# Patient Record
Sex: Male | Born: 1935 | Race: White | Hispanic: No | Marital: Married
Health system: Southern US, Community
[De-identification: ages and names within clinical notes are randomized; demographics above are authoritative.]

## PROBLEM LIST (undated history)

## (undated) DIAGNOSIS — N400 Enlarged prostate without lower urinary tract symptoms: Secondary | ICD-10-CM

## (undated) DIAGNOSIS — I10 Essential (primary) hypertension: Secondary | ICD-10-CM

## (undated) DIAGNOSIS — F0390 Unspecified dementia without behavioral disturbance: Secondary | ICD-10-CM

## (undated) DIAGNOSIS — E785 Hyperlipidemia, unspecified: Secondary | ICD-10-CM

## (undated) DIAGNOSIS — M1990 Unspecified osteoarthritis, unspecified site: Secondary | ICD-10-CM

## (undated) DIAGNOSIS — I2510 Atherosclerotic heart disease of native coronary artery without angina pectoris: Secondary | ICD-10-CM

## (undated) DIAGNOSIS — K219 Gastro-esophageal reflux disease without esophagitis: Secondary | ICD-10-CM

## (undated) DIAGNOSIS — R296 Repeated falls: Secondary | ICD-10-CM

## (undated) DIAGNOSIS — F32A Depression, unspecified: Secondary | ICD-10-CM

## (undated) DIAGNOSIS — R9430 Abnormal result of cardiovascular function study, unspecified: Secondary | ICD-10-CM

## (undated) DIAGNOSIS — R202 Paresthesia of skin: Secondary | ICD-10-CM

## (undated) DIAGNOSIS — R001 Bradycardia, unspecified: Secondary | ICD-10-CM

## (undated) DIAGNOSIS — L989 Disorder of the skin and subcutaneous tissue, unspecified: Secondary | ICD-10-CM

## (undated) DIAGNOSIS — H9190 Unspecified hearing loss, unspecified ear: Secondary | ICD-10-CM

## (undated) DIAGNOSIS — M5090 Cervical disc disorder, unspecified, unspecified cervical region: Secondary | ICD-10-CM

## (undated) DIAGNOSIS — I779 Disorder of arteries and arterioles, unspecified: Secondary | ICD-10-CM

## (undated) DIAGNOSIS — K2270 Barrett's esophagus without dysplasia: Secondary | ICD-10-CM

## (undated) DIAGNOSIS — S065XAA Traumatic subdural hemorrhage with loss of consciousness status unknown, initial encounter: Secondary | ICD-10-CM

## (undated) DIAGNOSIS — S065X9A Traumatic subdural hemorrhage with loss of consciousness of unspecified duration, initial encounter: Secondary | ICD-10-CM

## (undated) DIAGNOSIS — Z951 Presence of aortocoronary bypass graft: Secondary | ICD-10-CM

## (undated) DIAGNOSIS — Z9289 Personal history of other medical treatment: Secondary | ICD-10-CM

## (undated) DIAGNOSIS — R55 Syncope and collapse: Secondary | ICD-10-CM

## (undated) DIAGNOSIS — N419 Inflammatory disease of prostate, unspecified: Secondary | ICD-10-CM

## (undated) DIAGNOSIS — I739 Peripheral vascular disease, unspecified: Secondary | ICD-10-CM

## (undated) DIAGNOSIS — K5790 Diverticulosis of intestine, part unspecified, without perforation or abscess without bleeding: Secondary | ICD-10-CM

## (undated) DIAGNOSIS — W19XXXA Unspecified fall, initial encounter: Secondary | ICD-10-CM

## (undated) DIAGNOSIS — I440 Atrioventricular block, first degree: Secondary | ICD-10-CM

## (undated) DIAGNOSIS — L719 Rosacea, unspecified: Secondary | ICD-10-CM

## (undated) DIAGNOSIS — N320 Bladder-neck obstruction: Secondary | ICD-10-CM

## (undated) DIAGNOSIS — R0989 Other specified symptoms and signs involving the circulatory and respiratory systems: Secondary | ICD-10-CM

## (undated) DIAGNOSIS — S060X9A Concussion with loss of consciousness of unspecified duration, initial encounter: Secondary | ICD-10-CM

## (undated) DIAGNOSIS — Y92009 Unspecified place in unspecified non-institutional (private) residence as the place of occurrence of the external cause: Secondary | ICD-10-CM

## (undated) DIAGNOSIS — F329 Major depressive disorder, single episode, unspecified: Secondary | ICD-10-CM

## (undated) HISTORY — PX: OTHER SURGICAL HISTORY: SHX169

## (undated) HISTORY — DX: Depression, unspecified: F32.A

## (undated) HISTORY — DX: Peripheral vascular disease, unspecified: I73.9

## (undated) HISTORY — DX: Other specified symptoms and signs involving the circulatory and respiratory systems: R09.89

## (undated) HISTORY — DX: Disorder of arteries and arterioles, unspecified: I77.9

## (undated) HISTORY — DX: Diverticulosis of intestine, part unspecified, without perforation or abscess without bleeding: K57.90

## (undated) HISTORY — DX: Disorder of the skin and subcutaneous tissue, unspecified: L98.9

## (undated) HISTORY — DX: Traumatic subdural hemorrhage with loss of consciousness of unspecified duration, initial encounter: S06.5X9A

## (undated) HISTORY — DX: Rosacea, unspecified: L71.9

## (undated) HISTORY — DX: Benign prostatic hyperplasia without lower urinary tract symptoms: N40.0

## (undated) HISTORY — DX: Barrett's esophagus without dysplasia: K22.70

## (undated) HISTORY — DX: Concussion with loss of consciousness of unspecified duration, initial encounter: S06.0X9A

## (undated) HISTORY — PX: LEG SURGERY: SHX1003

## (undated) HISTORY — DX: Unspecified fall, initial encounter: W19.XXXA

## (undated) HISTORY — PX: CORONARY ANGIOPLASTY WITH STENT PLACEMENT: SHX49

## (undated) HISTORY — DX: Hyperlipidemia, unspecified: E78.5

## (undated) HISTORY — PX: APPENDECTOMY: SHX54

## (undated) HISTORY — PX: TONSILLECTOMY: SUR1361

## (undated) HISTORY — DX: Unspecified osteoarthritis, unspecified site: M19.90

## (undated) HISTORY — DX: Unspecified place in unspecified non-institutional (private) residence as the place of occurrence of the external cause: Y92.009

## (undated) HISTORY — DX: Unspecified hearing loss, unspecified ear: H91.90

## (undated) HISTORY — DX: Personal history of other medical treatment: Z92.89

## (undated) HISTORY — DX: Traumatic subdural hemorrhage with loss of consciousness status unknown, initial encounter: S06.5XAA

## (undated) HISTORY — DX: Atherosclerotic heart disease of native coronary artery without angina pectoris: I25.10

## (undated) HISTORY — DX: Syncope and collapse: R55

## (undated) HISTORY — DX: Presence of aortocoronary bypass graft: Z95.1

## (undated) HISTORY — DX: Inflammatory disease of prostate, unspecified: N41.9

## (undated) HISTORY — DX: Gastro-esophageal reflux disease without esophagitis: K21.9

## (undated) HISTORY — DX: Paresthesia of skin: R20.2

## (undated) HISTORY — DX: Abnormal result of cardiovascular function study, unspecified: R94.30

## (undated) HISTORY — DX: Major depressive disorder, single episode, unspecified: F32.9

## (undated) HISTORY — DX: Bladder-neck obstruction: N32.0

---

## 1990-04-12 HISTORY — PX: CORONARY ARTERY BYPASS GRAFT: SHX141

## 1995-04-13 HISTORY — PX: COCCYX FRACTURE SURGERY: SHX599

## 1998-07-24 ENCOUNTER — Encounter: Payer: Self-pay | Admitting: Internal Medicine

## 1998-07-24 ENCOUNTER — Ambulatory Visit (HOSPITAL_COMMUNITY): Admission: RE | Admit: 1998-07-24 | Discharge: 1998-07-24 | Payer: Self-pay | Admitting: Internal Medicine

## 1998-10-22 ENCOUNTER — Encounter (INDEPENDENT_AMBULATORY_CARE_PROVIDER_SITE_OTHER): Payer: Self-pay | Admitting: Specialist

## 1998-10-22 ENCOUNTER — Other Ambulatory Visit: Admission: RE | Admit: 1998-10-22 | Discharge: 1998-10-22 | Payer: Self-pay | Admitting: Gastroenterology

## 1998-10-27 ENCOUNTER — Encounter: Payer: Self-pay | Admitting: Gastroenterology

## 1998-10-27 ENCOUNTER — Ambulatory Visit (HOSPITAL_COMMUNITY): Admission: RE | Admit: 1998-10-27 | Discharge: 1998-10-27 | Payer: Self-pay | Admitting: Gastroenterology

## 1999-06-15 ENCOUNTER — Encounter: Payer: Self-pay | Admitting: Cardiology

## 1999-07-20 ENCOUNTER — Ambulatory Visit: Admission: RE | Admit: 1999-07-20 | Discharge: 1999-07-20 | Payer: Self-pay | Admitting: Pulmonary Disease

## 2002-02-17 ENCOUNTER — Encounter: Payer: Self-pay | Admitting: Specialist

## 2002-02-17 ENCOUNTER — Encounter: Payer: Self-pay | Admitting: Emergency Medicine

## 2002-02-18 ENCOUNTER — Inpatient Hospital Stay (HOSPITAL_COMMUNITY): Admission: EM | Admit: 2002-02-18 | Discharge: 2002-02-20 | Payer: Self-pay | Admitting: Emergency Medicine

## 2002-11-19 ENCOUNTER — Ambulatory Visit (HOSPITAL_COMMUNITY): Admission: RE | Admit: 2002-11-19 | Discharge: 2002-11-19 | Payer: Self-pay | Admitting: Specialist

## 2002-11-26 LAB — HM COLONOSCOPY

## 2004-02-24 ENCOUNTER — Ambulatory Visit (HOSPITAL_COMMUNITY): Admission: RE | Admit: 2004-02-24 | Discharge: 2004-02-24 | Payer: Self-pay | Admitting: Chiropractic Medicine

## 2004-03-02 ENCOUNTER — Ambulatory Visit: Payer: Self-pay | Admitting: Pulmonary Disease

## 2004-03-11 ENCOUNTER — Ambulatory Visit: Payer: Self-pay | Admitting: Internal Medicine

## 2004-04-08 ENCOUNTER — Ambulatory Visit: Payer: Self-pay | Admitting: Internal Medicine

## 2004-10-05 ENCOUNTER — Ambulatory Visit: Payer: Self-pay | Admitting: Internal Medicine

## 2004-10-15 ENCOUNTER — Ambulatory Visit: Payer: Self-pay

## 2004-10-27 ENCOUNTER — Ambulatory Visit: Payer: Self-pay | Admitting: Cardiology

## 2004-11-03 ENCOUNTER — Ambulatory Visit: Payer: Self-pay

## 2004-11-19 ENCOUNTER — Ambulatory Visit: Payer: Self-pay | Admitting: Internal Medicine

## 2005-04-02 ENCOUNTER — Ambulatory Visit: Payer: Self-pay | Admitting: Pulmonary Disease

## 2005-07-12 ENCOUNTER — Inpatient Hospital Stay (HOSPITAL_COMMUNITY): Admission: EM | Admit: 2005-07-12 | Discharge: 2005-07-15 | Payer: Self-pay | Admitting: Emergency Medicine

## 2005-07-12 ENCOUNTER — Ambulatory Visit: Payer: Self-pay | Admitting: Cardiology

## 2005-07-13 ENCOUNTER — Encounter: Payer: Self-pay | Admitting: Cardiology

## 2005-08-09 ENCOUNTER — Ambulatory Visit: Payer: Self-pay | Admitting: Cardiology

## 2005-09-21 ENCOUNTER — Ambulatory Visit: Payer: Self-pay | Admitting: Cardiology

## 2005-11-10 ENCOUNTER — Ambulatory Visit: Payer: Self-pay | Admitting: Cardiology

## 2006-03-08 ENCOUNTER — Ambulatory Visit: Payer: Self-pay | Admitting: Cardiology

## 2006-03-12 HISTORY — PX: OTHER SURGICAL HISTORY: SHX169

## 2006-03-22 ENCOUNTER — Ambulatory Visit (HOSPITAL_COMMUNITY): Admission: RE | Admit: 2006-03-22 | Discharge: 2006-03-23 | Payer: Self-pay | Admitting: Neurosurgery

## 2006-04-15 ENCOUNTER — Ambulatory Visit: Payer: Self-pay | Admitting: Internal Medicine

## 2006-05-03 ENCOUNTER — Ambulatory Visit: Payer: Self-pay | Admitting: Cardiology

## 2006-05-03 ENCOUNTER — Ambulatory Visit: Payer: Self-pay | Admitting: Internal Medicine

## 2006-05-03 LAB — CONVERTED CEMR LAB
Cholesterol: 127 mg/dL (ref 0–200)
HDL: 32.2 mg/dL — ABNORMAL LOW (ref >39.0)
LDL Cholesterol: 72 mg/dL (ref 0–99)
PSA: 0.95 ng/mL (ref 0.10–4.00)
Total CHOL/HDL Ratio: 3.9
Triglycerides: 114 mg/dL (ref 0–149)
VLDL: 23 mg/dL (ref 0–40)

## 2006-05-12 ENCOUNTER — Encounter: Admission: RE | Admit: 2006-05-12 | Discharge: 2006-05-12 | Payer: Self-pay | Admitting: Neurosurgery

## 2006-09-02 ENCOUNTER — Ambulatory Visit: Payer: Self-pay | Admitting: Cardiology

## 2007-03-01 ENCOUNTER — Ambulatory Visit: Payer: Self-pay | Admitting: Cardiology

## 2007-03-01 LAB — CONVERTED CEMR LAB
Cholesterol: 147 mg/dL (ref 0–200)
HDL: 32.3 mg/dL — ABNORMAL LOW (ref >39.0)
LDL Cholesterol: 91 mg/dL (ref 0–99)
Total CHOL/HDL Ratio: 4.6
Triglycerides: 118 mg/dL (ref 0–149)
VLDL: 24 mg/dL (ref 0–40)

## 2007-05-23 ENCOUNTER — Emergency Department (HOSPITAL_COMMUNITY): Admission: EM | Admit: 2007-05-23 | Discharge: 2007-05-23 | Payer: Self-pay | Admitting: Emergency Medicine

## 2007-06-02 ENCOUNTER — Ambulatory Visit: Payer: Self-pay | Admitting: Internal Medicine

## 2007-06-02 DIAGNOSIS — E785 Hyperlipidemia, unspecified: Secondary | ICD-10-CM | POA: Insufficient documentation

## 2007-06-02 DIAGNOSIS — S0100XA Unspecified open wound of scalp, initial encounter: Secondary | ICD-10-CM | POA: Insufficient documentation

## 2007-06-02 DIAGNOSIS — K2270 Barrett's esophagus without dysplasia: Secondary | ICD-10-CM

## 2007-06-02 DIAGNOSIS — E739 Lactose intolerance, unspecified: Secondary | ICD-10-CM | POA: Insufficient documentation

## 2007-06-02 DIAGNOSIS — L989 Disorder of the skin and subcutaneous tissue, unspecified: Secondary | ICD-10-CM

## 2007-06-02 DIAGNOSIS — R5381 Other malaise: Secondary | ICD-10-CM | POA: Insufficient documentation

## 2007-06-02 DIAGNOSIS — R5383 Other fatigue: Secondary | ICD-10-CM

## 2007-06-02 DIAGNOSIS — L719 Rosacea, unspecified: Secondary | ICD-10-CM | POA: Insufficient documentation

## 2007-06-02 HISTORY — DX: Barrett's esophagus without dysplasia: K22.70

## 2007-06-02 HISTORY — DX: Disorder of the skin and subcutaneous tissue, unspecified: L98.9

## 2007-06-02 LAB — CONVERTED CEMR LAB
ALT: 23 units/L (ref 0–53)
AST: 20 units/L (ref 0–37)
Albumin: 4.1 g/dL (ref 3.5–5.2)
Alkaline Phosphatase: 45 units/L (ref 39–117)
BUN: 16 mg/dL (ref 6–23)
Basophils Absolute: 0.1 10*3/uL (ref 0.0–0.1)
Basophils Relative: 1 % (ref 0.0–1.0)
Bilirubin, Direct: 0.2 mg/dL (ref 0.0–0.3)
CO2: 31 meq/L (ref 19–32)
Calcium: 9.2 mg/dL (ref 8.4–10.5)
Chloride: 104 meq/L (ref 96–112)
Cholesterol: 139 mg/dL (ref 0–200)
Creatinine, Ser: 1.3 mg/dL (ref 0.4–1.5)
Eosinophils Absolute: 0.2 10*3/uL (ref 0.0–0.6)
Eosinophils Relative: 3 % (ref 0.0–5.0)
GFR calc Af Amer: 70 mL/min
GFR calc non Af Amer: 58 mL/min
Glucose, Bld: 146 mg/dL — ABNORMAL HIGH (ref 70–99)
HCT: 48 % (ref 39.0–52.0)
HDL: 25.4 mg/dL — ABNORMAL LOW (ref >39.0)
Hemoglobin: 16.1 g/dL (ref 13.0–17.0)
Hgb A1c MFr Bld: 6.7 % — ABNORMAL HIGH (ref 4.6–6.0)
LDL Cholesterol: 85 mg/dL (ref 0–99)
Lymphocytes Relative: 31.6 % (ref 12.0–46.0)
MCHC: 33.7 g/dL (ref 30.0–36.0)
MCV: 95.4 fL (ref 78.0–100.0)
Monocytes Absolute: 0.6 10*3/uL (ref 0.2–0.7)
Monocytes Relative: 7.1 % (ref 3.0–11.0)
Neutro Abs: 4.7 10*3/uL (ref 1.4–7.7)
Neutrophils Relative %: 57.3 % (ref 43.0–77.0)
PSA: 1.48 ng/mL (ref 0.10–4.00)
Platelets: 184 10*3/uL (ref 150–400)
Potassium: 4.2 meq/L (ref 3.5–5.1)
RBC: 5.03 M/uL (ref 4.22–5.81)
RDW: 12.5 % (ref 11.5–14.6)
Sodium: 140 meq/L (ref 135–145)
TSH: 1.55 microintl units/mL (ref 0.35–5.50)
Total Bilirubin: 1 mg/dL (ref 0.3–1.2)
Total CHOL/HDL Ratio: 5.5
Total Protein: 6.9 g/dL (ref 6.0–8.3)
Triglycerides: 144 mg/dL (ref 0–149)
VLDL: 29 mg/dL (ref 0–40)
WBC: 8.2 10*3/uL (ref 4.5–10.5)

## 2007-06-04 DIAGNOSIS — N400 Enlarged prostate without lower urinary tract symptoms: Secondary | ICD-10-CM | POA: Insufficient documentation

## 2007-06-04 DIAGNOSIS — K219 Gastro-esophageal reflux disease without esophagitis: Secondary | ICD-10-CM | POA: Insufficient documentation

## 2007-06-04 DIAGNOSIS — J45909 Unspecified asthma, uncomplicated: Secondary | ICD-10-CM | POA: Insufficient documentation

## 2007-06-04 DIAGNOSIS — F329 Major depressive disorder, single episode, unspecified: Secondary | ICD-10-CM | POA: Insufficient documentation

## 2007-06-04 DIAGNOSIS — F32A Depression, unspecified: Secondary | ICD-10-CM | POA: Insufficient documentation

## 2007-06-28 ENCOUNTER — Ambulatory Visit: Payer: Self-pay | Admitting: Internal Medicine

## 2007-07-03 ENCOUNTER — Telehealth (INDEPENDENT_AMBULATORY_CARE_PROVIDER_SITE_OTHER): Payer: Self-pay | Admitting: *Deleted

## 2007-07-04 ENCOUNTER — Ambulatory Visit: Payer: Self-pay | Admitting: Internal Medicine

## 2007-07-04 DIAGNOSIS — J209 Acute bronchitis, unspecified: Secondary | ICD-10-CM | POA: Insufficient documentation

## 2007-08-15 ENCOUNTER — Encounter: Payer: Self-pay | Admitting: Internal Medicine

## 2007-09-08 ENCOUNTER — Ambulatory Visit: Payer: Self-pay | Admitting: Cardiology

## 2008-03-28 ENCOUNTER — Encounter: Payer: Self-pay | Admitting: Cardiology

## 2008-03-29 ENCOUNTER — Ambulatory Visit: Payer: Self-pay | Admitting: Cardiology

## 2008-05-08 ENCOUNTER — Ambulatory Visit: Payer: Self-pay | Admitting: Cardiology

## 2008-05-08 LAB — CONVERTED CEMR LAB
ALT: 18 units/L (ref 0–53)
AST: 20 units/L (ref 0–37)
Albumin: 3.9 g/dL (ref 3.5–5.2)
Alkaline Phosphatase: 44 units/L (ref 39–117)
Bilirubin, Direct: 0.2 mg/dL (ref 0.0–0.3)
Cholesterol: 205 mg/dL (ref 0–200)
Direct LDL: 150.5 mg/dL
HDL: 28.6 mg/dL — ABNORMAL LOW (ref >39.0)
Total Bilirubin: 1.2 mg/dL (ref 0.3–1.2)
Total CHOL/HDL Ratio: 7.2
Total Protein: 7 g/dL (ref 6.0–8.3)
Triglycerides: 163 mg/dL — ABNORMAL HIGH (ref 0–149)
VLDL: 33 mg/dL (ref 0–40)

## 2008-07-15 ENCOUNTER — Ambulatory Visit: Payer: Self-pay | Admitting: Cardiology

## 2008-07-15 LAB — CONVERTED CEMR LAB
ALT: 22 units/L (ref 0–53)
AST: 24 units/L (ref 0–37)
Albumin: 4 g/dL (ref 3.5–5.2)
Alkaline Phosphatase: 39 units/L (ref 39–117)
Bilirubin, Direct: 0.2 mg/dL (ref 0.0–0.3)
Cholesterol: 139 mg/dL (ref 0–200)
HDL: 25.3 mg/dL — ABNORMAL LOW (ref >39.00)
LDL Cholesterol: 87 mg/dL (ref 0–99)
Total Bilirubin: 1.2 mg/dL (ref 0.3–1.2)
Total CHOL/HDL Ratio: 5
Total Protein: 6.9 g/dL (ref 6.0–8.3)
Triglycerides: 135 mg/dL (ref 0.0–149.0)
VLDL: 27 mg/dL (ref 0.0–40.0)

## 2008-08-07 ENCOUNTER — Telehealth: Payer: Self-pay | Admitting: Cardiology

## 2008-09-16 ENCOUNTER — Telehealth: Payer: Self-pay | Admitting: Internal Medicine

## 2008-09-18 ENCOUNTER — Encounter: Payer: Self-pay | Admitting: Internal Medicine

## 2008-09-19 ENCOUNTER — Ambulatory Visit: Payer: Self-pay | Admitting: Internal Medicine

## 2008-09-19 ENCOUNTER — Telehealth (INDEPENDENT_AMBULATORY_CARE_PROVIDER_SITE_OTHER): Payer: Self-pay | Admitting: *Deleted

## 2008-09-19 LAB — CONVERTED CEMR LAB
ALT: 28 units/L (ref 0–53)
AST: 31 units/L (ref 0–37)
Albumin: 4.2 g/dL (ref 3.5–5.2)
Alkaline Phosphatase: 59 units/L (ref 39–117)
BUN: 17 mg/dL (ref 6–23)
Basophils Absolute: 0.4 10*3/uL — ABNORMAL HIGH (ref 0.0–0.1)
Basophils Relative: 3.8 % — ABNORMAL HIGH (ref 0.0–3.0)
Bilirubin, Direct: 0.2 mg/dL (ref 0.0–0.3)
CO2: 28 meq/L (ref 19–32)
Calcium: 9.1 mg/dL (ref 8.4–10.5)
Chloride: 106 meq/L (ref 96–112)
Cholesterol: 134 mg/dL (ref 0–200)
Creatinine, Ser: 1.2 mg/dL (ref 0.4–1.5)
Direct LDL: 87.6 mg/dL
Eosinophils Absolute: 0.6 10*3/uL (ref 0.0–0.7)
Eosinophils Relative: 5.7 % — ABNORMAL HIGH (ref 0.0–5.0)
GFR calc non Af Amer: 63.01 mL/min (ref >60)
Glucose, Bld: 134 mg/dL — ABNORMAL HIGH (ref 70–99)
HCT: 45.7 % (ref 39.0–52.0)
HDL: 29.5 mg/dL — ABNORMAL LOW (ref >39.00)
Hemoglobin: 16.2 g/dL (ref 13.0–17.0)
Hgb A1c MFr Bld: 6.8 % — ABNORMAL HIGH (ref 4.6–6.5)
Lymphocytes Relative: 39.9 % (ref 12.0–46.0)
Lymphs Abs: 4.1 10*3/uL — ABNORMAL HIGH (ref 0.7–4.0)
MCHC: 35.4 g/dL (ref 30.0–36.0)
MCV: 97.6 fL (ref 78.0–100.0)
Monocytes Absolute: 0.8 10*3/uL (ref 0.1–1.0)
Monocytes Relative: 8.1 % (ref 3.0–12.0)
Neutro Abs: 4.5 10*3/uL (ref 1.4–7.7)
Neutrophils Relative %: 42.5 % — ABNORMAL LOW (ref 43.0–77.0)
PSA: 1.23 ng/mL (ref 0.10–4.00)
Platelets: 189 10*3/uL (ref 150.0–400.0)
Potassium: 3.8 meq/L (ref 3.5–5.1)
RBC: 4.68 M/uL (ref 4.22–5.81)
RDW: 12.3 % (ref 11.5–14.6)
Sodium: 143 meq/L (ref 135–145)
TSH: 1.74 microintl units/mL (ref 0.35–5.50)
Total Bilirubin: 0.9 mg/dL (ref 0.3–1.2)
Total CHOL/HDL Ratio: 5
Total Protein: 7.4 g/dL (ref 6.0–8.3)
Triglycerides: 290 mg/dL — ABNORMAL HIGH (ref 0.0–149.0)
VLDL: 58 mg/dL — ABNORMAL HIGH (ref 0.0–40.0)
WBC: 10.4 10*3/uL (ref 4.5–10.5)

## 2008-10-24 ENCOUNTER — Ambulatory Visit: Payer: Self-pay | Admitting: Cardiology

## 2008-12-20 ENCOUNTER — Encounter: Admission: RE | Admit: 2008-12-20 | Discharge: 2008-12-20 | Payer: Self-pay | Admitting: Neurosurgery

## 2008-12-20 ENCOUNTER — Encounter: Payer: Self-pay | Admitting: Internal Medicine

## 2009-01-01 ENCOUNTER — Encounter: Payer: Self-pay | Admitting: Cardiology

## 2009-01-10 HISTORY — PX: OTHER SURGICAL HISTORY: SHX169

## 2009-01-13 ENCOUNTER — Inpatient Hospital Stay (HOSPITAL_COMMUNITY): Admission: RE | Admit: 2009-01-13 | Discharge: 2009-01-14 | Payer: Self-pay | Admitting: Neurosurgery

## 2009-03-13 ENCOUNTER — Encounter: Admission: RE | Admit: 2009-03-13 | Discharge: 2009-03-13 | Payer: Self-pay | Admitting: Neurosurgery

## 2009-10-02 ENCOUNTER — Ambulatory Visit: Payer: Self-pay | Admitting: Internal Medicine

## 2009-10-02 DIAGNOSIS — E119 Type 2 diabetes mellitus without complications: Secondary | ICD-10-CM | POA: Insufficient documentation

## 2009-10-03 ENCOUNTER — Encounter (INDEPENDENT_AMBULATORY_CARE_PROVIDER_SITE_OTHER): Payer: Self-pay | Admitting: *Deleted

## 2009-10-03 LAB — CONVERTED CEMR LAB
ALT: 20 units/L (ref 0–53)
AST: 21 units/L (ref 0–37)
Albumin: 4.2 g/dL (ref 3.5–5.2)
Alkaline Phosphatase: 57 units/L (ref 39–117)
BUN: 16 mg/dL (ref 6–23)
Basophils Absolute: 0.1 10*3/uL (ref 0.0–0.1)
Basophils Relative: 0.6 % (ref 0.0–3.0)
Bilirubin Urine: NEGATIVE
Bilirubin, Direct: 0.1 mg/dL (ref 0.0–0.3)
CO2: 29 meq/L (ref 19–32)
Calcium: 9 mg/dL (ref 8.4–10.5)
Chloride: 107 meq/L (ref 96–112)
Cholesterol: 134 mg/dL (ref 0–200)
Creatinine, Ser: 1.1 mg/dL (ref 0.4–1.5)
Eosinophils Absolute: 0.4 10*3/uL (ref 0.0–0.7)
Eosinophils Relative: 4.1 % (ref 0.0–5.0)
Folate: 6.5 ng/mL
GFR calc non Af Amer: 68.04 mL/min (ref >60)
Glucose, Bld: 131 mg/dL — ABNORMAL HIGH (ref 70–99)
HCT: 46.1 % (ref 39.0–52.0)
HDL: 33.6 mg/dL — ABNORMAL LOW (ref >39.00)
Hemoglobin, Urine: NEGATIVE
Hemoglobin: 15.9 g/dL (ref 13.0–17.0)
Hgb A1c MFr Bld: 6.9 % — ABNORMAL HIGH (ref 4.6–6.5)
Iron: 95 ug/dL (ref 42–165)
Ketones, ur: NEGATIVE mg/dL
LDL Cholesterol: 74 mg/dL (ref 0–99)
Leukocytes, UA: NEGATIVE
Lymphocytes Relative: 39.1 % (ref 12.0–46.0)
Lymphs Abs: 3.8 10*3/uL (ref 0.7–4.0)
MCHC: 34.4 g/dL (ref 30.0–36.0)
MCV: 98.3 fL (ref 78.0–100.0)
Monocytes Absolute: 0.8 10*3/uL (ref 0.1–1.0)
Monocytes Relative: 8.3 % (ref 3.0–12.0)
Neutro Abs: 4.7 10*3/uL (ref 1.4–7.7)
Neutrophils Relative %: 47.9 % (ref 43.0–77.0)
Nitrite: NEGATIVE
PSA: 1.23 ng/mL (ref 0.10–4.00)
Platelets: 194 10*3/uL (ref 150.0–400.0)
Potassium: 4.2 meq/L (ref 3.5–5.1)
RBC: 4.69 M/uL (ref 4.22–5.81)
RDW: 13.2 % (ref 11.5–14.6)
Saturation Ratios: 29.8 % (ref 20.0–50.0)
Sed Rate: 7 mm/hr (ref 0–22)
Sodium: 141 meq/L (ref 135–145)
Specific Gravity, Urine: 1.02 (ref 1.000–1.030)
TSH: 2.84 microintl units/mL (ref 0.35–5.50)
Total Bilirubin: 0.9 mg/dL (ref 0.3–1.2)
Total CHOL/HDL Ratio: 4
Total Protein, Urine: NEGATIVE mg/dL
Total Protein: 7.2 g/dL (ref 6.0–8.3)
Transferrin: 227.5 mg/dL (ref 212.0–360.0)
Triglycerides: 131 mg/dL (ref 0.0–149.0)
Urine Glucose: NEGATIVE mg/dL
Urobilinogen, UA: 0.2 (ref 0.0–1.0)
VLDL: 26.2 mg/dL (ref 0.0–40.0)
Vitamin B-12: 396 pg/mL (ref 211–911)
WBC: 9.8 10*3/uL (ref 4.5–10.5)
pH: 7 (ref 5.0–8.0)

## 2009-10-04 ENCOUNTER — Encounter: Payer: Self-pay | Admitting: Internal Medicine

## 2009-10-05 LAB — CONVERTED CEMR LAB: Vit D, 25-Hydroxy: 21 ng/mL — ABNORMAL LOW (ref 30–89)

## 2009-10-13 ENCOUNTER — Encounter: Payer: Self-pay | Admitting: Cardiology

## 2009-10-14 ENCOUNTER — Ambulatory Visit: Payer: Self-pay | Admitting: Cardiology

## 2009-10-20 ENCOUNTER — Telehealth: Payer: Self-pay | Admitting: Internal Medicine

## 2009-10-20 ENCOUNTER — Encounter: Payer: Self-pay | Admitting: Internal Medicine

## 2010-04-16 ENCOUNTER — Telehealth: Payer: Self-pay | Admitting: Cardiology

## 2010-05-12 NOTE — Progress Notes (Signed)
Summary: nexium prior authorization -  Phone Note Call from Patient Call back at Home Phone [phone]   Caller: Patient Call For: Corwin Levins MD Summary of Call: [facility] faxed over prior authorization forms for pt  nexium faed forms back to  [phone] September 19, 2008 3:44 PM .....wwaiting on approval...letter  Initial call taken by: Shelbie Proctor,  September 19, 2008 3:44 PM

## 2010-05-12 NOTE — Assessment & Plan Note (Signed)
Summary: f1y   Visit Type:  Follow-up Primary Provider:  Oliver Barre, MD  CC:  CAD.  History of Present Illness: The patient is seen for followup of coronary disease.  He has not had any recurrent chest pain.  He is doing well.  He is post CABG.  He then had a Taxus stent to the native LAD after the LIMA insertion in April, 2007.  He's not had any significant symptoms since then.  There is been no exercise testing since then.  He does have normal left ventricular function.  His lipids are being treated.  Current Medications (verified): 1)  Flomax 0.4 Mg  Cp24 (Tamsulosin Hcl) .... Take 1 By Mouth Once Daily 2)  Nexium 40 Mg  Cpdr (Esomeprazole Magnesium) .... Take 1 By Mouth Once Daily 3)  Pravachol 40 Mg  Tabs (Pravastatin Sodium) .Marland Kitchen.. 1 By Mouth Qd 4)  Metoprolol Tartrate 25 Mg Tabs (Metoprolol Tartrate) .... Take 1/2 Tablet By Mouth Twice A Day 5)  Ecotrin Low Strength 81 Mg  Tbec (Aspirin) .Marland Kitchen.. 1 By Mouth Qd 6)  Niaspan 1000 Mg Cr-Tabs (Niacin (Antihyperlipidemic)) .Marland Kitchen.. 1 By Mouth Once Daily 7)  Claritin-D 24 Hour 10-240 Mg Xr24h-Tab (Loratadine-Pseudoephedrine) .Marland Kitchen.. 1 By Mouth Once Daily As Needed For Allergies 8)  Vitamin D3 2000 Unit Caps (Cholecalciferol) .... Once Daily  Allergies (verified): 1)  ! * Crestor 2)  ! Zocor 3)  ! * Vytorin 4)  ! Codeine  Past History:  Past Medical History: CABG in 1992... CAD.....nuclear 2006. /   .Taxus stent native LAD after the LIMA insertion .Marland KitchenMarland KitchenApril 2007.... through the internal mammary artery....  Dr Katz/cardiology EF  echo..2001... /  cath  2007..60% Myocardial infarction, hx of Hyperlipidemia.Marland Kitchenintolerance to Crestor Zocor Vytorin. He tolerates Pravachol..low HDL GERD Barrett's changes in the esophagus/erosive esophagitis rosacea hx of prostatitis Depression Benign prostatic hypertrophy Asthma  /asthmatic bronchitis coccyx bone removal surgery Paresthesias... successful surgery by Dr. Julio Sicks Arthritis Diabetes mellitus,  type II - diet  Review of Systems       Patient denies fever, chills, headache, sweats, rash change in vision, change in hearing, chest pain, cough, nausea vomiting, urinary symptoms.  All of the systems are reviewed and are negative.  Vital Signs:  Patient profile:   77 year old male Height:      72 inches Weight:      228 pounds BMI:     31.03 Pulse rate:   69 / minute BP sitting:   132 / 76  (left arm) Cuff size:   regular  Vitals Entered By: Hardin Negus, RMA (October 14, 2009 11:01 AM)  Physical Exam  General:  patient is stable in general. Head:  head is atraumatic. Eyes:  no xanthelasma. Neck:  no jugular venous distention. Chest Wall:  no chest wall tenderness. Lungs:  lungs are clear cardiorespiratory effort is nonlabored. Heart:  cardiac exam reveals S1-S2.  No clicks or significant murmurs or Abdomen:  abdomen is soft. Msk:  no musculoskeletal deformities. Extremities:  no peripheral edema. Skin:  no skin rashes. Psych:  patient is oriented to person time and place.  Affect is normal.   Impression & Recommendations:  Problem # 1:  HYPERLIPIDEMIA (ICD-272.4)  His updated medication list for this problem includes:    Pravachol 40 Mg Tabs (Pravastatin sodium) .Marland Kitchen... 1 by mouth qd    Niaspan 1000 Mg Cr-tabs (Niacin (antihyperlipidemic)) .Marland Kitchen... 1 by mouth once daily The patient's recent labs are reviewed.  His LDL is 74  and HDL 33.  Triglycerides are 130.  The patient is on a statin and Niaspan.  Because he has tolerated Niaspan at a dose chosen to keep him on this.  It does help with his triglycerides.  Problem # 2:  CORONARY ARTERY BYPASS GRAFT, HX OF (ICD-V45.81)  Orders: EKG w/ Interpretation (93000) ICoronary disease is stable.  EKG is done today and reviewed by me.  There is first-degree AV block.  EKG otherwise normal.  Problem # 3:  GERD (ICD-530.81)  His updated medication list for this problem includes:    Nexium 40 Mg Cpdr (Esomeprazole magnesium)  .Marland Kitchen... Take 1 by mouth once daily The patient's symptoms are under good control.  No change in therapy.  Followup in one year.  Patient Instructions: 1)  Your physician wants you to follow-up in:  1 year.  You will receive a reminder letter in the mail two months in advance. If you don't receive a letter, please call our office to schedule the follow-up appointment.

## 2010-05-12 NOTE — Medication Information (Signed)
Summary: Approval for Nexium/[facility]  Approval for Nexium/[employer]   Imported By: Esmeralda Links D'jimraou 08/17/2007 14:31:27  _____________________________________________________________________  External Attachment:    Type:   Image     Comment:   External Document

## 2010-05-12 NOTE — Letter (Signed)
Summary: Approved Nexium / [facility]  Approved Nexium / [facility]   Imported By: Lennie Odor 11/13/2009 10:53:10  _____________________________________________________________________  External Attachment:    Type:   Image     Comment:   External Document

## 2010-05-12 NOTE — Progress Notes (Signed)
Summary: Nexium PA  Phone Note From Pharmacy   Summary of Call: PA request--Nexium. Completed via [email or web address] and approved until 2012. Pharmacy notified. Initial call taken by: Lucious Groves,  October 20, 2009 10:28 AM  Follow-up for Phone Call        noted Follow-up by: Corwin Levins MD,  October 20, 2009 1:12 PM

## 2010-05-12 NOTE — Assessment & Plan Note (Signed)
Summary: FU--PRESCRIP REVIEW PER PT--STC   Vital Signs:  Patient profile:   77 year old male Height:      72 inches Weight:      228.38 pounds BMI:     31.09 O2 Sat:      94 % on Room air Temp:     98.2 degrees F oral Pulse rate:   63 / minute BP sitting:   128 / 62  (left arm) Cuff size:   large  Vitals Entered By: Zella Ball Ewing CMA Duncan Dull) (October 02, 2009 1:28 PM)  O2 Flow:  Room air  CC: Medication Refills/RE   Primary Care Provider:  Oliver Barre, MD  CC:  Medication Refills/RE.  History of Present Illness: here to f/u - overall doing well, no specific complaints;  Pt denies CP, sob, doe, wheezing, orthopnea, pnd, worsening LE edema, palps, dizziness or syncope  Pt denies new neuro symptoms such as headache, facial or extremity weakness   Pt denies polydipsia, polyuria, or low sugar symptoms such as shakiness improved with eating.  Overall good compliance with meds, trying to follow low chol, DM diet, wt stable, little excercise however  Denies worsening depressive symptoms or suicidal ideation  Here for wellness Diet: Heart Healthy or DM if diabetic Physical Activities: Sedentary Depression/mood screen: minor per pt - declines tx Hearing: mild decreased bilateral Visual Acuity: Grossly normal, gets exam every 1-2 yrs, wears one contact ADL's: Capable  Fall Risk: None Home Safety: Good Cognitive Impairment:  Gen appearance, affect, speech, memory, attention & motor skills grossly intact End-of-Life Planning: Advance directive - Full code/I agree   Preventive Screening-Counseling & Management      Drug Use:  no.    Problems Prior to Update: 1)  Diabetes Mellitus, Type II  (ICD-250.00) 2)  Coronary Artery Bypass Graft, Hx of  (ICD-V45.81) 3)  Asthmatic Bronchitis, Acute  (ICD-466.0) 4)  Laceration, Scalp  (ICD-873.0) 5)  Glucose Intolerance  (ICD-271.3) 6)  Asthma  (ICD-493.90) 7)  Benign Prostatic Hypertrophy  (ICD-600.00) 8)  Depression  (ICD-311) 9)  Rosacea   (ICD-695.3) 10)  Barretts Esophagus  (ICD-530.85) 11)  Gerd  (ICD-530.81) 12)  Glucose Intolerance  (ICD-271.3) 13)  Special Screening Malignant Neoplasm of Prostate  (ICD-V76.44) 14)  Skin Lesion  (ICD-709.9) 15)  Hyperlipidemia  (ICD-272.4) 16)  Myocardial Infarction, Hx of  (ICD-412) 17)  Coronary Artery Disease  (ICD-414.00) 18)  Fatigue  (ICD-780.79)  Medications Prior to Update: 1)  Flomax 0.4 Mg  Cp24 (Tamsulosin Hcl) .... Take 1 By Mouth Once Daily 2)  Nexium 40 Mg  Cpdr (Esomeprazole Magnesium) .... Take 1 By Mouth Once Daily 3)  Pravachol 40 Mg  Tabs (Pravastatin Sodium) .Marland Kitchen.. 1 By Mouth Qd 4)  Metoprolol Succinate 25 Mg Xr24h-Tab (Metoprolol Succinate) .... Take A Half  Tablet By Mouth Two Times A Day 5)  Ecotrin Low Strength 81 Mg  Tbec (Aspirin) .Marland Kitchen.. 1 By Mouth Qd 6)  Niaspan 1000 Mg Cr-Tabs (Niacin (Antihyperlipidemic)) .Marland Kitchen.. 1 By Mouth Once Daily 7)  Claritin-D 24 Hour 10-240 Mg Xr24h-Tab (Loratadine-Pseudoephedrine) .Marland Kitchen.. 1 By Mouth Once Daily As Needed For Allergies  Current Medications (verified): 1)  Flomax 0.4 Mg  Cp24 (Tamsulosin Hcl) .... Take 1 By Mouth Once Daily 2)  Nexium 40 Mg  Cpdr (Esomeprazole Magnesium) .... Take 1 By Mouth Once Daily 3)  Pravachol 40 Mg  Tabs (Pravastatin Sodium) .Marland Kitchen.. 1 By Mouth Qd 4)  Metoprolol Succinate 25 Mg Xr24h-Tab (Metoprolol Succinate) .... Take A Half  Tablet  By Mouth Two Times A Day 5)  Ecotrin Low Strength 81 Mg  Tbec (Aspirin) .Marland Kitchen.. 1 By Mouth Qd 6)  Niaspan 1000 Mg Cr-Tabs (Niacin (Antihyperlipidemic)) .Marland Kitchen.. 1 By Mouth Once Daily 7)  Claritin-D 24 Hour 10-240 Mg Xr24h-Tab (Loratadine-Pseudoephedrine) .Marland Kitchen.. 1 By Mouth Once Daily As Needed For Allergies  Allergies (verified): 1)  ! * Crestor 2)  ! Zocor 3)  ! * Vytorin 4)  ! 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 done in April 2007 through the internal mammary artery..... last Myoview 2006   Dr Katz/cardiology Myocardial infarction, hx of Hyperlipidemia.Marland Kitchenintolerance to Crestor Zocor Vytorin. He tolerates Pravachol..low HDL GERD Barrett's changes in the esophagus/erosive esophagitis rosacea hx of prostatitis Depression Benign prostatic hypertrophy Asthma  /asthmatic bronchitis coccyx bone removal surgery Paresthesias... successful surgery by Dr. Julio Sicks Arthritis Diabetes mellitus, type II - diet  Past Surgical History: C-spine disc surgury - dr Dutch Quint - 12/07. also c 4-5 sugury 01/2009 s/p coronary stent Coronary artery bypass graft Appendectomy Tonsillectomy s/p coccyx surgury 1997  Social History: Reviewed history from 06/02/2007 and no changes required. 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         all otherwise negative per pt -  - excepg for mild fatigue, without OSA symtpoms, has had increased stress with  son's mental illness  Physical Exam  General:  alert and overweight-appearing.   Head:  normocephalic and atraumatic.   Eyes:  vision grossly intact, pupils equal, and pupils round.   Ears:  R ear normal and L ear normal.   Nose:  no external deformity and no nasal discharge.   Mouth:  no gingival abnormalities and pharynx pink and moist.   Neck:  supple and no masses.   Lungs:  normal respiratory effort and normal breath sounds.   Heart:  normal rate and regular rhythm.   Abdomen:  soft, non-tender, and normal bowel sounds.   Msk:  no joint tenderness and no joint swelling.   Extremities:  no edema, no erythema  Neurologic:  cranial nerves II-XII intact and strength normal in all extremities.   Skin:  color normal and no rashes.   Psych:  depressed affect and moderately anxious.     Impression & Recommendations:  Problem # 1:  Preventive Health Care (ICD-V70.0)  Overall doing well, age appropriate education and counseling updated and referral for appropriate preventive services done unless declined, immunizations up to date or declined, diet counseling done if overweight, urged to quit smoking if smokes , most recent labs reviewed and current ordered if appropriate, ecg  reviewed or declined (interpretation per ECG scanned in the EMR if done); information regarding Medicare Prevention requirements given if appropriate; speciality referrals updated as appropriate   Orders: First annual wellness visit with prevention plan  (Z6109)  Problem # 2:  DIABETES MELLITUS, TYPE II (ICD-250.00)  His updated medication list for this problem includes:    Ecotrin Low Strength 81 Mg Tbec (Aspirin) .Marland Kitchen... 1 by mouth qd Pt to cont DM diet, excercise, wt loss efforts; to check labs today   Labs Reviewed: Creat: 1.2 (09/19/2008)    Reviewed HgBA1c results: 6.8 (09/19/2008)  6.7 (06/02/2007)  Orders: TLB-A1C / Hgb A1C (Glycohemoglobin) (83036-A1C) TLB-Lipid Panel (80061-LIPID) TLB-BMP (Basic  Metabolic Panel-BMET) (80048-METABOL) stable overall by hx and exam, ok to continue meds/tx as is , Pt to cont DM diet, excercise, wt loss efforts; to check labs today   Problem # 3:  FATIGUE (ICD-780.79) exam benign, to check labs below; follow with expectant management  Orders: TLB-CBC Platelet - w/Differential (85025-CBCD) TLB-Hepatic/Liver Function Pnl (80076-HEPATIC) TLB-TSH (Thyroid Stimulating Hormone) (84443-TSH) TLB-Udip ONLY (81003-UDIP) T-Vitamin D (25-Hydroxy) (60454-09811) TLB-IBC Pnl (Iron/FE;Transferrin) (83550-IBC) TLB-B12 + Folate Pnl (91478_29562-Z30/QMV) TLB-Sedimentation Rate (ESR) (85652-ESR)  Problem # 4:  ASTHMA (ICD-493.90) stable overall by hx and exam, ok to continue meds/tx as is   Problem # 5:  DEPRESSION (ICD-311) mild increased but declines SSRI trial  Complete Medication List: 1)  Flomax 0.4 Mg Cp24 (Tamsulosin hcl) .... Take 1 by mouth once daily 2)  Nexium 40 Mg Cpdr (Esomeprazole magnesium) .... Take 1 by mouth once daily 3)  Pravachol 40 Mg Tabs (Pravastatin sodium) .Marland Kitchen.. 1 by mouth qd 4)  Metoprolol Succinate 25 Mg Xr24h-tab (Metoprolol succinate) .... Take a half  tablet by mouth two times a day 5)  Ecotrin Low Strength 81 Mg Tbec (Aspirin) .Marland Kitchen.. 1 by mouth qd 6)  Niaspan 1000 Mg Cr-tabs (Niacin (antihyperlipidemic)) .Marland Kitchen.. 1 by mouth once daily 7)  Claritin-d 24 Hour 10-240 Mg Xr24h-tab (Loratadine-pseudoephedrine) .Marland Kitchen.. 1 by mouth once daily as needed for allergies  Other Orders: TLB-PSA (Prostate Specific Antigen) (84153-PSA)  Patient Instructions: 1)  Continue all previous medications as before this visit  2)  Please go to the Lab in the basement for your blood and/or urine tests today 3)  Please schedule a follow-up appointment in 6 months to re-check the sugar and labs

## 2010-05-12 NOTE — Letter (Signed)
Summary: [facility] Surgical Clearance  [facility] Surgical Clearance   Imported By: Roderic Ovens 02/19/2009 13:22:14  _____________________________________________________________________  External Attachment:    Type:   Image     Comment:   External Document

## 2010-05-12 NOTE — Assessment & Plan Note (Signed)
Summary: f/u appt per pt/$50/cd   Vital Signs:  Patient profile:   77 year old male Height:      71.5 inches Weight:      232.13 pounds BMI:     32.04 O2 Sat:      93 % Temp:     97.5 degrees F oral Pulse rate:   72 / minute BP sitting:   128 / 82  (left arm) Cuff size:   large  Vitals Entered By: Windell Norfolk (September 19, 2008 2:59 PM)  CC: med refills   CC:  med refills.  History of Present Illness: overall very active  - walk 1 mile 3 x per wkl goes to gym once weekly for 90 min with the trainer; due for f/u lipids; no CP, sob, doe, wheezing, orthopnea, pnd, or LE edema   Problems Prior to Update: 1)  Asthmatic Bronchitis, Acute  (ICD-466.0) 2)  Laceration, Scalp  (ICD-873.0) 3)  Glucose Intolerance  (ICD-271.3) 4)  Asthma  (ICD-493.90) 5)  Benign Prostatic Hypertrophy  (ICD-600.00) 6)  Depression  (ICD-311) 7)  Rosacea  (ICD-695.3) 8)  Barretts Esophagus  (ICD-530.85) 9)  Gerd  (ICD-530.81) 10)  Glucose Intolerance  (ICD-271.3) 11)  Special Screening Malignant Neoplasm of Prostate  (ICD-V76.44) 12)  Skin Lesion  (ICD-709.9) 13)  Hyperlipidemia  (ICD-272.4) 14)  Myocardial Infarction, Hx of  (ICD-412) 15)  Coronary Artery Disease  (ICD-414.00) 16)  Fatigue  (ICD-780.79)  Medications Prior to Update: 1)  Flomax 0.4 Mg  Cp24 (Tamsulosin Hcl) .... Take 1 By Mouth Once Daily Need Appt For Addtional Refills 2)  Nexium 40 Mg  Cpdr (Esomeprazole Magnesium) .... Take 1 By Mouth Qd 3)  Advair Diskus 250-50 Mcg/dose Misc (Fluticasone-Salmeterol) .... Inhale 1 Puff Twice A Day 4)  Pravachol 40 Mg  Tabs (Pravastatin Sodium) .Marland Kitchen.. 1 By Mouth Qd 5)  Plavix 75 Mg Tabs (Clopidogrel Bisulfate) 6)  Metoprolol Tartrate   Powd (Metoprolol Tartrate) .Marland Kitchen.. 12.5 By Mouth Bid 7)  Ecotrin Low Strength 81 Mg  Tbec (Aspirin) .Marland Kitchen.. 1 By Mouth Qd 8)  Tylenol Extra Strength 500 Mg  Tabs (Acetaminophen) .... 2 Tabs Bid 9)  Mucinex 600 Mg  Tb12 (Guaifenesin) .Marland Kitchen.. 1 Q 4hrs Prn 10)  Tussionex  Pennkinetic Er 8-10 Mg/25ml  Lqcr (Chlorpheniramine-Hydrocodone) .Marland Kitchen.. 1 Tsp By Mouth Two Times A Day Prn 11)  Levaquin 500 Mg  Tabs (Levofloxacin) .Marland Kitchen.. 1 By Mouth Qd 12)  Prednisone 10 Mg  Tabs (Prednisone) .... 3po Qd For 3days, Then 2po Qd For 3days, Then 1po Qd For 3days, Then Stop 13)  Proair Hfa 108 (90 Base) Mcg/act  Aers (Albuterol Sulfate) .... 2 Puffs Qid Prn  Current Medications (verified): 1)  Flomax 0.4 Mg  Cp24 (Tamsulosin Hcl) .... Take 1 By Mouth Once Daily 2)  Nexium 40 Mg  Cpdr (Esomeprazole Magnesium) .... Take 1 By Mouth Once Daily 3)  Pravachol 40 Mg  Tabs (Pravastatin Sodium) .Marland Kitchen.. 1 By Mouth Qd 4)  Metoprolol Tartrate   Powd (Metoprolol Tartrate) .Marland Kitchen.. 12.5 By Mouth Bid 5)  Ecotrin Low Strength 81 Mg  Tbec (Aspirin) .Marland Kitchen.. 1 By Mouth Qd 6)  Niaspan 1000 Mg Cr-Tabs (Niacin (Antihyperlipidemic)) .Marland Kitchen.. 1 By Mouth Once Daily 7)  Claritin-D 24 Hour 10-240 Mg Xr24h-Tab (Loratadine-Pseudoephedrine) .Marland Kitchen.. 1 By Mouth Once Daily As Needed For Allergies 8)  Viagra 100 Mg Tabs (Sildenafil Citrate) .Marland Kitchen.. 1po Once Daily As Needed  Allergies (verified): 1)  ! * Crestor 2)  ! Zocor 3)  ! *  Vytorin 4)  ! 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 Myocardial  infarction, hx of Hyperlipidemia.Marland Kitchenintolerance to Crestor Zocor Vytorin. He tolerates Pravachol GERD Barrett's changes in the esophagus/erosive esophagitis rosacea hx of prostatitis Depression Benign prostatic hypertrophy Asthma  /asthmatic bronchitis right CTS glucose intolerance Arthritis  Family History: Reviewed history from 06/02/2007 and no changes required. father with heart disease/aneurysm per pt mother with heart disease, DM Family History High cholesterol grandfather with cancer to liver sister with DM  Social History: Reviewed history from 06/02/2007 and no changes required. 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   Head:  normocephalic and atraumatic.   Eyes:  vision grossly intact, pupils equal, and pupils round.   Ears:  R ear normal and L ear normal.   Nose:  no external deformity and no nasal discharge.   Mouth:  no gingival abnormalities and pharynx pink and moist.   Neck:  supple and no masses.   Lungs:  normal respiratory effort and normal breath sounds.   Heart:  normal rate, regular rhythm, and no gallop.   Abdomen:  soft, non-tender, and normal bowel sounds.   Msk:  no joint tenderness and no joint swelling.   Extremities:  no edema, no ulcers  Neurologic:  cranial nerves II-XII intact and strength normal in all extremities.     Impression & Recommendations:  Problem # 1:  GLUCOSE INTOLERANCE (ICD-271.3) asympt - urged to lose wt with reducing calories Orders: TLB-A1C / Hgb A1C (Glycohemoglobin) (83036-A1C)  Problem # 2:  FATIGUE (ICD-780.79)  very mild only ? clinical significance - exam benign - to check routine labs  Orders: TLB-BMP (Basic Metabolic Panel-BMET) (80048-METABOL) TLB-CBC  Platelet - w/Differential (85025-CBCD) TLB-Hepatic/Liver Function Pnl (80076-HEPATIC) TLB-TSH (Thyroid Stimulating Hormone) (84443-TSH)  Problem # 3:  HYPERLIPIDEMIA (ICD-272.4)  His updated medication list for this problem includes:    Pravachol 40 Mg Tabs (Pravastatin sodium) .Marland Kitchen... 1 by mouth qd    Niaspan 1000 Mg Cr-tabs (Niacin (antihyperlipidemic)) .Marland Kitchen... 1 by mouth once daily  Orders: TLB-Lipid Panel (80061-LIPID) good compliance and tolerating meds - to check lipids  Problem # 4:  BARRETTS ESOPHAGUS (ICD-530.85)  stable overall by hx and exam, ok to continue meds/tx as is , long term planned, no significant reflux syjmptoms, dysphagiam, n/v or wt loss,d/w pt  Complete Medication List: 1)  Flomax 0.4 Mg Cp24 (Tamsulosin hcl) .... Take 1 by mouth once daily 2)  Nexium 40 Mg Cpdr (Esomeprazole magnesium) .... Take 1 by mouth once daily 3)  Pravachol 40 Mg Tabs (Pravastatin sodium) .Marland Kitchen.. 1 by mouth qd 4)  Metoprolol Tartrate  Powd (Metoprolol tartrate) .Marland Kitchen.. 12.5 by mouth bid 5)  Ecotrin Low Strength 81 Mg Tbec (Aspirin) .Marland Kitchen.. 1 by mouth qd 6)  Niaspan 1000 Mg Cr-tabs (Niacin (antihyperlipidemic)) .Marland Kitchen.. 1 by mouth once daily 7)  Claritin-d 24 Hour 10-240 Mg Xr24h-tab (Loratadine-pseudoephedrine) .Marland Kitchen.. 1 by mouth once daily as needed for allergies 8)  Viagra 100 Mg Tabs (Sildenafil citrate) .Marland Kitchen.. 1po once daily as needed  Other Orders: Tetanus Toxoid w/Dx (16109) Admin 1st Vaccine (60454) TLB-PSA (Prostate Specific Antigen) (84153-PSA)  Patient Instructions: 1)  you had the tetanus shot today 2)  Please go to the Lab in the basement for your blood tests today 3)  Please take all new medications as prescribed 4)  Continue all medications that you may have been taking previously  5)  Please schedule a follow-up appointment in 1 year or sooner if needed Prescriptions: VIAGRA 100 MG TABS (SILDENAFIL CITRATE) 1po once daily as needed  #5 x 11   Entered and Authorized by:   Corwin Levins  MD   Signed by:   Corwin Levins MD on 09/19/2008   Method used:   Print then Give to Patient   RxID:   [id number] NEXIUM 40 MG  CPDR (ESOMEPRAZOLE MAGNESIUM) TAKE 1 by mouth once daily  #90 x 3   Entered and Authorized by:   Corwin Levins MD   Signed by:   Corwin Levins MD on 09/19/2008   Method used:   Print then Give to Patient   RxID:   [id number] FLOMAX 0.4 MG  CP24 (TAMSULOSIN HCL) TAKE 1 by mouth once daily  #30 x 11   Entered and Authorized by:   Corwin Levins MD   Signed by:   Corwin Levins MD on 09/19/2008   Method used:   Electronically to        [facility] (retail)       [address]       [place], [place]  [address]       Ph: [phone] or [phone]       Fax: [phone]   RxID:   [id number]    Immunizations Administered:  Tetanus Vaccine:    Vaccine Type: Td    Site: right deltoid    Mfr: San[employer]i Pasteur    Dose: 0.5 ml    Route: IM    Given by: Windell Norfolk    Exp. Date: 05/21/2010    Lot #: [id number]

## 2010-05-12 NOTE — Medication Information (Signed)
Summary: P Auth NEXIUM/[facility]  P Auth NEXIUM/[facility]   Imported By: Lester Woodward 09/23/2008 08:47:05  _____________________________________________________________________  External Attachment:    Type:   Image     Comment:   External Document

## 2010-05-12 NOTE — Assessment & Plan Note (Signed)
Summary: cough congestion not better.emr note /dd   Vital Signs:  Patient Profile:   77 Years Old Male Weight:      224 pounds Temp:     97.8 degrees F Pulse rate:   79 / minute BP sitting:   115 / 74  (right arm) Cuff size:   large  Pt. in pain?   no  Vitals Entered By: Maris Berger (July 04, 2007 10:33 AM)                  Chief Complaint:  congestion not better.  History of Present Illness: unfortunately, now with increased wheezing and persistent cough, mild increased sob/doe with tx as per last ov, now worse it seems to take deep breath; no CP or leg swelling, high fevers    Updated Prior Medication List: FLOMAX 0.4 MG  CP24 (TAMSULOSIN HCL) TAKE 1 by mouth once daily NEED APPT FOR ADDTIONAL REFILLS NEXIUM 40 MG  CPDR (ESOMEPRAZOLE MAGNESIUM) TAKE 1 by mouth QD ADVAIR DISKUS 250-50 MCG/DOSE MISC (FLUTICASONE-SALMETEROL) Inhale 1 puff twice a day PRAVACHOL 40 MG  TABS (PRAVASTATIN SODIUM) 1 by mouth qd PLAVIX 75 MG TABS (CLOPIDOGREL BISULFATE)  METOPROLOL TARTRATE   POWD (METOPROLOL TARTRATE) 12.5 by mouth bid ECOTRIN LOW STRENGTH 81 MG  TBEC (ASPIRIN) 1 by mouth qd TYLENOL EXTRA STRENGTH 500 MG  TABS (ACETAMINOPHEN) 2 tabs bid MUCINEX 600 MG  TB12 (GUAIFENESIN) 1 q 4hrs prn TUSSIONEX PENNKINETIC ER 8-10 MG/5ML  LQCR (CHLORPHENIRAMINE-HYDROCODONE) 1 tsp by mouth two times a day prn LEVAQUIN 500 MG  TABS (LEVOFLOXACIN) 1 by mouth qd PREDNISONE 10 MG  TABS (PREDNISONE) 3po qd for 3days, then 2po qd for 3days, then 1po qd for 3days, then stop PROAIR HFA 108 (90 BASE) MCG/ACT  AERS (ALBUTEROL SULFATE) 2 puffs qid prn  Current Allergies (reviewed today): ! * CRESTOR ! ZOCOR ! * VYTORIN ! CODEINE  Past Medical History:    Reviewed history from 06/02/2007 and no changes required:       Coronary artery disease       Myocardial infarction, hx of       Hyperlipidemia       GERD       Barrett's       rosacea       hx of prostatitis       Depression  Benign prostatic hypertrophy       Asthma       right CTS       glucose intolerance  Past Surgical History:    Reviewed history from 06/02/2007 and no changes required:       C-spine disc surgury - dr Dutch Quint - 12/07       s/p coronary stent       Coronary artery bypass graft       Appendectomy       Tonsillectomy       s/p coccyx surgury 1997   Social History:    Reviewed history from 06/02/2007 and no changes required:       Never Smoked       Alcohol use-yes       Married       3 children       retired - Research officer, political party     Physical Exam  General:     mild ill Head:     Normocephalic and atraumatic without obvious abnormalities. No apparent alopecia or balding. Eyes:     No corneal or conjunctival inflammation noted.  EOMI. Perrla.  Ears:     bilat tm's red Nose:     nasal dischargemucosal pallor.   Mouth:     pharyngeal erythema.   Neck:     cervical lymphadenopathy.   Lungs:     R decreased breath sounds, R wheezes, L decreased breath sounds, and L wheezes.   Heart:     Normal rate and regular rhythm. S1 and S2 normal without gallop, murmur, click, rub or other extra sounds. Extremities:     no edema, no ulcers     Impression & Recommendations:  Problem # 1:  ASTHMATIC BRONCHITIS, ACUTE (ICD-466.0)  The following medications were removed from the medication list:    Robitussin Cough Long-acting 15 Mg/15ml Syrp (Dextromethorphan hbr) .Marland Kitchen... 2 tsp tid    Ceftin 250 Mg Tabs (Cefuroxime axetil) .Marland Kitchen... 1 by mouth bid  His updated medication list for this problem includes:    Advair Diskus 250-50 Mcg/dose Misc (Fluticasone-salmeterol) ..... Inhale 1 puff twice a day    Mucinex 600 Mg Tb12 (Guaifenesin) .Marland Kitchen... 1 q 4hrs prn    Tussionex Pennkinetic Er 8-10 Mg/39ml Lqcr (Chlorpheniramine-hydrocodone) .Marland Kitchen... 1 tsp by mouth two times a day prn    Levaquin 500 Mg Tabs (Levofloxacin) .Marland Kitchen... 1 by mouth qd    Proair Hfa 108 (90 Base) Mcg/act Aers (Albuterol sulfate) .Marland Kitchen... 2  puffs qid prn  tx as above, as well as depo 120 mg Im in the office; cannot r/o worsening pna -re-check CXR Orders: T-2 View CXR, Same Day (71020.5TC) Depo- Medrol 40mg  (J1030) Admin of Therapeutic Inj  intramuscular or subcutaneous (16109) Depo- Medrol 80mg  (J1040)   Complete Medication List: 1)  Flomax 0.4 Mg Cp24 (Tamsulosin hcl) .... Take 1 by mouth once daily need appt for addtional refills 2)  Nexium 40 Mg Cpdr (Esomeprazole magnesium) .... Take 1 by mouth qd 3)  Advair Diskus 250-50 Mcg/dose Misc (Fluticasone-salmeterol) .... Inhale 1 puff twice a day 4)  Pravachol 40 Mg Tabs (Pravastatin sodium) .Marland Kitchen.. 1 by mouth qd 5)  Plavix 75 Mg Tabs (Clopidogrel bisulfate) 6)  Metoprolol Tartrate Powd (Metoprolol tartrate) .Marland Kitchen.. 12.5 by mouth bid 7)  Ecotrin Low Strength 81 Mg Tbec (Aspirin) .Marland Kitchen.. 1 by mouth qd 8)  Tylenol Extra Strength 500 Mg Tabs (Acetaminophen) .... 2 tabs bid 9)  Mucinex 600 Mg Tb12 (Guaifenesin) .Marland Kitchen.. 1 q 4hrs prn 10)  Tussionex Pennkinetic Er 8-10 Mg/63ml Lqcr (Chlorpheniramine-hydrocodone) .Marland Kitchen.. 1 tsp by mouth two times a day prn 11)  Levaquin 500 Mg Tabs (Levofloxacin) .Marland Kitchen.. 1 by mouth qd 12)  Prednisone 10 Mg Tabs (Prednisone) .... 3po qd for 3days, then 2po qd for 3days, then 1po qd for 3days, then stop 13)  Proair Hfa 108 (90 Base) Mcg/act Aers (Albuterol sulfate) .... 2 puffs qid prn   Patient Instructions: 1)  take all medications as prescribed 2)  continue all medications that you may have been taking previously 3)  you will have chest xray today 4)  Please schedule a follow-up appointment as needed.    Prescriptions: PROAIR HFA 108 (90 BASE) MCG/ACT  AERS (ALBUTEROL SULFATE) 2 puffs qid prn  #1 x 11   Entered and Authorized by:   Corwin Levins MD   Signed by:   Corwin Levins MD on 07/04/2007   Method used:   Print then Give to Patient   RxID:   [id number] PREDNISONE 10 MG  TABS (PREDNISONE) 3po qd for 3days, then 2po qd for 3days, then 1po qd for  3days,  then stop  #18 x 0   Entered and Authorized by:   Corwin Levins MD   Signed by:   Corwin Levins MD on 07/04/2007   Method used:   Print then Give to Patient   RxID:   [id number] LEVAQUIN 500 MG  TABS (LEVOFLOXACIN) 1 by mouth qd  #10 x 0   Entered and Authorized by:   Corwin Levins MD   Signed by:   Corwin Levins MD on 07/04/2007   Method used:   Print then Give to Patient   RxID:   [id number]  ]  Medication Administration  Injection # 1:    Medication: Depo- Medrol 40mg     Diagnosis: ASTHMATIC BRONCHITIS, ACUTE (ICD-466.0)    Route: IM    Site: RUOQ gluteus    Exp Date: 03/12/2008    Lot #: [id number]    Mfr: sicor    Patient tolerated injection without complications    Given by: Maris Berger (July 04, 2007 11:08 AM)  Injection # 2:    Medication: Depo- Medrol 80mg     Diagnosis: ASTHMATIC BRONCHITIS, ACUTE (ICD-466.0)    Route: IM    Site: RUOQ gluteus    Exp Date: 03/12/2009    Lot #: [id number]    Mfr: sicor    Comments: pt received 120 mg    Patient tolerated injection without complications    Given by: Maris Berger (July 04, 2007 11:09 AM)  Orders Added: 1)  T-2 View CXR, Same Day [71020.5TC] 2)  Depo- Medrol 40mg  [J1030] 3)  Admin of Therapeutic Inj  intramuscular or subcutaneous [96372] 4)  Depo- Medrol 80mg  [J1040] 5)  Est. Patient Level III [66440]

## 2010-05-12 NOTE — Assessment & Plan Note (Signed)
     Past History:  Past Medical History: CAD....Marland KitchenCABG in 1992........in [place] stent to the native LAD after the insertion LIMA done in April 2007 through the internal mammary artery.  last Myoview 2006 Myocardial infarction, hx of Hyperlipidemia.Marland Kitchenintolerance to Crestor Zocor Vytorin. He tolerates Pravachol..low HDL GERD Barrett's changes in the esophagus/erosive esophagitis rosacea hx of prostatitis Depression Benign prostatic hypertrophy Asthma  /asthmatic bronchitis coccyx bone removal surgery Paresthesias... successful surgery by Dr. Julio Sicks glucose intolerance Arthritis

## 2010-05-12 NOTE — Assessment & Plan Note (Signed)
Summary: CONGESTION/NWS   Vital Signs:  Patient Profile:   77 Years Old Male Weight:      227 pounds Temp:     98.1 degrees F Pulse rate:   72 / minute BP sitting:   110 / 69  (right arm) Cuff size:   regular  Pt. in pain?   no  Vitals Entered By: Maris Berger (June 28, 2007 1:59 PM)                  Chief Complaint:  congestion.  History of Present Illness: here with acute onset 3 to 4 days symptoms head and chest congestion with fever, prod cough, no better with OTC meds; some increasing wheezing and sob, worse in the AM    Updated Prior Medication List: FLOMAX 0.4 MG  CP24 (TAMSULOSIN HCL) TAKE 1 by mouth once daily NEED APPT FOR ADDTIONAL REFILLS NEXIUM 40 MG  CPDR (ESOMEPRAZOLE MAGNESIUM) TAKE 1 by mouth QD ADVAIR DISKUS 250-50 MCG/DOSE MISC (FLUTICASONE-SALMETEROL) Inhale 1 puff twice a day PRAVACHOL 40 MG  TABS (PRAVASTATIN SODIUM) 1 by mouth qd PLAVIX 75 MG TABS (CLOPIDOGREL BISULFATE)  METOPROLOL TARTRATE   POWD (METOPROLOL TARTRATE) 12.5 by mouth bid ECOTRIN LOW STRENGTH 81 MG  TBEC (ASPIRIN) 1 by mouth qd TYLENOL EXTRA STRENGTH 500 MG  TABS (ACETAMINOPHEN) 2 tabs bid ROBITUSSIN COUGH LONG-ACTING 15 MG/5ML  SYRP (DEXTROMETHORPHAN HBR) 2 tsp tid MUCINEX 600 MG  TB12 (GUAIFENESIN) 1 q 4hrs prn CEFTIN 250 MG  TABS (CEFUROXIME AXETIL) 1 by mouth bid TUSSIONEX PENNKINETIC ER 8-10 MG/5ML  LQCR (CHLORPHENIRAMINE-HYDROCODONE) 1 tsp by mouth two times a day prn  Current Allergies (reviewed today): ! * CRESTOR ! ZOCOR ! * VYTORIN ! CODEINE  Past Medical History:    Reviewed history from 06/02/2007 and no changes required:       Coronary artery disease       Myocardial infarction, hx of       Hyperlipidemia       GERD       Barrett's       rosacea       hx of prostatitis       Depression       Benign prostatic hypertrophy       Asthma       right CTS       glucose intolerance  Past Surgical History:    Reviewed history from 06/02/2007 and no  changes required:       C-spine disc surgury - dr Dutch Quint - 12/07       s/p coronary stent       Coronary artery bypass graft       Appendectomy       Tonsillectomy       s/p coccyx surgury 1997   Social History:    Reviewed history from 06/02/2007 and no changes required:       Never Smoked       Alcohol use-yes       Married       3 children       retired - Research officer, political party     Physical Exam  General:     mild ill Head:     Normocephalic and atraumatic without obvious abnormalities. No apparent alopecia or balding. Eyes:     No corneal or conjunctival inflammation noted. EOMI. Perrla.  Ears:     left TM mod red, bulging Nose:     nasal dischargemucosal pallor.   Mouth:  pharyngeal erythema.   Neck:     No deformities, masses, or tenderness noted. Lungs:     R decreased breath sounds, R base crackles, and L decreased breath sounds.   - no wheezing Heart:     Normal rate and regular rhythm. S1 and S2 normal without gallop, murmur, click, rub or other extra sounds. Extremities:     no edema, no ulcers     Impression & Recommendations:  Problem # 1:  BRONCHITIS-ACUTE (ICD-466.0)  His updated medication list for this problem includes:    Advair Diskus 250-50 Mcg/dose Misc (Fluticasone-salmeterol) ..... Inhale 1 puff twice a day    Robitussin Cough Long-acting 15 Mg/77ml Syrp (Dextromethorphan hbr) .Marland Kitchen... 2 tsp tid    Mucinex 600 Mg Tb12 (Guaifenesin) .Marland Kitchen... 1 q 4hrs prn    Ceftin 250 Mg Tabs (Cefuroxime axetil) .Marland Kitchen... 1 by mouth bid    Tussionex Pennkinetic Er 8-10 Mg/64ml Lqcr (Chlorpheniramine-hydrocodone) .Marland Kitchen... 1 tsp by mouth two times a day prn  tx as above, cannot r/o PNA with LLL rales - check cxr Orders: T-2 View CXR, Same Day (71020.5TC)   Complete Medication List: 1)  Flomax 0.4 Mg Cp24 (Tamsulosin hcl) .... Take 1 by mouth once daily need appt for addtional refills 2)  Nexium 40 Mg Cpdr (Esomeprazole magnesium) .... Take 1 by mouth qd 3)  Advair Diskus  250-50 Mcg/dose Misc (Fluticasone-salmeterol) .... Inhale 1 puff twice a day 4)  Pravachol 40 Mg Tabs (Pravastatin sodium) .Marland Kitchen.. 1 by mouth qd 5)  Plavix 75 Mg Tabs (Clopidogrel bisulfate) 6)  Metoprolol Tartrate Powd (Metoprolol tartrate) .Marland Kitchen.. 12.5 by mouth bid 7)  Ecotrin Low Strength 81 Mg Tbec (Aspirin) .Marland Kitchen.. 1 by mouth qd 8)  Tylenol Extra Strength 500 Mg Tabs (Acetaminophen) .... 2 tabs bid 9)  Robitussin Cough Long-acting 15 Mg/75ml Syrp (Dextromethorphan hbr) .... 2 tsp tid 10)  Mucinex 600 Mg Tb12 (Guaifenesin) .Marland Kitchen.. 1 q 4hrs prn 11)  Ceftin 250 Mg Tabs (Cefuroxime axetil) .Marland Kitchen.. 1 by mouth bid 12)  Tussionex Pennkinetic Er 8-10 Mg/77ml Lqcr (Chlorpheniramine-hydrocodone) .Marland Kitchen.. 1 tsp by mouth two times a day prn   Patient Instructions: 1)  take all medications as prescribed 2)  continue all other medications that you may have been taking previously 3)  you will have chest xray today 4)  follow as indicated at last visit    Prescriptions: TUSSIONEX PENNKINETIC ER 8-10 MG/5ML  LQCR (CHLORPHENIRAMINE-HYDROCODONE) 1 tsp by mouth two times a day prn  #6 oz x 1   Entered and Authorized by:   Corwin Levins MD   Signed by:   Corwin Levins MD on 06/28/2007   Method used:   Print then Give to Patient   RxID:   [id number] CEFTIN 250 MG  TABS (CEFUROXIME AXETIL) 1 by mouth bid  #20 x 0   Entered and Authorized by:   Corwin Levins MD   Signed by:   Corwin Levins MD on 06/28/2007   Method used:   Print then Give to Patient   RxID:   [id number]  ]

## 2010-05-12 NOTE — Progress Notes (Signed)
  Phone Note Call from Patient Call back at Home Phone [phone]   Caller: Patient Call For: Dr Jonny Ruiz Summary of Call: Pt is requesting labs for 6/10 appt. He sched a f/u appt does not want a cpx. Please advise as to what labs he may be do for? Initial call taken by: Verdell Face,  September 16, 2008 10:50 AM  Follow-up for Phone Call        good b/c medicare does not pay for CPX; also since medicare is his primary, I would not order tests as they are not paid for when done prior to a visit Follow-up by: Corwin Levins MD,  September 16, 2008 1:30 PM  Additional Follow-up for Phone Call Additional follow up Details #1::        called pt to inform pt informed made aware Additional Follow-up by: Shelbie Proctor,  September 16, 2008 1:43 PM

## 2010-05-12 NOTE — Miscellaneous (Signed)
  Clinical Lists Changes  Observations: Added new observation of PAST MED HX: CABG in 1992... CAD.....nuclear 2006. /   .Taxus stent native LAD after the LIMA insertion .Marland KitchenMarland KitchenApril 2007.... through the internal mammary artery....  Dr Katz/cardiology EF  echo..2001... /  cath  2007..60% Myocardial infarction, hx of Hyperlipidemia.Marland Kitchenintolerance to Crestor Zocor Vytorin. He tolerates Pravachol..low HDL GERD Barrett's changes in the esophagus/erosive esophagitis rosacea hx of prostatitis Depression Benign prostatic hypertrophy Asthma  /asthmatic bronchitis coccyx bone removal surgery Paresthesias... successful surgery by Dr. Julio Sicks Arthritis Diabetes mellitus, type II - diet  (10/13/2009 14:21) Added new observation of PRIMARY MD: Oliver Barre, MD (10/13/2009 14:21)       Past History:  Past Medical History: CABG in 1992... CAD.....nuclear 2006. /   .Taxus stent native LAD after the LIMA insertion .Marland KitchenMarland KitchenApril 2007.... through the internal mammary artery....  Dr Katz/cardiology EF  echo..2001... /  cath  2007..60% Myocardial infarction, hx of Hyperlipidemia.Marland Kitchenintolerance to Crestor Zocor Vytorin. He tolerates Pravachol..low HDL GERD Barrett's changes in the esophagus/erosive esophagitis rosacea hx of prostatitis Depression Benign prostatic hypertrophy Asthma  /asthmatic bronchitis coccyx bone removal surgery Paresthesias... successful surgery by Dr. Julio Sicks Arthritis Diabetes mellitus, type II - diet

## 2010-05-12 NOTE — Assessment & Plan Note (Signed)
Summary: NEEDS STAPLES REMOVED FROM FALL ON 2-10/NML  Medications Added ADVAIR DISKUS 250-50 MCG/DOSE MISC (FLUTICASONE-SALMETEROL) Inhale 1 puff twice a day LOVASTATIN 40 MG TABS (LOVASTATIN) 1 tablet by mouth once a day PRAVACHOL 40 MG  TABS (PRAVASTATIN SODIUM) 1 by mouth qd PLAVIX 75 MG TABS (CLOPIDOGREL BISULFATE)  ECOTRIN LOW STRENGTH 81 MG  TBEC (ASPIRIN) 1 by mouth qd      Allergies Added: ! * CRESTOR ! ZOCOR ! * VYTORIN ! CODEINE  Preventive Care Screening  Colonoscopy:    Date:  11/26/2002    Next Due:  12/2009    Results:  Diverticulosis   Last Pneumovax:    Date:  04/15/2006    Next Due:  04/2011    Results:  given    Vital Signs:  Patient Profile:   77 Years Old Male Weight:      232 pounds Temp:     97.7 degrees F oral Pulse rate:   64 / minute BP sitting:   128 / 78  (right arm) Cuff size:   large  Pt. in pain?   no  Vitals Entered By: Maris Berger (June 02, 2007 8:18 AM)                  Chief Complaint:  staples removed.  History of Present Illness: fell feb 10 and hit head ion the armoir and now with 3 staples to the scalp laceration - here for removal; no LOC per pt    Updated Prior Medication List: FLOMAX 0.4 MG  CP24 (TAMSULOSIN HCL) TAKE 1 by mouth once daily NEED APPT FOR ADDTIONAL REFILLS NEXIUM 40 MG  CPDR (ESOMEPRAZOLE MAGNESIUM) TAKE 1 by mouth QD ADVAIR DISKUS 250-50 MCG/DOSE MISC (FLUTICASONE-SALMETEROL) Inhale 1 puff twice a day PRAVACHOL 40 MG  TABS (PRAVASTATIN SODIUM) 1 by mouth qd PLAVIX 75 MG TABS (CLOPIDOGREL BISULFATE)  METOPROLOL TARTRATE   POWD (METOPROLOL TARTRATE) 12.5 by mouth bid ECOTRIN LOW STRENGTH 81 MG  TBEC (ASPIRIN) 1 by mouth qd  Current Allergies (reviewed today): ! * CRESTOR ! ZOCOR ! * VYTORIN ! CODEINE  Past Medical History:    Reviewed history and no changes required:       Coronary artery disease       Myocardial infarction, hx of       Hyperlipidemia       GERD  Barrett's       rosacea       hx of prostatitis       Depression       Benign prostatic hypertrophy       Asthma       right CTS       glucose intolerance  Past Surgical History:    Reviewed history and no changes required:       C-spine disc surgury - dr Dutch Quint - 12/07       s/p coronary stent       Coronary artery bypass graft       Appendectomy       Tonsillectomy       s/p coccyx surgury 1997   Family History:    Reviewed history and no changes required:       father with heart disease/aneurysm per pt       mother with heart disease, DM       Family History High cholesterol       grandfather with cancer to liver       sister with DM  Social History:    Reviewed history and no changes required:       Never Smoked       Alcohol use-yes       Married       3 children       retired - real estate   Risk Factors:  Tobacco use:  never Alcohol use:  yes  Colonoscopy History:     Date of Last Colonoscopy:  11/26/2002    Results:  Diverticulosis    Review of Systems  The patient denies anorexia, fever, weight loss, weight gain, vision loss, decreased hearing, hoarseness, chest pain, syncope, dyspnea on exhertion, peripheral edema, prolonged cough, hemoptysis, abdominal pain, melena, hematochezia, severe indigestion/heartburn, hematuria, incontinence, genital sores, muscle weakness, suspicious skin lesions, transient blindness, difficulty walking, depression, unusual weight change, abnormal bleeding, enlarged lymph nodes, angioedema, breast masses, and testicular masses.         some ongoin fatigue but no OSA symptoms; also skin lesion to the right arm - irritated looking for over a year   Physical Exam  General:     Well-developed,well-nourished,in no acute distress; alert,appropriate and cooperative throughout examination Head:     Normocephalic and atraumatic without obvious abnormalities. No apparent alopecia or balding. Eyes:     No corneal or conjunctival  inflammation noted. EOMI. Perrla. Ears:     External ear exam shows no significant lesions or deformities.  Otoscopic examination reveals clear canals, tympanic membranes are intact bilaterally without bulging, retraction, inflammation or discharge. Hearing is grossly normal bilaterally. Nose:     External nasal examination shows no deformity or inflammation. Nasal mucosa are pink and moist without lesions or exudates. Mouth:     Oral mucosa and oropharynx without lesions or exudates.  Teeth in good repair. Neck:     No deformities, masses, or tenderness noted. Lungs:     Normal respiratory effort, chest expands symmetrically. Lungs are clear to auscultation, no crackles or wheezes. Heart:     Normal rate and regular rhythm. S1 and S2 normal without gallop, murmur, click, rub or other extra sounds. Abdomen:     Bowel sounds positive,abdomen soft and non-tender without masses, organomegaly or hernias noted. Msk:     no synovitis Extremities:     no edema, no ulcers Neurologic:     No cranial nerve deficits noted. Station and gait are normal. Plantar reflexes are down-going bilaterally. DTRs are symmetrical throughout. Sensory, motor and coordinative functions appear intact. Skin:     right mid forearm with 1 cm slightly scaly erthem lesion    Impression & Recommendations:  Problem # 1:  FATIGUE (ICD-780.79) exam benign for acute problems, will check routine labs  Orders: TLB-BMP (Basic Metabolic Panel-BMET) (80048-METABOL) TLB-CBC Platelet - w/Differential (85025-CBCD) TLB-Hepatic/Liver Function Pnl (80076-HEPATIC) TLB-TSH (Thyroid Stimulating Hormone) (84443-TSH)   Problem # 2:  HYPERLIPIDEMIA (ICD-272.4) check lipids - goal ldl less than 70  His updated medication list for this problem includes:    Pravachol 40 Mg Tabs (Pravastatin sodium) .Marland Kitchen... 1 by mouth qd  Orders: TLB-Lipid Panel (80061-LIPID)   Problem # 3:  SKIN LESION (ICD-709.9) suspicious for malignancy - pt  has dermatologist and plans to call for appt  Problem # 4:  SPECIAL SCREENING MALIGNANT NEOPLASM OF PROSTATE (ICD-V76.44) check psa as he is due Orders: TLB-PSA (Prostate Specific Antigen) (84153-PSA)   Problem # 5:  GERD (ICD-530.81)  His updated medication list for this problem includes:    Nexium 40 Mg Cpdr (Esomeprazole magnesium) .Marland Kitchen... Take  1 by mouth qd  o/w stable, cont meds as is  Problem # 6:  LACERATION, SCALP (ICD-873.0) staples removed  Complete Medication List: 1)  Flomax 0.4 Mg Cp24 (Tamsulosin hcl) .... Take 1 by mouth once daily need appt for addtional refills 2)  Nexium 40 Mg Cpdr (Esomeprazole magnesium) .... Take 1 by mouth qd 3)  Advair Diskus 250-50 Mcg/dose Misc (Fluticasone-salmeterol) .... Inhale 1 puff twice a day 4)  Pravachol 40 Mg Tabs (Pravastatin sodium) .Marland Kitchen.. 1 by mouth qd 5)  Plavix 75 Mg Tabs (Clopidogrel bisulfate) 6)  Metoprolol Tartrate Powd (Metoprolol tartrate) .Marland Kitchen.. 12.5 by mouth bid 7)  Ecotrin Low Strength 81 Mg Tbec (Aspirin) .Marland Kitchen.. 1 by mouth qd  Other Orders: TLB-A1C / Hgb A1C (Glycohemoglobin) (83036-A1C)   Patient Instructions: 1)  take all medications as you do 2)  you will have blood work today 3)  Please schedule a follow-up appointment in 6 months.    Prescriptions: ADVAIR DISKUS 250-50 MCG/DOSE MISC (FLUTICASONE-SALMETEROL) Inhale 1 puff twice a day  #1 x 11   Entered and Authorized by:   Corwin Levins MD   Signed by:   Corwin Levins MD on 06/02/2007   Method used:   Electronically sent to ...       [id number]       [address]       [place], [place]  [address]       Ph: [phone] or [phone]       Fax: [phone]   RxID:   [id number] FLOMAX 0.4 MG  CP24 (TAMSULOSIN HCL) TAKE 1 by mouth once daily NEED APPT FOR ADDTIONAL REFILLS  #34 x 11   Entered and Authorized by:   Corwin Levins MD   Signed by:   Corwin Levins MD on 06/02/2007   Method used:   Electronically  sent to ...       [facility] [address]  [id number]       [address]       [place], [place]  [address]       Ph: [phone] or [phone]       Fax: [phone]   RxID:   [id number] NEXIUM 40 MG  CPDR (ESOMEPRAZOLE MAGNESIUM) TAKE 1 by mouth QD  #34 x 11   Entered and Authorized by:   Corwin Levins MD   Signed by:   Corwin Levins MD on 06/02/2007   Method used:   Electronically sent to ...       [facility] [address]  [id number]       [address]       [place], [place]  [address]       Ph: [phone] or [phone]       Fax: [phone]   RxID:   [id number] FLOMAX 0.4 MG  CP24 (TAMSULOSIN HCL) TAKE 1 by mouth once daily NEED APPT FOR ADDTIONAL REFILLS  #30 x 11   Entered and Authorized by:   Corwin Levins MD   Signed by:   Corwin Levins MD on 06/02/2007   Method used:   Electronically sent to ...       [facility] [address]  [id number]       [address]       [place], [place]  [address]       Ph: [phone] or [phone]       Fax: [phone]   RxID:   [id number]  ]

## 2010-05-12 NOTE — Letter (Signed)
Summary: Colonoscopy Date Change Letter  [facility]  [address] [place], [place] [address]   Phone: [phone]  Fax: [phone]      October 03, 2009 MRN: [id number]   Sean Lozano [address] [place], [place]  [address]   Dear Sean Lozano,   Previously you were recommended to have a repeat colonoscopy around this time. Your chart was recently reviewed by Dr. Judie Petit T. Russella Dar of  Gastroenterology. Follow up colonoscopy is now recommended in August 2014. This revised recommendation is based on current, nationally recognized guidelines for colorectal cancer screening and polyp surveillance. These guidelines are endorsed by the American Cancer Society, [employer] on Colorectal Cancer as well as numerous other major medical organizations.  Please understand that our recommendation assumes that you do not have any new symptoms such as bleeding, a change in bowel habits, anemia, or significant abdominal discomfort. If you do have any concerning GI symptoms or want to discuss the guideline recommendations, please call to arrange an office visit at your earliest convenience. Otherwise we will keep you in our reminder system and contact you 1-2 months prior to the date listed above to schedule your next colonoscopy.  Thank you,  Judie Petit T. Russella Dar, M.D.  [facility] [phone]

## 2010-05-12 NOTE — Progress Notes (Signed)
Summary: pt wants lab results   Phone Note Call from Patient Call back at Home Phone [phone]   Reason for Call: Talk to Nurse Summary of Call: pt had a medication change and had lab work done twice but haven't heard any results from it so he want his lab results/lg Initial call taken by: Omer Jack,  August 07, 2008 10:35 AM  Follow-up for Phone Call        Phone Call Completed Follow-up by: Meredith Staggers, RN,  August 07, 2008 12:08 PM

## 2010-05-12 NOTE — Progress Notes (Signed)
  Phone Note Call from Patient Call back at Home Phone [phone]   Caller: [phone] Reason for Call: Lab or Test Results Summary of Call: per pt was seen on 06-28-07 need the results from x ray report still not feeling better after 7 days of medication Initial call taken by: Shelbie Proctor,  July 03, 2007 1:16 PM  Follow-up for Phone Call        bronchitis only on the cxr - consider ROV if now better Follow-up by: Corwin Levins MD,  July 03, 2007 1:23 PM  Additional Follow-up for Phone Call Additional follow up Details #1::        July 03, 2007 1:39 PM appt 07-04-07 spoke with pt made aware  Additional Follow-up by: Shelbie Proctor,  July 03, 2007 1:39 PM

## 2010-05-12 NOTE — Assessment & Plan Note (Signed)
Summary: f18m   Visit Type:  Follow-up Primary Provider:  Oliver Barre, MD  CC:  CAD.  History of Present Illness: The patient is seen for followup of his coronary artery disease.  He is doing well.  He has no chest pain.  He is going off all activities including his time at the gym today.  He's had no syncope or presyncope.  I saw him last March 29, 2008.  Patient underwent CABG in 1992.  He received a Taxus stent to the native LAD after the insertion site in 2007.  This procedure was done through the internal mammary artery.  He has done well since then.  He has had no symptoms and has not had an exercise test since then.  Patient also has hyperlipidemia.  He is intolerant of Crestor Zocor Vytorin.  He tolerates Pravachol.  With low HDL Niaspan has been added and he has had some improvement.  I am pleased with his overall progress.  Current Medications (verified): 1)  Flomax 0.4 Mg  Cp24 (Tamsulosin Hcl) .... Take 1 By Mouth Once Daily 2)  Nexium 40 Mg  Cpdr (Esomeprazole Magnesium) .... Take 1 By Mouth Once Daily 3)  Pravachol 40 Mg  Tabs (Pravastatin Sodium) .Marland Kitchen.. 1 By Mouth Qd 4)  Metoprolol Tartrate   Powd (Metoprolol Tartrate) .Marland Kitchen.. 12.5 By Mouth Bid 5)  Ecotrin Low Strength 81 Mg  Tbec (Aspirin) .Marland Kitchen.. 1 By Mouth Qd 6)  Niaspan 1000 Mg Cr-Tabs (Niacin (Antihyperlipidemic)) .Marland Kitchen.. 1 By Mouth Once Daily 7)  Claritin-D 24 Hour 10-240 Mg Xr24h-Tab (Loratadine-Pseudoephedrine) .Marland Kitchen.. 1 By Mouth Once Daily As Needed For Allergies  Allergies (verified): 1)  ! * Crestor 2)  ! Zocor 3)  ! * Vytorin 4)  ! Codeine  Past History:  Past Medical History: CAD....Marland KitchenCABG in 1992....Marland KitchenMarland KitchenTaxus stent to the native LAD after the LIMA insertion ... done in April 2007 through the internal mammary artery..... last Myoview 2006 Myocardial infarction, hx of Hyperlipidemia.Marland Kitchenintolerance to Crestor Zocor Vytorin. He tolerates Pravachol..low HDL GERD Barrett's changes in the esophagus/erosive  esophagitis rosacea hx of prostatitis Depression Benign prostatic hypertrophy Asthma  /asthmatic bronchitis coccyx bone removal surgery Paresthesias... successful surgery by Dr. Julio Sicks glucose intolerance Arthritis  Review of Systems       The patient denies fevers chills or skin rash.  He has no headache.  There is no chest pain or shortness of breath.  He has no change in vision or hearing.  There is no GI or GU symptoms.  All other systems are reviewed and are negative.  Vital Signs:  Patient profile:   77 year old male Height:      71.5 inches Weight:      233 pounds BMI:     32.16 Pulse rate:   76 / minute BP sitting:   110 / 80  (left arm)  Vitals Entered By: Laurance Flatten CMA (October 24, 2008 3:32 PM)  Physical Exam  General:  The patient is stable in general. Eyes:  There is no xanthelasma. Neck:  There is no jugular venous distention. Lungs:  Lungs are clear.  Respiratory effort is nonlabored. Heart:  Cardiac exam reveals S1 and S2.  There no clicks or significant murmurs. Abdomen:  The abdomen is protuberant but soft. Msk:  There no musculoskeletal deformities. Extremities:  There is no peripheral edema. Psych:  The patient is oriented to person time and place.  Affect is normal.   Impression & Recommendations:  Problem # 1:  CORONARY ARTERY  DISEASE (ICD-414.00)  His updated medication list for this problem includes:    Metoprolol Tartrate Powd (Metoprolol tartrate) .Marland Kitchen... 12.5 by mouth bid    Ecotrin Low Strength 81 Mg Tbec (Aspirin) .Marland Kitchen... 1 by mouth qd Coronary artery disease is stable.  Patient has not had an exercise test since his intervention.  He has no symptoms.  No testing is planned at this time.  Problem # 2:  HYPERLIPIDEMIA (ICD-272.4)  His updated medication list for this problem includes:    Pravachol 40 Mg Tabs (Pravastatin sodium) .Marland Kitchen... 1 by mouth qd    Niaspan 1000 Mg Cr-tabs (Niacin (antihyperlipidemic)) .Marland Kitchen... 1 by mouth once daily I'm  pleased with the overall response to his lipid medications.  I am not inclined to push his meds any further.  Other Orders: EKG w/ Interpretation (93000)  Patient Instructions: 1)  Follow up in 1 year

## 2010-05-14 NOTE — Progress Notes (Signed)
Summary: problem with med  Phone Note Call from Patient   Caller: Patient [phone] Reason for Call: Talk to Nurse Summary of Call: pt calling to say he is having leg pain and thinks from his niaspan, due to this happening before-pls advise Initial call taken by: Glynda Jaeger,  April 16, 2010 8:33 AM  Follow-up for Phone Call        Left message to call back Meredith Staggers, RN  April 16, 2010 2:29 PM   pt returning call Glynda Jaeger  April 16, 2010 3:03 PM  spoke w/pt he states that over past couple of months he has had weakness and pain in knees and hips this AM he could hardley get out of bed, he wanted to know if it could be from Niaspan advised not a common SE, he is going to f/u w/Dr Jonny Ruiz   Follow-up by: Meredith Staggers, RN,  April 16, 2010 5:20 PM

## 2010-07-16 LAB — GLUCOSE, CAPILLARY: Glucose-Capillary: 167 mg/dL — ABNORMAL HIGH (ref 70–99)

## 2010-07-17 LAB — BASIC METABOLIC PANEL
BUN: 12 mg/dL (ref 6–23)
CO2: 26 mEq/L (ref 19–32)
Calcium: 9.1 mg/dL (ref 8.4–10.5)
Chloride: 103 mEq/L (ref 96–112)
Creatinine, Ser: 1.28 mg/dL (ref 0.4–1.5)
GFR calc Af Amer: >60 mL/min (ref >60)
GFR calc non Af Amer: 55 mL/min — ABNORMAL LOW (ref >60)
Glucose, Bld: 167 mg/dL — ABNORMAL HIGH (ref 70–99)
Potassium: 3.9 mEq/L (ref 3.5–5.1)
Sodium: 137 mEq/L (ref 135–145)

## 2010-07-17 LAB — DIFFERENTIAL
Basophils Absolute: 0 10*3/uL (ref 0.0–0.1)
Basophils Relative: 0 % (ref 0–1)
Eosinophils Absolute: 0.4 10*3/uL (ref 0.0–0.7)
Eosinophils Relative: 4 % (ref 0–5)
Lymphocytes Relative: 31 % (ref 12–46)
Lymphs Abs: 3.3 10*3/uL (ref 0.7–4.0)
Monocytes Absolute: 0.7 10*3/uL (ref 0.1–1.0)
Monocytes Relative: 7 % (ref 3–12)
Neutro Abs: 6.2 10*3/uL (ref 1.7–7.7)
Neutrophils Relative %: 59 % (ref 43–77)

## 2010-07-17 LAB — CBC
HCT: 47.2 % (ref 39.0–52.0)
Hemoglobin: 16.4 g/dL (ref 13.0–17.0)
MCHC: 34.7 g/dL (ref 30.0–36.0)
MCV: 98.8 fL (ref 78.0–100.0)
Platelets: 176 10*3/uL (ref 150–400)
RBC: 4.78 MIL/uL (ref 4.22–5.81)
RDW: 13.1 % (ref 11.5–15.5)
WBC: 10.6 10*3/uL — ABNORMAL HIGH (ref 4.0–10.5)

## 2010-07-17 LAB — TYPE AND SCREEN
ABO/RH(D): B POS
Antibody Screen: NEGATIVE

## 2010-08-25 NOTE — Assessment & Plan Note (Signed)
[facility]                            CARDIOLOGY OFFICE NOTE   NAME:[name], [name]                      MRN:          [id number]  DATE:09/02/2006                            DOB:          July 07, 1933    Sean Lozano is doing very well.  When I saw him in November 2007, he was  cleared to have surgery by Dr. Jordan Likes.  This went very well.  We held his  Plavix and aspirin ahead of time and he was stable.  He had his surgery  and his meds were restarted.  He has had no difficulties.  He has had a  very good result from his surgery.  His Taxus stent was placed in April  of 2007.   PAST MEDICAL HISTORY:   ALLERGIES:  CODEINE.  He feels poorly with ZOCOR and VYTORIN.  He  tolerates Pravachol.   MEDICATIONS:  Plavix, aspirin, Lopressor, Flomax, Advair, Pravachol,  Naproxen, Tylenol and Nexium.   OTHER MEDICAL PROBLEMS:  See the list on my note of March 08, 2006.   REVIEW OF SYSTEMS:  He is feeling fine and has no significant  complaints.   PHYSICAL EXAM:  Weight is 226, down 6 pounds from his last visit.  Blood  pressure 126/78 with a pulse of 80.  The patient is oriented to person,  time and place.  Affect is normal.  HEENT:  Reveals no xanthelasma.  He has normal extraocular motion.  His  conjunctivae are normal.  There are no carotid bruits.  There is no  jugular venous distention.  LUNGS:  Clear.  Respiratory effort is not labored.  CARDIAC EXAM:  Reveals an S1 with an S2.  There are no clicks or  significant murmurs.  ABDOMEN:  Soft.  There are normal bowel sounds.  He has no peripheral edema.   EKG shows normal sinus rhythm with first degree AV block.   PROBLEMS:  Are listed and reviewed on the note of March 08, 2006.   #8.  Paresthesias and difficulty from cervical spine disease, needing  surgery that was done by Dr. Jordan Likes, successfully, and he is doing very  well.   #11.  Coronary disease:  His status is stable.  He will remain on  Plavix  and aspirin.  I will stop his Plavix at 18 months.   He is doing very well.     Sean Abed, MD, Sean Lozano  Electronically Signed    Sean Lozano  DD: 09/02/2006  DT: 09/02/2006  Job #: [id number]   cc:   Sean Levins, MD

## 2010-08-25 NOTE — Assessment & Plan Note (Signed)
[facility]                            CARDIOLOGY OFFICE NOTE   NAME:SIMONSSevag, Lozano                      MRN:          [id number]  DATE:09/08/2007                            DOB:          07/02/1933    Sean Lozano is doing well.  He continues to do well after his  neurosurgery by Dr. Renae Fickle.  He has an elevated glucose, followed by Dr.  Jonny Ruiz, and he is beginning to lose weight.  He seems motivated at this  time to do this.  This will help with all of his other parameters.  He  has not had any chest pain or shortness of breath.   PAST MEDICAL HISTORY:   ALLERGIES:  CODEINE , VYTORIN, ZOCOR, but he does tolerate Pravachol.   MEDICATIONS:  Plavix 75, Lopressor, Flomax, Advair, Pravachol, Nexium,  and aspirin.   OTHER MEDICAL PROBLEMS:  See the list on my note of March 01, 2007.   REVIEW OF SYSTEMS:  He is feeling well and his review of systems is  negative.   PHYSICAL EXAMINATION:  VITAL SIGNS:  Weight is down from 226 to 221.  Blood pressure is 122/80 with a pulse of 68.  GENERAL:  The patient is oriented to person, time, and place.  Affect is  normal.  HEENT:  Reveals no xanthelasma.  He has normal extraocular motion.  There are no carotid bruits.  There is no jugular venous distention.  LUNGS:  Clear.  Respiratory effort is nonlabored.  CARDIAC:  Reveals an S1 with an S2.  There are no clicks or significant  murmurs.  ABDOMEN:  Soft.  There is no peripheral edema.   PROBLEMS:  Listed on my note of March 01, 2007.  He is doing well.  #9.  Lipid abnormalities.  His HDL is low.  I have considered Niaspan.  However, he had difficulties with all statins other than Pravachol.  I  am hesitant to add an additional medicine.  I discussed this with him,  and we will not start Niaspan.  #11.  Coronary disease.  He is stable.  I have decided, we will continue  his Plavix 1 more year and then plan to stop it.   Followup in 6 months.     Luis Abed, MD, [facility]  Electronically Signed    JDK/MedQ  DD: 09/08/2007  DT: 09/08/2007  Job #: [id number]   cc:   Corwin Levins, MD

## 2010-08-25 NOTE — Assessment & Plan Note (Signed)
[facility]                            CARDIOLOGY OFFICE NOTE   NAME:SIMONSJacai, Kipp                      MRN:          [id number]  DATE:03/01/2007                            DOB:          11-07-1933    Mr. Hinojosa is doing very well. He had neurosurgery by Dr. Jordan Likes and he is  doing well. This took care of the paresthesias related to his cervical  spine. He is not having any chest pain. He has mild exertional shortness  of breath that is chronic, but he has had no syncope or pre-syncope.   PAST MEDICAL HISTORY:   ALLERGIES:  CODEINE, VYTORIN AND ZOCOR.   MEDICATIONS:  1. Plavix.  2. Lopressor.  3. Flomax.  4. Advair.  5. Pravachol.  6. Nexium.  7. Aspirin.   OTHER MEDICAL PROBLEMS:  See the list below.   REVIEW OF SYSTEMS:  He is doing well. He is having no significant  problems. He is going about full activities.   PHYSICAL EXAMINATION:  Weight is 226 pounds, which is stable for him.  Blood pressure 133/80, pulse 67. The patient is oriented to person, time  and place. Affect is normal.  HEENT: Reveals no xanthelasma. He has normal extraocular motion. There  are no carotid bruits. There is no jugular venous distention.  LUNGS:  Are clear. Respiratory effort is not labored.  CARDIAC: Reveals an S1, with an S2. There are no clicks or significant  murmurs.  ABDOMEN: Soft. It is protuberant. There are no masses or bruits.  He has no peripheral edema.   Mr. Schoenberg is stable.   PROBLEM LIST:  1. Difficulty with CRESTOR, ZOCOR AND VYTORIN IN THE PAST, but he      tolerates Pravachol. He will have a fasting lipid today.  2. History of some Barrett's changes in his esophagus.  3. Arthritis.  4. History of surgery for removal of the coccyx bone.  5. Asthmatic bronchitis.  6. Gastroesophageal reflux disease.  7. History of prostatitis.  8. Major paresthesias and difficulty with his cervical spine for which      he had surgery by Dr. Julio Sicks and he is doing very well.  9. Lipid abnormalities, treated.  10.History of some depression over time, stable.  11.Coronary disease. He is post coronary artery bypass graft in 1992.      He is post TAXUS stent to his native left anterior descending      artery after the left internal mammary artery insertion in April of      2007 and this intervention was done through the left internal      mammary artery. He is doing very well. We once again talked about      the pros and cons of ongoing Plavix. He is greater than one year,      but I would like to treat him for in the 2-3 year range. He will      remain on his Plavix.   I will see him back in six months.     Luis Abed, MD, [facility]  Electronically  Signed    JDK/MedQ  DD: 03/01/2007  DT: 03/01/2007  Job #: [id number]   cc:   Corwin Levins, MD

## 2010-08-25 NOTE — Assessment & Plan Note (Signed)
[facility]                            CARDIOLOGY OFFICE NOTE   NAME:Sean Lozano, Sean Lozano                      MRN:          [id number]  DATE:03/29/2008                            DOB:          24-Nov-1933    [name] is here for Cardiology followup.  He is doing well.  He is  not having any chest pain.  He is not having any shortness of breath.  The patient does have known coronary artery disease.  Historically, he  has had difficulty with statin drugs, but he has been tolerating  Pravachol.  It is now time to address his HDL.  See the discussion  below.   He is going about full activities.  He does not have chest pain or  shortness of breath.  There has been no syncope or presyncope.   PAST MEDICAL HISTORY:   ALLERGIES:  Codeine and he has not tolerated SIMVASTATIN and VYTORIN  well.  He does tolerate Pravachol.   MEDICATIONS:  1. Plavix 75 (to be stopped after his current prescription runs out)  2. Lopressor 12.5 b.i.d.  3. Flomax.  4. Pravachol 40.  5. Nexium.  6. Aspirin 81.   OTHER MEDICAL PROBLEMS:  See the list below.   REVIEW OF SYSTEMS:  He is not having any GI or GU symptoms.  He is not  having any headaches.  There are no skin rashes.  He has some mild  fatigue, which is not out of the ordinary for him.  Otherwise, his  review of systems is negative.   PHYSICAL EXAMINATION:  VITAL SIGNS:  Blood pressure 116/80 with a pulse  of 60.  GENERAL:  The patient is oriented to person, time, and place.  Affect is  normal.  HEENT:  No xanthelasma.  He has normal extraocular motion.  NECK:  There are no carotid bruits.  There is no jugular venous  distention.  LUNGS:  Clear.  Respiratory effort is not labored.  CARDIAC:  S1 with an S2.  There are no clicks or significant murmurs.  ABDOMEN:  Soft.  EXTREMITIES:  He has no peripheral edema.   EKG is done today and reviewed by me.  He has sinus rhythm.  There is  first-degree AV block.   There are no significant changes.  I have re-  reviewed his labs done last in February 2009.  His LDL was under  reasonable control.  His HDL, however, was 25.   PROBLEMS:  #1.  Difficulty with Crestor, Zocor, and Vytorin in the past.  He tolerates Pravachol.  #2.  History of Barrett's changes in his esophagus historically.  #3.  Arthritis.  #4.  History of surgery for removal of his coccyx bone.  #5.  Asthmatic bronchitis, stable.  #6.  Gastroesophageal reflux disease, stable.  #7.  History of prostatitis, stable.  #8.  Major paresthesias for which he had surgery by Dr. Julio Sicks full  and he has done very well.  #9.  History of depression that is stable.  #11.  Coronary artery disease with coronary artery bypass graft in 1992.  He received a Taxus stent to his native left anterior descending after  the left internal mammary artery insertion site in April 2007.  It is  now greater than 2 years and it is okay to stop his Plavix and his  Plavix will be stopped.  #12.  Addressing his low HDL.  Now that we know that he is tolerating  Pravachol on an ongoing basis, I explained to him the importance of  trying to bring his HDL up.  We will start low-dose Niaspan.  We will  try to titrate it upwards if possible.   I will see him back for Cardiology followup in 6 months.     Sean Abed, MD, [facility]  Electronically Signed    JDK/MedQ  DD: 03/29/2008  DT: 03/30/2008  Job #: [id number]   cc:   Sean Levins, MD

## 2010-08-28 NOTE — Op Note (Signed)
Sean Lozano, GERVASI               ACCOUNT NO.:  [id number]   MEDICAL RECORD NO.:  [id number]          PATIENT TYPE:  INP   LOCATION:  2899                         FACILITY:  [facility]   PHYSICIAN:  Kathaleen Maser. Pool, M.D.    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   PROCEDURE NAME:  C3-4 anterior cervical diskectomy and fusion with  allograft and anterior plating.   SURGEON:  Kathaleen Maser. Pool, M.D.   ASSISTANT:  Reinaldo Meeker, M.D.   ANESTHESIA:  General endotracheal.   PREMEDICATION:  Mr.  Gries is a 77 year old male with history of  progressive cervical myelopathy.  Workup demonstrates evidence of  critical stenosis at C3-4 secondary to broad-based disk herniation and  associated spondylosis.  The patient presents now for anterior cervical  decompression fusion in hopes of improving his symptoms.   OPERATIVE NOTE:  Patient taken operating room, placed on table in supine  position.  After adequate level anesthesia was achieved the patient  positioned supine with neck slightly extended.  The patient's anterior  cervical regions prepped and sterilely.  10 blade used to make a linear  incision overlying the C3-4 interspace.  This was carried down sharply  to the platysma.  Platysma was entered, divided vertically and  dissection proceeded along the medial border of the sternocleidomastoid  muscle and carotid sheath.  Trachea and esophagus were mobilized and  retracted towards the left.  Prevertebral fascia stripped off the  anterior spinal column.  Longus coli muscle then elevated bilaterally  using electrocautery.  Deep self-retaining retractor was placed.  Intraoperative fluoroscopy was used and the C3-4 level was confirmed.  Anterior osteophytes and then removed using Leksell rongeur.  Disk  space  incised 15 blade in rectangular fashion.  Wide disk space clean-out was  achieved using pituitary rongeurs, forward and backward angled Carlens  curettes, Kerrison rongeurs, and high-speed drill.  All elements of the  disk were removed down to the posterior annulus.  Microscope was brought  to the field and used throughout the remainder of the diskectomy.  Remaining aspects of annulus and osteophytes were removed using the high-  speed drill down to the level of the posterior longitudinal ligament.  Posterior longitudinal ligament was elevated and resected piecemeal  fashion using Kerrison rongeurs.  Underlying thecal sac was identified.  Wide central decompression then performed by carefully the undercutting  the bodies of C3 and C4.  Decompression proceeded to each neural  foramen.  Wide anterior foraminotomies were then performed along the  course of exiting C4 nerve roots bilaterally.  At this point a very  thorough decompression had been achieved.  There is no injury to thecal  sac or nerve roots.  Wound was then irrigated antibiotic solution.  Gelfoam was placed topically for hemostasis which was found to be good.  A 6 mm Lifenet allograft wedge was then impacted into place and recessed  approximately 1  mL from the anterior cortical margin.  A 25 mm Atlantis  anterior cervical plate was then placed over the C3-C4 levels.  This  then attached under fluoroscopic guidance using 14 mL variable angle  screws, two each at both levels.  All four screws given final tightening  and found to be solid in bone.  Locking screws engaged both levels.  Final images revealed good position bone graft, hardware, proper  operative level with normal alignment of spine.  Wound was then  irrigated with antibiotic solution.  Gelfoam was placed topically for  hemostasis which was found to be good.  Microscope and retractors were  removed.  Hemostasis muscle achieved with electrocautery. Wound was   closed in typical fashion.  Steri-Strips sterile dressing were applied.  There were no apparent complications.  The patient tolerated the  procedure well and he returned to recovery room postoperative.           ______________________________  Kathaleen Maser. Pool, M.D.     HAP/MEDQ  D:  03/22/2006  T:  03/22/2006  Job:  [id number]

## 2010-08-28 NOTE — Op Note (Signed)
NAME:  Sean Lozano, MALEK                         ACCOUNT NO.:  [id number]   MEDICAL RECORD NO.:  [id number]                   PATIENT TYPE:  OBV   LOCATION:  0466                                 FACILITY:  [facility]   PHYSICIAN:  Jene Every, M.D.                 DATE OF BIRTH:  [birth year]   DATE OF PROCEDURE:  02/17/2002  DATE OF DISCHARGE:                                 OPERATIVE REPORT   PREOPERATIVE DIAGNOSES:  Left tib fib fracture.   POSTOPERATIVE DIAGNOSES:  Left tib fib fracture.   PROCEDURE:  Left closed reduction tib fib left tibia intermedullary rodding  utilizing DePuy intermedullary rod.   ANESTHESIA:  General.   ASSISTANT:  Currie Paris, M.D.   BRIEF HISTORY:  A 77 year old with a tib-fib fracture displaced. Operative  intervention was indicated for closed reduction. Intermedullary rod and  possible ORIF of the fibula. The risks and benefits were discussed including  bleeding, infection, injury to neurovascular structures, need for hardware  removed, infection, malunion, nonunion, etc. We discussed casting versus  rodding. The patient chose the latter fully discussing the risks and  benefits.   TECHNIQUE:  The patient in supine position after an adequate level of  general anesthesia and 1 gm of Kefzol. The left lower extremity was prepped  and draped in the usual sterile fashion. Radiolucent triangles were  utilized, the knee was gently flexed, posterior aspect of the knee well  padded at all times. An incision was made from the inferior pole of the  patella to the tibial tubercle. The subcutaneous tissue was dissected,  electrocautery was utilized to achieve hemostasis. The retinaculum on the  medial aspect of the patella tendon was divided. At a point just proximal to  the tibial tubercle in line with the tibial shaft, an awl was utilized to  penetrate the tibia. This was noted to have widened. The fracture then  reduced, reduction maneuvers were  performed that actually anatomically  reduced the fibula. We placed a ball tip guide across the fracture site to  the distal physeal line and interchanged that with a straight tip probe,  measured this 36 cm length. Appropriate rotation was adjusted as well. The  rod was then inserted after sequential reaming to 11 mm with good cortical  biting, 10 mm diameter rod was selected, 36 cm in length. It was impacted  without difficulty protecting the skin proximally at all times. Proximal  locking screws were placed after the appropriate proximal incisions,  drilling under x-ray, depth gauge measure, insertion of the appropriate  length screws proximally, excellent purchase distally. Under C-arm, a  radiolucent driver  from a medial to lateral. I made an incision using a  radiolucent marker, excellent purchase was obtained. After a distal screw  was applied, the screw just proximal to that we were unable to place the  screw and therefore this was not placed and excellent  purchase was obtained  with the distal screw and it was felt that this was satisfactory. The AP and  lateral plane and the fracture site was anatomic reduction as well as the  fibula. We examined the mortis of the ankle, it was not unstable. The knee  was unremarkable as well. We copiously irrigated proximally, placing bone  wax over some cancellous bone, placed a cap screw on top of that. Copiously  irrigated the wound, closed it with #1 Vicryl interrupted figure-of-eight  sutures. The retinaculum and subcutaneous tissues were reapproximated with 2-  0 Vicryl simple sutures, skin was reapproximated with staples. Screw holes  were approximated with staples as well. I placed  the leg in a stirrup splint after well padding. The patient was then  awakened without difficulty and transported to the recovery room in  satisfactory condition.   The patient tolerated the procedure well with no complications. No  tourniquet was  utilized.                                               Jene Every, M.D.    Cordelia Pen  D:  02/17/2002  T:  02/17/2002  Job:  [id number]

## 2010-08-28 NOTE — Assessment & Plan Note (Signed)
[facility]                              CARDIOLOGY OFFICE NOTE   NAME:SIMONSJanmichael, Giraud                      MRN:          [id number]  DATE:11/10/2005                            DOB:          1933-11-16    Mr. Lenhoff is doing well.  He is not having chest pain or significant  shortness of breath.  See my complete note of August 09, 2005.  He is going  about full activities.  He does have a drug-eluting stent in place and we  talked carefully about the need for Plavix and communication with me if  there is any question of stopping it at any point.   PAST MEDICAL HISTORY:   ALLERGIES:  CODEINE and he feels poorly with ZOCOR and VYTORIN.  He is now  back on Pravachol and he, in fact, is tolerating and doing well.   MEDICATIONS:  1.  Plavix.  2.  Aspirin 325.  3.  Lopressor.  4.  Flomax.  5.  Nexium.  6.  Advair.  7.  Pravachol 40.   OTHER MEDICAL PROBLEMS:  See the complete list on my note of August 09, 2005.   REVIEW OF SYSTEMS:  He gets some numbness in his legs when sitting still.  He does not have pain when he is up walking.  He is doing well otherwise and  his review of systems is negative.   PHYSICAL EXAMINATION:  VITAL SIGNS:  Blood pressure is 130/76 with a pulse  of 64.  GENERAL:  The patient is oriented to person, time, and place.  Affect is  normal.  LUNGS:  Clear.  Respiratory effort is not labored.  HEENT:  No xanthelasma.  He has normal extraocular motion.  There are no  carotid bruits.  There is no jugular venous distention.  CARDIAC:  S1 with an S2.  There are no clicks or significant murmurs.  ABDOMEN:  Protuberant, but soft.  There are no masses or bruits.  He has no  significant peripheral edema.   No laboratories are done.   Problems are listed #1-12 on my note of August 09, 2005.  1.  Difficulties with Crestor, Zocor, and Vytorin.  He is now tolerating      Pravachol well.  2.  Paresthesias C5 and C6, stable.  3.   PCI with a Taxus stent in April of 2007.  I talked with him about the      need for long-term Plavix.  I talked with him about considering full-      dose aspirin for at least six months.  He will      be in touch if there is any question of his Plavix dosing.  I will see      him back in six months for cardiology follow-up.                               Luis Abed, MD, [facility]    JDK/MedQ  DD:  11/10/2005  DT:  11/10/2005  Job #:  [id number]   cc:   Corwin Levins, MD

## 2010-08-28 NOTE — H&P (Signed)
NAME:  Sean Lozano, Sean Lozano NO.:  [id number]   MEDICAL RECORD NO.:  [id number]          PATIENT TYPE:  EMS   LOCATION:  MAJO                         FACILITY:  [facility]   PHYSICIAN:  Lorain Childes, M.D. LHCDATE OF BIRTH:  05/03/1933   DATE OF ADMISSION:  07/12/2005  DATE OF DISCHARGE:                                HISTORY & PHYSICAL   PRIMARY CARDIOLOGIST:  Dr. Willa Rough   PRIMARY CARE PHYSICIAN:  Dr. Oliver Barre   CHIEF COMPLAINT:  Chest pain.   HISTORY OF PRESENT ILLNESS:  [name] is a 77 year old male with a history  of coronary artery disease.  During an episode of exertion he had sudden  onset of substernal chest pain that radiated all the way across his chest.  He did not have any nitroglycerin at home, but went to his primary care  physician's office and received oxygen which helped.  His pain lasted about  35 or 40 minutes.  It was a 5/10 and completely resolved with O2.  It  started about 6 p.m.  He says that this pain is the same as his pre bypass  chest pain.  He has not had these symptoms in greater than 15 years.  He has  had no catheterization since his bypass surgery.   PAST MEDICAL HISTORY:  1.  Status post aorto coronary bypass surgery x6 in 1992 with LIMA to LAD,      sequential SVG to OM1, OM2, and OM3, SVG to RCA, and SVG to first      diagonal.  2.  Hyperlipidemia.  3.  Gastroesophageal reflux disease.  4.  BPH.  5.  History of asthma.  6.  Osteoarthritis.  7.  Barrett's esophagus.  8.  History of prostatitis.  9.  Status post exercise Myoview in July of 2006 with an EF of 71% and      possible mild inferior ischemia, low risk study, medical therapy      recommended.   SURGICAL HISTORY:  1.  He has had left lower extremity surgery x2.  2.  Bypass surgery.  3.  Catheterization.  4.  Appendectomy.  5.  Back surgery.  6.  Tonsillectomy and adenoidectomy.  7.  EGD.   ALLERGIES:  He is allergic or intolerant to CRESTOR,  VYTORIN, CODEINE, and  ATENOLOL.   MEDICATIONS:  1.  Flomax 0.4 mg a day.  2.  Advair 50/250 mg b.i.d.  3.  Mevacor 20 mg a day.  4.  Aspirin 81 mg a day.  5.  Vitamin C daily.  6.  Nexium 40 mg daily.  7.  Multivitamin daily.  8.  OTC potassium daily.   SOCIAL HISTORY:  He lives in [place] with his wife and is retired from  [employer] estate.  He has no history of alcohol, tobacco, or drug abuse.  He  exercises three times a week.  He has been on the Atkins diet for three or  four weeks and has lost about 13 pounds.   FAMILY HISTORY:  His mother died at age 49 with a history of coronary artery  disease and diabetes.  His father died at age 75 of an aneurysm.  He has one  sister without heart disease.   REVIEW OF SYSTEMS:  Significant for chest pain described above that was  associated with some shortness of breath.  He has occasional arthralgias.  He denies hematemesis, hemoptysis, or melena.  His reflux symptoms are  pretty well controlled on the Nexium.  Review of systems is otherwise  negative.   PHYSICAL EXAMINATION:  VITAL SIGNS:  He is afebrile.  His blood pressure is  151/91 with a pulse rate of 62, respiratory rate 16, O2 saturation 96% on  room air.  GENERAL:  He is a well-developed, well-nourished white male in no acute  distress.  HEENT:  His head is normocephalic and atraumatic.  Pupils are equal, round,  and reactive to light and accommodation.  Extraocular movements intact.  Sclerae clear.  Nares without discharge.  NECK:  There is no lymphadenopathy, thyromegaly, bruit, or JVD noted.  CARDIOVASCULAR:  His heart is regular in rate and rhythm with an S1, S2.  No  significant murmur, rub, or gallop is noted.  His distal pulses are 2+ and  no femoral bruits are appreciated.  LUNGS:  Clear to auscultation bilaterally.  SKIN:  No rashes or lesions are noted.  ABDOMEN:  Soft and nontender with active bowel sounds.  He has an umbilical  hernia that is soft and reduces  easily.  EXTREMITIES:  There is no clubbing, cyanosis, or edema noted.  MUSCULOSKELETAL:  He has no joint deformity or effusions and no spine or CVA  tenderness.  NEUROLOGIC:  He is alert and oriented.  Cranial nerves II-XII grossly  intact.   Chest x-ray is pending.   EKG is sinus rhythm with a first degree AV block and a PR of 0.217.  This is  minimally different from an EKG dated November 2003.   LABORATORIES:  Pending at the time of dictation but a point of care marker  shows a myoglobin of 231, MB 2.4, and troponin I less than 0.05.   IMPRESSION:  1.  Chest pain:  The symptoms are concerning for unstable anginal pain.      Will admit to the hospital and rule out myocardial infarction.  Add      Lovenox and intravenous nitroglycerin.  Increase aspirin to 325 mg a      day.  Consider cardiac catheterization to further define his anatomy.  2.  Hyperlipidemia.  Check a lipid profile in a.m. and check LFTs.  3.  Hypertension.  Will add a low-dose beta blocker and see how he tolerates      it.  He was intolerant of atenolol in the past but I do not have the      dose.   [name] is otherwise stable and will be continued on his home  medications, watch for wheezing on the beta blocker.      Theodore Demark, P.A. [facility]    ______________________________  Lorain Childes, M.D. [facility]    RB/MEDQ  D:  07/12/2005  T:  07/13/2005  Job:  [id number]

## 2010-08-28 NOTE — Discharge Summary (Signed)
NAME:  Sean Lozano, Sean Lozano NO.:  [id number]   MEDICAL RECORD NO.:  [id number]          PATIENT TYPE:  INP   LOCATION:  6524                         FACILITY:  [facility]   PHYSICIAN:  Willa Rough, M.D.     DATE OF BIRTH:  Mar 07, 1934   DATE OF ADMISSION:  07/12/2005  DATE OF DISCHARGE:  07/15/2005                                 DISCHARGE SUMMARY   PRIMARY CARE PHYSICIAN:  Willa Rough, M.D.   PRIMARY CARE PHYSICIAN:  Penni Bombard, M.D.   DISCHARGE DIAGNOSES:  1.  Coronary artery disease, status post non-ST elevated myocardial      infarction with a troponin peak of 0.44, status post cardiac      catheterization/percutaneous coronary intervention by Charlies Constable, M.D.      [facility] on July 13, 2005.  The patient with successful percutaneous coronary      intervention of the lesion in the mid left anterior descending artery to      the left internal mammary artery graft using a Taxus drug-eluting stent      with improvement in narrowing from 90 to 0%.  The patient with an      ejection fraction of 60% at time of cardiac catheterization.  2.  Participation in the [place] drug research study.   PAST MEDICAL HISTORY:  1.  Coronary artery disease, status post coronary artery bypass graft      surgery in 1992.  Coronary artery bypass graft x6. The patient LIMA to      the LAD, sequential SVG to the OM1, OM2, and OM3, SVG to the RCA, and      SVG to the first diagonal.  2.  Hyperlipidemia.  3.  GERD.  4.  BPH.  5.  Asthma.  6.  Osteoarthritis.  7.  Barrett's esophagus.  8.  History of prostatitis.  9.  Status post exercise Myoview in July of 2006 with an EF of 71% showing      possible mild inferior ischemia, however, overall low risk study,      medical therapy recommended at that time.   PAST SURGICAL HISTORY:  Left lower extremity surgery x2.  Appendectomy, back  surgery, tonsillectomy, adenoidectomy, EGD.  Intolerance to Crestor,  Vytorin, and Atenolol.   PROCEDURE:  Cardiac catheterization/PCI on July 13, 2005, by Dr. Juanda Chance.   HOSPITAL COURSE:  Mr. Cornia is a 77 year old gentleman with known history  of coronary artery disease with medical history as stated above who  presented to his primary care physician's office on day of admission  complaining of chest pain.  EMS was notified and the patient was transported  to [facility] for further evaluation.   EKG showing sinus rhythm with a first degree AV block without specific  changes from EKG dated November of 2003.  Initial point of care marker with  a troponin of less than 0.05.  The patient initially hypertensive with a  blood pressure of 151/91.  He was placed on a low dose beta blocker as  tolerated and admitted for cardiac monitoring.  Further evaluation per  cardiac catheterization.  The patient was taken to the catheterization lab  on July 13, 2005.  The patient with severe native vessel disease with 90%  ostial stenosis of the LAD, total occlusion of the circumflex artery, and  total occlusion of the right coronary artery.  The patient had patent vein  graft to the first marginal, second marginal, and posterolateral branch of  the circumflex.  He also had a patent vein graft to the diagonal branch of  the LAD.  The vein graft to the distal right coronary artery was occluded.  The LIMA graft to the LAD was patent with 90% stenosis in the mid LAD artery  after the insertion site.  The patient tolerate PCI of the lesion in the mid  LAD as stated above using a drug eluting stent.  The patient tolerated the  procedure without complications.  He will need to remain on Plavix longterm.  Post procedure day, the patient was seen by Dr. Myrtis Ser, catheterization site  stable, vital signs stable.  The patient is being discharged home after seen  by cardiac rehabilitation team.   DISCHARGE LABORATORY DATA:  White blood cell count 10.8, hemoglobin 16.1,  hematocrit 48.3, with platelet  count of 185,000.  TSH this admission 2.842.  Lipid panel; total cholesterol 151, triglycerides 90, HDL 34, LDL 99.  Baseline chemistry; sodium 136, potassium 3.7, glucose 113, BUN 12,  creatinine 1.3, AST/ALT 29/26.   DISPOSITION:  The patient is discharged home with post cardiac  catheterization discharge instructions.  Increase activity slowly.  The  patient may shower.  No tub bathing x2 days, no driving for one week.  No  lifting over 10 pounds x1 week.   Pain management; nitroglycerin as needed for chest discomfort.   DISCHARGE MEDICATIONS:  1.  Plavix 75 mg daily.  2.  Flomax 0.4 mg daily or as instructed previously.  3.  Mevacor 20 mg daily.  4.  Aspirin 325 mg daily.  5.  Lopressor 1/2 of a 25 mg tablet which is equivalent to 12.5 mg in the      a.m. and 12.5 mg p.m.  6.  Humulin as previously taken.  7.  Advair as previously taken.   FOLLOW UP:  The patient has a follow-up appointment with Dr. Myrtis Ser on April  25, at 2:45.  He is to call our office for any problems from the  catheterization site prior to this appointment.   Duration of discharge encounter; 30 minutes.      Dorian Pod, NP    ______________________________  Willa Rough, M.D.    MB/MEDQ  D:  08/17/2005  T:  08/18/2005  Job:  [id number]   cc:   Penni Bombard, MD  Fax: [phone]

## 2010-08-28 NOTE — Assessment & Plan Note (Signed)
[facility]                              CARDIOLOGY OFFICE NOTE   NAME:SIMONSTanav, Orsak                      MRN:          [id number]  DATE:03/08/2006                            DOB:          [birth year]    HISTORY OF PRESENT ILLNESS:  Mr.  Okerlund is seen for Cardiology follow up.  I know him well.  He is doing well from the cardiac viewpoint.  He is not  having any of the upper chest pain that he had that brought him into the  hospital for his stent in April 2007.  He does have significant problems  with his spinal disease affecting his cervical spine.  He has pain and in  addition is having muscle weakness in his right arm.  It is felt that it is  very important to proceed with surgery for this.  He is in today to discuss  this completely with me.   The patient had his intervention with a drug eluting stent in April 2007.   ALLERGIES:  CODEINE.  HE FEELS POORLY WITH ZOCOR AND VYTORIN.  HE DOES  TOLERATE PRAVACHOL.   MEDICATIONS:  1. Plavix 75 mg (to be stopped today).  2. Aspirin 325 mg.  3. Lopressor 12.5 mg b.i.d.  4. Flomax.  5. Advair.  6. Pravachol.  7. Naproxen.  8. Nexium.   OTHER MEDICAL PROBLEMS:  See the complete list below.   REVIEW OF SYSTEMS:  The main issue is the discomfort that he has from his  cervical spine disease.  Otherwise, his review of systems is negative.   PHYSICAL EXAMINATION:  VITAL SIGNS:  Weight today 232 pounds, blood pressure  140/87, pulse 62.  GENERAL:  The patient is oriented to person, place and time.  Affect is  normal.  HEENT:  He has normal extraocular motion.  There are no carotid bruits.  No  jugular venous distention.  LUNGS:  Clear.  Respiratory effort is not labored.  The patient has some  kyphosis.  CARDIAC:  S1, S2.  There are no clicks or significant murmurs.  ABDOMEN:  Obese, but soft.  There are no masses or bruits.  EXTREMITIES:  He has no peripheral edema.  SKIN:  No major skin  lesions.   STUDIES:  EKG reveals normal sinus rhythm with no acute changes.   PROBLEMS:  1. Difficulties with Crestor, Zocor and Vytorin, but he tolerates      Pravachol.  2. History of some Barrett changes in his esophagus.  3. Arthritis.  4. History of surgery for removal of his coccyx bone.  5. Asthmatic bronchitis.  6. GERD.  7. History of prostatitis.  8. Major paresthesias and difficulties from his cervical spine and now      need for surgery.  9. Lipid abnormalities, treated.  10.History of depression over time.  11.Coronary disease.  He underwent CABG in 1992.  He received a TAXUS      stent to his native LAD after the LIMA insertion site in April 2007.      This intervention was done through the LIMA.  ASSESSMENT:  Mr Amison is stable.  A key issue at this point is whether or  not he can undergo surgery.  I believe that hemodynamically he is stable for  this.  The major issue is Plavix.  He has been on Plavix for nine months.  He and I had a very long and careful discussion about the pros and cons of  whether his Plavix can be stopped at this time.  Understanding that he is  developing muscle weakness and that there is a definite need to proceed with  the neurosurgery, I am in agreement that we should stop his Plavix at this  point.  I have decided that rather than stopping at five days before  surgery, we will stop it at this time to be sure that he remains stable.  If  he remains stable over the next two weeks, his surgery can be done.   Therefore, he is cleared for surgery at date at least two weeks from now.  The patient is aware.  Will fax a copy of this note to Dr. Jordan Likes, and the  patient will also be in touch with Dr. Jordan Likes.  I would hope, strongly, that  the patient can be kept on aspirin throughout his surgery.  If reducing it  from 325 mg to 81 mg a week before the surgery would be helpful, I am in  agreement with this.  If Dr. Jordan Likes feels that absolutely aspirin  cannot be  continued, the aspirin can be stopped, but I would strongly urge that we do  everything possible to try to have him have his surgery on aspirin.  Also,  his aspirin and Plavix are to be restarted the day after his surgery.     Luis Abed, MD, [facility]  Electronically Signed    JDK/MedQ  DD: 03/08/2006  DT: 03/08/2006  Job #: [id number]   cc:   Henry A. Pool, M.D.  Corwin Levins, MD

## 2010-08-28 NOTE — Op Note (Signed)
NAME:  Sean Lozano, Sean Lozano                         ACCOUNT NO.:  [id number]   MEDICAL RECORD NO.:  [id number]                   PATIENT TYPE:  AMB   LOCATION:  DAY                                  FACILITY:  [facility]   PHYSICIAN:  Jene Every, M.D.                 DATE OF BIRTH:  Mar 29, 1934   DATE OF PROCEDURE:  11/19/2002  DATE OF DISCHARGE:                                 OPERATIVE REPORT   PREOPERATIVE DIAGNOSIS:  Painful retained hardware, status post  intermedullary rodding, left tibia.   POSTOPERATIVE DIAGNOSIS:  Painful retained hardware, status post  intermedullary rodding, left tibia.   OPERATION/PROCEDURE:  Remove proximal locking screw, proximal tibia.   ANESTHESIA:  Local and MAC.   SURGEON:  Jene Every, M.D.   ASSISTANT:  None.   BRIEF HISTORY:  A 77 year old status post intramedullary rodding in the  tibia has persistent pain underneath the proximal locking screw which is in  the subcutaneous area.  When he knelt upon the knee, it created significant  subcutaneous pain. Operative intervention is indicated for removal of the  proximal locking screw.  Risks and benefits have been discussed including  bleeding, infection, persistent pain.  He has actually no pain from the  remaining hardware and therefore, it is indicated not to remove that.   DESCRIPTION OF PROCEDURE:  With the patient in the supine position and after  induction of adequate MAC, left lower extremity prepped and draped in the  usual sterile fashion.  He was given 1 g of IV Kefzol.   A small 1 cm incision was made over the proximal locking screw near the  region of the tibial tubercle dissected down to the head of the screw.  There was hypertrophic bursa and tissue covering that.  We infiltrated the  area with 0.25% Marcaine with epinephrine and divided the subcutaneous  tissue deep with a knife and a cautery, removed soft tissue from the head of  the screw and then we engaged it with a large  screwdriver, removing  it  without difficulty.  The screw was inspected and found to be intact in its  entirety.  Therefore, I then curetted the screw halfway with a curet,  irrigated it copiously with antibiotic irrigation.  Following this, I closed  the incision with 4-0 nylon simple sutures, placed a compression dressing  upon the surgical site.  The patient was then transported to the recovery  room in satisfactory condition.   The patient tolerated the procedure well.  There were no complications.                                                Jene Every, M.D.    Cordelia Pen  D:  11/19/2002  T:  11/19/2002  Job:  [id number]  

## 2010-08-28 NOTE — Discharge Summary (Signed)
NAME:  Sean Lozano, Sean Lozano                         ACCOUNT NO.:  [id number]   MEDICAL RECORD NO.:  [id number]                   PATIENT TYPE:  INP   LOCATION:  0466                                 FACILITY:  [facility]   PHYSICIAN:  Jene Every, M.D.                 DATE OF BIRTH:  1933/06/18   DATE OF ADMISSION:  02/17/2002  DATE OF DISCHARGE:  02/20/2002                                 DISCHARGE SUMMARY   ADMISSION DIAGNOSES:  1. Left tibial fibular fracture.  2. Coronary artery disease.  3. Gastroesophageal reflux disease.  4. Benign prostatic hypertrophy.   DISCHARGE DIAGNOSES:  1. Status post rodding of the left tibia fracture with closed reduction of     the left fibula fracture.  2. Coronary artery disease.  3. Gastroesophageal reflux disease.  4. Benign prostatic hypertrophy.  5. Mild hypokalemia.   PROCEDURE:  The patient was taken to the operating room on February 17, 2002  and underwent a rodding of the left tibia fracture.  Surgeon was Jene Every, M.D.  Assistant Roma Schanz, P.A.-C.  Surgery was done under  general anesthesia without any complications.   CONSULTS:  PT/OT.   BRIEF HISTORY:  The patient is an 77 year old male who on November 8 fell  from a 2 foot ladder.  When this happened his left leg went through an egg  crate.  He twisted that leg and fell.  At that time he noticed pain and  deformity in the left lower extremity.  He was brought to [facility]  Emergency Room where x-rays revealed a left tibial fibular fracture.  Treatment options were discussed with the patient, casting versus surgical  repair.  The patient opted for rodding of the femur fracture.  The patient  felt surgical intervention was best for him.  The patient was subsequently  admitted to the hospital where he underwent the above stated procedure.   LABORATORY DATA:  CBC on admission showed hemoglobin 16.1, hematocrit 47.5,  white blood cell count 13.0.  Serial H&Hs were  followed throughout the  hospital course.  The hemoglobin did decline to 13.7, hematocrit 40.4.  The  differential and CBC was all within normal limits.  PT/INR at time of  admission was 13.5/1.0.  Serial PT/INR were followed.  At time of discharge  the patient was therapeutic with a PT of 21.1, INR of 2.1.  Chemistry panel  on admission showed elevated glucose of 113, low BUN of 26.  At time of  discharge patient did have some mild hyponatremia at 132, mild hypokalemia  at 3.2.  BUN had stabilized at 18, creatinine of 1.1.  Urinalysis at the  time of admission was negative.  The patient's blood type is B+.  A two view  chest x-ray done at time of admission showed no active disease.  EKG done at  time of admission showed normal sinus rhythm.  Initial  x-rays done of the  left tibia fibula show a spiral fracture of the distal tibia diathesis and  there is a highly comminuted distal fibula fracture involving the distal  metadiaphysis just above the tibial plafond.  Fracture fragments remained in  their anatomic alignment at that point.   HOSPITAL COURSE:  The patient was admitted to [facility] and taken  to the OR.  He underwent the above stated procedure without complications.  The patient tolerated the procedure well and was allowed to return to the  recovery room and then to the orthopedic floor to continue postoperative  care.  The patient was placed on morphine PCA for analgesic pain control  following surgery.  The PCA was kept until time of discharge when the  patient eventually was weaned to p.o. analgesics.  This seemed to control  his pain well.  The hemoglobin and hematocrit were followed throughout his  hospital course.  Chemistry levels were also followed which indicated some  mild hyponatremia and hypokalemia.  The patient was subsequently treated  with fluid restrictions and potassium supplements.  Throughout hospital  course patient's motor and neurovascular function  remained intact to the  left lower extremity.  The patient at time of surgery was placed in a  plaster splint.  Throughout the hospital course this remained clean, dry,  and intact.  PT/OT was consulted for patient ambulating with  nonweightbearing to the left lower extremity.  The patient seemed to advance  slowly with this due to fatigue, but otherwise did well.  DVT prophylaxis  was initiated with Coumadin dose per pharmacy with serial PT/INRs followed.  At time of discharge patient's pain was well controlled on p.o. medications.  Coumadin level was therapeutic.  The patient felt fairly comfortable with  ambulation with nonweightbearing to the left lower extremity.  The patient  was discharged home.   DISCHARGE MEDICATIONS:  1. Mepergan Fortis one to two p.o. q.4-6h. p.r.n. pain.  2. Robaxin 500 mg one p.o. q.6h. p.r.n. spasm.  3. Coumadin dosed per pharmacy.  4. Resume all home medications.   DIET:  As tolerated.   ACTIVITY:  The patient will be nonweightbearing to the left lower extremity.   FOLLOW UP:  The patient will call our office for an appointment with Dr.  Shelle Iron in 10-14 days.   CONDITION ON DISCHARGE:  Improved.     Roma Schanz, P.A.                   Jene Every, M.D.    CS/MEDQ  D:  02/20/2002  T:  02/20/2002  Job:  [id number]

## 2010-08-28 NOTE — H&P (Signed)
NAME:  Sean Lozano, Sean Lozano                         ACCOUNT NO.:  [id number]   MEDICAL RECORD NO.:  [id number]                   PATIENT TYPE:  OBV   LOCATION:  0466                                 [facility]ACILITY[facility]   PHYSICIAN:  Jene Every, M.D.                 DATE OF BIRTH:  April 03, 1934   DATE OF ADMISSION:  02/17/2002  DATE OF DISCHARGE:                                HISTORY & PHYSICAL   HISTORY OF PRESENT ILLNESS:  The patient is an 77 year old male who earlier  in the day fell from a two foot ladder.  When he did this, he stepped in an  egg crate on the left lower extremity.  He twisted and fell.  At that time  he noticed pain in his left lower extremity and noticed the deformity.  He  was brought to the [facility] Emergency Room, where x-rays revealed a left  tib-fib fracture.  Treatment options were discussed with the patient at that  time, casting versus surgical repair.  The patient felt he would benefit  from more from a rodding of the fracture.  Risks and benefits were discussed  with the patient.  He agreed to proceed with the procedure.   PAST MEDICAL HISTORY:  1. Significant for coronary artery disease.  2. Gastroesophageal reflux disease.  3. Benign prostatic hypertrophy.   PAST SURGICAL HISTORY:  1. Significant for cardiac bypass x6 in 1997.  2. Appendectomy.  3. Coccyx removal.  4. Tonsillectomy.   ALLERGIES:  CODEINE.   CURRENT MEDICATIONS:  1. Nexium.  2. Singulair.  3. Pravachol.  4. Flomax.  5. Viagra p.r.n.  6. Aspirin two p.o. q.d.   REVIEW OF SYSTEMS:  GENERAL:  The patient denies any fever, chills, night  sweats, or bleeding tendencies.  HEENT:  The patient denies any blurred or  double vision, seizures, headache, or paralysis.  CARDIOVASCULAR:  The  patient denies chest pain, shortness of breath, orthopnea.  GENITOURINARY:  The patient denies dysuria, hematuria, or discharge.  GASTROINTESTINAL:  The  patient denies nausea, vomiting, diarrhea,  constipation, or melena.  MUSCULOSKELETAL:  As pertinent to HPI.   PHYSICAL EXAMINATION:  VITAL SIGNS:  Pulse is 70, respiratory rate 20, blood  pressure 136/71.  GENERAL:  This is an alert, well-nourished, well-developed, 77 year old male  in no acute distress.  NECK:  Supple.  No lymphadenopathy is noted.  CHEST:  Clear to auscultation bilaterally.  No rhonchi, wheezes, or rales.  There is a well-healed midsternal incision.  HEART:  Regular rate and rhythm without murmurs, gallops, or rubs.  ABDOMEN:  Soft, nontender, nondistended, bowel sounds x4.  GENITOURINARY:  Not given, not pertinent to HPI.  BREASTS:  Not given, not pertinent to HPI.  EXTREMITIES:  There is obvious deformity of the left lower extremity.  The  pedal pulses are equal bilaterally.  NEUROLOGIC:  Neurovascular exam is intact.  SKIN:  No rashes  or lesions are noted.   LABORATORY DATA:  X-ray of the left lower extremity reveals the distal one  third lateral angulation oblique small fracture of the OV left tibia  comminuted  left fibular fracture with anterior angulation.   IMPRESSION:  Left tibia-fibula fracture.   PLAN OF ACTION:  Rodding of the left tibia with closed reduction of the  fibular fracture.  The patient was admitted to [facility] and  underwent the above stated procedure.  The patient's primary care physician  is Dr. Ranell Patrick.  Cardiologist is Dr. Myrtis Ser with [facility].        Roma Schanz, P.A.                   Jene Every, M.D.    CS/MEDQ  D:  02/17/2002  T:  02/17/2002  Job:  [id number]

## 2010-08-28 NOTE — Cardiovascular Report (Signed)
NAME:  GRAESON, NOURI NO.:  [id number]   MEDICAL RECORD NO.:  [id number]          PATIENT TYPE:  INP   LOCATION:  6524                         FACILITY:  [facility]   PHYSICIAN:  Charlies Constable, M.D. [facility] DATE OF BIRTH:  April 22, 1933   DATE OF PROCEDURE:  07/13/2005  DATE OF DISCHARGE:                              CARDIAC CATHETERIZATION   CLINICAL HISTORY:  Mr. Hocker is 77 years old and had bypass surgery  performed in 1992.  He has not been studied since that time.  He was  admitted with a non-ST elevation infarction and he was enrolled in the [facility]-  ACS trial.   PROCEDURE:  The procedure was performed by the right femoral artery using  arterial sheath and 6-French preformed coronary catheters.  A right coronary  JR-4 was used for injection of a LIMA catheter.  A left bypass graft  catheter was used for injection of the graft to the diagonal branch of the  LAD.  A right bypass graft catheter was used for injection of the graft to  the right coronary artery.   After completion of the diagnostic study, we made a decision to proceed with  intervention on the lesion in the mid-LAD after the insertion of the LIMA  graft.  The patient was on SEPIA trial and had been randomized to either  unfractionated heparin and Integrilin versus SEPIA study drug.  Additional  anticoagulant was given to increase his ACT to target range.  We used a 6-  [place] internal mammary guiding catheter with side holes.  We used to a PT2  light support wire.  We crossed the lesion in the mid-LAD through the LIMA  graft without too much difficulty.  We predilated with a 2.5 x 12 mm  Maverick performing two inflations up to 8 atmospheres for 30 seconds.  We  then deployed a 2.5 x 60 mm Taxus stent, deploying this with one inflation  of 8 atmospheres for 30 seconds.  We felt the stent was somewhat oversized  for the vessel and only went to 8 atmospheres.  We then postdilated with a  2.5 x 12 mm Quantum  Maverick performing one inflation up to 15 atmospheres  for 30 seconds.  Final diagnostic study was then performed with a guiding  catheter.   The patient developed hypotension in the very last portion of the procedure  requiring dopamine temporarily but this was able to be tapered off before  the patient left the catheter lab.  Overall, he tolerated the procedure well  and left the laboratory in satisfactory condition.   RESULTS:  The left main coronary artery:  The left main coronary artery was  free of significant disease.   The left anterior descending artery:  The left anterior descending artery  had a 90% ostial stenosis and then gave rise to a septal perforator and  there was competing flow distally.   The circumflex artery:  The circumflex artery was completely occluded at its  midportion.   The right coronary artery:  The right coronary artery was completely  occluded at its proximal portion.  The distal right coronary artery filled  via collaterals from the circumflex artery.   The saphenous vein graft to the first marginal, second marginal, and  posterolateral branches of the circumflex artery was patent and functioned  normally.   The saphenous vein graft to the diagonal branch to the LAD was patent and  functioned normally.  This was a small vessel.  The saphenous vein graft to  the distal right coronary artery was completely occluded proximally.  There  was some hang up of dye suggesting a recent occlusion.   The LIMA graft to the LAD was patent and functioned normally.  There was a  90% stenosis in the mid-LAD not too far after the insertion site.   The left ventriculogram performed in the RAO projection showed good wall  motion with no areas of hypokinesis.  The ejection fraction was 60%.   Following stent of the lesion in the mid-left anterior descending artery  stenosis improved from 90% to 0%.   The aortic pressure was 101/62 with a mean of 79 and a left  ventricular  pressure was 101/14.   CONCLUSION:  1.  Coronary artery disease status post coronary artery bypass graft surgery      in 1992.  2.  Severe native vessel disease with 90% ostial stenosis of the left      anterior descending artery, total occlusion of the circumflex artery and      total occlusion of the right coronary artery.  3.  Patent vein graft to the first marginal, second marginal and      posterolateral branch of the circumflex artery, patent vein graft to the      diagonal branch of the left anterior descending artery, occluded vein      graft to the distal right coronary artery, and patent left internal      mammary artery graft to the left anterior descending artery with 90%      stenosis in the mid-left anterior descending artery after the insertion      site.  4.  Normal left ventricular function with an estimated ejection fraction of      60%.  5.  Successful percutaneous coronary intervention of the lesion in the mid-      left anterior descending artery through the left internal mammary artery      graft using a Taxus drug-eluting stent with improvement in narrowing      from 90% to 0%.   DISPOSITION:  The patient returned to postoperative unit for further  observation.  He should remain on Plavix long-term.           ______________________________  Charlies Constable, M.D. [facility]     BB/MEDQ  D:  07/13/2005  T:  07/14/2005  Job:  [id number]   cc:   Willa Rough, M.D.  [address]  [address]  [place]  [place] [address]   Corwin Levins, M.D. [facility]  [address]  [place]  [place] [address]   Cardiopulmonary Lab

## 2010-09-09 ENCOUNTER — Other Ambulatory Visit: Payer: Self-pay | Admitting: Internal Medicine

## 2010-09-21 ENCOUNTER — Other Ambulatory Visit: Payer: Self-pay | Admitting: Internal Medicine

## 2010-09-24 ENCOUNTER — Encounter: Payer: Self-pay | Admitting: Cardiology

## 2010-09-28 ENCOUNTER — Encounter: Payer: Self-pay | Admitting: Internal Medicine

## 2010-09-29 ENCOUNTER — Ambulatory Visit (INDEPENDENT_AMBULATORY_CARE_PROVIDER_SITE_OTHER): Payer: Medicare Other | Admitting: Internal Medicine

## 2010-09-29 ENCOUNTER — Telehealth: Payer: Self-pay

## 2010-09-29 ENCOUNTER — Encounter: Payer: Self-pay | Admitting: Internal Medicine

## 2010-09-29 ENCOUNTER — Ambulatory Visit (INDEPENDENT_AMBULATORY_CARE_PROVIDER_SITE_OTHER)
Admission: RE | Admit: 2010-09-29 | Discharge: 2010-09-29 | Disposition: A | Discharge status: Home / Self Care | Payer: Medicare Other | Source: Ambulatory Visit | Attending: Internal Medicine | Admitting: Internal Medicine

## 2010-09-29 ENCOUNTER — Other Ambulatory Visit (INDEPENDENT_AMBULATORY_CARE_PROVIDER_SITE_OTHER): Payer: Medicare Other

## 2010-09-29 VITALS — BP 138/80 | HR 70 | Temp 96.7°F | Ht 71.0 in | Wt 227.2 lb

## 2010-09-29 DIAGNOSIS — R5383 Other fatigue: Secondary | ICD-10-CM

## 2010-09-29 DIAGNOSIS — E119 Type 2 diabetes mellitus without complications: Secondary | ICD-10-CM

## 2010-09-29 DIAGNOSIS — N320 Bladder-neck obstruction: Secondary | ICD-10-CM

## 2010-09-29 DIAGNOSIS — R5381 Other malaise: Secondary | ICD-10-CM

## 2010-09-29 DIAGNOSIS — R079 Chest pain, unspecified: Secondary | ICD-10-CM

## 2010-09-29 DIAGNOSIS — R0609 Other forms of dyspnea: Secondary | ICD-10-CM

## 2010-09-29 DIAGNOSIS — R0600 Dyspnea, unspecified: Secondary | ICD-10-CM

## 2010-09-29 DIAGNOSIS — R0989 Other specified symptoms and signs involving the circulatory and respiratory systems: Secondary | ICD-10-CM

## 2010-09-29 DIAGNOSIS — Z0000 Encounter for general adult medical examination without abnormal findings: Secondary | ICD-10-CM | POA: Insufficient documentation

## 2010-09-29 HISTORY — DX: Bladder-neck obstruction: N32.0

## 2010-09-29 HISTORY — PX: OTHER SURGICAL HISTORY: SHX169

## 2010-09-29 LAB — CBC WITH DIFFERENTIAL/PLATELET
Basophils Absolute: 0 10*3/uL (ref 0.0–0.1)
Basophils Relative: 0.4 % (ref 0.0–3.0)
Eosinophils Absolute: 0.2 10*3/uL (ref 0.0–0.7)
Eosinophils Relative: 2.7 % (ref 0.0–5.0)
HCT: 47.6 % (ref 39.0–52.0)
Hemoglobin: 16.3 g/dL (ref 13.0–17.0)
Lymphocytes Relative: 36.7 % (ref 12.0–46.0)
Lymphs Abs: 3.4 10*3/uL (ref 0.7–4.0)
MCHC: 34.2 g/dL (ref 30.0–36.0)
MCV: 99.7 fl (ref 78.0–100.0)
Monocytes Absolute: 0.7 10*3/uL (ref 0.1–1.0)
Monocytes Relative: 7.2 % (ref 3.0–12.0)
Neutro Abs: 4.9 10*3/uL (ref 1.4–7.7)
Neutrophils Relative %: 53 % (ref 43.0–77.0)
Platelets: 184 10*3/uL (ref 150.0–400.0)
RBC: 4.77 Mil/uL (ref 4.22–5.81)
RDW: 13.3 % (ref 11.5–14.6)
WBC: 9.3 10*3/uL (ref 4.5–10.5)

## 2010-09-29 LAB — URINALYSIS, ROUTINE W REFLEX MICROSCOPIC
Bilirubin Urine: NEGATIVE
Hgb urine dipstick: NEGATIVE
Ketones, ur: NEGATIVE
Leukocytes, UA: NEGATIVE
Nitrite: NEGATIVE
Specific Gravity, Urine: 1.02 (ref 1.000–1.030)
Total Protein, Urine: NEGATIVE
Urine Glucose: NEGATIVE
Urobilinogen, UA: 0.2 (ref 0.0–1.0)
pH: 6 (ref 5.0–8.0)

## 2010-09-29 LAB — HEPATIC FUNCTION PANEL
ALT: 22 U/L (ref 0–53)
AST: 23 U/L (ref 0–37)
Albumin: 4.5 g/dL (ref 3.5–5.2)
Alkaline Phosphatase: 50 U/L (ref 39–117)
Bilirubin, Direct: 0.1 mg/dL (ref 0.0–0.3)
Total Bilirubin: 0.7 mg/dL (ref 0.3–1.2)
Total Protein: 7.2 g/dL (ref 6.0–8.3)

## 2010-09-29 LAB — LIPID PANEL
Cholesterol: 146 mg/dL (ref 0–200)
HDL: 30.3 mg/dL — ABNORMAL LOW (ref >39.00)
LDL Cholesterol: 79 mg/dL (ref 0–99)
Total CHOL/HDL Ratio: 5
Triglycerides: 186 mg/dL — ABNORMAL HIGH (ref 0.0–149.0)
VLDL: 37.2 mg/dL (ref 0.0–40.0)

## 2010-09-29 LAB — BASIC METABOLIC PANEL
BUN: 16 mg/dL (ref 6–23)
CO2: 30 mEq/L (ref 19–32)
Calcium: 9.6 mg/dL (ref 8.4–10.5)
Chloride: 106 mEq/L (ref 96–112)
Creatinine, Ser: 1 mg/dL (ref 0.4–1.5)
GFR: 77.34 mL/min (ref >60.00)
Glucose, Bld: 127 mg/dL — ABNORMAL HIGH (ref 70–99)
Potassium: 4.7 mEq/L (ref 3.5–5.1)
Sodium: 141 mEq/L (ref 135–145)

## 2010-09-29 LAB — TSH: TSH: 1.94 u[IU]/mL (ref 0.35–5.50)

## 2010-09-29 LAB — HEMOGLOBIN A1C: Hgb A1c MFr Bld: 7 % — ABNORMAL HIGH (ref 4.6–6.5)

## 2010-09-29 LAB — PSA: PSA: 1.3 ng/mL (ref 0.10–4.00)

## 2010-09-29 MED ORDER — FLUTICASONE-SALMETEROL 250-50 MCG/DOSE IN AEPB: 1.0000 | INHALATION_SPRAY | Freq: Two times a day (BID) | RESPIRATORY_TRACT | Status: DC

## 2010-09-29 MED ORDER — MONTELUKAST SODIUM 10 MG PO TABS: 10.0000 mg | ORAL_TABLET | Freq: Every day | ORAL | Status: DC

## 2010-09-29 MED ORDER — ALBUTEROL SULFATE HFA 108 (90 BASE) MCG/ACT IN AERS: 2.0000 | INHALATION_SPRAY | Freq: Four times a day (QID) | RESPIRATORY_TRACT | Status: DC | PRN

## 2010-09-29 NOTE — Progress Notes (Signed)
Subjective:    Patient ID: Sean Lozano, male    DOB: 1933-12-21, 77 y.o.   MRN: [id number]  HPI  Here for wellness and f/u;  Overall doing ok;  Pt denies CP, worsening SOB, DOE, wheezing, orthopnea, PND, worsening LE edema, palpitations, dizziness or syncope.  Pt denies neurological change such as new Headache, facial or extremity weakness.  Pt denies polydipsia, polyuria, or low sugar symptoms. Pt states overall good compliance with treatment and medications, good tolerability, and trying to follow lower cholesterol diet.  Pt denies worsening depressive symptoms, suicidal ideation or panic. No fever, wt loss, night sweats, loss of appetite, or other constitutional symptoms.  Pt states good ability with ADL's, low fall risk, home safety reviewed and adequate, no significant changes in hearing or vision, and occasionally active with exercise.  Due to f/u with card/Dr Myrtis Ser in Aug 2012, today to c/o sob/doe/anterior chest discomfort (no assoc symptoms, no radiation, no n/v, diaphoresis, palp, syncope)  usually occurs at 1 MPH on the treadmill at 4 min, stops to rest, then resumes and then symptoms do not recur;  Had similar symptoms in late 90's , had card eval, then pulm eval  - dx with asthma, placed on advair and singulair and did well.  Past Medical History  Diagnosis Date  . CAD (coronary artery disease)   . MI (myocardial infarction)   . Hyperlipidemia     intolerance to Crestor Zocor Vytorin. He tolerates Pravachol...low HDL  . GERD (gastroesophageal reflux disease)   . Rosacea   . Prostatitis   . Depression   . Benign prostatic hypertrophy   . Asthma   . Paresthesias     successful surgery by Dr. Julio Sicks  . Arthritis   . Diabetes mellitus     type II-diet  . Diverticulosis    Past Surgical History  Procedure Date  . C-spine disc surgery 03/2006  . C4-5 surgery 01/2009  . Cardiac catheterization     stent placed  . Coronary artery bypass graft   . Appendectomy   .  Tonsillectomy   . Coccyx fracture surgery 1997    reports that he has never smoked. He does not have any smokeless tobacco history on file. He reports that he drinks alcohol. He reports that he does not use illicit drugs. family history includes Aneurysm in an unspecified family member; Diabetes in his sister; Heart disease in his father; Hyperlipidemia in an unspecified family member; and Liver cancer in an unspecified family member. Allergies  Allergen Reactions  . Codeine   . Ezetimibe-Simvastatin   . Rosuvastatin   . Simvastatin    Current Outpatient Prescriptions on File Prior to Visit  Medication Sig Dispense Refill  . aspirin (ASPIRIN EC) 81 MG EC tablet Take 81 mg by mouth daily.        . Cholecalciferol (VITAMIN D3) 2000 UNITS TABS Take by mouth daily.        Marland Kitchen loratadine-pseudoephedrine (CLARITIN-D 24-HOUR) 10-240 MG per 24 hr tablet Take 1 tablet by mouth daily. As needed for allergies        . metoprolol tartrate (LOPRESSOR) 25 MG tablet Take 12.5 mg by mouth 2 (two) times daily.        Marland Kitchen NEXIUM 40 MG capsule TAKE 1 CAPSULE EVERY DAY  30 capsule  1  . niacin (NIASPAN) 1000 MG CR tablet Take 1,000 mg by mouth daily.        . pravastatin (PRAVACHOL) 40 MG tablet Take 40 mg by  mouth daily.        . Tamsulosin HCl (FLOMAX) 0.4 MG CAPS TAKE 1 CAPSULE BY MOUTH EVERY DAY  30 capsule  0   Review of Systems Review of Systems  Constitutional: Negative for diaphoresis, activity change, appetite change and unexpected weight change.  HENT: Negative for hearing loss, ear pain, facial swelling, mouth sores and neck stiffness.   Eyes: Negative for pain, redness and visual disturbance.  Respiratory: Negative for shortness of breath and wheezing.   Cardiovascular: Negative for chest pain and palpitations.  Gastrointestinal: Negative for diarrhea, blood in stool, abdominal distention and rectal pain.  Genitourinary: Negative for hematuria, flank pain and decreased urine volume.    Musculoskeletal: Negative for myalgias and joint swelling.  Skin: Negative for color change and wound.  Neurological: Negative for syncope and numbness.  Hematological: Negative for adenopathy.  Psychiatric/Behavioral: Negative for hallucinations, self-injury, decreased concentration and agitation.      Objective:   Physical Exam BP 138/80  Pulse 70  Temp(Src) 96.7 F (35.9 C) (Oral)  Ht 5\' 11"  (1.803 m)  Wt 227 lb 4 oz (103.08 kg)  BMI 31.69 kg/m2  SpO2 95% Physical Exam  VS noted Constitutional: Pt is oriented to person, place, and time. Appears well-developed and well-nourished.  HENT:  Head: Normocephalic and atraumatic.  Right Ear: External ear normal.  Left Ear: External ear normal.  Nose: Nose normal.  Mouth/Throat: Oropharynx is clear and moist.  Eyes: Conjunctivae and EOM are normal. Pupils are equal, round, and reactive to light.  Neck: Normal range of motion. Neck supple. No JVD present. No tracheal deviation present.  Cardiovascular: Normal rate, regular rhythm, normal heart sounds and intact distal pulses.   Pulmonary/Chest: Effort normal and breath sounds normal.  Abdominal: Soft. Bowel sounds are normal. There is no tenderness.  Musculoskeletal: Normal range of motion. Exhibits no edema.  Lymphadenopathy:  Has no cervical adenopathy.  Neurological: Pt is alert and oriented to person, place, and time. Pt has normal reflexes. No cranial nerve deficit.  Skin: Skin is warm and dry. No rash noted.  Psychiatric:  Has  normal mood and affect. Behavior is normal.         Assessment & Plan:

## 2010-09-29 NOTE — Assessment & Plan Note (Signed)
Etiology unclear, Exam otherwise benign, to check labs as documented, follow with expectant management  

## 2010-09-29 NOTE — Progress Notes (Signed)
Quick Note:  Voice message left on PhoneTree system - lab is negative, normal or otherwise stable, pt to continue same tx ______ 

## 2010-09-29 NOTE — Assessment & Plan Note (Addendum)
Hx most suspicious for asthma exac, mild - for advair 250/50 bid, and singulari, with proair hfa prn as well , and to check echo as well

## 2010-09-29 NOTE — Assessment & Plan Note (Signed)
Asyptical, suspect primary issues is the asthma, but with his hx and last stress test approx 5 yrs, will need stress test, with f/u per Dr Myrtis Ser, soon if abnormal; ecg reviewed - sinus  -NAD,  Also for cxr today

## 2010-09-29 NOTE — Assessment & Plan Note (Signed)
stable overall by hx and exam, most recent data reviewed with pt, and pt to continue medical treatment as before, for labs today  Lab Results  Component Value Date   WBC 9.8 10/03/2009   HGB 15.9 10/03/2009   HCT 46.1 10/03/2009   PLT 194.0 10/03/2009   CHOL 134 10/03/2009   TRIG 131.0 10/03/2009   HDL 33.60* 10/03/2009   LDLDIRECT 87.6 09/19/2008   ALT 20 10/03/2009   AST 21 10/03/2009   NA 141 10/03/2009   K 4.2 10/03/2009   CL 107 10/03/2009   CREATININE 1.1 10/03/2009   BUN 16 10/03/2009   CO2 29 10/03/2009   TSH 2.84 10/03/2009   PSA 1.23 10/03/2009   HGBA1C 6.9* 10/03/2009

## 2010-09-29 NOTE — Patient Instructions (Addendum)
You are given the sample of advair today Take all new medications as prescribed - the advair, and the singulair (both sent to your pharmacy), and Proair HFA inhaler Continue all other medications as before Your EKG was OK today Please go to XRAY in the Basement for the x-ray test Please go to LAB in the Basement for the blood and/or urine tests to be done today Please call the phone number [phone] (the PhoneTree System) for results of testing in 2-3 days;  When calling, simply dial the number, and when prompted enter the MRN number above (the Medical Record Number) and the # key, then the message should start. You will be contacted regarding the referral for: stress test, and echocardiogram If the stress test is abnormal, you will need to see Dr Myrtis Ser sooner than August Please return in 6 months, or sooner if needed

## 2010-09-29 NOTE — Assessment & Plan Note (Signed)
stable overall by hx and exam, most recent data reviewed with pt, and pt to continue medical treatment as before  Lab Results  Component Value Date   HGBA1C 6.9* 10/03/2009

## 2010-09-29 NOTE — Telephone Encounter (Signed)
Started prior authorization for Singulair 10 mg. Ref# [id number]., auth. Phone number [phone].

## 2010-09-30 NOTE — Telephone Encounter (Signed)
Received approval for Singulair from 09/08/2010 until 09/29/2011. Informed pharmacy.

## 2010-10-03 ENCOUNTER — Encounter: Payer: Self-pay | Admitting: Internal Medicine

## 2010-10-06 ENCOUNTER — Encounter (HOSPITAL_COMMUNITY): Payer: BC Managed Care – PPO | Admitting: Radiology

## 2010-10-07 ENCOUNTER — Other Ambulatory Visit: Payer: Self-pay | Admitting: Cardiology

## 2010-10-08 ENCOUNTER — Encounter: Payer: Self-pay | Admitting: Cardiology

## 2010-10-08 ENCOUNTER — Encounter: Payer: Self-pay | Admitting: *Deleted

## 2010-10-08 ENCOUNTER — Ambulatory Visit (HOSPITAL_BASED_OUTPATIENT_CLINIC_OR_DEPARTMENT_OTHER): Payer: Medicare Other | Admitting: Radiology

## 2010-10-08 ENCOUNTER — Ambulatory Visit (INDEPENDENT_AMBULATORY_CARE_PROVIDER_SITE_OTHER): Payer: Medicare Other | Admitting: Cardiology

## 2010-10-08 DIAGNOSIS — R0600 Dyspnea, unspecified: Secondary | ICD-10-CM

## 2010-10-08 DIAGNOSIS — E785 Hyperlipidemia, unspecified: Secondary | ICD-10-CM

## 2010-10-08 DIAGNOSIS — I079 Rheumatic tricuspid valve disease, unspecified: Secondary | ICD-10-CM | POA: Insufficient documentation

## 2010-10-08 DIAGNOSIS — E119 Type 2 diabetes mellitus without complications: Secondary | ICD-10-CM | POA: Insufficient documentation

## 2010-10-08 DIAGNOSIS — I440 Atrioventricular block, first degree: Secondary | ICD-10-CM

## 2010-10-08 DIAGNOSIS — R0609 Other forms of dyspnea: Secondary | ICD-10-CM | POA: Insufficient documentation

## 2010-10-08 DIAGNOSIS — I25810 Atherosclerosis of coronary artery bypass graft(s) without angina pectoris: Secondary | ICD-10-CM

## 2010-10-08 DIAGNOSIS — R0989 Other specified symptoms and signs involving the circulatory and respiratory systems: Secondary | ICD-10-CM

## 2010-10-08 DIAGNOSIS — Z951 Presence of aortocoronary bypass graft: Secondary | ICD-10-CM | POA: Insufficient documentation

## 2010-10-08 DIAGNOSIS — I2510 Atherosclerotic heart disease of native coronary artery without angina pectoris: Secondary | ICD-10-CM

## 2010-10-08 DIAGNOSIS — R0789 Other chest pain: Secondary | ICD-10-CM

## 2010-10-08 MED ORDER — TECHNETIUM TC 99M TETROFOSMIN IV KIT
33.0000 | PACK | Freq: Once | INTRAVENOUS | Status: AC | PRN
  Administered 2010-10-08: 33 via INTRAVENOUS

## 2010-10-08 MED ORDER — TECHNETIUM TC 99M TETROFOSMIN IV KIT
10.9000 | PACK | Freq: Once | INTRAVENOUS | Status: AC | PRN
  Administered 2010-10-08: 10.9 via INTRAVENOUS

## 2010-10-08 NOTE — Assessment & Plan Note (Signed)
The patient has had difficulties with Crestor, Zocor, Vytorin.  He does tolerate Pravachol.  I have left him on Niaspan because he tolerates it.  It does help with his triglycerides.

## 2010-10-08 NOTE — Progress Notes (Signed)
HPI   Patient is seen today as an add-on from the nuclear exercise lab.  Patient has been having exertional shortness of breath and tightness.  Medications have been tried and have not been effective.  A stress test was arranged for today.  He has not been having any rest symptoms.  He is limited however by his exercise chest tightness and shortness of breath.  When he began to walk on the treadmill today he had significant chest tightness with very little exercise.  His EKGs became distinctly abnormal with greater than 1-2 mm of diffuse ST depression.  The study was changed to Reynolds.  This study shows a large defect in the inferior and inferolateral wall.  It redistributes partially.  However there is normal wall motion.  I wonder if this represents severe ischemia with incomplete reversibility.  There is also a defect in the anterior wall.  It is most prominent in the mid ventricle.  There is complete reversibility in this area. Allergies  Allergen Reactions  . Codeine   . Ezetimibe-Simvastatin   . Rosuvastatin   . Simvastatin     Current Outpatient Prescriptions  Medication Sig Dispense Refill  . albuterol (PROVENTIL HFA;VENTOLIN HFA) 108 (90 BASE) MCG/ACT inhaler Inhale 2 puffs into the lungs every 6 (six) hours as needed for wheezing.  1 Inhaler  5  . aspirin (ASPIRIN EC) 81 MG EC tablet Take 81 mg by mouth daily.        . Cholecalciferol (VITAMIN D3) 2000 UNITS TABS Take by mouth daily.        . Fluticasone-Salmeterol (ADVAIR DISKUS) 250-50 MCG/DOSE AEPB Inhale 1 puff into the lungs 2 (two) times daily.  1 each  11  . loratadine-pseudoephedrine (CLARITIN-D 24-HOUR) 10-240 MG per 24 hr tablet Take 1 tablet by mouth daily. As needed for allergies        . metoprolol tartrate (LOPRESSOR) 25 MG tablet Take 12.5 mg by mouth 2 (two) times daily.        . montelukast (SINGULAIR) 10 MG tablet Take 1 tablet (10 mg total) by mouth at bedtime.  90 tablet  3  . NEXIUM 40 MG capsule TAKE 1 CAPSULE  EVERY DAY  30 capsule  1  . niacin (NIASPAN) 1000 MG CR tablet Take 1,000 mg by mouth daily.        . pravastatin (PRAVACHOL) 40 MG tablet TAKE 1 TABLET EVERY DAY  30 tablet  9  . Tamsulosin HCl (FLOMAX) 0.4 MG CAPS TAKE 1 CAPSULE BY MOUTH EVERY DAY  30 capsule  0   No current facility-administered medications for this visit.   Facility-Administered Medications Ordered in Other Visits  Medication Dose Route Frequency Provider Last Rate Last Dose  . technetium tetrofosmin (TC-MYOVIEW) injection 10.9 milli Curie  10.9 milli Curie Intravenous Once PRN Cassell Clement, MD   10.9 milli Curie at 10/08/10 1010  . technetium tetrofosmin (TC-MYOVIEW) injection 33 milli Curie  33 milli Curie Intravenous Once PRN Cassell Clement, MD   33 milli Curie at 10/08/10 1155    History   Social History  . Marital Status: Married    Spouse Name: N/A    Number of Children: 3  . Years of Education: N/A   Occupational History  . retired-real estate    Social History Main Topics  . Smoking status: Never Smoker   . Smokeless tobacco: Not on file  . Alcohol Use: Yes  . Drug Use: No  . Sexually Active: Not on file  Other Topics Concern  . Not on file   Social History Narrative   Pt has 51yo son-paranoid schizophrenic, unempolyed, lives with him.    Family History  Problem Relation Age of Onset  . Heart disease Father     mother  . Aneurysm    . Hyperlipidemia    . Liver cancer      grandfather  . Diabetes Sister     mother    Past Medical History  Diagnosis Date  . CAD (coronary artery disease)     Taxus stent to the native LAD after the LIMA insertion ( procedure done through the LIMA)  April, 2007  . Hyperlipidemia     intolerance to Crestor Zocor Vytorin. He tolerates Pravachol...low HDL  . GERD (gastroesophageal reflux disease)   . Rosacea   . Prostatitis   . Depression   . Benign prostatic hypertrophy   . Asthma   . Paresthesias     successful surgery by Dr. Julio Sicks  .  Arthritis   . Diabetes mellitus     type II-diet  . Diverticulosis   . Hx of CABG     1992  . Ejection fraction     EF 60%, 2007    Past Surgical History  Procedure Date  . C-spine disc surgery 03/2006  . C4-5 surgery 01/2009  . Cardiac catheterization     stent placed  . Coronary artery bypass graft   . Appendectomy   . Tonsillectomy   . Coccyx fracture surgery 1997    ROS  Patient denies fever, chills, headache, sweats, rash, change in vision, change in hearing, cough, nausea vomiting, urinary symptoms.  All other systems are reviewed and are negative.  PHYSICAL EXAM Patient is here with his wife.  He is oriented to person time and place.  Affect is normal.  Head is atraumatic.  There is no jugular venous is tension.  Lungs reveal a few scattered rhonchi.  Cardiac exam reveals S1 and S2.  No clicks or significant murmurs.  The abdomen is protuberant but soft.  There is no significant peripheral edema.  There is no musculoskeletal deformities.  There are no skin rashes. There were no vitals filed for this visit.  EKG EKGs are done with the stress study.  His resting EKG reveals no marked abnormality.  The stress images reveal ST depression with T wave inversion in leads 1,2,3, aVF,I and across the precordium  ASSESSMENT & PLAN

## 2010-10-08 NOTE — Assessment & Plan Note (Signed)
The patient is having significant exertional symptoms.  He has a significantly abnormal exercise test today.  He has had no rest symptoms.  We will arrange for cardiac catheterization tomorrow.  I've chosen to not give him Plavix

## 2010-10-08 NOTE — Progress Notes (Signed)
Nuclear report routed to Dr. Jonny Ruiz. Note- Dr. Myrtis Ser consulted on pt today. Willford Rabideau, Farris Has

## 2010-10-08 NOTE — Patient Instructions (Signed)

## 2010-10-08 NOTE — Progress Notes (Signed)
[facility] SITE 3 NUCLEAR MED [address] [place] [address] [phone]  Cardiology Nuclear Med Study  Sean Lozano is a 77 y.o. male [id number] 08-Dec-1933   Nuclear Med Background Indication for Stress Test:  Evaluation for Ischemia, Graft Patency and Stent Patency History:  '92 CABG x6;'01 Echo:NL,7/06 MPS: ? Mild inferior ischemia, EF=71%;'07 MI> Cath> Stent LAD, patent grafts, EF=60% Cardiac Risk Factors: Family History - CAD, History of Smoking, Hypertension, Lipids and NIDDM  Symptoms:  Chest Pain and Chest Tightness with Exertion (last date of chest discomfort Yesterday), DOE, Fatigue, Fatigue with Exertion and SOB   Nuclear Pre-Procedure Caffeine/Decaff Intake:  None NPO After: 7:00pm   Lungs:  Diminished breath sounds, no wheezing IV 0.9% NS with Angio Cath:  20g  IV Site: R Hand  IV Started by:  Bonnita Levan, RN  Chest Size (in):  44 Cup Size: n/a  Height: 6' (1.829 m)  Weight:  226 lb (102.513 kg)  BMI:  Body mass index is 30.65 kg/(m^2). Tech Comments:  Held metoprolol x 24 hrs    Nuclear Med Study 1 or 2 day study: 1 day  Stress Test Type:  Lexiscan  Reading MD: Cassell Clement, MD  Order Authorizing Provider:  Dr. Oliver Barre  Resting Radionuclide: Technetium 2m Tetrofosmin  Resting Radionuclide Dose: 10.9 mCi   Stress Radionuclide:  Technetium 62m Tetrofosmin  Stress Radionuclide Dose: 33.0 mCi           Stress Protocol Rest HR: 62 Stress HR: 108  Rest BP: 134/83 Stress BP: 103/49  Exercise Time (min): 4:45 METS: 4.6   Predicted Max HR: 145 bpm % Max HR: 74.48 bpm Rate Pressure Product: 21308   Dose of Adenosine (mg):  n/a Dose of Lexiscan: 0.4 mg  Dose of Atropine (mg): n/a Dose of Dobutamine: n/a mcg/kg/min (at max HR)  Stress Test Technologist: Irean Hong, RN  Nuclear Technologist:  Doyne Keel, CNMT     Rest Procedure:  Myocardial perfusion imaging was performed at rest 45 minutes following the intravenous  administration of Technetium 26m Tetrofosmin. Rest ECG: NSR 1st degree AVB, PVC  Stress Procedure:  The patient attempted to walk the treadmill utilizing the Bruce protocol for 3:00. He complained of chest tightness 9/10 with decrease BP 96/57. The treadmill stopped, and changed to sitting lexiscan,.The patient received IV Lexiscan 0.4 mg over 15-seconds.  Technetium 40m Tetrofosmin injected at 30-seconds.  There were marked ST-T changes with Lexiscan, occasional PVC's, rare couplet,PAC.  Quantitative spect images were obtained after a 45 minute delay. Stress ECG: Significant ST abnormalities consistent with ischemia.  QPS Raw Data Images:  Normal; no motion artifact; normal heart/lung ratio. Stress Images:  There is decreased uptake in the anterior wall.and inferior wall Rest Images:  There is decreased uptake in the inferior wall. Subtraction (SDS):  These findings are consistent with ischemia. Transient Ischemic Dilatation (Normal <1.22):  1.12 Lung/Heart Ratio (Normal <0.45):  0.36  Quantitative Gated Spect Images QGS EDV:  81 ml QGS ESV:  26 ml QGS cine images:  NL LV Function; NL Wall Motion QGS EF: 68%  Impression Exercise Capacity:  Lexiscan with no exercise. BP Response:  Hypotensive blood pressure response. Clinical Symptoms:  Significant chest pain. ECG Impression:  Significant ST abnormalities consistent with ischemia. Comparison with Prior Nuclear Study: No images to compare  Overall Impression:  High risk stress nuclear study. Imaging is suggestive of reversible anterior wall ischemia and partially reversible inferior wall ischemia. Significant chest pain and  significant EKG changes.     Cassell Clement

## 2010-10-09 ENCOUNTER — Inpatient Hospital Stay (HOSPITAL_COMMUNITY)
Admission: RE | Admit: 2010-10-09 | Discharge: 2010-10-13 | DRG: 247 | Disposition: A | Discharge status: Home / Self Care | Payer: Medicare Other | Source: Ambulatory Visit | Attending: Cardiology | Admitting: Cardiology

## 2010-10-09 DIAGNOSIS — I2510 Atherosclerotic heart disease of native coronary artery without angina pectoris: Secondary | ICD-10-CM | POA: Diagnosis present

## 2010-10-09 DIAGNOSIS — R0989 Other specified symptoms and signs involving the circulatory and respiratory systems: Secondary | ICD-10-CM | POA: Diagnosis present

## 2010-10-09 DIAGNOSIS — L089 Local infection of the skin and subcutaneous tissue, unspecified: Secondary | ICD-10-CM | POA: Diagnosis present

## 2010-10-09 DIAGNOSIS — N400 Enlarged prostate without lower urinary tract symptoms: Secondary | ICD-10-CM | POA: Diagnosis present

## 2010-10-09 DIAGNOSIS — I25810 Atherosclerosis of coronary artery bypass graft(s) without angina pectoris: Principal | ICD-10-CM | POA: Diagnosis present

## 2010-10-09 DIAGNOSIS — F329 Major depressive disorder, single episode, unspecified: Secondary | ICD-10-CM | POA: Diagnosis present

## 2010-10-09 DIAGNOSIS — Z7982 Long term (current) use of aspirin: Secondary | ICD-10-CM

## 2010-10-09 DIAGNOSIS — J45909 Unspecified asthma, uncomplicated: Secondary | ICD-10-CM | POA: Diagnosis present

## 2010-10-09 DIAGNOSIS — I079 Rheumatic tricuspid valve disease, unspecified: Secondary | ICD-10-CM | POA: Diagnosis present

## 2010-10-09 DIAGNOSIS — E119 Type 2 diabetes mellitus without complications: Secondary | ICD-10-CM | POA: Diagnosis present

## 2010-10-09 DIAGNOSIS — E785 Hyperlipidemia, unspecified: Secondary | ICD-10-CM | POA: Diagnosis present

## 2010-10-09 DIAGNOSIS — L719 Rosacea, unspecified: Secondary | ICD-10-CM | POA: Diagnosis present

## 2010-10-09 DIAGNOSIS — F3289 Other specified depressive episodes: Secondary | ICD-10-CM | POA: Diagnosis present

## 2010-10-09 DIAGNOSIS — S80869A Insect bite (nonvenomous), unspecified lower leg, initial encounter: Secondary | ICD-10-CM | POA: Diagnosis present

## 2010-10-09 DIAGNOSIS — I200 Unstable angina: Secondary | ICD-10-CM | POA: Diagnosis present

## 2010-10-09 DIAGNOSIS — R0609 Other forms of dyspnea: Secondary | ICD-10-CM | POA: Diagnosis present

## 2010-10-09 DIAGNOSIS — M129 Arthropathy, unspecified: Secondary | ICD-10-CM | POA: Diagnosis present

## 2010-10-09 LAB — POCT ACTIVATED CLOTTING TIME: Activated Clotting Time: 160 seconds

## 2010-10-09 LAB — CBC
HCT: 43.2 % (ref 39.0–52.0)
Hemoglobin: 15.5 g/dL (ref 13.0–17.0)
MCH: 33.8 pg (ref 26.0–34.0)
MCHC: 35.9 g/dL (ref 30.0–36.0)
MCV: 94.1 fL (ref 78.0–100.0)
Platelets: 167 10*3/uL (ref 150–400)
RBC: 4.59 MIL/uL (ref 4.22–5.81)
RDW: 13.1 % (ref 11.5–15.5)
WBC: 11 10*3/uL — ABNORMAL HIGH (ref 4.0–10.5)

## 2010-10-09 LAB — BASIC METABOLIC PANEL
BUN: 14 mg/dL (ref 6–23)
CO2: 27 mEq/L (ref 19–32)
Calcium: 9.1 mg/dL (ref 8.4–10.5)
Chloride: 101 mEq/L (ref 96–112)
Creatinine, Ser: 1.04 mg/dL (ref 0.50–1.35)
GFR calc Af Amer: >60 mL/min (ref >60)
GFR calc non Af Amer: >60 mL/min (ref >60)
Glucose, Bld: 135 mg/dL — ABNORMAL HIGH (ref 70–99)
Potassium: 4.3 mEq/L (ref 3.5–5.1)
Sodium: 136 mEq/L (ref 135–145)

## 2010-10-09 LAB — PROTIME-INR
INR: 1.08 (ref 0.00–1.49)
Prothrombin Time: 14.2 seconds (ref 11.6–15.2)

## 2010-10-09 LAB — GLUCOSE, CAPILLARY: Glucose-Capillary: 128 mg/dL — ABNORMAL HIGH (ref 70–99)

## 2010-10-10 LAB — CBC
HCT: 44.7 % (ref 39.0–52.0)
Hemoglobin: 15.7 g/dL (ref 13.0–17.0)
MCH: 33.5 pg (ref 26.0–34.0)
MCHC: 35.1 g/dL (ref 30.0–36.0)
MCV: 95.3 fL (ref 78.0–100.0)
Platelets: 167 10*3/uL (ref 150–400)
RBC: 4.69 MIL/uL (ref 4.22–5.81)
RDW: 13.2 % (ref 11.5–15.5)
WBC: 11.6 10*3/uL — ABNORMAL HIGH (ref 4.0–10.5)

## 2010-10-10 LAB — BASIC METABOLIC PANEL
BUN: 15 mg/dL (ref 6–23)
CO2: 24 mEq/L (ref 19–32)
Calcium: 8.9 mg/dL (ref 8.4–10.5)
Chloride: 104 mEq/L (ref 96–112)
Creatinine, Ser: 0.99 mg/dL (ref 0.50–1.35)
GFR calc Af Amer: >60 mL/min (ref >60)
GFR calc non Af Amer: >60 mL/min (ref >60)
Glucose, Bld: 136 mg/dL — ABNORMAL HIGH (ref 70–99)
Potassium: 4 mEq/L (ref 3.5–5.1)
Sodium: 137 mEq/L (ref 135–145)

## 2010-10-11 HISTORY — PX: CARDIAC CATHETERIZATION: SHX172

## 2010-10-11 LAB — BASIC METABOLIC PANEL
BUN: 18 mg/dL (ref 6–23)
CO2: 26 mEq/L (ref 19–32)
Calcium: 9 mg/dL (ref 8.4–10.5)
Chloride: 102 mEq/L (ref 96–112)
Creatinine, Ser: 1.12 mg/dL (ref 0.50–1.35)
GFR calc Af Amer: >60 mL/min (ref >60)
GFR calc non Af Amer: >60 mL/min (ref >60)
Glucose, Bld: 129 mg/dL — ABNORMAL HIGH (ref 70–99)
Potassium: 3.8 mEq/L (ref 3.5–5.1)
Sodium: 136 mEq/L (ref 135–145)

## 2010-10-11 LAB — CBC
HCT: 43.4 % (ref 39.0–52.0)
Hemoglobin: 15.2 g/dL (ref 13.0–17.0)
MCH: 33.3 pg (ref 26.0–34.0)
MCHC: 35 g/dL (ref 30.0–36.0)
MCV: 95 fL (ref 78.0–100.0)
Platelets: 170 10*3/uL (ref 150–400)
RBC: 4.57 MIL/uL (ref 4.22–5.81)
RDW: 13.1 % (ref 11.5–15.5)
WBC: 12.4 10*3/uL — ABNORMAL HIGH (ref 4.0–10.5)

## 2010-10-12 DIAGNOSIS — I25810 Atherosclerosis of coronary artery bypass graft(s) without angina pectoris: Secondary | ICD-10-CM

## 2010-10-12 LAB — CBC
HCT: 42.8 % (ref 39.0–52.0)
Hemoglobin: 15.3 g/dL (ref 13.0–17.0)
MCH: 33.8 pg (ref 26.0–34.0)
MCHC: 35.7 g/dL (ref 30.0–36.0)
MCV: 94.5 fL (ref 78.0–100.0)
Platelets: 172 10*3/uL (ref 150–400)
RBC: 4.53 MIL/uL (ref 4.22–5.81)
RDW: 13 % (ref 11.5–15.5)
WBC: 12.2 10*3/uL — ABNORMAL HIGH (ref 4.0–10.5)

## 2010-10-12 LAB — BASIC METABOLIC PANEL
BUN: 18 mg/dL (ref 6–23)
CO2: 25 mEq/L (ref 19–32)
Calcium: 8.8 mg/dL (ref 8.4–10.5)
Chloride: 102 mEq/L (ref 96–112)
Creatinine, Ser: 1.04 mg/dL (ref 0.50–1.35)
GFR calc Af Amer: >60 mL/min (ref >60)
GFR calc non Af Amer: >60 mL/min (ref >60)
Glucose, Bld: 130 mg/dL — ABNORMAL HIGH (ref 70–99)
Potassium: 3.7 mEq/L (ref 3.5–5.1)
Sodium: 137 mEq/L (ref 135–145)

## 2010-10-12 LAB — WOUND CULTURE
Culture: NO GROWTH
Gram Stain: NONE SEEN

## 2010-10-12 LAB — GLUCOSE, CAPILLARY
Glucose-Capillary: 126 mg/dL — ABNORMAL HIGH (ref 70–99)
Glucose-Capillary: 130 mg/dL — ABNORMAL HIGH (ref 70–99)

## 2010-10-12 LAB — MRSA PCR SCREENING: MRSA by PCR: NEGATIVE

## 2010-10-12 LAB — PROTIME-INR
INR: 1.1 (ref 0.00–1.49)
Prothrombin Time: 14.4 seconds (ref 11.6–15.2)

## 2010-10-12 LAB — POCT ACTIVATED CLOTTING TIME: Activated Clotting Time: 446 seconds

## 2010-10-13 ENCOUNTER — Inpatient Hospital Stay (HOSPITAL_COMMUNITY): Payer: Medicare Other

## 2010-10-13 LAB — BASIC METABOLIC PANEL
BUN: 15 mg/dL (ref 6–23)
CO2: 27 mEq/L (ref 19–32)
Calcium: 9 mg/dL (ref 8.4–10.5)
Chloride: 103 mEq/L (ref 96–112)
Creatinine, Ser: 1 mg/dL (ref 0.50–1.35)
GFR calc Af Amer: >60 mL/min (ref >60)
GFR calc non Af Amer: >60 mL/min (ref >60)
Glucose, Bld: 100 mg/dL — ABNORMAL HIGH (ref 70–99)
Potassium: 4.3 mEq/L (ref 3.5–5.1)
Sodium: 138 mEq/L (ref 135–145)

## 2010-10-13 LAB — CBC
HCT: 43.8 % (ref 39.0–52.0)
Hemoglobin: 15.6 g/dL (ref 13.0–17.0)
MCH: 33.8 pg (ref 26.0–34.0)
MCHC: 35.6 g/dL (ref 30.0–36.0)
MCV: 94.8 fL (ref 78.0–100.0)
Platelets: 177 10*3/uL (ref 150–400)
RBC: 4.62 MIL/uL (ref 4.22–5.81)
RDW: 13 % (ref 11.5–15.5)
WBC: 13.1 10*3/uL — ABNORMAL HIGH (ref 4.0–10.5)

## 2010-10-15 NOTE — Discharge Summary (Addendum)
NAMEELMOR, KOST NO.:  [id number]  MEDICAL RECORD NO.:  [id number]  LOCATION:  2924                         FACILITY:  [facility]  PHYSICIAN:  Sean Fells. Excell Seltzer, MD  DATE OF BIRTH:  16-Jan-1934  DATE OF ADMISSION:  10/09/2010 DATE OF DISCHARGE:  10/13/2010                              DISCHARGE SUMMARY   DISCHARGE DIAGNOSES: 1. Abnormal nuclear stress test as an outpatient prior to admission. 2. Coronary artery disease.     a.     Recurrent three-vessel native disease and graft disease,      self photographed for CABG, ultimately status post Promus drug-      eluting stent with complex PCI of the SVG to the OM1 and OM2, October 12, 2010.     b.     For staged PCI with focal mid LAD stenosis after the LIMA in      several weeks.     c.     Status post CABG x6 in 1992.     d.     Status post Taxus drug-eluting stent to the native LAD after      LIMA insertion, April 2007. 3. Diet-controlled diabetes mellitus. 4. Benign prostatic hyperplasia. 5. Dyslipidemia, intolerant to Crestor, Zocor, and Vytorin, but     tolerates Pravachol. 6. Asthma. 7. Arthritis. 8. Depression. 9. Rosacea. 10.Diverticulosis.  SURGICAL HISTORY:  C-spine for degenerative arthritis of the spine, appendectomy, tonsillectomy, CABG x6.  HOSPITAL COURSE:  Sean Lozano is an 77 year old gentleman with a history of known coronary artery disease, who presented to Dr. Henrietta Lozano office with complaints of exertional chest pain.  Nuclear stress testing was obtained, and while in the treadmill, the patient developed significant chest tightness with very little exercise.  The EKGs became distinctly abnormal greater than 1-2 mm ST depression diffusely.  Study was changed to [facility] and ultimately showed a large defect in the inferior and inferolateral wall, which redistributed partially.  There was normal wall motion.  Dr. Myrtis Lozano wondered if this represented severe ischemia with incomplete  reversibility.  There was also defect in the anterior wall. There was most prominent in the mid ventricle.  There was complete reversibility with this area.  Because of the symptoms, Dr. Myrtis Lozano felt that this warranted cardiac catheterization.  He was brought into the hospital and underwent a diagnostic catheterization on October 09, 2010 by Dr. Antoine Lozano, for that the patient is pending at this time, but ultimately showed severe three-vessel disease with severe graft disease. He was seen in consultation by [facility] and Dr. Donata Lozano recommended an attempted PCI due to the poor quality of target were redo operation. The patient was started on Plavix with a moderate load.  Of note, there was also mentioned that he had a spider bite to the medial left aspect of his leg.  He was continued on his Keflex and mupirocin which had been prescribed as an outpatient.  Wound culture remained negative.  The patient was brought to the cath lab for PCI October 12, 2010, and Dr. Excell Lozano ultimately performed successful complex PCI of the SVG to the OM1 andOM2 branches.  There was severe residual mid  LAD stenosis beyond the graft insertion site, for which Dr. Excell Lozano recommended staged PCI. Unfortunately, Dr. Myrtis Lozano does not back again in the office until the very end of July, so the patient would temporarily follow up with Dr. Excell Lozano as an outpatient.  Dr. Excell Lozano seen and examined the patient today and feels he is stable for discharge.  Of note, the patient ambulated with cardiac rehab without complaints of chest pain.  He was monitored throughout the walk-ins.  O2 sats did decrease to 895-90% on room air and the patient suspected this was from his asthma.  He had no complaints of shortness of breath or dyspnea on exertion.  Per discussion with Dr. Excell Lozano, a portable chest x-ray would be checked prior to discharge.  If it merely shows reflection of his chronic lung disease rather than superimposed process, he will  be discharged home.  DISCHARGE LABS:  WBC 13.1, hemoglobin 15.6, hematocrit 43.8, platelet count 157.  Sodium 138, potassium 4.3, chloride 103, CO2 27, glucose 100, BUN 15, creatinine 1.0.  STUDIES: 1. Cardiac catheterization October 09, 2010, full dictation is pending. 2. Cardiac catheterization October 12, 2010, please see full report for     details.  DISCHARGE MEDICATIONS: 1. Plavix 75 mg daily. 2. Nitro sublingual 0.4 mg every 5 minutes as needed up to three doses     for chest pain. 3. Protonix 40 mg daily. 4. Advair Diskus 250/50 one puff inhaled b.i.d. 5. Albuterol inhaler 2 puffs inhaled q.6 h. p.r.n. wheezing. 6. Aspirin 81 mg daily. 7. Keflex 500 mg p.o. b.i.d.  He is to follow up with his PCP to     determine how long he should continue this medicine as an     outpatient. 8. Vitamin D3 2000 units daily. 9. Metoprolol tartrate 25 mg half tablet b.i.d. 10.Mupirocin topical 2% cream one application topically daily as     needed for infection. 11.Niaspan 1000 mg daily. 12.Pravastatin 40 mg daily. 13.Singulair 10 mg daily at bedtime. 14.Tamsulosin 0.4 mg daily.  Please note the patient's Nexium was changed to Protonix at this admission secondary to the addition of Plavix.  DISPOSITION:  Sean Lozano will be discharged in stable condition to home, pending stability of chest x-ray.  He is not to lift anything over 5 pounds for one week, not to drive for 2 weeks, or participate in sexual activity for one week.  He is generally to participate in low grade activity until seen back by his cardiologist.  He is to follow a low- sodium heart-healthy diet.  To call or return if he notices any pain, swelling, bleeding or pus at his cath site.  He initially scheduled to follow up with Dr. Myrtis Lozano as an outpatient, but Dr. Myrtis Lozano will be out of town until July 31 and the patient should be seen sooner than that and therefore was scheduled to follow up with Dr. Excell Lozano October 29, 2010 at 3:30  p.m.  DURATION OF DISCHARGE ENCOUNTER:  Greater than 30 minutes including physician and PA time.     Sean Lozano, P.A.C.   ______________________________ Sean Fells. Excell Seltzer, MD    DD/MEDQ  D:  10/13/2010  T:  10/14/2010  Job:  [id number]  cc:   Corwin Levins, MD Luis Abed, MD, [facility]  Electronically Signed by Sean Lozano  on 10/15/2010 01:48:26 PM Electronically Signed by Tonny Bollman MD on 10/29/2010 12:26:34 AM

## 2010-10-22 ENCOUNTER — Encounter: Payer: Self-pay | Admitting: Cardiovascular Disease

## 2010-10-22 NOTE — Consult Note (Signed)
NAMEMarland Lozano  DESHAUN, WEISINGER NO.:  [id number]  MEDICAL RECORD NO.:  [id number]  LOCATION:  2003                         FACILITY:  [facility]  PHYSICIAN:  Kerin Perna, M.D.  DATE OF BIRTH:  Nov 02, 1933  DATE OF CONSULTATION:  10/09/2010 DATE OF DISCHARGE:                                CONSULTATION   PHYSICIAN REQUEST CONSULTATION:  Rollene Rotunda, MD, [facility]  PRIMARY CARE PHYSICIAN:  Corwin Levins, MD  REASON FOR CONSULTATION:  Class III exertional angina with recurrent coronary artery disease, status post CABG x6 in 1992.  HISTORY OF PRESENT ILLNESS:  I was asked to evaluate this 77 year old Caucasian male diabetic for possible redo coronary artery bypass grafting.  He has had persistent exertional chest pain over the past 4-6 weeks.  A stress test was performed, which was positive with ST-segment changes and he was brought in for a diagnostic cath.  This was performed by Dr. Antoine Poche and demonstrated severe native coronary artery disease with his native coronary circulation diffusely occluded.  His previous bypass grafts from a CABG x6 in 1992 by Dr. Andrey Campanile showed occluded vein graft to the diagonal, occluded vein graft to the right coronary artery, a patent left IMA to the proximal LAD with distal diffuse LAD disease and a patent vein graft to the first, second, and third circumflex marginals with high-grade stenosis involved in the main vein graft body especially proximally and really the only good distal target and the circumflex system was the distal posterolateral.  His overall LVEF was fairly well preserved and LVEDP was 10.  Because of his symptoms and coronary anatomy, a surgical evaluation was requested.  PAST MEDICAL HISTORY: 1. Diabetes mellitus. 2. BPH. 3. Dyslipidemia. 4. Status post stent to the LAD distal to hold LIMA in 2007 with in-     stent restenosis. 5. Asthma. 6. Arthritis. 7. Depression.  SURGICAL HISTORY:  C-spine surgery for  degenerative arthritis twice in 2007 and 2010, appendectomy, tonsillectomy, CABG x6 by Dr. Andrey Campanile in 1992.  MEDICATIONS: 1. Proventil and Advair for his asthma. 2. Aspirin 81 mg daily. 3. Lopressor 25 mg b.i.d. 4. Nexium 40 mg a day. 5. Singulair 10 mg tablet daily. 6. Pravachol 40 mg daily. 7. Flomax 0.4 mg daily.  ALLERGIES:  CODEINE and STATINS.  FAMILY HISTORY:  Positive for diabetes and dyslipidemia.  SOCIAL HISTORY:  He is retired.  Does not smoke and use alcohol occasionally.  REVIEW OF SYSTEMS:  CONSTITUTIONAL:  Review is negative for fever or weight loss.  HEENT:  Review is negative for change in vision or difficulty swallowing or dental issues.  THORACIC:  Review is negative for history of thoracic trauma or recent symptoms of upper respiratory infection, positive for asthma, well controlled.  CARDIAC:  Review is positive coronary artery disease and negative for history of valvular disease or arrhythmia.  GI:  Review is negative for hepatitis, jaundice, or blood per rectum.  NEUROLOGIC:  Review is positive for BPH. ENDOCRINE:  Review is positive for diabetes and negative for thyroid disease.  VASCULAR:  Review is negative for DVT, claudication, or TIA. Carotid Doppler studies are pending.  The saphenous vein was harvested from both lower  extremities from the ankle to the knee for his previous bypass surgery.  PHYSICAL EXAMINATION:  GENERAL APPEARANCE:  That of a pleasant, well- developed Caucasian male who at age 19 and still be in fairly good health generally. HEENT:  Normocephalic. NECK:  Without JVD, mass, or bruit. LYMPHATICS:  No palpable cervical adenopathy.  Breath sounds are clear. He has well-healed sternal incision without sternal deformity. CARDIAC:  Regular rhythm without S3 gallop or murmur. ABDOMEN:  Soft without pulsatile mass. EXTREMITIES:  Reveal no clubbing, cyanosis, or edema.  Peripheral pulses are 2+.  There is no venous insufficiency of  lower extremities; however, he does have venectomy scars from the ankle to the knee bilaterally. NEUROLOGIC:  Alert and oriented without focal motor deficit.  LABORATORY DATA:  I reviewed the recent coronary arteriograms and he has severe native vessel disease.  His only myocardial bloods comes from his bypass grafts.  There is a high-grade LAD stenosis distal to the LIMA graft probably the site of previous stent.  There is also diffuse disease distal to this 90% stenosis.  The circumflex vein graft has severe disease, but is still patent.  The only graftable vessel off the circumflex system, there is be the distal posterolateral.  LV function is well preserved.  RECOMMENDATIONS:  I recommend an attempted percutaneous intervention due to the poor quality of targets for a redo operation this 77 year old gentleman.  If percutaneous intervention is not successful, then discussion of the high risk redo surgical revascularization would be indicated at that time.     Kerin Perna, M.D.     PV/MEDQ  D:  10/10/2010  T:  10/10/2010  Job:  [id number]  Electronically Signed by Kerin Perna M.D. on 10/22/2010 02:48:47 PM

## 2010-10-29 ENCOUNTER — Ambulatory Visit (INDEPENDENT_AMBULATORY_CARE_PROVIDER_SITE_OTHER): Payer: Medicare Other | Admitting: Cardiovascular Disease

## 2010-10-29 ENCOUNTER — Encounter: Payer: Self-pay | Admitting: Cardiovascular Disease

## 2010-10-29 ENCOUNTER — Other Ambulatory Visit: Payer: Self-pay | Admitting: Internal Medicine

## 2010-10-29 ENCOUNTER — Encounter: Payer: Self-pay | Admitting: *Deleted

## 2010-10-29 VITALS — BP 99/68 | HR 59 | Ht 71.5 in | Wt 219.0 lb

## 2010-10-29 DIAGNOSIS — E785 Hyperlipidemia, unspecified: Secondary | ICD-10-CM

## 2010-10-29 DIAGNOSIS — I25810 Atherosclerosis of coronary artery bypass graft(s) without angina pectoris: Secondary | ICD-10-CM

## 2010-10-29 DIAGNOSIS — I2510 Atherosclerotic heart disease of native coronary artery without angina pectoris: Secondary | ICD-10-CM

## 2010-10-29 NOTE — Patient Instructions (Signed)
Your procedure is scheduled for November 02, 2010 at 9:30 at [facility].  See the instruction sheet you were given today for detail.

## 2010-10-29 NOTE — Progress Notes (Signed)
HPI:  Mr. Hoog is a 77 year old gentleman returning for follow up evaluation. The patient was hospitalized June 29 through October 13, 2010. He had a markedly abnormal stress Myoview test after presenting with exertional angina. This demonstrated inferolateral ischemia at low level exercise the next day and to be converted to [place] because of marked ST segment depression at a very low level of exercise.  The patient underwent cardiac catheter demonstrating severe native coronary and bypass graft disease. He was evaluated by cardiac surgery for redo coronary bypass and he was not felt to be a good candidate for this based mainly on anatomic considerations. He ultimately underwent PCI of the saphenous vein graft to first and second obtuse marginals with a good result.  Drug-eluting stent platforms were chosen.  The patient was noted to have severe residual stenosis beyond the LIMA anastomosis in the mid LAD. There is a stent that was placed in about 2007 and he has 95% stenosis at the distal edge of the stent in the native LAD. We had planned on staged intervention of this lesion.  The patient has had an excellent symptomatic response to revascularization. He reports mild angina when he gets up to 2.5 miles per hour on the treadmill. This is a marked improvement compared to the time before PCI. He denies shortness of breath or edema. He has no other complaints. He is tolerating Plavix without bleeding problems.  Outpatient Encounter Prescriptions as of 10/29/2010  Medication Sig Dispense Refill  . albuterol (PROVENTIL HFA;VENTOLIN HFA) 108 (90 BASE) MCG/ACT inhaler Inhale 2 puffs into the lungs every 6 (six) hours as needed for wheezing.  1 Inhaler  5  . aspirin (ASPIRIN EC) 81 MG EC tablet Take 81 mg by mouth daily.        . Cholecalciferol (VITAMIN D3) 2000 UNITS TABS Take 1 tablet by mouth daily.       . clopidogrel (PLAVIX) 75 MG tablet Take 1 tablet by mouth Daily.        . Fluticasone-Salmeterol  (ADVAIR DISKUS) 250-50 MCG/DOSE AEPB Inhale 1 puff into the lungs 2 (two) times daily.  1 each  11  . metoprolol tartrate (LOPRESSOR) 25 MG tablet 1/2 tab po bid      . montelukast (SINGULAIR) 10 MG tablet Take 1 tablet (10 mg total) by mouth at bedtime.  90 tablet  3  . mupirocin (BACTROBAN) 2 % cream as needed.        . niacin (NIASPAN) 1000 MG CR tablet Take 1,000 mg by mouth daily.        Marland Kitchen NITROSTAT 0.4 MG SL tablet PLACE 1 TABLET WITH THE UNSET OF CHEST PAIN UNDER TONGUE EVERY 5 MINUTES UP TO 3 DOSES. IF PAIN STILL PERSIST CALL EMERGENCY PERSONAL.      Marland Kitchen pantoprazole (PROTONIX) 40 MG tablet Take 1 tablet by mouth Daily.      . pravastatin (PRAVACHOL) 40 MG tablet TAKE 1 TABLET EVERY DAY  30 tablet  9  . Tamsulosin HCl (FLOMAX) 0.4 MG CAPS TAKE 1 CAPSULE BY MOUTH EVERY DAY  30 capsule  0  . DISCONTD: Cephalexin (KEFLEX PO) 1 tab po bid       . DISCONTD: loratadine-pseudoephedrine (CLARITIN-D 24-HOUR) 10-240 MG per 24 hr tablet Take 1 tablet by mouth daily. As needed for allergies        . DISCONTD: NEXIUM 40 MG capsule TAKE 1 CAPSULE EVERY DAY  30 capsule  1    Allergies  Allergen Reactions  . Atenolol   .  Codeine   . Ezetimibe-Simvastatin   . Rosuvastatin   . Simvastatin     Past Medical History  Diagnosis Date  . CAD (coronary artery disease)     Taxus stent to the native LAD after the LIMA insertion ( procedure done through the LIMA)  April, 2007  . Hyperlipidemia     intolerance to Crestor Zocor Vytorin. He tolerates Pravachol...low HDL  . GERD (gastroesophageal reflux disease)   . Rosacea   . Prostatitis   . Depression   . Benign prostatic hypertrophy   . Asthma   . Paresthesias     successful surgery by Dr. Julio Sicks  . Arthritis   . Diabetes mellitus     type II-diet  . Diverticulosis   . Hx of CABG     1992  . Ejection fraction     EF 60%, 2007    ROS: Negative except as per HPI  BP 99/68  Pulse 59  Ht 5' 11.5" (1.816 m)  Wt 219 lb (99.338 kg)  BMI  30.12 kg/m2  PHYSICAL EXAM: Pt is alert and oriented, elderly male in NAD HEENT: normal Neck: JVP - normal, carotids 2+= without bruits Lungs: CTA bilaterally CV: RRR without murmur or gallop Abd: soft, NT, Positive BS, obese Ext: no C/C/E, distal pulses intact and equal Skin: warm/dry no rash  EKG: sinus bradycardia with first degree AV block, T wave abnormality consider inferior ischemia.  ASSESSMENT AND PLAN:

## 2010-10-29 NOTE — Assessment & Plan Note (Signed)
The patient is on statin therapy. His most recent LDL is 79. His HDL is low. He will continue current therapy. He is also noted to be on Niaspan.

## 2010-10-29 NOTE — Cardiovascular Report (Signed)
NAMEKORVER, GRAYBEAL NO.:  [id number]  MEDICAL RECORD NO.:  [id number]  LOCATION:  2924                         FACILITY:  [facility]  PHYSICIAN:  Veverly Fells. Excell Seltzer, MD  DATE OF BIRTH:  08-03-1933  DATE OF PROCEDURE:  10/12/2010 DATE OF DISCHARGE:                           CARDIAC CATHETERIZATION   PROCEDURES: 1. PTCA and stenting of the saphenous vein graft, sequenced to the     first and second obtuse marginals. 2. Selective angiography of the LIMA to LAD.  PROCEDURAL INDICATIONS:  [name] is a 77 year old gentleman, well- known to our practice, who has been followed by Dr. Myrtis Ser for many years. The patient presented with unstable angina and underwent diagnostic catheterization last week by Dr. Antoine Poche.  This demonstrated extensive degenerated saphenous vein graft disease and the vein graft to the sequence to the obtuse marginal branches.  He also had severe mid-LAD stenosis beyond the mammary insertion site.  He was evaluated for redo CABG and was not felt to be a good candidate based on poor targets.  We decided to proceed with PCI.  Risks and indications of procedure were reviewed with the patient. Informed consent was obtained.  The right groin was prepped and draped and anesthetized with 1% lidocaine.  Using modified Seldinger technique, a 6-French sheath was placed in the right femoral artery.  Bivalirudin was used for anticoagulation.  A 6-French AL-1 guide catheter was used for the interventional procedure.  Initial imaging of the vein graft to obtuse marginal branches confirmed 3 separate high-grade lesions.  There is a 90% aortoostial lesion, a 70% mid body lesion, and a 90% eccentric distal anastomotic lesion with evidence of thrombus.  The patient had been adequately preloaded with Plavix.  A filter wire was prepped using normal technique and was deployed beyond the first 2 lesions.  The third lesion was at the coronary anastomosis and so it  was not able to be protected by a filter wire.  A 2.5 x 15 mm balloon was advanced and was inflated to 10 atmospheres over the proximal and mid lesions.  The mid lesion was stented with a 3.5 x 16 mm Promus Element drug-eluting stent.  The stent was deployed at 14 atmospheres.  The aortoostial lesion was also treated with a 3.5 x 16 mm Promus Element stent and it was deployed at 16 atmospheres.  There was a good result with those stents, both appeared well expanded.  The filter wire was retrieved using the retrieval sheath.  The distal lesion was then rewired with a Cougar guidewire.  There was a mild slow flow after the initial stents and the patient had chest tightness.  He was treated with intracoronary verapamil.  The distal lesion was dilated with a 2.5 x 15 mm Emerge balloon and was then stented with a 3.0 x 16 mm Promus Element stent.  The stent was deployed at 12 atmospheres and it was carefully positioned to cover the entirety of the diseased segment.  The stent was oversized a little bit for the distal OM target, but there was a size mismatch between the vein graft to the native coronary.  After this stent was deployed, the patient had  no reflow into the second OM branch.  The flow into the more proximal obtuse marginal was normal.  The patient had 7/10 chest pressure.  He again was treated with intracoronary verapamil.  He did not have any marked ST changes. Of note, he did have hypotension during the procedure and developed bradycardia and nausea.  He seemed to have a vagal reaction.  He was treated with fluids, dopamine, and 0.5 mg of atropine.  His pressure responded quickly.  At that point, I thought the vein graft result was optimal and attention was turned to the LIMA to LAD.  A 6-French LIMA guide catheter was inserted and angiography confirmed a severe focal stenosis beyond the stent in the mid-LAD after the LIMA anastomosis. This does appear approachable by PCI.  I  did not treat this area today as the patient continued to have chest discomfort related to his saphenous vein graft intervention and I thought it would be best to stabilize him and perform staged PCI once he is chest pain free.  A Perclose device was then used for femoral hemostasis.  ASSESSMENT: 1. Successful complex percutaneous intervention of the saphenous vein     graft to obtuse marginal 1 and obtuse marginal 2 branches.  No     reflow phenomenon is present with TIMI-3 flow into the first limb     of the sequence graft and TIMI-2 flow into the second limb 2. Severe residual mid left anterior descending coronary artery     stenosis beyond the graft insertion site.  RECOMMENDATIONS:  The patient will be continued on IV nitroglycerin and low-dose dopamine.  We will monitor him in the TCU overnight and consider staged PCI of the LAD once his chest pain resolves and his hemodynamics are stable.     Veverly Fells. Excell Seltzer, MD     MDC/MEDQ  D:  10/12/2010  T:  10/13/2010  Job:  [id number]  cc:   Luis Abed, MD, [facility]  Electronically Signed by Tonny Bollman MD on 10/29/2010 12:26:42 AM

## 2010-10-29 NOTE — Assessment & Plan Note (Signed)
The patient has had a good response to his vein graft intervention. His anginal threshold is improved. His cardiac catheter notes and films were reviewed and I recommended staged PCI to the native LAD beyond the LIMA anastomosis. This was reviewed with the patient and his wife in detail. They understand the risks, indications, and alternatives to the procedure. They agree to proceed. This will be scheduled next week. The patient should otherwise continue on his current medical program.

## 2010-10-30 LAB — BASIC METABOLIC PANEL
BUN: 18 mg/dL (ref 6–23)
CO2: 27 mEq/L (ref 19–32)
Calcium: 9.1 mg/dL (ref 8.4–10.5)
Chloride: 105 mEq/L (ref 96–112)
Creatinine, Ser: 1.2 mg/dL (ref 0.4–1.5)
GFR: 61.47 mL/min (ref >60.00)
Glucose, Bld: 139 mg/dL — ABNORMAL HIGH (ref 70–99)
Potassium: 4.3 mEq/L (ref 3.5–5.1)
Sodium: 139 mEq/L (ref 135–145)

## 2010-10-30 LAB — CBC WITH DIFFERENTIAL/PLATELET
Basophils Absolute: 0.1 10*3/uL (ref 0.0–0.1)
Basophils Relative: 0.8 % (ref 0.0–3.0)
Eosinophils Absolute: 0.3 10*3/uL (ref 0.0–0.7)
Eosinophils Relative: 2.6 % (ref 0.0–5.0)
HCT: 45.3 % (ref 39.0–52.0)
Hemoglobin: 15.3 g/dL (ref 13.0–17.0)
Lymphocytes Relative: 31.5 % (ref 12.0–46.0)
Lymphs Abs: 3.2 10*3/uL (ref 0.7–4.0)
MCHC: 33.8 g/dL (ref 30.0–36.0)
MCV: 100.6 fl — ABNORMAL HIGH (ref 78.0–100.0)
Monocytes Absolute: 0.9 10*3/uL (ref 0.1–1.0)
Monocytes Relative: 8.6 % (ref 3.0–12.0)
Neutro Abs: 5.7 10*3/uL (ref 1.4–7.7)
Neutrophils Relative %: 56.5 % (ref 43.0–77.0)
Platelets: 208 10*3/uL (ref 150.0–400.0)
RBC: 4.5 Mil/uL (ref 4.22–5.81)
RDW: 13.4 % (ref 11.5–14.6)
WBC: 10 10*3/uL (ref 4.5–10.5)

## 2010-10-30 LAB — PROTIME-INR
INR: 1 ratio (ref 0.8–1.0)
Prothrombin Time: 11.2 s (ref 10.2–12.4)

## 2010-11-02 ENCOUNTER — Ambulatory Visit (HOSPITAL_COMMUNITY)
Admission: RE | Admit: 2010-11-02 | Discharge: 2010-11-03 | Disposition: A | Discharge status: Home / Self Care | Payer: Medicare Other | Source: Ambulatory Visit | Attending: Cardiovascular Disease | Admitting: Cardiovascular Disease

## 2010-11-02 DIAGNOSIS — F329 Major depressive disorder, single episode, unspecified: Secondary | ICD-10-CM | POA: Insufficient documentation

## 2010-11-02 DIAGNOSIS — Z79899 Other long term (current) drug therapy: Secondary | ICD-10-CM | POA: Insufficient documentation

## 2010-11-02 DIAGNOSIS — Z7982 Long term (current) use of aspirin: Secondary | ICD-10-CM | POA: Insufficient documentation

## 2010-11-02 DIAGNOSIS — I2510 Atherosclerotic heart disease of native coronary artery without angina pectoris: Secondary | ICD-10-CM | POA: Insufficient documentation

## 2010-11-02 DIAGNOSIS — E119 Type 2 diabetes mellitus without complications: Secondary | ICD-10-CM | POA: Insufficient documentation

## 2010-11-02 DIAGNOSIS — M129 Arthropathy, unspecified: Secondary | ICD-10-CM | POA: Insufficient documentation

## 2010-11-02 DIAGNOSIS — K219 Gastro-esophageal reflux disease without esophagitis: Secondary | ICD-10-CM | POA: Insufficient documentation

## 2010-11-02 DIAGNOSIS — L719 Rosacea, unspecified: Secondary | ICD-10-CM | POA: Insufficient documentation

## 2010-11-02 DIAGNOSIS — K5730 Diverticulosis of large intestine without perforation or abscess without bleeding: Secondary | ICD-10-CM | POA: Insufficient documentation

## 2010-11-02 DIAGNOSIS — N400 Enlarged prostate without lower urinary tract symptoms: Secondary | ICD-10-CM | POA: Insufficient documentation

## 2010-11-02 DIAGNOSIS — F3289 Other specified depressive episodes: Secondary | ICD-10-CM | POA: Insufficient documentation

## 2010-11-02 DIAGNOSIS — I25810 Atherosclerosis of coronary artery bypass graft(s) without angina pectoris: Secondary | ICD-10-CM

## 2010-11-02 DIAGNOSIS — E785 Hyperlipidemia, unspecified: Secondary | ICD-10-CM | POA: Insufficient documentation

## 2010-11-02 DIAGNOSIS — Z01812 Encounter for preprocedural laboratory examination: Secondary | ICD-10-CM | POA: Insufficient documentation

## 2010-11-02 LAB — POCT ACTIVATED CLOTTING TIME: Activated Clotting Time: 689 seconds

## 2010-11-02 LAB — GLUCOSE, CAPILLARY: Glucose-Capillary: 121 mg/dL — ABNORMAL HIGH (ref 70–99)

## 2010-11-03 DIAGNOSIS — I200 Unstable angina: Secondary | ICD-10-CM

## 2010-11-03 LAB — BASIC METABOLIC PANEL
BUN: 12 mg/dL (ref 6–23)
CO2: 25 mEq/L (ref 19–32)
Calcium: 9.6 mg/dL (ref 8.4–10.5)
Chloride: 104 mEq/L (ref 96–112)
Creatinine, Ser: 1.01 mg/dL (ref 0.50–1.35)
GFR calc Af Amer: >60 mL/min (ref >60)
GFR calc non Af Amer: >60 mL/min (ref >60)
Glucose, Bld: 136 mg/dL — ABNORMAL HIGH (ref 70–99)
Potassium: 4 mEq/L (ref 3.5–5.1)
Sodium: 139 mEq/L (ref 135–145)

## 2010-11-03 LAB — CBC
HCT: 46.4 % (ref 39.0–52.0)
Hemoglobin: 16.5 g/dL (ref 13.0–17.0)
MCH: 34 pg (ref 26.0–34.0)
MCHC: 35.6 g/dL (ref 30.0–36.0)
MCV: 95.5 fL (ref 78.0–100.0)
Platelets: 171 10*3/uL (ref 150–400)
RBC: 4.86 MIL/uL (ref 4.22–5.81)
RDW: 13.3 % (ref 11.5–15.5)
WBC: 10.4 10*3/uL (ref 4.0–10.5)

## 2010-11-03 LAB — GLUCOSE, CAPILLARY
Glucose-Capillary: 136 mg/dL — ABNORMAL HIGH (ref 70–99)
Glucose-Capillary: 164 mg/dL — ABNORMAL HIGH (ref 70–99)
Glucose-Capillary: 128 mg/dL — ABNORMAL HIGH (ref 70–99)

## 2010-11-09 ENCOUNTER — Encounter: Payer: Self-pay | Admitting: Cardiology

## 2010-11-12 ENCOUNTER — Encounter: Payer: Self-pay | Admitting: Cardiology

## 2010-11-12 DIAGNOSIS — R9430 Abnormal result of cardiovascular function study, unspecified: Secondary | ICD-10-CM | POA: Insufficient documentation

## 2010-11-12 DIAGNOSIS — I2510 Atherosclerotic heart disease of native coronary artery without angina pectoris: Secondary | ICD-10-CM | POA: Insufficient documentation

## 2010-11-13 ENCOUNTER — Encounter: Payer: Self-pay | Admitting: Cardiology

## 2010-11-13 ENCOUNTER — Ambulatory Visit (INDEPENDENT_AMBULATORY_CARE_PROVIDER_SITE_OTHER): Payer: Medicare Other | Admitting: Cardiology

## 2010-11-13 DIAGNOSIS — R0600 Dyspnea, unspecified: Secondary | ICD-10-CM

## 2010-11-13 DIAGNOSIS — R0989 Other specified symptoms and signs involving the circulatory and respiratory systems: Secondary | ICD-10-CM

## 2010-11-13 DIAGNOSIS — I2510 Atherosclerotic heart disease of native coronary artery without angina pectoris: Secondary | ICD-10-CM

## 2010-11-13 DIAGNOSIS — R0609 Other forms of dyspnea: Secondary | ICD-10-CM

## 2010-11-13 DIAGNOSIS — K219 Gastro-esophageal reflux disease without esophagitis: Secondary | ICD-10-CM

## 2010-11-13 NOTE — Patient Instructions (Signed)
Your physician recommends that you schedule a follow-up appointment in: 3 months.  

## 2010-11-13 NOTE — Assessment & Plan Note (Signed)
Patient is doing extremely well at this time.  He is reverse turn to his activities.  He is not having chest pain or shortness of breath.  His Nexium was changed to Protonix specifically related to Plavix therapy for his complex coronary disease.  We will be sure that he can get Protonix.

## 2010-11-13 NOTE — Progress Notes (Signed)
HPI Sean Lozano is seen today post procedures to his coronaries.  I had seen him last on the day that he and his nuclear scan was markedly abnormal.  He had chest tightness and shortness of breath symptoms.  Catheterization was done by Dr. Excell Seltzer.  Careful consideration was given to possible redo surgery the decision was made that this was not the best option.  Decision was made to proceed with staged intervention to the circumflex and the LAD after the LIMA.  The circumflex lesion was done first.  Sean Lozano was then seen back in the office on October 29, 2010. Decision then was made to proceed with the intervention to the LAD.  This was done successfully on November 02, 2010.The Sean Lozano has returned exercising.  He is not having chest discomfort or shortness of breath.  I have carefully reviewed the office note from Dr. Excell Seltzer.  I have reviewed the Note from both of his procedures.  I have discussed his status with him completely today.  He is doing extremely well. Allergies  Allergen Reactions  . Atenolol   . Codeine   . Ezetimibe-Simvastatin   . Rosuvastatin   . Simvastatin     Current Outpatient Prescriptions  Medication Sig Dispense Refill  . albuterol (PROVENTIL HFA) 108 (90 BASE) MCG/ACT inhaler Inhale 2 puffs into the lungs every 6 (six) hours as needed.        Marland Kitchen aspirin (ASPIRIN EC) 81 MG EC tablet Take 81 mg by mouth daily.        . Cholecalciferol (VITAMIN D3) 2000 UNITS TABS Take 1 tablet by mouth daily.       . clopidogrel (PLAVIX) 75 MG tablet Take 1 tablet by mouth Daily.        . Fluticasone-Salmeterol (ADVAIR DISKUS) 250-50 MCG/DOSE AEPB Inhale 1 puff into the lungs 2 (two) times daily.  1 each  11  . metoprolol tartrate (LOPRESSOR) 25 MG tablet 1/2 tab po bid      . montelukast (SINGULAIR) 10 MG tablet Take 1 tablet (10 mg total) by mouth at bedtime.  90 tablet  3  . mupirocin (BACTROBAN) 2 % cream as needed.        . niacin (NIASPAN) 1000 MG CR tablet Take 1,000 mg by mouth daily.          Marland Kitchen NITROSTAT 0.4 MG SL tablet PLACE 1 TABLET WITH THE UNSET OF CHEST PAIN UNDER TONGUE EVERY 5 MINUTES UP TO 3 DOSES. IF PAIN STILL PERSIST CALL EMERGENCY PERSONAL.      Marland Kitchen pantoprazole (PROTONIX) 40 MG tablet Take 1 tablet by mouth Daily.      . pravastatin (PRAVACHOL) 40 MG tablet TAKE 1 TABLET EVERY DAY  30 tablet  9  . Tamsulosin HCl (FLOMAX) 0.4 MG CAPS TAKE 1 CAPSULE BY MOUTH EVERY DAY  30 capsule  11    History   Social History  . Marital Status: Married    Spouse Name: N/A    Number of Children: 3  . Years of Education: N/A   Occupational History  . retired-real estate    Social History Main Topics  . Smoking status: Never Smoker   . Smokeless tobacco: Not on file  . Alcohol Use: Yes  . Drug Use: No  . Sexually Active: Not on file   Other Topics Concern  . Not on file   Social History Narrative   Pt has 51yo son-paranoid schizophrenic, unempolyed, lives with him.    Family History  Problem Relation Age  of Onset  . Heart disease Father     mother  . Aneurysm    . Hyperlipidemia    . Liver cancer      grandfather  . Diabetes Sister     mother    Past Medical History  Diagnosis Date  . CAD (coronary artery disease)     Catheterization, October 13 2010, severe disease, surgical team recommended PCI, staged PCI to the SVG to OM1 and 2,   . Hyperlipidemia     intolerance to Crestor Zocor Vytorin. He tolerates Pravachol...low HDL  . GERD (gastroesophageal reflux disease)   . Rosacea   . Prostatitis   . Depression   . Benign prostatic hypertrophy   . Asthma   . Paresthesias     successful surgery by Dr. Julio Sicks  . Arthritis   . Diabetes mellitus     type II-diet  . Diverticulosis   . Hx of CABG     1992  . Ejection fraction     EF 60%, echo, June, 2012    Past Surgical History  Procedure Date  . C-spine disc surgery 03/2006  . C4-5 surgery 01/2009  . Cardiac catheterization     stent placed  . Coronary artery bypass graft   . Appendectomy   .  Tonsillectomy   . Coccyx fracture surgery 1997    ROS  Sean Lozano denies fever, chills, headache, sweats, rash, change in vision, change in hearing, chest pain, cough, nausea vomiting, urinary symptoms.  All other systems are reviewed and are negative.  PHYSICAL EXAM Sean Lozano looks quite good.  He is here with his wife today.  He is oriented to person time and place.  Affect is normal.  Head is atraumatic.  Lungs are clear.  Respiratory effort is unlabored.  Cardiac exam reveals S1-S2.  There are no clicks or significant murmurs.  The abdomen is soft.  There is no peripheral edema.  There is slight ecchymosis at the radial catheter site in the left arm.  There no musculoskeletal deformities.  There are no skin rashes. Filed Vitals:   11/13/10 0955  BP: 129/79  Pulse: 69  Height: 5\' 11"  (1.803 m)  Weight: 214 lb (97.07 kg)    EKG Is done today and reviewed by me.  He has normal sinus rhythm.  There is no significant change from the past.  ASSESSMENT & PLAN

## 2010-11-13 NOTE — Assessment & Plan Note (Signed)
Dyspnea was an anginal equivalent.  It is greatly improved.

## 2010-11-13 NOTE — Assessment & Plan Note (Signed)
Treatment of his GERD is very important.  We will be sure that he can get protonic.

## 2010-11-14 ENCOUNTER — Other Ambulatory Visit: Payer: Self-pay | Admitting: Cardiology

## 2010-11-16 ENCOUNTER — Telehealth: Payer: Self-pay | Admitting: Cardiology

## 2010-11-16 MED ORDER — PANTOPRAZOLE SODIUM 40 MG PO TBEC: 40.0000 mg | DELAYED_RELEASE_TABLET | Freq: Every day | ORAL | Status: DC

## 2010-11-16 NOTE — Telephone Encounter (Signed)
Pt needs refill on protonox 40mg  qd called in [facility] as[facility] pt was here Friday and it was to be sent in and they still don't have it

## 2010-11-17 ENCOUNTER — Other Ambulatory Visit: Payer: Self-pay | Admitting: Internal Medicine

## 2010-11-17 MED ORDER — PRAVASTATIN SODIUM 40 MG PO TABS: 40.0000 mg | ORAL_TABLET | Freq: Every day | ORAL | Status: DC

## 2010-11-17 NOTE — Progress Notes (Signed)
  Patient was seen on 11/18/10 for the first of a series of three diabetes self-management courses at [facility]. The following learning objectives were met by the patient during this course:   Defines diabetes and the role of insulin  Identifies type of diabetes and pathophysiology  States normal BG range and personal goals  Identifies three risk factors for the development of diabetes  States the need for and frequency of healthcare follow up (ADA Standards of Care)  Lab Results  Component Value Date   HGBA1C 7.0* 09/29/2010    Patient has established the following initial goals:  Check blood glucose levels  Follow a diabetes meal plan  Follow-Up Plan: Core Classes for Diabetes @ [facility] in August 2012

## 2010-11-17 NOTE — Telephone Encounter (Signed)
Prior authorization pantoprazole 40 mg. [facility].

## 2010-11-18 ENCOUNTER — Encounter: Payer: Medicare Other | Attending: Internal Medicine

## 2010-11-18 DIAGNOSIS — Z713 Dietary counseling and surveillance: Secondary | ICD-10-CM | POA: Insufficient documentation

## 2010-11-18 DIAGNOSIS — E119 Type 2 diabetes mellitus without complications: Secondary | ICD-10-CM | POA: Insufficient documentation

## 2010-11-20 ENCOUNTER — Ambulatory Visit (INDEPENDENT_AMBULATORY_CARE_PROVIDER_SITE_OTHER): Payer: Medicare Other | Admitting: Internal Medicine

## 2010-11-20 ENCOUNTER — Encounter: Payer: Self-pay | Admitting: Internal Medicine

## 2010-11-20 DIAGNOSIS — E119 Type 2 diabetes mellitus without complications: Secondary | ICD-10-CM

## 2010-11-20 DIAGNOSIS — F329 Major depressive disorder, single episode, unspecified: Secondary | ICD-10-CM

## 2010-11-20 DIAGNOSIS — E785 Hyperlipidemia, unspecified: Secondary | ICD-10-CM

## 2010-11-20 MED ORDER — ONETOUCH ULTRASOFT LANCETS MISC: Status: DC

## 2010-11-20 MED ORDER — GLUCOSE BLOOD VI STRP: ORAL_STRIP | Status: DC

## 2010-11-20 NOTE — Patient Instructions (Addendum)
Continue all other medications as before You are given the one touch glucometer today The prescription for the once daily strips and lancets have been sent to the pharmacy You should check your sugars most often in the AM before bfast, but also some days before dinner, to get a better idea of how your sugar reacts later in the day as well

## 2010-11-20 NOTE — Progress Notes (Signed)
Subjective:    Patient ID: Sean Lozano, male    DOB: [birth year], 77 y.o.   MRN: [id number]  HPI  Here to f/u after recent stress test, cath and several stents and now doing even better than late July in that he is up close to 3 MPH on the treadmill.  Here to f/u; overall doing ok,  Pt denies chest pain, increased sob or doe, wheezing, orthopnea, PND, increased LE swelling, palpitations, dizziness or syncope.  Pt denies new neurological symptoms such as new headache, or facial or extremity weakness or numbness   Pt denies polydipsia, polyuria, or low sugar symptoms such as weakness or confusion improved with po intake.  Pt states overall good compliance with meds, trying to follow lower cholesterol, diabetic diet, wt overall stable but little exercise however. CBG's in lower 100's.  Has started DM education - needs glucomter and strips. Wt overall down from 228 gto 215 today since June 2011 Denies worsening depressive symptoms, suicidal ideation, or panic  Past Medical History  Diagnosis Date  . CAD (coronary artery disease)     Catheterization, October 13 2010, severe disease, surgical team recommended PCI, staged PCI to the SVG to OM1 and 2,   . Hyperlipidemia     intolerance to Crestor Zocor Vytorin. He tolerates Pravachol...low HDL  . GERD (gastroesophageal reflux disease)   . Rosacea   . Prostatitis   . Depression   . Benign prostatic hypertrophy   . Asthma   . Paresthesias     successful surgery by Dr. Julio Sicks  . Arthritis   . Diabetes mellitus     type II-diet  . Diverticulosis   . Hx of CABG     1992  . Ejection fraction     EF 60%, echo, June, 2012   Past Surgical History  Procedure Date  . C-spine disc surgery 03/2006  . C4-5 surgery 01/2009  . Cardiac catheterization     stent placed  . Coronary artery bypass graft   . Appendectomy   . Tonsillectomy   . Coccyx fracture surgery 1997    reports that he has never smoked. He does not have any smokeless tobacco history  on file. He reports that he drinks alcohol. He reports that he does not use illicit drugs. family history includes Aneurysm in an unspecified family member; Diabetes in his sister; Heart disease in his father; Hyperlipidemia in an unspecified family member; and Liver cancer in an unspecified family member. Allergies  Allergen Reactions  . Atenolol   . Codeine   . Ezetimibe-Simvastatin   . Rosuvastatin   . Simvastatin    Current Outpatient Prescriptions on File Prior to Visit  Medication Sig Dispense Refill  . albuterol (PROVENTIL HFA) 108 (90 BASE) MCG/ACT inhaler Inhale 2 puffs into the lungs every 6 (six) hours as needed.        Marland Kitchen aspirin (ASPIRIN EC) 81 MG EC tablet Take 81 mg by mouth daily.        . Cholecalciferol (VITAMIN D3) 2000 UNITS TABS Take 1 tablet by mouth daily.       . clopidogrel (PLAVIX) 75 MG tablet Take 1 tablet by mouth Daily.        . Fluticasone-Salmeterol (ADVAIR DISKUS) 250-50 MCG/DOSE AEPB Inhale 1 puff into the lungs 2 (two) times daily.  1 each  11  . metoprolol succinate (TOPROL-XL) 25 MG 24 hr tablet TAKE 1/2 TABLET TWICE A DAY  30 tablet  6  . metoprolol tartrate (LOPRESSOR)  25 MG tablet 1/2 tab po bid      . montelukast (SINGULAIR) 10 MG tablet Take 1 tablet (10 mg total) by mouth at bedtime.  90 tablet  3  . mupirocin (BACTROBAN) 2 % cream as needed.        Marland Kitchen NIASPAN 1000 MG CR tablet TAKE 1 TABLET BY MOUTH AT BEDTIME AS DIRECTED.TAKE 1 ASPIRIN 1/2 HR PRIOR TO NIASPAN  30 tablet  10  . NITROSTAT 0.4 MG SL tablet PLACE 1 TABLET WITH THE UNSET OF CHEST PAIN UNDER TONGUE EVERY 5 MINUTES UP TO 3 DOSES. IF PAIN STILL PERSIST CALL EMERGENCY PERSONAL.      Marland Kitchen pantoprazole (PROTONIX) 40 MG tablet Take 1 tablet (40 mg total) by mouth daily.  90 tablet  3  . pravastatin (PRAVACHOL) 40 MG tablet Take 1 tablet (40 mg total) by mouth daily.  30 tablet  9  . Tamsulosin HCl (FLOMAX) 0.4 MG CAPS TAKE 1 CAPSULE BY MOUTH EVERY DAY  30 capsule  11   Review of Systems Review  of Systems  Constitutional: Negative for diaphoresis and unexpected weight change.  HENT: Negative for drooling and tinnitus.   Eyes: Negative for photophobia and visual disturbance.  Respiratory: Negative for choking and stridor.   Gastrointestinal: Negative for vomiting and blood in stool.  Genitourinary: Negative for hematuria and decreased urine volume.        Objective:   Physical Exam BP 100/68  Pulse 70  Temp(Src) 97.6 F (36.4 C) (Oral)  Ht 5\' 11"  (1.803 m)  Wt 215 lb 2 oz (97.58 kg)  BMI 30.00 kg/m2  SpO2 95% Physical Exam  VS noted Constitutional: Pt appears well-developed and well-nourished.  HENT: Head: Normocephalic.  Right Ear: External ear normal.  Left Ear: External ear normal.  Eyes: Conjunctivae and EOM are normal. Pupils are equal, round, and reactive to light.  Neck: Normal range of motion. Neck supple.  Cardiovascular: Normal rate and regular rhythm.   Pulmonary/Chest: Effort normal and breath sounds normal.  Abd:  Soft, NT, non-distended, + BS Neurological: Pt is alert. No cranial nerve deficit.  Skin: Skin is warm. No erythema.  Psychiatric: Pt behavior is normal. Thought content normal. not  Depressed affect       Assessment & Plan:

## 2010-11-21 ENCOUNTER — Encounter: Payer: Self-pay | Admitting: Internal Medicine

## 2010-11-21 NOTE — Assessment & Plan Note (Signed)
stable overall by hx and exam, most recent data reviewed with pt, and pt to continue medical treatment as before  Lab Results  Component Value Date   LDLCALC 79 09/29/2010    

## 2010-11-21 NOTE — Assessment & Plan Note (Signed)
stable overall by hx and exam, most recent data reviewed with pt, and pt to continue medical treatment as before  Lab Results  Component Value Date   WBC 10.4 11/03/2010   HGB 16.5 11/03/2010   HCT 46.4 11/03/2010   PLT 171 11/03/2010   CHOL 146 09/29/2010   TRIG 186.0* 09/29/2010   HDL 30.30* 09/29/2010   LDLDIRECT 87.6 09/19/2008   ALT 22 09/29/2010   AST 23 09/29/2010   NA 139 11/03/2010   K 4.0 11/03/2010   CL 104 11/03/2010   CREATININE 1.01 11/03/2010   BUN 12 11/03/2010   CO2 25 11/03/2010   TSH 1.94 09/29/2010   PSA 1.30 09/29/2010   INR 1.0 10/29/2010   HGBA1C 7.0* 09/29/2010

## 2010-11-21 NOTE — Assessment & Plan Note (Signed)
stable overall by hx and exam, most recent data reviewed with pt, and pt to continue medical treatment as before  Lab Results  Component Value Date   HGBA1C 7.0* 09/29/2010

## 2010-11-24 NOTE — Discharge Summary (Signed)
NAMETYTAN, SANDATE NO.:  [id number]  MEDICAL RECORD NO.:  [id number]  LOCATION:  6529                         FACILITY:  [facility]  PHYSICIAN:  Veverly Fells. Excell Seltzer, MD  DATE OF BIRTH:  1933/08/21  DATE OF ADMISSION:  11/02/2010 DATE OF DISCHARGE:  11/03/2010                              DISCHARGE SUMMARY   PRIMARY CARDIOLOGIST:  Luis Abed, MD, [facility]  PRIMARY CARE PROVIDER:  Corwin Levins, MD  DISCHARGE DIAGNOSIS:  Unstable angina.  SECONDARY DIAGNOSES: 1. Coronary artery disease status post prior coronary artery bypass     grafting x6 in 1992 with recent drug-eluting stent placement x2 to     the sequential vein graft to first second obtuse marginal in July     2012 with plan for stage procedure to the mid left anterior     descending. 2. Hyperlipidemia. 3. Diet-controlled diabetes mellitus. 4. Benign prostatic hypertrophy. 5. Asthma. 6. Arthritis. 7. Depression. 8. Rosacea 9. Diverticulosis. 10.Status post cervical spine surgery for degenerative arthritis. 11.Status post appendectomy. 12.Status post tonsillectomy.  ALLERGIES:  ATENOLOL, CODEINE, VYTORIN, ROSUVASTATIN, SIMVASTATIN.  PROCEDURES:  Successful PCI and stenting of the mid-LAD via the left internal mammary artery graft with placement of 2.25 x 20 mm Promus Element Plus drug-eluting stent.  HISTORY OF PRESENT ILLNESS:  A 77 year old male with prior history of coronary artery disease status post coronary artery bypass grafting in the late 90s who was recently admitted to [facility] between June 9 and July 3 for chest pain.  During that time the patient had no abnormal stress Myoview with exertional angina and subsequently underwent diagnostic catheterization showing severe native coronary artery disease as well as the graft disease.  He was evaluated by Cardiothoracic Surgery, but was not felt to be a good candidate for redo bypass and therefore he underwent PCI and drug-eluting stent  placement x2 to the sequential vein graft to the first and second obtuse marginals with good result.  The patient was also noted at that time to have mid LAD disease beyond the anastomosis of the left internal mammary artery and it was felt this could be intervened upon in a staged approach.  The patient was subsequently discharged home and followed up with Dr. Myrtis Ser in the office on July 19.  He had been doing well with improved symptoms overall.  Films were reviewed and decision was made to pursue staged intervention to the LAD.  HOSPITAL COURSE:  The patient presented to the [facility] on July 23 and underwent successful placement of a 2.25 x 20-mm Promus Element Plus drug-eluting stent to the native LAD through the left internal mammary artery graft.  The patient tolerated this procedure well and postprocedure has been ambulating without recurrent symptoms or limitations.  He has been seen by Cardiac Rehab with outpatient referral made.  He will be discharged home today in good condition.  DISCHARGE LABS:  Hemoglobin 16.5, hematocrit 46.4, WBC 10.4, platelets 171.  Sodium 139, potassium 4.0, chloride 104, CO2 25, BUN 12, creatinine 0.01, glucose 136.  DISPOSITION:  The patient will be discharged home today in good condition.  FOLLOWUP PLANS AND APPOINTMENTS:  The patient will  follow up with Dr. Willa Rough on August 3 at 9:45 a.m.  Follow up with Dr. Jonny Ruiz as previously scheduled.  DISCHARGE MEDICATIONS: 1. Nitroglycerin 0.4 mg sublingual p.r.n. chest pain. 2. Advair Diskus 250/50 one puff b.i.d. 3. Albuterol inhaler 90 mcg 2 puffs q.6 h. p.r.n. 4. Aspirin 81 mg daily. 5. Cholecalciferol 2000 units daily. 6. Plavix 75 mg daily. 7. Metoprolol tartrate 25 mg half a tablet b.i.d. 8. Mupirocin topical 2% every other day as needed. 9. Niaspan SR 1000 mg daily. 10.Pravastatin 40 mg daily. 11.Protonix 40 mg daily. 12.Singular 10 mg nightly. 13.Tamsulosin 0.4 mg  daily.  OUTSTANDING LAB STUDIES:  None.  DURATION OF DISCHARGE ENCOUNTER:  35 minutes including physician time.     Nicolasa Ducking, ANP   ______________________________ Veverly Fells. Excell Seltzer, MD    CB/MEDQ  D:  11/03/2010  T:  11/03/2010  Job:  [id number]  cc:   Dr. Jonny Ruiz  Electronically Signed by Nicolasa Ducking ANP on 11/10/2010 07:25:44 PM Electronically Signed by Tonny Bollman MD on 11/24/2010 05:21:32 AM

## 2010-11-24 NOTE — Cardiovascular Report (Signed)
  NAMECOLLIER, BOHNET NO.:  [id number]  MEDICAL RECORD NO.:  [id number]  LOCATION:  6529                         FACILITY:  [facility]  PHYSICIAN:  Veverly Fells. Excell Seltzer, MD  DATE OF BIRTH:  1933/09/21  DATE OF PROCEDURE:  11/02/2010 DATE OF DISCHARGE:                           CARDIAC CATHETERIZATION   PROCEDURE:  PTCA and stenting of the mid-LAD.  PROCEDURAL INDICATIONS:  [name] is a 77 year old gentleman with extensive coronary artery disease.  He recently underwent complex PCI involving a saphenous vein graft to the obtuse marginal branches.  He presents today for staged intervention of the LAD.  The patient has severe 95% edge restenosis in the mid-LAD beyond the LIMA anastomosis.  Risks and indications of the procedure were reviewed with the patient. Informed consent was obtained.  Left wrist was prepped, draped and anesthetized with 1% lidocaine.  Using the modified Seldinger technique, a 6-French sheath was placed into the left radial artery without difficulty.  A 3 mg of verapamil was administered through the sheath. Angiomax was started for anticoagulation.  Once the therapeutic ACT was achieved, a 90-cm left internal mammary guide catheter was inserted. The LIMA was difficult to engage, but eventually we were able to selectively engage the mammary artery and wired the vessel, selective angiography confirmed critical mid-LAD stenosis with significant lesions of both the proximal and distal edge of a previously placed 2.5 x 16-mm Taxus drug-eluting stent.  A Cougar guidewire was advanced across the lesion with a moderate amount of difficulty and then the vessel was predilated with a 2.0 x 15-mm Emerge balloon, which was dilated to 10 atmospheres on a single inflation.  The vessel was then stented with a 2.25 x 28-mm Promus Element drug-eluting stent, which was carefully positioned, so that both lesions of the proximal and distal edge of the stent were  covered.  It was deployed at 12 atmospheres and appeared well expanded.  The stented segment was then postdilated with a 2.5 x 20-mm [place] Quantum apex balloon, which was taken to 16 atmospheres on two inflations.  The stented segment had 0% residual stenosis.  There was an excellent angiographic result.  The patient tolerated the procedure well.  A TR band was placed for radial hemostasis.  FINAL CONCLUSION:  Successful PCI of severe stenosis in the mid-LAD using a single long drug-eluting stent.  RECOMMENDATIONS:  The patient should continue on dual-antiplatelet therapy with aspirin and Plavix for a minimum of 12 months and I would favor lifelong therapy with his bypass graft disease and multivessel stenting.     Veverly Fells. Excell Seltzer, MD     MDC/MEDQ  D:  11/02/2010  T:  11/02/2010  Job:  [id number]  cc:   Corwin Levins, MD Luis Abed, MD, [facility]  Electronically Signed by Tonny Bollman MD on 11/24/2010 05:21:34 AM

## 2010-11-26 ENCOUNTER — Encounter: Payer: Medicare Other | Admitting: Dietician

## 2010-12-03 ENCOUNTER — Encounter: Payer: Medicare Other | Admitting: Dietician

## 2010-12-03 NOTE — Progress Notes (Signed)
  Patient was seen on 12/03/2010 for the third of a series of three diabetes self-management courses at the [facility]. The following learning objectives were met by the patient during this course:   Identifies nutrient effects on glycemia  States the general guidelines of meal planning  Relates understanding of personal meal plan  Describes situations that cause stress and discuss methods of stress management  Identifies lifestyle behaviors for change  The following handouts were given in class:  [employer] Carbohydrate Counting book  3 Month Follow Up Visit handout  Goal setting handout  Class evaluation form  Your patient has established the following 3 month goals for diabetes self-care:  Loose 15-20 lbs from 215  Walk or bike for 30 minutes 4 times per week.  Check my glucose 7-14 times per week.  Follow-Up Plan: Patient will attend a 3 month follow-up visit for diabetes self-management education.

## 2010-12-29 ENCOUNTER — Other Ambulatory Visit: Payer: Self-pay

## 2010-12-29 DIAGNOSIS — E119 Type 2 diabetes mellitus without complications: Secondary | ICD-10-CM

## 2010-12-29 MED ORDER — ONETOUCH ULTRASOFT LANCETS MISC: Status: DC

## 2010-12-29 MED ORDER — GLUCOSE BLOOD VI STRP: ORAL_STRIP | Status: DC

## 2011-01-04 ENCOUNTER — Other Ambulatory Visit: Payer: Self-pay

## 2011-01-04 DIAGNOSIS — E119 Type 2 diabetes mellitus without complications: Secondary | ICD-10-CM

## 2011-01-04 MED ORDER — GLUCOSE BLOOD VI STRP: ORAL_STRIP | Status: DC

## 2011-01-04 MED ORDER — ONETOUCH ULTRASOFT LANCETS MISC: Status: DC

## 2011-01-08 ENCOUNTER — Telehealth: Payer: Self-pay

## 2011-01-08 MED ORDER — ONETOUCH DELICA LANCETS MISC: Status: DC

## 2011-01-08 NOTE — Telephone Encounter (Signed)
A user error has taken place: encounter opened in error, closed for administrative reasons.

## 2011-02-19 ENCOUNTER — Ambulatory Visit: Payer: Medicare Other | Admitting: Cardiology

## 2011-02-24 ENCOUNTER — Encounter: Payer: Medicare Other | Attending: Internal Medicine | Admitting: Dietician

## 2011-02-24 ENCOUNTER — Ambulatory Visit (INDEPENDENT_AMBULATORY_CARE_PROVIDER_SITE_OTHER): Payer: Medicare Other | Admitting: Cardiology

## 2011-02-24 ENCOUNTER — Encounter: Payer: Self-pay | Admitting: Cardiology

## 2011-02-24 ENCOUNTER — Encounter: Payer: Self-pay | Admitting: Dietician

## 2011-02-24 DIAGNOSIS — Z713 Dietary counseling and surveillance: Secondary | ICD-10-CM | POA: Insufficient documentation

## 2011-02-24 DIAGNOSIS — E119 Type 2 diabetes mellitus without complications: Secondary | ICD-10-CM | POA: Insufficient documentation

## 2011-02-24 DIAGNOSIS — R0989 Other specified symptoms and signs involving the circulatory and respiratory systems: Secondary | ICD-10-CM

## 2011-02-24 DIAGNOSIS — R0600 Dyspnea, unspecified: Secondary | ICD-10-CM

## 2011-02-24 DIAGNOSIS — I2510 Atherosclerotic heart disease of native coronary artery without angina pectoris: Secondary | ICD-10-CM

## 2011-02-24 DIAGNOSIS — R0609 Other forms of dyspnea: Secondary | ICD-10-CM

## 2011-02-24 MED ORDER — CLOPIDOGREL BISULFATE 75 MG PO TABS: 75.0000 mg | ORAL_TABLET | Freq: Every day | ORAL | Status: DC

## 2011-02-24 MED ORDER — PRAVASTATIN SODIUM 40 MG PO TABS: 40.0000 mg | ORAL_TABLET | Freq: Every day | ORAL | Status: DC

## 2011-02-24 MED ORDER — NIACIN ER (ANTIHYPERLIPIDEMIC) 1000 MG PO TBCR: 1000.0000 mg | EXTENDED_RELEASE_TABLET | Freq: Every day | ORAL | Status: DC

## 2011-02-24 MED ORDER — METOPROLOL SUCCINATE ER 25 MG PO TB24: 12.5000 mg | ORAL_TABLET | Freq: Two times a day (BID) | ORAL | Status: DC

## 2011-02-24 NOTE — Assessment & Plan Note (Signed)
Coronary disease is stable. No change in therapy. 

## 2011-02-24 NOTE — Assessment & Plan Note (Signed)
He is not having any significant dyspnea.  No change in therapy.

## 2011-02-24 NOTE — Progress Notes (Signed)
  Patient was seen on 02/24/2011 for their 3 month follow-up as a part of the diabetes self-management courses at the [facility]. The following learning objectives were met by your patient during this course:  Please see Diabetes Flow sheet for findings related to patient's self-care.  Patient self reports the following:  Diabetes control has improved since diabetes self-management training: He feels it has. Number of days blood glucose is >200: None, close at 199 after a dinner meal. Last MD appointment for diabetes: Scheduled for April 01, 2011. Changes in treatment plan: None Confidence with ability to manage diabetes: Feels more confident, but frustrated with the way the glucose fluctuates. Areas for improvement with diabetes self-care: Has lost 5 lbs since August.  Wants to get under 200 lbs as his next goal.  Wants to stay active and continue to exercise on a regular basis. Willingness to participate in diabetes support group: Willing but prefers a group that meets in the day time.   Follow-Up Plan: Patient is eligible for a "free" 30 minute diabetes self-care appointment in the next year. Patient to call and schedule as needed.

## 2011-02-24 NOTE — Patient Instructions (Signed)
Your physician recommends that you schedule a follow-up appointment in: 6 months  

## 2011-02-24 NOTE — Progress Notes (Signed)
HPI Patient is seen today for followup coronary disease.  In July he had staged PCI.  This was done at 2 different times.  He had very significant disease and he has had an excellent result.  He is not having chest tightness or shortness of breath.  He is exercising more he is definitely losing weight.  He seems quite motivated.  Allergies  Allergen Reactions  . Atenolol   . Codeine   . Ezetimibe-Simvastatin   . Rosuvastatin   . Simvastatin     Current Outpatient Prescriptions  Medication Sig Dispense Refill  . albuterol (PROVENTIL HFA) 108 (90 BASE) MCG/ACT inhaler Inhale 2 puffs into the lungs every 6 (six) hours as needed.        Marland Kitchen aspirin (ASPIRIN EC) 81 MG EC tablet Take 81 mg by mouth daily.        . Cholecalciferol (VITAMIN D3) 2000 UNITS TABS Take 1 tablet by mouth daily.       . clopidogrel (PLAVIX) 75 MG tablet Take 1 tablet by mouth Daily.        . Fluticasone-Salmeterol (ADVAIR DISKUS) 250-50 MCG/DOSE AEPB Inhale 1 puff into the lungs 2 (two) times daily.  1 each  11  . glucose blood (ONE TOUCH ULTRA TEST) test strip Use as directed once daily. 250.00  100 each  12  . metoprolol succinate (TOPROL-XL) 25 MG 24 hr tablet TAKE 1/2 TABLET TWICE A DAY  30 tablet  6  . metoprolol tartrate (LOPRESSOR) 25 MG tablet 1/2 tab po bid      . montelukast (SINGULAIR) 10 MG tablet Take 1 tablet (10 mg total) by mouth at bedtime.  90 tablet  3  . mupirocin (BACTROBAN) 2 % cream as needed.        Marland Kitchen NIASPAN 1000 MG CR tablet TAKE 1 TABLET BY MOUTH AT BEDTIME AS DIRECTED.TAKE 1 ASPIRIN 1/2 HR PRIOR TO NIASPAN  30 tablet  10  . NITROSTAT 0.4 MG SL tablet PLACE 1 TABLET WITH THE UNSET OF CHEST PAIN UNDER TONGUE EVERY 5 MINUTES UP TO 3 DOSES. IF PAIN STILL PERSIST CALL EMERGENCY PERSONAL.      Marland Kitchen ONETOUCH DELICA LANCETS MISC Use as directed once daily 250.00  100 each  11  . pantoprazole (PROTONIX) 40 MG tablet Take 1 tablet (40 mg total) by mouth daily.  90 tablet  3  . pravastatin (PRAVACHOL) 40 MG  tablet Take 1 tablet (40 mg total) by mouth daily.  30 tablet  9  . Tamsulosin HCl (FLOMAX) 0.4 MG CAPS TAKE 1 CAPSULE BY MOUTH EVERY DAY  30 capsule  11    History   Social History  . Marital Status: Married    Spouse Name: N/A    Number of Children: 3  . Years of Education: N/A   Occupational History  . retired-real estate    Social History Main Topics  . Smoking status: Never Smoker   . Smokeless tobacco: Not on file  . Alcohol Use: Yes  . Drug Use: No  . Sexually Active: Not on file   Other Topics Concern  . Not on file   Social History Narrative   Pt has 51yo son-paranoid schizophrenic, unempolyed, lives with him.    Family History  Problem Relation Age of Onset  . Heart disease Father     mother  . Aneurysm    . Hyperlipidemia    . Liver cancer      grandfather  . Diabetes Sister  mother    Past Medical History  Diagnosis Date  . CAD (coronary artery disease)     Catheterization, October 13 2010, severe disease, surgical team recommended PCI, staged PCI to the SVG to OM1 and 2,   . Hyperlipidemia     intolerance to Crestor Zocor Vytorin. He tolerates Pravachol...low HDL  . GERD (gastroesophageal reflux disease)   . Rosacea   . Prostatitis   . Depression   . Benign prostatic hypertrophy   . Asthma   . Paresthesias     successful surgery by Dr. Julio Sicks  . Arthritis   . Diabetes mellitus     type II-diet  . Diverticulosis   . Hx of CABG     1992  . Ejection fraction     EF 60%, echo, June, 2012    Past Surgical History  Procedure Date  . C-spine disc surgery 03/2006  . C4-5 surgery 01/2009  . Cardiac catheterization     stent placed  . Coronary artery bypass graft   . Appendectomy   . Tonsillectomy   . Coccyx fracture surgery 1997    ROS  Patient denies fever, chills, headache, sweats, rash, change in vision, change in hearing, chest pain, cough, nausea vomiting, urinary symptoms.  All other systems are reviewed and are  negative.  PHYSICAL EXAM Patient is oriented to person time and place.  Affect is normal.  He is here with his wife.  There is no jugulovenous distention.  Lungs are clear.  Respiratory effort is unlabored.  Cardiac exam reveals S1-S2.  No clicks or significant murmurs.  The abdomen is soft.  There is no peripheral edema. Filed Vitals:   02/24/11 1505  BP: 122/72  Pulse: 55  Resp: 18  Height: 5\' 11"  (1.803 m)  Weight: 206 lb (93.441 kg)      ASSESSMENT & PLAN

## 2011-03-31 ENCOUNTER — Encounter: Payer: Self-pay | Admitting: Internal Medicine

## 2011-04-01 ENCOUNTER — Ambulatory Visit (INDEPENDENT_AMBULATORY_CARE_PROVIDER_SITE_OTHER): Payer: Medicare Other | Admitting: Internal Medicine

## 2011-04-01 ENCOUNTER — Encounter: Payer: Self-pay | Admitting: Internal Medicine

## 2011-04-01 ENCOUNTER — Other Ambulatory Visit (INDEPENDENT_AMBULATORY_CARE_PROVIDER_SITE_OTHER): Payer: Medicare Other

## 2011-04-01 DIAGNOSIS — N529 Male erectile dysfunction, unspecified: Secondary | ICD-10-CM

## 2011-04-01 DIAGNOSIS — K219 Gastro-esophageal reflux disease without esophagitis: Secondary | ICD-10-CM

## 2011-04-01 DIAGNOSIS — E785 Hyperlipidemia, unspecified: Secondary | ICD-10-CM

## 2011-04-01 DIAGNOSIS — E119 Type 2 diabetes mellitus without complications: Secondary | ICD-10-CM

## 2011-04-01 LAB — BASIC METABOLIC PANEL
BUN: 18 mg/dL (ref 6–23)
CO2: 27 mEq/L (ref 19–32)
Calcium: 9.2 mg/dL (ref 8.4–10.5)
Chloride: 107 mEq/L (ref 96–112)
Creatinine, Ser: 1.2 mg/dL (ref 0.4–1.5)
GFR: 63.19 mL/min (ref >60.00)
Glucose, Bld: 118 mg/dL — ABNORMAL HIGH (ref 70–99)
Potassium: 4.2 mEq/L (ref 3.5–5.1)
Sodium: 141 mEq/L (ref 135–145)

## 2011-04-01 LAB — LIPID PANEL
Cholesterol: 138 mg/dL (ref 0–200)
HDL: 40.7 mg/dL (ref >39.00)
LDL Cholesterol: 74 mg/dL (ref 0–99)
Total CHOL/HDL Ratio: 3
Triglycerides: 117 mg/dL (ref 0.0–149.0)
VLDL: 23.4 mg/dL (ref 0.0–40.0)

## 2011-04-01 LAB — HEMOGLOBIN A1C: Hgb A1c MFr Bld: 6.3 % (ref 4.6–6.5)

## 2011-04-01 MED ORDER — VARDENAFIL HCL 20 MG PO TABS: 20.0000 mg | ORAL_TABLET | ORAL | Status: AC | PRN

## 2011-04-01 MED ORDER — PANTOPRAZOLE SODIUM 40 MG PO TBEC: 40.0000 mg | DELAYED_RELEASE_TABLET | Freq: Every day | ORAL | Status: DC

## 2011-04-01 NOTE — Assessment & Plan Note (Signed)
Ok for levitra prn,  to f/u any worsening symptoms or concerns 

## 2011-04-01 NOTE — Progress Notes (Signed)
Subjective:    Patient ID: Sean Lozano, male    DOB: 06/03/1933, 77 y.o.   MRN: [id number]  HPI  Here to f/u; overall doing ok,  Pt denies chest pain, increased sob or doe, wheezing, orthopnea, PND, increased LE swelling, palpitations, dizziness or syncope.  Pt denies new neurological symptoms such as new headache, or facial or extremity weakness or numbness   Pt denies polydipsia, polyuria, or low sugar symptoms such as weakness or confusion improved with po intake.  Pt states overall good compliance with meds, trying to follow lower cholesterol, diabetic diet, wt overall down from 215 to 205, and now can walk steady on a home treadmill at 3.0 MPH level, with a burst for a few minutes up to 3.5 level without undue symptoms.  Also with worsening ED symtpoms over the past few yrs, and now wife beginning to complain.  Libido intact .  Denies worsening depressive symptoms, suicidal ideation, or panic.  Knows not to take ntg in the same 24 hrs as PDE5 med.  Denies worsening reflux, dysphagia, abd pain, n/v, bowel change or blood,  No other overt blood or brusing on the plavix/asa Past Medical History  Diagnosis Date  . CAD (coronary artery disease)     Catheterization, October 13 2010, severe disease, surgical team recommended PCI, staged PCI to the SVG to OM1 and 2,   . Hyperlipidemia     intolerance to Crestor Zocor Vytorin. He tolerates Pravachol...low HDL  . GERD (gastroesophageal reflux disease)   . Rosacea   . Prostatitis   . Depression   . Benign prostatic hypertrophy   . Asthma   . Paresthesias     successful surgery by Dr. Julio Sicks  . Arthritis   . Diabetes mellitus     type II-diet  . Diverticulosis   . Hx of CABG     1992  . Ejection fraction     EF 60%, echo, June, 2012  . ASTHMA 06/04/2007  . BARRETTS ESOPHAGUS 06/02/2007  . BENIGN PROSTATIC HYPERTROPHY 06/04/2007  . Bladder neck obstruction 09/29/2010  . DEPRESSION 06/04/2007  . FATIGUE 06/02/2007  . GERD 06/04/2007  .  HYPERLIPIDEMIA 06/02/2007  . SKIN LESION 06/02/2007   Past Surgical History  Procedure Date  . C-spine disc surgery 03/2006  . C4-5 surgery 01/2009  . Cardiac catheterization     stent placed  . Coronary artery bypass graft   . Appendectomy   . Tonsillectomy   . Coccyx fracture surgery 1997    reports that he has never smoked. He does not have any smokeless tobacco history on file. He reports that he drinks alcohol. He reports that he does not use illicit drugs. family history includes Aneurysm in an unspecified family member; Diabetes in his sister; Heart disease in his father; Hyperlipidemia in an unspecified family member; and Liver cancer in an unspecified family member. Allergies  Allergen Reactions  . Atenolol   . Codeine   . Ezetimibe-Simvastatin   . Rosuvastatin   . Simvastatin    Current Outpatient Prescriptions on File Prior to Visit  Medication Sig Dispense Refill  . albuterol (PROVENTIL HFA) 108 (90 BASE) MCG/ACT inhaler Inhale 2 puffs into the lungs every 6 (six) hours as needed.        Marland Kitchen aspirin (ASPIRIN EC) 81 MG EC tablet Take 81 mg by mouth daily.        . Cholecalciferol (VITAMIN D3) 2000 UNITS TABS Take 1 tablet by mouth daily.       Marland Kitchen  clopidogrel (PLAVIX) 75 MG tablet Take 1 tablet (75 mg total) by mouth daily.  30 tablet  4  . Fluticasone-Salmeterol (ADVAIR DISKUS) 250-50 MCG/DOSE AEPB Inhale 1 puff into the lungs 2 (two) times daily.  1 each  11  . glucose blood (ONE TOUCH ULTRA TEST) test strip Use as directed once daily. 250.00  100 each  12  . metoprolol succinate (TOPROL-XL) 25 MG 24 hr tablet Take 0.5 tablets (12.5 mg total) by mouth 2 (two) times daily.  30 tablet  4  . montelukast (SINGULAIR) 10 MG tablet Take 1 tablet (10 mg total) by mouth at bedtime.  90 tablet  3  . mupirocin (BACTROBAN) 2 % cream as needed.        . niacin (NIASPAN) 1000 MG CR tablet Take 1 tablet (1,000 mg total) by mouth at bedtime.  30 tablet  4  . NITROSTAT 0.4 MG SL tablet  PLACE 1 TABLET WITH THE UNSET OF CHEST PAIN UNDER TONGUE EVERY 5 MINUTES UP TO 3 DOSES. IF PAIN STILL PERSIST CALL EMERGENCY PERSONAL.      Marland Kitchen ONETOUCH DELICA LANCETS MISC Use as directed once daily 250.00  100 each  11  . pantoprazole (PROTONIX) 40 MG tablet Take 1 tablet (40 mg total) by mouth daily.  90 tablet  3  . pravastatin (PRAVACHOL) 40 MG tablet Take 1 tablet (40 mg total) by mouth daily.  30 tablet  4  . Tamsulosin HCl (FLOMAX) 0.4 MG CAPS TAKE 1 CAPSULE BY MOUTH EVERY DAY  30 capsule  11   Review of Systems Review of Systems  Constitutional: Negative for diaphoresis and unexpected weight change.  HENT: Negative for drooling and tinnitus.   Eyes: Negative for photophobia and visual disturbance.  Respiratory: Negative for choking and stridor.   Gastrointestinal: Negative for vomiting and blood in stool.  Genitourinary: Negative for hematuria and decreased urine volume.     Objective:   Physical Exam BP 102/68  Pulse 62  Temp(Src) 97.8 F (36.6 C) (Oral)  Ht 5\' 11"  (1.803 m)  Wt 205 lb 8 oz (93.214 kg)  BMI 28.66 kg/m2  SpO2 94% Physical Exam  VS noted Constitutional: Pt appears well-developed and well-nourished.  HENT: Head: Normocephalic.  Right Ear: External ear normal.  Left Ear: External ear normal.  Eyes: Conjunctivae and EOM are normal. Pupils are equal, round, and reactive to light.  Neck: Normal range of motion. Neck supple.  Cardiovascular: Normal rate and regular rhythm.   Pulmonary/Chest: Effort normal and breath sounds normal.  Abd:  Soft, NT, non-distended, + BS Neurological: Pt is alert. No cranial nerve deficit.  Skin: Skin is warm. No erythema.  Psychiatric: Pt behavior is normal. Thought content normal.      Assessment & Plan:

## 2011-04-01 NOTE — Assessment & Plan Note (Signed)
stable overall by hx and exam, most recent data reviewed with pt, and pt to continue medical treatment as before  Lab Results  Component Value Date   WBC 10.4 11/03/2010   HGB 16.5 11/03/2010   HCT 46.4 11/03/2010   PLT 171 11/03/2010   GLUCOSE 136* 11/03/2010   CHOL 146 09/29/2010   TRIG 186.0* 09/29/2010   HDL 30.30* 09/29/2010   LDLDIRECT 87.6 09/19/2008   LDLCALC 79 09/29/2010   ALT 22 09/29/2010   AST 23 09/29/2010   NA 139 11/03/2010   K 4.0 11/03/2010   CL 104 11/03/2010   CREATININE 1.01 11/03/2010   BUN 12 11/03/2010   CO2 25 11/03/2010   TSH 1.94 09/29/2010   PSA 1.30 09/29/2010   INR 1.0 10/29/2010   HGBA1C 7.0* 09/29/2010

## 2011-04-01 NOTE — Assessment & Plan Note (Signed)
stable overall by hx and exam, most recent data reviewed with pt, and pt to continue medical treatment as before  Lab Results  Component Value Date   LDLCALC 79 09/29/2010

## 2011-04-01 NOTE — Assessment & Plan Note (Signed)
stable overall by hx and exam, most recent data reviewed with pt, and pt to continue medical treatment as before For f/u labs  Lab Results  Component Value Date   HGBA1C 7.0* 09/29/2010

## 2011-04-01 NOTE — Patient Instructions (Addendum)
Please call your [employer] supplement insurance, and if the shingles shot is covered please call for nurse visit to have the shot Take all new medications as prescribed Continue all other medications as before Please go to LAB in the Basement for the blood and/or urine tests to be done today Please call the phone number [phone] (the [employer] System) for results of testing in 2-3 days;  When calling, simply dial the number, and when prompted enter the MRN number above (the Medical Record Number) and the # key, then the message should start. Please return in 6 months, or sooner if needed

## 2011-07-30 ENCOUNTER — Other Ambulatory Visit: Payer: Self-pay

## 2011-07-30 MED ORDER — METOPROLOL SUCCINATE ER 25 MG PO TB24: 12.5000 mg | ORAL_TABLET | Freq: Two times a day (BID) | ORAL | Status: DC

## 2011-09-09 ENCOUNTER — Encounter: Payer: Self-pay | Admitting: Cardiology

## 2011-09-09 ENCOUNTER — Ambulatory Visit (INDEPENDENT_AMBULATORY_CARE_PROVIDER_SITE_OTHER): Payer: Medicare Other | Admitting: Cardiology

## 2011-09-09 VITALS — BP 142/70 | HR 72 | Ht 71.0 in | Wt 218.0 lb

## 2011-09-09 DIAGNOSIS — E785 Hyperlipidemia, unspecified: Secondary | ICD-10-CM

## 2011-09-09 DIAGNOSIS — I2510 Atherosclerotic heart disease of native coronary artery without angina pectoris: Secondary | ICD-10-CM

## 2011-09-09 NOTE — Progress Notes (Signed)
HPI Patient has significant coronary disease. He had CABG in the past. He then had a multivessel staged intervention within the past year. He's had no return of chest pain or shortness of breath. He had been losing some weight but unfortunately has gained some back. He and I discussed this. Historically he did not tolerate many statins. However he does tolerate Pravachol. His last LDL was 78. His HDL is low. I spoken with him today about one of the research protocols looking into medication that may raise HDL. He will be talking with the research nurse today.  Allergies  Allergen Reactions  . Atenolol   . Codeine   . Ezetimibe-Simvastatin   . Rosuvastatin   . Simvastatin     Current Outpatient Prescriptions  Medication Sig Dispense Refill  . albuterol (PROVENTIL HFA) 108 (90 BASE) MCG/ACT inhaler Inhale 2 puffs into the lungs every 6 (six) hours as needed.        Marland Kitchen aspirin (ASPIRIN EC) 81 MG EC tablet Take 81 mg by mouth daily.        . Cholecalciferol (VITAMIN D3) 2000 UNITS TABS Take 1 tablet by mouth daily.       . clopidogrel (PLAVIX) 75 MG tablet Take 1 tablet (75 mg total) by mouth daily.  30 tablet  4  . Fluticasone-Salmeterol (ADVAIR DISKUS) 250-50 MCG/DOSE AEPB Inhale 1 puff into the lungs 2 (two) times daily.  1 each  11  . glucose blood (ONE TOUCH ULTRA TEST) test strip Use as directed once daily. 250.00  100 each  12  . metoprolol succinate (TOPROL-XL) 25 MG 24 hr tablet Take 0.5 tablets (12.5 mg total) by mouth 2 (two) times daily.  30 tablet  4  . montelukast (SINGULAIR) 10 MG tablet Take 1 tablet (10 mg total) by mouth at bedtime.  90 tablet  3  . niacin (NIASPAN) 1000 MG CR tablet Take 1 tablet (1,000 mg total) by mouth at bedtime.  30 tablet  4  . NITROSTAT 0.4 MG SL tablet PLACE 1 TABLET WITH THE UNSET OF CHEST PAIN UNDER TONGUE EVERY 5 MINUTES UP TO 3 DOSES. IF PAIN STILL PERSIST CALL EMERGENCY PERSONAL.      Marland Kitchen ONETOUCH DELICA LANCETS MISC Use as directed once daily  250.00  100 each  11  . pantoprazole (PROTONIX) 40 MG tablet Take 1 tablet (40 mg total) by mouth daily.  90 tablet  3  . pravastatin (PRAVACHOL) 40 MG tablet Take 1 tablet (40 mg total) by mouth daily.  30 tablet  4  . Tamsulosin HCl (FLOMAX) 0.4 MG CAPS TAKE 1 CAPSULE BY MOUTH EVERY DAY  30 capsule  11  . DISCONTD: albuterol (PROVENTIL HFA;VENTOLIN HFA) 108 (90 BASE) MCG/ACT inhaler Inhale 2 puffs into the lungs every 6 (six) hours as needed for wheezing.  1 Inhaler  5    History   Social History  . Marital Status: Married    Spouse Name: N/A    Number of Children: 3  . Years of Education: N/A   Occupational History  . retired-real estate    Social History Main Topics  . Smoking status: Never Smoker   . Smokeless tobacco: Not on file  . Alcohol Use: Yes  . Drug Use: No  . Sexually Active: Not on file   Other Topics Concern  . Not on file   Social History Narrative   Pt has 51yo son-paranoid schizophrenic, unempolyed, lives with him.    Family History  Problem Relation  Age of Onset  . Heart disease Father     mother  . Aneurysm    . Hyperlipidemia    . Liver cancer      grandfather  . Diabetes Sister     mother    Past Medical History  Diagnosis Date  . CAD (coronary artery disease)     Catheterization, October 13 2010, severe disease, surgical team recommended PCI, staged PCI to the SVG to OM1 and 2,   . Hyperlipidemia     intolerance to Crestor Zocor Vytorin. He tolerates Pravachol...low HDL  . GERD (gastroesophageal reflux disease)   . Rosacea   . Prostatitis   . Depression   . Benign prostatic hypertrophy   . Asthma   . Paresthesias     successful surgery by Dr. Julio Sicks  . Arthritis   . Diabetes mellitus     type II-diet  . Diverticulosis   . Hx of CABG     1992  . Ejection fraction     EF 60%, echo, June, 2012  . ASTHMA 06/04/2007  . BARRETTS ESOPHAGUS 06/02/2007  . BENIGN PROSTATIC HYPERTROPHY 06/04/2007  . Bladder neck obstruction 09/29/2010  .  DEPRESSION 06/04/2007  . FATIGUE 06/02/2007  . GERD 06/04/2007  . HYPERLIPIDEMIA 06/02/2007  . SKIN LESION 06/02/2007    Past Surgical History  Procedure Date  . C-spine disc surgery 03/2006  . C4-5 surgery 01/2009  . Cardiac catheterization     stent placed  . Coronary artery bypass graft   . Appendectomy   . Tonsillectomy   . Coccyx fracture surgery 1997    ROS  Patient denies fever, chills, headache, sweats, rash, change in vision, change in hearing, chest pain, shortness of breath, nausea vomiting, urinary symptoms. All other systems are reviewed and are negative.  PHYSICAL EXAM Patient is oriented to person time and place. Affect is normal. Head is atraumatic. There is no jugulovenous distention. Lungs are clear. Respiratory effort is nonlabored. Cardiac exam reveals S1 and S2. There no clicks or significant murmurs. There is no peripheral edema. His abdomen is soft.  Filed Vitals:   09/09/11 0928  BP: 142/70  Pulse: 72  Height: 5\' 11"  (1.803 m)  Weight: 218 lb (98.884 kg)   EKG is done today and reviewed by me. He has sinus rhythm. There are no acute changes. Is no change from the past.  ASSESSMENT & PLAN

## 2011-09-09 NOTE — Assessment & Plan Note (Signed)
The patient has not tolerated some statins in the past. However he tolerates Pravachol. He'll be talking with the research nurses today about the possibility of enrolling in a study with a medication to help bradycardia HDL.

## 2011-09-09 NOTE — Patient Instructions (Signed)
Your physician wants you to follow-up in:  6 months. You will receive a reminder letter in the mail two months in advance. If you don't receive a letter, please call our office to schedule the follow-up appointment.   

## 2011-09-09 NOTE — Assessment & Plan Note (Signed)
Coronary disease remains stable. No change in therapy. I've encouraged him to return to his exercise. He will continue to try to lose some weight. We'll see him back in 6 months.

## 2011-09-15 ENCOUNTER — Other Ambulatory Visit: Payer: Self-pay | Admitting: Cardiology

## 2011-09-29 ENCOUNTER — Other Ambulatory Visit: Payer: Self-pay | Admitting: Internal Medicine

## 2011-10-15 ENCOUNTER — Other Ambulatory Visit: Payer: Self-pay | Admitting: Cardiology

## 2011-10-25 ENCOUNTER — Other Ambulatory Visit: Payer: Self-pay | Admitting: Internal Medicine

## 2011-11-04 ENCOUNTER — Other Ambulatory Visit: Payer: Self-pay | Admitting: Neurosurgery

## 2011-11-04 DIAGNOSIS — M4802 Spinal stenosis, cervical region: Secondary | ICD-10-CM

## 2011-11-06 ENCOUNTER — Ambulatory Visit
Admission: RE | Admit: 2011-11-06 | Discharge: 2011-11-06 | Disposition: A | Discharge status: Home / Self Care | Payer: Medicare Other | Source: Ambulatory Visit | Attending: Neurosurgery | Admitting: Neurosurgery

## 2011-11-06 DIAGNOSIS — M4802 Spinal stenosis, cervical region: Secondary | ICD-10-CM

## 2011-11-15 ENCOUNTER — Other Ambulatory Visit: Payer: Self-pay | Admitting: Cardiology

## 2011-11-15 NOTE — Telephone Encounter (Signed)
Fax Received. Refill Completed. Marionette Meskill Chowoe (R.M.A)   

## 2011-12-07 ENCOUNTER — Telehealth: Payer: Self-pay | Admitting: Cardiology

## 2011-12-07 NOTE — Telephone Encounter (Signed)
Pt is scheduled for an epidural injection on 12/22/11.  Dr Murray Hodgkins wants him to stop his Plavix and ASA (81mg ) 7 days prior to the injection.

## 2011-12-07 NOTE — Telephone Encounter (Signed)
Ok to stop Plavix and ASA needs to be faxed to [phone].

## 2011-12-07 NOTE — Telephone Encounter (Signed)
Please return call to patient [phone] regarding epidural injection scheduled for 9/11 w/ Dr. Murray Hodgkins

## 2011-12-09 NOTE — Telephone Encounter (Signed)
Message given to Dr Antoine Poche (DOD) for advice regarding stopping Plavix and asa.

## 2011-12-10 NOTE — Telephone Encounter (Signed)
I reviewed the patient's history including his last cardiac catheterization report. I remember the patient from his last procedure. He needs to hold aspirin and Plavix for 7 days in order to have an epidural injection. He is in a lot of pain. Ideally, he would remain on low-dose aspirin to keep the risk of cardiac events low, but this is not possible with epidural procedures. He is at some increased risk of a cardiac event off of both aspirin and Plavix, but this risk should be minimized with the short duration of holding those drugs. I would like him to go back on aspirin and Plavix as soon as possible after the procedure. The patient and his wife both understand the potential risk, but overall this seems necessary and again I think the risk of holding these medications for a short period of time should be low.

## 2011-12-10 NOTE — Telephone Encounter (Signed)
Will forward to Dr Cooper for review.   

## 2011-12-14 NOTE — Telephone Encounter (Signed)
This note was faxed to Dr Chari Manning office.

## 2011-12-20 ENCOUNTER — Emergency Department (HOSPITAL_COMMUNITY): Payer: Medicare Other

## 2011-12-20 ENCOUNTER — Encounter (HOSPITAL_COMMUNITY): Payer: Self-pay

## 2011-12-20 ENCOUNTER — Inpatient Hospital Stay (HOSPITAL_COMMUNITY)
Admission: EM | Admit: 2011-12-20 | Discharge: 2011-12-22 | DRG: 251 | Disposition: A | Discharge status: Home / Self Care | Payer: Medicare Other | Attending: Cardiology | Admitting: Cardiology

## 2011-12-20 DIAGNOSIS — M5090 Cervical disc disorder, unspecified, unspecified cervical region: Secondary | ICD-10-CM | POA: Diagnosis present

## 2011-12-20 DIAGNOSIS — I10 Essential (primary) hypertension: Secondary | ICD-10-CM | POA: Diagnosis present

## 2011-12-20 DIAGNOSIS — Z9861 Coronary angioplasty status: Secondary | ICD-10-CM

## 2011-12-20 DIAGNOSIS — E119 Type 2 diabetes mellitus without complications: Secondary | ICD-10-CM | POA: Diagnosis present

## 2011-12-20 DIAGNOSIS — M129 Arthropathy, unspecified: Secondary | ICD-10-CM | POA: Diagnosis present

## 2011-12-20 DIAGNOSIS — I214 Non-ST elevation (NSTEMI) myocardial infarction: Principal | ICD-10-CM | POA: Diagnosis present

## 2011-12-20 DIAGNOSIS — I498 Other specified cardiac arrhythmias: Secondary | ICD-10-CM | POA: Diagnosis present

## 2011-12-20 DIAGNOSIS — R079 Chest pain, unspecified: Secondary | ICD-10-CM

## 2011-12-20 DIAGNOSIS — I440 Atrioventricular block, first degree: Secondary | ICD-10-CM | POA: Diagnosis present

## 2011-12-20 DIAGNOSIS — J45909 Unspecified asthma, uncomplicated: Secondary | ICD-10-CM | POA: Diagnosis present

## 2011-12-20 DIAGNOSIS — E785 Hyperlipidemia, unspecified: Secondary | ICD-10-CM | POA: Diagnosis present

## 2011-12-20 DIAGNOSIS — Z79899 Other long term (current) drug therapy: Secondary | ICD-10-CM

## 2011-12-20 DIAGNOSIS — I2510 Atherosclerotic heart disease of native coronary artery without angina pectoris: Secondary | ICD-10-CM | POA: Diagnosis present

## 2011-12-20 DIAGNOSIS — I2582 Chronic total occlusion of coronary artery: Secondary | ICD-10-CM | POA: Diagnosis present

## 2011-12-20 DIAGNOSIS — R778 Other specified abnormalities of plasma proteins: Secondary | ICD-10-CM

## 2011-12-20 DIAGNOSIS — R0600 Dyspnea, unspecified: Secondary | ICD-10-CM

## 2011-12-20 DIAGNOSIS — F329 Major depressive disorder, single episode, unspecified: Secondary | ICD-10-CM | POA: Diagnosis present

## 2011-12-20 DIAGNOSIS — R0602 Shortness of breath: Secondary | ICD-10-CM

## 2011-12-20 DIAGNOSIS — I200 Unstable angina: Secondary | ICD-10-CM

## 2011-12-20 DIAGNOSIS — Y849 Medical procedure, unspecified as the cause of abnormal reaction of the patient, or of later complication, without mention of misadventure at the time of the procedure: Secondary | ICD-10-CM | POA: Diagnosis present

## 2011-12-20 DIAGNOSIS — D72829 Elevated white blood cell count, unspecified: Secondary | ICD-10-CM

## 2011-12-20 DIAGNOSIS — Z7982 Long term (current) use of aspirin: Secondary | ICD-10-CM

## 2011-12-20 DIAGNOSIS — Z7902 Long term (current) use of antithrombotics/antiplatelets: Secondary | ICD-10-CM

## 2011-12-20 DIAGNOSIS — F3289 Other specified depressive episodes: Secondary | ICD-10-CM | POA: Diagnosis present

## 2011-12-20 DIAGNOSIS — I25810 Atherosclerosis of coronary artery bypass graft(s) without angina pectoris: Secondary | ICD-10-CM | POA: Diagnosis present

## 2011-12-20 DIAGNOSIS — T82897A Other specified complication of cardiac prosthetic devices, implants and grafts, initial encounter: Secondary | ICD-10-CM | POA: Diagnosis present

## 2011-12-20 DIAGNOSIS — N400 Enlarged prostate without lower urinary tract symptoms: Secondary | ICD-10-CM | POA: Diagnosis present

## 2011-12-20 DIAGNOSIS — K219 Gastro-esophageal reflux disease without esophagitis: Secondary | ICD-10-CM | POA: Diagnosis present

## 2011-12-20 HISTORY — DX: Atrioventricular block, first degree: I44.0

## 2011-12-20 HISTORY — DX: Cervical disc disorder, unspecified, unspecified cervical region: M50.90

## 2011-12-20 HISTORY — DX: Bradycardia, unspecified: R00.1

## 2011-12-20 LAB — CBC WITH DIFFERENTIAL/PLATELET
Basophils Absolute: 0 10*3/uL (ref 0.0–0.1)
Basophils Relative: 0 % (ref 0–1)
Eosinophils Absolute: 0.3 10*3/uL (ref 0.0–0.7)
Eosinophils Relative: 3 % (ref 0–5)
HCT: 45.9 % (ref 39.0–52.0)
Hemoglobin: 16.2 g/dL (ref 13.0–17.0)
Lymphocytes Relative: 28 % (ref 12–46)
Lymphs Abs: 3.2 10*3/uL (ref 0.7–4.0)
MCH: 33.9 pg (ref 26.0–34.0)
MCHC: 35.3 g/dL (ref 30.0–36.0)
MCV: 96 fL (ref 78.0–100.0)
Monocytes Absolute: 0.8 10*3/uL (ref 0.1–1.0)
Monocytes Relative: 7 % (ref 3–12)
Neutro Abs: 7.3 10*3/uL (ref 1.7–7.7)
Neutrophils Relative %: 63 % (ref 43–77)
Platelets: 166 10*3/uL (ref 150–400)
RBC: 4.78 MIL/uL (ref 4.22–5.81)
RDW: 12.7 % (ref 11.5–15.5)
WBC: 11.6 10*3/uL — ABNORMAL HIGH (ref 4.0–10.5)

## 2011-12-20 LAB — COMPREHENSIVE METABOLIC PANEL
ALT: 28 U/L (ref 0–53)
AST: 29 U/L (ref 0–37)
Albumin: 4.1 g/dL (ref 3.5–5.2)
Alkaline Phosphatase: 60 U/L (ref 39–117)
BUN: 12 mg/dL (ref 6–23)
CO2: 26 mEq/L (ref 19–32)
Calcium: 9.6 mg/dL (ref 8.4–10.5)
Chloride: 99 mEq/L (ref 96–112)
Creatinine, Ser: 1.08 mg/dL (ref 0.50–1.35)
GFR calc Af Amer: 75 mL/min — ABNORMAL LOW (ref >90)
GFR calc non Af Amer: 65 mL/min — ABNORMAL LOW (ref >90)
Glucose, Bld: 115 mg/dL — ABNORMAL HIGH (ref 70–99)
Potassium: 4.5 mEq/L (ref 3.5–5.1)
Sodium: 135 mEq/L (ref 135–145)
Total Bilirubin: 0.4 mg/dL (ref 0.3–1.2)
Total Protein: 7.2 g/dL (ref 6.0–8.3)

## 2011-12-20 LAB — TROPONIN I: Troponin I: 0.74 ng/mL (ref <0.30)

## 2011-12-20 LAB — PROTIME-INR
INR: 1.16 (ref 0.00–1.49)
Prothrombin Time: 15 seconds (ref 11.6–15.2)

## 2011-12-20 LAB — POCT I-STAT TROPONIN I: Troponin i, poc: 0.16 ng/mL (ref 0.00–0.08)

## 2011-12-20 LAB — GLUCOSE, CAPILLARY: Glucose-Capillary: 123 mg/dL — ABNORMAL HIGH (ref 70–99)

## 2011-12-20 MED ORDER — MONTELUKAST SODIUM 10 MG PO TABS
10.0000 mg | ORAL_TABLET | Freq: Every day | ORAL | Status: DC
  Administered 2011-12-20 – 2011-12-21 (×2): 10 mg via ORAL
  Filled 2011-12-20 (×3): qty 1

## 2011-12-20 MED ORDER — NON FORMULARY: 40.0000 mg | Freq: Every day | Status: DC

## 2011-12-20 MED ORDER — ALBUTEROL SULFATE HFA 108 (90 BASE) MCG/ACT IN AERS
2.0000 | INHALATION_SPRAY | Freq: Four times a day (QID) | RESPIRATORY_TRACT | Status: DC | PRN
  Filled 2011-12-20: qty 6.7

## 2011-12-20 MED ORDER — ASPIRIN EC 81 MG PO TBEC
81.0000 mg | DELAYED_RELEASE_TABLET | Freq: Every day | ORAL | Status: DC
  Administered 2011-12-22: 81 mg via ORAL
  Filled 2011-12-20 (×2): qty 1

## 2011-12-20 MED ORDER — NITROGLYCERIN 0.4 MG SL SUBL: 0.4000 mg | SUBLINGUAL_TABLET | SUBLINGUAL | Status: DC | PRN

## 2011-12-20 MED ORDER — ACETAMINOPHEN 325 MG PO TABS: 650.0000 mg | ORAL_TABLET | ORAL | Status: DC | PRN

## 2011-12-20 MED ORDER — CLOPIDOGREL BISULFATE 75 MG PO TABS
75.0000 mg | ORAL_TABLET | Freq: Every day | ORAL | Status: DC
  Administered 2011-12-21 – 2011-12-22 (×2): 75 mg via ORAL
  Filled 2011-12-20 (×2): qty 1

## 2011-12-20 MED ORDER — HYDROCODONE-ACETAMINOPHEN 5-325 MG PO TABS
0.5000 | ORAL_TABLET | Freq: Four times a day (QID) | ORAL | Status: DC | PRN
  Administered 2011-12-20 – 2011-12-21 (×2): 1 via ORAL
  Filled 2011-12-20 (×2): qty 1

## 2011-12-20 MED ORDER — PANTOPRAZOLE SODIUM 40 MG PO TBEC
40.0000 mg | DELAYED_RELEASE_TABLET | Freq: Every day | ORAL | Status: DC
  Administered 2011-12-21 – 2011-12-22 (×2): 40 mg via ORAL
  Filled 2011-12-20 (×3): qty 1

## 2011-12-20 MED ORDER — SODIUM CHLORIDE 0.9 % IJ SOLN: 3.0000 mL | Freq: Two times a day (BID) | INTRAMUSCULAR | Status: DC

## 2011-12-20 MED ORDER — TAMSULOSIN HCL 0.4 MG PO CAPS
0.4000 mg | ORAL_CAPSULE | Freq: Every day | ORAL | Status: DC
  Administered 2011-12-20 – 2011-12-21 (×2): 0.4 mg via ORAL
  Filled 2011-12-20 (×3): qty 1

## 2011-12-20 MED ORDER — METOPROLOL SUCCINATE 12.5 MG HALF TABLET
12.5000 mg | ORAL_TABLET | Freq: Two times a day (BID) | ORAL | Status: DC
  Administered 2011-12-20 – 2011-12-22 (×4): 12.5 mg via ORAL
  Filled 2011-12-20 (×5): qty 1

## 2011-12-20 MED ORDER — FLUTICASONE-SALMETEROL 250-50 MCG/DOSE IN AEPB
1.0000 | INHALATION_SPRAY | Freq: Two times a day (BID) | RESPIRATORY_TRACT | Status: DC
  Administered 2011-12-20 – 2011-12-22 (×4): 1 via RESPIRATORY_TRACT
  Filled 2011-12-20: qty 14

## 2011-12-20 MED ORDER — HEPARIN (PORCINE) IN NACL 100-0.45 UNIT/ML-% IJ SOLN
1300.0000 [IU]/h | INTRAMUSCULAR | Status: DC
  Administered 2011-12-20 (×2): 1300 [IU]/h via INTRAVENOUS
  Filled 2011-12-20 (×2): qty 250

## 2011-12-20 MED ORDER — CARISOPRODOL 350 MG PO TABS: 350.0000 mg | ORAL_TABLET | Freq: Four times a day (QID) | ORAL | Status: DC | PRN

## 2011-12-20 MED ORDER — VITAMIN D3 50 MCG (2000 UT) PO TABS: 1.0000 | ORAL_TABLET | Freq: Every day | ORAL | Status: DC

## 2011-12-20 MED ORDER — VITAMIN D3 25 MCG (1000 UNIT) PO TABS
2000.0000 [IU] | ORAL_TABLET | Freq: Every day | ORAL | Status: DC
  Administered 2011-12-21 – 2011-12-22 (×2): 2000 [IU] via ORAL
  Filled 2011-12-20 (×2): qty 2

## 2011-12-20 MED ORDER — NIACIN ER (ANTIHYPERLIPIDEMIC) 1000 MG PO TBCR: 1000.0000 mg | EXTENDED_RELEASE_TABLET | Freq: Every day | ORAL | Status: DC

## 2011-12-20 MED ORDER — NITROGLYCERIN IN D5W 200-5 MCG/ML-% IV SOLN
10.0000 ug/min | INTRAVENOUS | Status: DC
  Administered 2011-12-20: 10 ug/min via INTRAVENOUS
  Filled 2011-12-20: qty 250

## 2011-12-20 MED ORDER — SODIUM CHLORIDE 0.9 % IV SOLN: 250.0000 mL | INTRAVENOUS | Status: DC | PRN

## 2011-12-20 MED ORDER — ONDANSETRON HCL 4 MG/2ML IJ SOLN: 4.0000 mg | Freq: Four times a day (QID) | INTRAMUSCULAR | Status: DC | PRN

## 2011-12-20 MED ORDER — SODIUM CHLORIDE 0.9 % IJ SOLN: 3.0000 mL | INTRAMUSCULAR | Status: DC | PRN

## 2011-12-20 MED ORDER — PRAVASTATIN SODIUM 40 MG PO TABS
40.0000 mg | ORAL_TABLET | Freq: Every day | ORAL | Status: DC
  Administered 2011-12-20 – 2011-12-21 (×2): 40 mg via ORAL
  Filled 2011-12-20 (×3): qty 1

## 2011-12-20 MED ORDER — NITROGLYCERIN IN D5W 200-5 MCG/ML-% IV SOLN
5.0000 ug/min | INTRAVENOUS | Status: DC
  Administered 2011-12-20: 10 ug/min via INTRAVENOUS

## 2011-12-20 MED ORDER — ASPIRIN 81 MG PO TBEC: 81.0000 mg | DELAYED_RELEASE_TABLET | Freq: Every day | ORAL | Status: DC

## 2011-12-20 MED ORDER — HEPARIN BOLUS VIA INFUSION
4000.0000 [IU] | Freq: Once | INTRAVENOUS | Status: AC
  Administered 2011-12-20: 4000 [IU] via INTRAVENOUS

## 2011-12-20 MED ORDER — SODIUM CHLORIDE 0.9 % IV SOLN: INTRAVENOUS | Status: DC

## 2011-12-20 MED ORDER — INSULIN ASPART 100 UNIT/ML ~~LOC~~ SOLN
0.0000 [IU] | SUBCUTANEOUS | Status: DC
  Administered 2011-12-21: 2 [IU] via SUBCUTANEOUS
  Administered 2011-12-22: 08:00:00 1 [IU] via SUBCUTANEOUS

## 2011-12-20 MED ORDER — ASPIRIN 81 MG PO CHEW
324.0000 mg | CHEWABLE_TABLET | ORAL | Status: AC
  Administered 2011-12-21: 06:00:00 324 mg via ORAL
  Filled 2011-12-20: qty 4

## 2011-12-20 MED ORDER — HEPARIN (PORCINE) IN NACL 100-0.45 UNIT/ML-% IJ SOLN
1300.0000 [IU]/h | Freq: Once | INTRAMUSCULAR | Status: DC
  Filled 2011-12-20: qty 250

## 2011-12-20 MED ORDER — CLOPIDOGREL BISULFATE 300 MG PO TABS
300.0000 mg | ORAL_TABLET | Freq: Once | ORAL | Status: AC
  Administered 2011-12-20: 300 mg via ORAL
  Filled 2011-12-20: qty 1

## 2011-12-20 MED ORDER — NIACIN ER 500 MG PO CPCR
1000.0000 mg | ORAL_CAPSULE | Freq: Every day | ORAL | Status: DC
  Filled 2011-12-20 (×3): qty 2

## 2011-12-20 MED ORDER — HEPARIN SODIUM (PORCINE) 1000 UNIT/ML IJ SOLN: 4000.0000 [IU] | Freq: Once | INTRAMUSCULAR | Status: DC

## 2011-12-20 NOTE — ED Provider Notes (Signed)
History     CSN: [id number]  Arrival date & time 12/20/11  1727   First MD Initiated Contact with Patient 12/20/11 1738      Chief Complaint  Patient presents with  . Chest Pain    (Consider location/radiation/quality/duration/timing/severity/associated sxs/prior treatment) Patient is a 78 y.o. male presenting with chest pain. The history is provided by the patient and medical records. No language interpreter was used.  Chest Pain The chest pain began 1 - 2 hours ago. Chest pain occurs constantly. The chest pain is resolved. At its most intense, the pain is at 10/10. The pain is currently at 0/10. The severity of the pain is severe. The quality of the pain is described as heavy. The pain radiates to the left arm and right arm. Chest pain is worsened by exertion. Primary symptoms include shortness of breath. Pertinent negatives for primary symptoms include no fever, no fatigue, no syncope, no palpitations, no nausea and no dizziness.  The shortness of breath began today. The shortness of breath developed suddenly. The shortness of breath is moderate.  Pertinent negatives for associated symptoms include no diaphoresis and no lower extremity edema. He tried nitroglycerin and aspirin for the symptoms. Risk factors include alcohol intake, being elderly, lack of exercise and male gender.  His past medical history is significant for CAD, diabetes and hypertension.  His family medical history is significant for CAD in family.  Procedure history is positive for cardiac catheterization, echocardiogram and exercise treadmill test.     Past Medical History  Diagnosis Date  . CAD (coronary artery disease)     Catheterization, October 13 2010, severe disease, surgical team recommended PCI, staged PCI to the SVG to OM1 and 2,   . Hyperlipidemia     intolerance to Crestor Zocor Vytorin. He tolerates Pravachol...low HDL  . GERD (gastroesophageal reflux disease)   . Rosacea   . Prostatitis   . Depression    . Benign prostatic hypertrophy   . Asthma   . Paresthesias     successful surgery by Dr. Julio Sicks  . Arthritis   . Diabetes mellitus     type II-diet  . Diverticulosis   . Hx of CABG     1992  . Ejection fraction     EF 60%, echo, June, 2012  . ASTHMA 06/04/2007  . BARRETTS ESOPHAGUS 06/02/2007  . BENIGN PROSTATIC HYPERTROPHY 06/04/2007  . Bladder neck obstruction 09/29/2010  . DEPRESSION 06/04/2007  . FATIGUE 06/02/2007  . GERD 06/04/2007  . HYPERLIPIDEMIA 06/02/2007  . SKIN LESION 06/02/2007    Past Surgical History  Procedure Date  . C-spine disc surgery 03/2006  . C4-5 surgery 01/2009  . Cardiac catheterization     stent placed  . Coronary artery bypass graft   . Appendectomy   . Tonsillectomy   . Coccyx fracture surgery 1997    Family History  Problem Relation Age of Onset  . Heart disease Father     mother  . Aneurysm    . Hyperlipidemia    . Liver cancer      grandfather  . Diabetes Sister     mother    History  Substance Use Topics  . Smoking status: Never Smoker   . Smokeless tobacco: Not on file  . Alcohol Use: Yes      Review of Systems  Constitutional: Negative for fever, diaphoresis and fatigue.  Respiratory: Positive for shortness of breath.   Cardiovascular: Positive for chest pain. Negative for palpitations and syncope.  Gastrointestinal: Negative for nausea.  Neurological: Negative for dizziness.  All other systems reviewed and are negative.    Allergies  Atenolol; Codeine; Ezetimibe-simvastatin; Rosuvastatin; and Simvastatin  Home Medications   Current Outpatient Rx  Name Route Sig Dispense Refill  . ALBUTEROL SULFATE HFA 108 (90 BASE) MCG/ACT IN AERS Inhalation Inhale 2 puffs into the lungs every 6 (six) hours as needed.      . ASPIRIN 81 MG PO TBEC Oral Take 81 mg by mouth daily.      Marland Kitchen VITAMIN D3 2000 UNITS PO TABS Oral Take 1 tablet by mouth daily.     Marland Kitchen CLOPIDOGREL BISULFATE 75 MG PO TABS  TAKE 1 TABLET (75 MG TOTAL) BY  MOUTH DAILY. 30 tablet 4  . FLUTICASONE-SALMETEROL 250-50 MCG/DOSE IN AEPB Inhalation Inhale 1 puff into the lungs 2 (two) times daily. 1 each 11  . GLUCOSE BLOOD VI STRP  Use as directed once daily. 250.00 100 each 12  . METOPROLOL SUCCINATE ER 25 MG PO TB24 Oral Take 0.5 tablets (12.5 mg total) by mouth 2 (two) times daily. 30 tablet 4  . MONTELUKAST SODIUM 10 MG PO TABS  TAKE 1 TABLET BY MOUTH AT BEDTIME 90 tablet 1  . NIACIN ER (ANTIHYPERLIPIDEMIC) 1000 MG PO TBCR Oral Take 1 tablet (1,000 mg total) by mouth at bedtime. 30 tablet 4  . NIASPAN 1000 MG PO TBCR  TAKE 1 TABLET BY MOUTH AT BEDTIME AS DIRECTED.TAKE 1 ASPIRIN 1/2 HR PRIOR TO NIASPAN 30 tablet 10  . NITROSTAT 0.4 MG SL SUBL  TAKE 1 TABLET EVERY 5 MINUTES FOR UP TO 3 DOSES 25 tablet 5  . ONETOUCH DELICA LANCETS MISC  Use as directed once daily 250.00 100 each 11  . PANTOPRAZOLE SODIUM 40 MG PO TBEC Oral Take 1 tablet (40 mg total) by mouth daily. 90 tablet 3  . PRAVASTATIN SODIUM 40 MG PO TABS Oral Take 1 tablet (40 mg total) by mouth daily. 30 tablet 4  . PRAVASTATIN SODIUM 40 MG PO TABS  TAKE 1 TABLET (40 MG TOTAL) BY MOUTH DAILY. 30 tablet 9  . TAMSULOSIN HCL 0.4 MG PO CAPS  TAKE 1 CAPSULE BY MOUTH EVERY DAY 30 capsule 6    BP 161/75  Pulse 56  Temp 98.1 F (36.7 C) (Oral)  Resp 14  SpO2 98%  Physical Exam  Nursing note and vitals reviewed. Constitutional: He is oriented to person, place, and time. He appears well-developed and well-nourished. No distress.  HENT:  Head: Normocephalic and atraumatic.  Right Ear: External ear normal.  Left Ear: External ear normal.  Nose: Nose normal.  Mouth/Throat: Oropharynx is clear and moist.  Eyes: Conjunctivae and EOM are normal. Pupils are equal, round, and reactive to light.  Neck: Normal range of motion. Neck supple.  Cardiovascular: Normal rate, regular rhythm, normal heart sounds and intact distal pulses.   Pulmonary/Chest: Effort normal and breath sounds normal.  Abdominal:  Soft. Bowel sounds are normal.  Musculoskeletal: Normal range of motion. He exhibits no edema and no tenderness.  Neurological: He is alert and oriented to person, place, and time. He has normal reflexes.  Skin: Skin is warm and dry.  Psychiatric: He has a normal mood and affect.    ED Course  Procedures (including critical care time)  Labs Reviewed  CBC WITH DIFFERENTIAL - Abnormal; Notable for the following:    WBC 11.6 (*)     All other components within normal limits  COMPREHENSIVE METABOLIC PANEL - Abnormal; Notable  for the following:    Glucose, Bld 115 (*)     GFR calc non Af Amer 65 (*)     GFR calc Af Amer 75 (*)     All other components within normal limits  POCT I-STAT TROPONIN I - Abnormal; Notable for the following:    Troponin i, poc 0.16 (*)     All other components within normal limits  GLUCOSE, CAPILLARY - Abnormal; Notable for the following:    Glucose-Capillary 123 (*)     All other components within normal limits  TROPONIN I - Abnormal; Notable for the following:    Troponin I 0.74 (*)     All other components within normal limits  PROTIME-INR  HEPARIN LEVEL (UNFRACTIONATED)  CBC  TROPONIN I  TROPONIN I  BASIC METABOLIC PANEL  LIPID PANEL   Dg Chest 2 View  12/20/2011  *RADIOLOGY REPORT*  Clinical Data: Chest pain, diabetes, asthma  CHEST - 2 VIEW  Comparison: Portable chest x-ray of 10/13/2010  Findings: There is mild left basilar linear atelectasis and/or scarring present.  No active infiltrate or effusion is seen.  The heart is mildly enlarged and stable.  Median sternotomy sutures are noted.  There are degenerative changes diffusely throughout the thoracic spine.  IMPRESSION: No active lung disease.  Mild linear atelectasis and/or scarring at the left lung base.   Original Report Authenticated By: Juline Patch, M.D.       Date: 12/20/2011  Rate: 56  Rhythm: sinus bradycardia  QRS Axis: normal  Intervals: normal and PR prolonged  ST/T Wave  abnormalities: normal  Conduction Disutrbances:first-degree A-V block   Narrative Interpretation:   Old EKG Reviewed: unchanged    1. Chest pain   2. Shortness of breath   3. Elevated troponin   4. Leukocytosis   5. Dyspnea       MDM    The patient is a 78 year old middle with past medical history relevant for known coronary artery disease , who presents with chest pain, and shortness of breath. Of note, patient given a total of 4 nitroglycerin and 324 mg aspirin, status post treatment symptoms resolved.  Upon arrival in the emergency department, patient noted to be afebrile and vital signs remarkable only for a pressure of 161/75.  On exam, patient with normal heart/lung sounds, no peripheral edema, no JVD. Given patient's underlying past medical history and presentation concern for ACS.  Thus EKG, chest x-ray, and labs completed. EKG showed first degree AV block, otherwise, unchanged from prior.  Review of labs shows mildly elevated troponin at 0.16 but otherwise unremarkable.  Cardiology consulted and recommendations included admission to step down unit.  Patient given plavix, nitro gtt, and heparin bolus/infusion per cardiology recommendations.  ASA 324 given per EMS and dose confirmed.  Patient moved to step down unit without acute events.          Johnney Ou, MD 12/20/11 [phone]

## 2011-12-20 NOTE — ED Notes (Signed)
Per ems- pt c/o sudden onset of squeezing/crushing cp rated 10/10. Pain radiated to left arm, associated with lightheadedness. Pt took 3 nitro, pain went down to 4/10, took 1 more nitro with EMS, pain currently 0/10. Denies sob, n/v, diaphoresis. 20g Iv right forearm. 1st degree heart block noted on 12 lead. Hr-64 Bp- 126/80 R-18 O2-100% on RA   Pt received 324asa en route.

## 2011-12-20 NOTE — ED Notes (Signed)
Pt currently denies any CP. States at 1600-1630 pt took 3 nitro but it wasn't until EMS gave pt 1nitro and 324asa that pain was relieved. Pain was diffuse across chest, radiated to both arms, described as "someone sitting on my chest." Pain was associated with SOB. Denies diaphoresis, n/v. Pt also having neck pain x64yr, is supposed to have epidural for pain.

## 2011-12-20 NOTE — Progress Notes (Signed)
ANTICOAGULATION CONSULT NOTE - Initial Consult  Pharmacy Consult:  Heparin Indication: ACS  Allergies  Allergen Reactions  . Atenolol Other (See Comments)    depression  . Codeine Other (See Comments)    Crazy feeling  . Ezetimibe-Simvastatin Other (See Comments)    Severe hip pain  . Rosuvastatin Other (See Comments)    Severe hip pain  . Simvastatin Other (See Comments)    Severe hip pain    Patient Measurements: Heparin Dosing Weight: 95kg  Vital Signs: Temp: 98.1 F (36.7 C) (09/09 1732) Temp src: Oral (09/09 1732) BP: 131/86 mmHg (09/09 2000) Pulse Rate: 63  (09/09 2000)  Labs:  Basename 12/20/11 1815  HGB 16.2  HCT 45.9  PLT 166  APTT --  LABPROT 15.0  INR 1.16  HEPARINUNFRC --  CREATININE 1.08  CKTOTAL --  CKMB --  TROPONINI --    The CrCl is unknown because both a height and weight (above a minimum accepted value) are required for this calculation.   Medical History: Past Medical History  Diagnosis Date  . CAD (coronary artery disease)     a. CABG x 6 in 1992. b. s/p DES x 2 to SVG -> OM1/OM2 in 09/2010. c. s/p DES to mid LAD 10/2010.  Marland Kitchen Hyperlipidemia     intolerance to Crestor Zocor Vytorin. He tolerates Pravachol...low HDL  . GERD (gastroesophageal reflux disease)   . Rosacea   . Prostatitis   . Depression   . Benign prostatic hypertrophy   . Asthma   . Paresthesias     successful surgery by Dr. Julio Sicks  . Arthritis   . Diabetes mellitus     type II-diet  . Diverticulosis   . Hx of CABG     1992  . Ejection fraction     EF 60%, echo, June, 2012  . BARRETTS ESOPHAGUS 06/02/2007  . Bladder neck obstruction 09/29/2010  . SKIN LESION 06/02/2007        Assessment: [name] admitted with chief complaint of sudden squeezing/crushing chest pain that improved with NTG.  Patient is now chest pain free with plan for cath in AM.  Heparin gtt already started and Pharmacy will continue to monitor.   Goal of Therapy:  Heparin level 0.3-0.7  units/ml Monitor platelets by anticoagulation protocol: Yes     Plan:  - Heparin 4000 units IV bolus, then - Heparin gtt at 1300 units/hr - Check 8 hr HL - Daily HL / CBC     Sean Lozano, PharmD, BCPS Pager:  [phone] 12/20/2011, 8:23 PM

## 2011-12-20 NOTE — H&P (Signed)
History and Physical  Patient ID: Sean Lozano MRN: [id number], DOB: 13-Jul-1933 Date of Encounter: 12/20/2011, 7:36 PM Primary Physician: Oliver Barre, MD Primary Cardiologist: Dr. Myrtis Ser  Chief Complaint: chest pain  HPI: [name] is a 78 y/o M with hx CAD s/p CABG x 6 in 1992 with last PCI 10/2010, HL, diet-controlled diabetes who presented to [facility] with complaints of chest pain. He was scheduled in the next week (9/11) to undergo epidural injection for cervical stenosis pain. He had last dose of Plavix on 9/3 and last dose of ASA on 9/2 which was held in prep for this procedure. He was at home this evening and developed chest pressure across his entire chest with radiation to his arms and shoulders that reminded him of his symptoms prior to his CABG. No SOB, diaphoresis, nausea, palpitations, LEE, orthopnea or syncope. No recent travel, trauma, or bedrest. He took 3 SL NTG at home with gradual but partial relief. He took 1/2 of a hydrocodone but that did not help. His wife called 911. He received 4 baby ASA and 1 additional SL with resolution of his pain. He remains chest pain free. WBC 11.6, initial troponin 0.16. CXR no acute dz. EKG nonacute. Looking back, he says he's had one other episode in the last two weeks where he had to take a SL NTG which relieved his pain. However, he and his wife remain active and walk occasionally at the mall without exertional symptoms.  Past Medical History  Diagnosis Date  . CAD (coronary artery disease)     a. CABG x 6 in 1992. b. s/p DES x 2 to SVG -> OM1/OM2 in 09/2010. c. s/p DES to mid LAD 10/2010.  Marland Kitchen Hyperlipidemia     intolerance to Crestor Zocor Vytorin. He tolerates Pravachol...low HDL  . GERD (gastroesophageal reflux disease)   . Rosacea   . Prostatitis   . Depression   . Benign prostatic hypertrophy   . Asthma   . Paresthesias     successful surgery by Dr. Julio Sicks  . Arthritis   . Diabetes mellitus     type II-diet  .  Diverticulosis   . Hx of CABG     1992  . Ejection fraction     EF 60%, echo, June, 2012  . BARRETTS ESOPHAGUS 06/02/2007  . Bladder neck obstruction 09/29/2010  . SKIN LESION 06/02/2007     Most Recent Cardiac Studies: Last Cath 10/2010 PROCEDURAL INDICATIONS: [name] is a 78 year old gentleman with  extensive coronary artery disease. He recently underwent complex PCI  involving a saphenous vein graft to the obtuse marginal branches. He  presents today for staged intervention of the LAD. The patient has  severe 95% edge restenosis in the mid-LAD beyond the LIMA anastomosis.  Risks and indications of the procedure were reviewed with the patient.  Informed consent was obtained. Left wrist was prepped, draped and  anesthetized with 1% lidocaine. Using the modified Seldinger technique,  a 6-French sheath was placed into the left radial artery without  difficulty. A 3 mg of verapamil was administered through the sheath.  Angiomax was started for anticoagulation. Once the therapeutic ACT was  achieved, a 90-cm left internal mammary guide catheter was inserted.  The LIMA was difficult to engage, but eventually we were able to  selectively engage the mammary artery and wired the vessel, selective  angiography confirmed critical mid-LAD stenosis with significant lesions  of both the proximal and distal edge of a previously  placed 2.5 x 16-mm  Taxus drug-eluting stent. A Cougar guidewire was advanced across the  lesion with a moderate amount of difficulty and then the vessel was  predilated with a 2.0 x 15-mm Emerge balloon, which was dilated to 10  atmospheres on a single inflation. The vessel was then stented with a  2.25 x 28-mm Promus Element drug-eluting stent, which was carefully  positioned, so that both lesions of the proximal and distal edge of the  stent were covered. It was deployed at 12 atmospheres and appeared well  expanded. The stented segment was then postdilated with a 2.5  x 20-mm  Elysian Quantum apex balloon, which was taken to 16 atmospheres on two  inflations. The stented segment had 0% residual stenosis. There was an  excellent angiographic result. The patient tolerated the procedure  well. A TR band was placed for radial hemostasis.  FINAL CONCLUSION: Successful PCI of severe stenosis in the mid-LAD  using a single long drug-eluting stent.  RECOMMENDATIONS: The patient should continue on dual-antiplatelet  therapy with aspirin and Plavix for a minimum of 12 months and I would  favor lifelong therapy with his bypass graft disease and multivessel  stenting.    Surgical History:  Past Surgical History  Procedure Date  . C-spine disc surgery 03/2006  . C4-5 surgery 01/2009  . Cardiac catheterization     stent placed  . Coronary artery bypass graft   . Appendectomy   . Tonsillectomy   . Coccyx fracture surgery 1997     Home Meds: Prior to Admission medications   Medication Sig Start Date End Date Taking? Authorizing Provider  carisoprodol (SOMA) 350 MG tablet Take 350 mg by mouth every 6 (six) hours as needed. For muscle pain   Yes Historical Provider, MD  Cholecalciferol (VITAMIN D3) 2000 UNITS TABS Take 1 tablet by mouth daily.    Yes Historical Provider, MD  clopidogrel (PLAVIX) 75 MG tablet Take 75 mg by mouth daily.   Yes Historical Provider, MD  Fluticasone-Salmeterol (ADVAIR DISKUS) 250-50 MCG/DOSE AEPB Inhale 1 puff into the lungs 2 (two) times daily. 09/29/10  Yes Corwin Levins, MD  HYDROcodone-acetaminophen (NORCO/VICODIN) 5-325 MG per tablet Take 0.5-1 tablets by mouth every 6 (six) hours as needed. For pain   Yes Historical Provider, MD  metoprolol succinate (TOPROL-XL) 25 MG 24 hr tablet Take 0.5 tablets (12.5 mg total) by mouth 2 (two) times daily. 07/30/11  Yes Luis Abed, MD  montelukast (SINGULAIR) 10 MG tablet Take 10 mg by mouth at bedtime.   Yes Historical Provider, MD  niacin (NIASPAN) 1000 MG CR tablet Take 1,000 mg by mouth at  bedtime. Take with aspirin   Yes Historical Provider, MD  nitroGLYCERIN (NITROSTAT) 0.4 MG SL tablet Place 0.4 mg under the tongue every 5 (five) minutes as needed. For chest pain   Yes Historical Provider, MD  pantoprazole (PROTONIX) 40 MG tablet Take 1 tablet (40 mg total) by mouth daily. 04/01/11  Yes Corwin Levins, MD  pravastatin (PRAVACHOL) 40 MG tablet Take 40 mg by mouth every evening.   Yes Historical Provider, MD  Tamsulosin HCl (FLOMAX) 0.4 MG CAPS Take 0.4 mg by mouth daily after supper.   Yes Historical Provider, MD  albuterol (PROVENTIL HFA) 108 (90 BASE) MCG/ACT inhaler Inhale 2 puffs into the lungs every 6 (six) hours as needed. For shortness of breath or wheezing    Historical Provider, MD  aspirin (ASPIRIN EC) 81 MG EC tablet Take 81 mg by  mouth daily.      Historical Provider, MD    Allergies:  Allergies  Allergen Reactions  . Atenolol Other (See Comments)    depression  . Codeine Other (See Comments)    Crazy feeling  . Ezetimibe-Simvastatin Other (See Comments)    Severe hip pain  . Rosuvastatin Other (See Comments)    Severe hip pain  . Simvastatin Other (See Comments)    Severe hip pain    History   Social History  . Marital Status: Married    Spouse Name: N/A    Number of Children: 3  . Years of Education: N/A   Occupational History  . retired-real estate    Social History Main Topics  . Smoking status: Never Smoker   . Smokeless tobacco: Not on file  . Alcohol Use: Yes  . Drug Use: No  . Sexually Active: Not on file   Other Topics Concern  . Not on file   Social History Narrative   Pt has 51yo son-paranoid schizophrenic, unempolyed, lives with him.     Family History  Problem Relation Age of Onset  . Heart disease Father     mother  . Aneurysm    . Hyperlipidemia    . Liver cancer      grandfather  . Diabetes Sister     mother    Review of Systems: General: negative for chills, fever, night sweats or weight changes.    Cardiovascular: see above Dermatological: negative for rash Respiratory: negative for cough or wheezing Urologic: negative for hematuria Abdominal: negative for nausea, vomiting, diarrhea, bright red blood per rectum, melena, or hematemesis Neurologic: negative for visual changes, syncope, or dizziness All other systems reviewed and are otherwise negative except as noted above.  Labs:   Lab Results  Component Value Date   WBC 11.6* 12/20/2011   HGB 16.2 12/20/2011   HCT 45.9 12/20/2011   MCV 96.0 12/20/2011   PLT 166 12/20/2011     Lab 12/20/11 1815  NA 135  K 4.5  CL 99  CO2 26  BUN 12  CREATININE 1.08  CALCIUM 9.6  PROT 7.2  BILITOT 0.4  ALKPHOS 60  ALT 28  AST 29  GLUCOSE 115*    Lab Results  Component Value Date   CHOL 138 04/01/2011   HDL 40.70 04/01/2011   LDLCALC 74 04/01/2011   TRIG 117.0 04/01/2011   POC troponin 0.16  Radiology/Studies:  Dg Chest 2 View 12/20/2011  *RADIOLOGY REPORT*  Clinical Data: Chest pain, diabetes, asthma  CHEST - 2 VIEW  Comparison: Portable chest x-ray of 10/13/2010  Findings: There is mild left basilar linear atelectasis and/or scarring present.  No active infiltrate or effusion is seen.  The heart is mildly enlarged and stable.  Median sternotomy sutures are noted.  There are degenerative changes diffusely throughout the thoracic spine.  IMPRESSION: No active lung disease.  Mild linear atelectasis and/or scarring at the left lung base.   Original Report Authenticated By: Juline Patch, M.D.     EKG: NSR 1st degree AV block (which is not new) 56bpm, no acute ST-T changes  Physical Exam: Blood pressure 156/90, pulse 61, temperature 98.1 F (36.7 C), temperature source Oral, resp. rate 16, SpO2 97.00%. General: Well developed, well nourished WM in no acute distress. Head: Normocephalic, atraumatic, sclera non-icteric, no xanthomas, nares are without discharge.  Neck: Negative for carotid bruits. JVD not elevated. Lungs: Clear bilaterally  to auscultation without wheezes, rales, or rhonchi. Breathing is  unlabored. Heart: RRR with S1 S2. No murmurs, rubs, or gallops appreciated. Abdomen: Soft, non-tender, non-distended with normoactive bowel sounds. No hepatomegaly. No rebound/guarding. No obvious abdominal masses. Msk:  Strength and tone appear normal for age. Extremities: No clubbing or cyanosis. No edema.  Distal pedal pulses are 2+ and equal bilaterally. Neuro: Alert and oriented X 3. Moves all extremities spontaneously. Psych:  Responds to questions appropriately with a normal affect.    ASSESSMENT AND PLAN:   1. Unstable angina (possible NSTEMI given borderline troponin) with hx of CAD s/p CABG 1992, stenting 2012 2. Cervical disc disease  3. HTN 4. HL, intolerant to most statins 5. Diet controlled diabetes mellitus 6. GERD  Plan: admit, cycle enzymes as first set is already elevated. Dr. Daleen Squibb has instructed the ER to give Plavix 300mg  x 1 to reload, start IV NTG, and start IV heparin per pharmacy. The pt is currently pain free. Will plan cardiac cath in AM. Continue home medications including pain medicine for his neck/shoulder pain from cervical disease.    Signed, Dayna Dunn PA-C 12/20/2011, 7:36 PM  I have taken a history, reviewed medications, allergies, PMH, SH, FH, and reviewed ROS and examined the patient.  I agree with the assessment and plan.  Juanluis Guastella C. Daleen Squibb, MD, [facility] Pager:  [phone]

## 2011-12-21 ENCOUNTER — Encounter (HOSPITAL_COMMUNITY): Payer: Self-pay | Admitting: Cardiology

## 2011-12-21 ENCOUNTER — Encounter (HOSPITAL_COMMUNITY)
Admission: EM | Disposition: A | Discharge status: Home / Self Care | Payer: Self-pay | Source: Home / Self Care | Attending: Cardiology

## 2011-12-21 DIAGNOSIS — I25810 Atherosclerosis of coronary artery bypass graft(s) without angina pectoris: Secondary | ICD-10-CM

## 2011-12-21 HISTORY — PX: LEFT HEART CATHETERIZATION WITH CORONARY/GRAFT ANGIOGRAM: SHX5450

## 2011-12-21 LAB — CBC
HCT: 42.1 % (ref 39.0–52.0)
HCT: 45.3 % (ref 39.0–52.0)
Hemoglobin: 14.8 g/dL (ref 13.0–17.0)
Hemoglobin: 15.7 g/dL (ref 13.0–17.0)
MCH: 33.5 pg (ref 26.0–34.0)
MCH: 33.3 pg (ref 26.0–34.0)
MCHC: 35.2 g/dL (ref 30.0–36.0)
MCHC: 34.7 g/dL (ref 30.0–36.0)
MCV: 95.2 fL (ref 78.0–100.0)
MCV: 96.2 fL (ref 78.0–100.0)
Platelets: 162 10*3/uL (ref 150–400)
Platelets: 144 10*3/uL — ABNORMAL LOW (ref 150–400)
RBC: 4.42 MIL/uL (ref 4.22–5.81)
RBC: 4.71 MIL/uL (ref 4.22–5.81)
RDW: 12.8 % (ref 11.5–15.5)
RDW: 12.9 % (ref 11.5–15.5)
WBC: 11.6 10*3/uL — ABNORMAL HIGH (ref 4.0–10.5)
WBC: 9.7 10*3/uL (ref 4.0–10.5)

## 2011-12-21 LAB — LIPID PANEL
Cholesterol: 124 mg/dL (ref 0–200)
HDL: 35 mg/dL — ABNORMAL LOW (ref >39)
LDL Cholesterol: 77 mg/dL (ref 0–99)
Total CHOL/HDL Ratio: 3.5 RATIO
Triglycerides: 60 mg/dL (ref <150)
VLDL: 12 mg/dL (ref 0–40)

## 2011-12-21 LAB — GLUCOSE, CAPILLARY
Glucose-Capillary: 102 mg/dL — ABNORMAL HIGH (ref 70–99)
Glucose-Capillary: 112 mg/dL — ABNORMAL HIGH (ref 70–99)
Glucose-Capillary: 108 mg/dL — ABNORMAL HIGH (ref 70–99)
Glucose-Capillary: 179 mg/dL — ABNORMAL HIGH (ref 70–99)
Glucose-Capillary: 85 mg/dL (ref 70–99)

## 2011-12-21 LAB — TROPONIN I
Troponin I: 1.7 ng/mL (ref <0.30)
Troponin I: 0.89 ng/mL (ref <0.30)

## 2011-12-21 LAB — BASIC METABOLIC PANEL
BUN: 12 mg/dL (ref 6–23)
CO2: 28 mEq/L (ref 19–32)
Calcium: 9.3 mg/dL (ref 8.4–10.5)
Chloride: 106 mEq/L (ref 96–112)
Creatinine, Ser: 1.08 mg/dL (ref 0.50–1.35)
GFR calc Af Amer: 75 mL/min — ABNORMAL LOW (ref >90)
GFR calc non Af Amer: 65 mL/min — ABNORMAL LOW (ref >90)
Glucose, Bld: 97 mg/dL (ref 70–99)
Potassium: 4 mEq/L (ref 3.5–5.1)
Sodium: 142 mEq/L (ref 135–145)

## 2011-12-21 LAB — POCT ACTIVATED CLOTTING TIME: Activated Clotting Time: 449 seconds

## 2011-12-21 LAB — CREATININE, SERUM
Creatinine, Ser: 0.96 mg/dL (ref 0.50–1.35)
GFR calc Af Amer: >90 mL/min (ref >90)
GFR calc non Af Amer: 79 mL/min — ABNORMAL LOW (ref >90)

## 2011-12-21 LAB — HEPARIN LEVEL (UNFRACTIONATED): Heparin Unfractionated: 0.36 IU/mL (ref 0.30–0.70)

## 2011-12-21 SURGERY — LEFT HEART CATHETERIZATION WITH CORONARY/GRAFT ANGIOGRAM
Anesthesia: LOCAL

## 2011-12-21 MED ORDER — SODIUM CHLORIDE 0.9 % IV SOLN
INTRAVENOUS | Status: AC
  Administered 2011-12-21: 13:00:00 via INTRAVENOUS

## 2011-12-21 MED ORDER — SODIUM CHLORIDE 0.9 % IV SOLN
1.7500 mg/kg/h | INTRAVENOUS | Status: DC
  Filled 2011-12-21 (×2): qty 250

## 2011-12-21 MED ORDER — CLOPIDOGREL BISULFATE 300 MG PO TABS
ORAL_TABLET | ORAL | Status: AC
  Filled 2011-12-21: qty 1

## 2011-12-21 MED ORDER — BIVALIRUDIN 250 MG IV SOLR
INTRAVENOUS | Status: AC
  Filled 2011-12-21: qty 250

## 2011-12-21 MED ORDER — ENOXAPARIN SODIUM 40 MG/0.4ML ~~LOC~~ SOLN
40.0000 mg | SUBCUTANEOUS | Status: DC
  Administered 2011-12-22: 40 mg via SUBCUTANEOUS
  Filled 2011-12-21 (×2): qty 0.4

## 2011-12-21 MED ORDER — HEPARIN (PORCINE) IN NACL 2-0.9 UNIT/ML-% IJ SOLN
INTRAMUSCULAR | Status: AC
  Filled 2011-12-21: qty 1000

## 2011-12-21 MED ORDER — LIDOCAINE HCL (PF) 1 % IJ SOLN
INTRAMUSCULAR | Status: AC
  Filled 2011-12-21: qty 30

## 2011-12-21 MED ORDER — VERAPAMIL HCL 2.5 MG/ML IV SOLN
INTRAVENOUS | Status: AC
  Filled 2011-12-21: qty 2

## 2011-12-21 MED ORDER — MIDAZOLAM HCL 2 MG/2ML IJ SOLN
INTRAMUSCULAR | Status: AC
  Filled 2011-12-21: qty 2

## 2011-12-21 MED ORDER — NITROGLYCERIN 0.2 MG/ML ON CALL CATH LAB
INTRAVENOUS | Status: AC
  Filled 2011-12-21: qty 1

## 2011-12-21 MED ORDER — FENTANYL CITRATE 0.05 MG/ML IJ SOLN
INTRAMUSCULAR | Status: AC
  Filled 2011-12-21: qty 2

## 2011-12-21 NOTE — CV Procedure (Signed)
Cardiac Catheterization Procedure Note  Name: Sean Lozano MRN: [id number] DOB: 09-Jan-1934  Procedure: Left Heart Cath, Selective Coronary Angiography, LV angiography, SVG and LIMA angio,  PTCA of the SVG to the OM system  Indication: Patient with remote CABG and stenting of the LAD distally beyond the LIMA and three stents to the ostium, mid and distal OM graft in 2012.  ASA/plavix were stopped six days earlier, and he developed chest pain and positive enzymes.     Diagnostic Procedure Details: The right groin was prepped, draped, and anesthetized with 1% lidocaine. Using the modified Seldinger technique, a 5 French sheath was introduced into the right femoral artery. Standard Judkins catheters were used for selective coronary angiography, SVG, and LIMA,  and left ventriculography. Catheter exchanges were performed over a wire.  The diagnostic procedure was well-tolerated without immediate complications.  PROCEDURAL FINDINGS Hemodynamics: AO 121/65 (88) LV 114/7 No gradient.   Coronary angiography: Coronary dominance: right  Left mainstem: As on the prior study, there is disease of the LMCA leading into the AV circ.  There are tandem lesions of 60, 70, and 90 leading to the av circ.  The AV circ shows a small branch and the OMs are occluded.    Left anterior descending (LAD): totally occluded proximally  Left circumflex (LCx): see above  Right coronary artery (RCA): total occlusion proximally  SVG to the diagonal is occluded.  SVG to the distal RCA is occluded.  The SVG to the two OM branches has been stented in three locations.  The ostial stent has 30% narrowing.  The mid stent has 95% thrombotic in stent restenosis.  The distal stent to the second branch is widely patent.     The LIMA to the LAD is widely patent. It inserts to the diagonal LAD interface and fills both antegrade.  There is perhaps 30% narrowing just beyond the insertion. The stent site is widely patent and  the distal LAD fills the PDA in a retrograde manner.  It also retrograde fills an RV branch  Left ventriculography: Left ventricular systolic function is normal, LVEF is estimated at 55-65%, there is no significant mitral regurgitation   PCI Procedure Note:  Following the diagnostic procedure, the decision was made to proceed with PCI. The sheath was upsized to a 6 [place]. Weight-based bivalirudin was given for anticoagulation.  Clopidogrel was appropriately dosed.   Once a therapeutic ACT was achieved, a 6 [place] Amplatz AL1 guide catheter was inserted.  A prowater coronary guidewire was used to cross the lesion.  The lesion was predilated with a 2.0, then 3.0  balloon.   Dr. Excell Seltzer and I discussed the case, and given the focal in stent restenosis, the vessel was not protected.  Verapamil was given as 200ug.   We then attempted to pass an 3.5 Expedition stent, but had difficulty getting it to traverse the ostial Promus Element stent from the last procedure.  The Guide was placed coaxially, and two wires, including a BMW wire, were placed, but still the stent would not pass easily through the shaft of the prior stent.  Given the propensity for longitudinal deformation with the Element stent, we then passed a 3.5 balloon down to the mid stent a did two long inflations with an excellent result.  I elected also to redilate the proximal stent in case of unsuspected deformation.   Following PCI, there was 0% residual stenosis and TIMI-3 flow. Final angiography confirmed an excellent result.  The patient tolerated the PCI  procedure well. There were no immediate procedural complications.  The patient was transferred to the post catheterization recovery area for further monitoring.  PCI Data: Vessel - SVG to the OM 1-2/Segment - mid stent restenosis Percent Stenosis (pre)  95% TIMI-flow 2-3 Stent none Percent Stenosis (post) <10 TIMI-flow (post) 3   Final Conclusions:   1.  Successful PCI of instent  renarrowing of a previously placed DES in SVG to the OM system 2.  Non STEMI  Recommendations:  1.  Resume DAPT 2.  Defer neck injections for the present.    Sean Lozano 12/21/2011, 12:21 PM

## 2011-12-21 NOTE — Progress Notes (Signed)
ANTICOAGULATION CONSULT NOTE  Pharmacy Consult:  Heparin Indication: ACS  Allergies  Allergen Reactions  . Atenolol Other (See Comments)    depression  . Codeine Other (See Comments)    Crazy feeling  . Ezetimibe-Simvastatin Other (See Comments)    Severe hip pain  . Rosuvastatin Other (See Comments)    Severe hip pain  . Simvastatin Other (See Comments)    Severe hip pain    Patient Measurements: Heparin Dosing Weight: 95kg  Vital Signs: Temp: 98.2 F (36.8 C) (09/09 2345) Temp src: Oral (09/09 1732) BP: 101/65 mmHg (09/09 2200) Pulse Rate: 67  (09/09 2200)  Labs:  Basename 12/21/11 0320 12/20/11 2116 12/20/11 1815  HGB 14.8 -- 16.2  HCT 42.1 -- 45.9  PLT 162 -- 166  APTT -- -- --  LABPROT -- -- 15.0  INR -- -- 1.16  HEPARINUNFRC 0.36 -- --  CREATININE -- -- 1.08  CKTOTAL -- -- --  CKMB -- -- --  TROPONINI -- 0.74* --    The CrCl is unknown because both a height and weight (above a minimum accepted value) are required for this calculation.  Assessment: 78 YO male with chest pain for Heparin  Goal of Therapy:  Heparin level 0.3-0.7 units/ml Monitor platelets by anticoagulation protocol: Yes   Plan:  Continue Heparin at current rate  F/U after cath  Geannie Risen, PharmD, BCPS  12/21/2011, 4:19 AM

## 2011-12-21 NOTE — Progress Notes (Signed)
Site area: right groin  Site Prior to Removal:  Level 0  Pressure Applied For 20 MINUTES    Minutes Beginning at 1410  Manual:   yes  Patient Status During Pull:  stable  Post Pull Groin Site:  Level 0  Post Pull Instructions Given:  yes  Post Pull Pulses Present:  yes  Dressing Applied:  yes  Comments:   

## 2011-12-21 NOTE — H&P (Signed)
  Patient seen.  He is set up for cath procedure.  CP following dc of DAPT.  Now pain free.  He is agreeable to proceed.  TS

## 2011-12-22 ENCOUNTER — Encounter (HOSPITAL_COMMUNITY): Payer: Self-pay | Admitting: Cardiology

## 2011-12-22 DIAGNOSIS — I214 Non-ST elevation (NSTEMI) myocardial infarction: Principal | ICD-10-CM

## 2011-12-22 LAB — CBC
HCT: 43.8 % (ref 39.0–52.0)
Hemoglobin: 15.3 g/dL (ref 13.0–17.0)
MCH: 33.6 pg (ref 26.0–34.0)
MCHC: 34.9 g/dL (ref 30.0–36.0)
MCV: 96.1 fL (ref 78.0–100.0)
Platelets: 165 10*3/uL (ref 150–400)
RBC: 4.56 MIL/uL (ref 4.22–5.81)
RDW: 13 % (ref 11.5–15.5)
WBC: 10.4 10*3/uL (ref 4.0–10.5)

## 2011-12-22 LAB — BASIC METABOLIC PANEL
BUN: 10 mg/dL (ref 6–23)
CO2: 25 mEq/L (ref 19–32)
Calcium: 9.4 mg/dL (ref 8.4–10.5)
Chloride: 104 mEq/L (ref 96–112)
Creatinine, Ser: 1.03 mg/dL (ref 0.50–1.35)
GFR calc Af Amer: 79 mL/min — ABNORMAL LOW (ref >90)
GFR calc non Af Amer: 68 mL/min — ABNORMAL LOW (ref >90)
Glucose, Bld: 109 mg/dL — ABNORMAL HIGH (ref 70–99)
Potassium: 4.2 mEq/L (ref 3.5–5.1)
Sodium: 138 mEq/L (ref 135–145)

## 2011-12-22 LAB — GLUCOSE, CAPILLARY: Glucose-Capillary: 114 mg/dL — ABNORMAL HIGH (ref 70–99)

## 2011-12-22 MED ORDER — HEART ATTACK BOUNCING BOOK
Freq: Once | Status: AC
  Administered 2011-12-22: 10:00:00
  Filled 2011-12-22: qty 1

## 2011-12-22 MED ORDER — NITROGLYCERIN 0.4 MG/SPRAY TL SOLN: 1.0000 | Status: DC | PRN

## 2011-12-22 MED FILL — Dextrose Inj 5%: INTRAVENOUS | Qty: 1000 | Status: AC

## 2011-12-22 NOTE — Progress Notes (Signed)
CARDIAC REHAB PHASE I   PRE:  Rate/Rhythm: 63SR  BP:  Supine: 135/70  Sitting:   Standing:    SaO2:   MODE:  Ambulation: 500 ft   POST:  Rate/Rhythem: 73SR  BP:  Supine:   Sitting: 150/81  Standing:    SaO2:  [id number] Pt walked 500 ft on RA with steady gait. Tolerated well. Denied chest pain. Education completed. Pt requested referral to [place] Phase 2.  Duanne Limerick

## 2011-12-22 NOTE — Progress Notes (Signed)
Cardiology Progress Note Patient Name: Sean Lozano Date of Encounter: 12/22/2011, 7:25 AM     Subjective  No overnight events. Patient has been able to ambulate without chest pain, sob, or dizziness. Groin site stable. Feels ready to go home.   Objective   Telemetry: sinus rhythm w/ 1st degree AVB 40s-60s  Medications: . aspirin EC  81 mg Oral Daily  . bivalirudin      . cholecalciferol  2,000 Units Oral Daily  . clopidogrel      . clopidogrel  75 mg Oral Q breakfast  . enoxaparin  40 mg Subcutaneous Q24H  . fentaNYL      . Fluticasone-Salmeterol  1 puff Inhalation BID  . heparin      . insulin aspart  0-9 Units Subcutaneous Q4H  . lidocaine      . metoprolol succinate  12.5 mg Oral BID  . midazolam      . midazolam      . montelukast  10 mg Oral QHS  . niacin  1,000 mg Oral QHS  . nitroGLYCERIN      . pantoprazole  40 mg Oral Daily  . pravastatin  40 mg Oral QHS  . Tamsulosin HCl  0.4 mg Oral QPC supper  . verapamil       . sodium chloride 150 mL/hr at 12/21/11 1300  . nitroGLYCERIN 5 mcg/min (12/21/11 0708)  . DISCONTD: sodium chloride    . DISCONTD: bivalirudin (ANGIOMAX) infusion 5 mg/mL (Cath Lab,ACS,PCI indication)    . DISCONTD: heparin 1,300 Units/hr (12/21/11 0600)    Physical Exam: Temp:  [97.3 F (36.3 C)-97.9 F (36.6 C)] 97.8 F (36.6 C) (09/11 0200) Pulse Rate:  [58-68] 61  (09/10 2045) Resp:  [12-22] 13  (09/10 2045) BP: (106-137)/(63-89) 126/77 mmHg (09/10 2045) SpO2:  [92 %-96 %] 96 % (09/11 0200)  General: Overweight elderly white male, in no acute distress. Head: Normocephalic, atraumatic, sclera non-icteric, nares are without discharge.  Neck: Supple. Negative for carotid bruits or JVD Lungs: Clear bilaterally to auscultation without wheezes, rales, or rhonchi. Breathing is unlabored. Heart: RRR S1 S2 without murmurs, rubs, or gallops.  Abdomen: Soft, non-tender, non-distended with normoactive bowel sounds. No rebound/guarding. No  obvious abdominal masses. Msk:  Strength and tone appear normal for age. Extremities: Right groin site intact, no bleeding or bruit.Trace edema. No clubbing or cyanosis. Distal pedal pulses are intact and equal bilaterally. Neuro: Alert and oriented X 3. Moves all extremities spontaneously. Psych:  Responds to questions appropriately with a normal affect.   Intake/Output Summary (Last 24 hours) at 12/22/11 0725 Last data filed at 12/22/11 0200  Gross per 24 hour  Intake 1342.98 ml  Output      3 ml  Net 1339.98 ml    Labs:  BMET Pending  Basename 12/21/11 1607 12/21/11 0320 12/20/11 1815  NA -- 142 135  K -- 4.0 4.5  CL -- 106 99  CO2 -- 28 26  GLUCOSE -- 97 115*  BUN -- 12 12  CREATININE 0.96 1.08 --  CALCIUM -- 9.3 9.6   Basename 12/20/11 1815  AST 29  ALT 28  ALKPHOS 60  BILITOT 0.4  PROT 7.2  ALBUMIN 4.1   Basename 12/22/11 0645 12/21/11 1607 12/20/11 1815  WBC 10.4 9.7 --  NEUTROABS -- -- 7.3  HGB 15.3 15.7 --  HCT 43.8 45.3 --  MCV 96.1 96.2 --  PLT 165 144* --   Basename 12/21/11 0849 12/21/11  0320 12/20/11 2116  TROPONINI 0.89* 1.70* 0.74*   Basename 12/21/11 0320  CHOL 124  HDL 35*  LDLCALC 77  TRIG 60  CHOLHDL 3.5   Radiology/Studies:   12/21/11 - Cardiac Cath Hemodynamics:  AO 121/65 (88)  LV 114/7  No gradient.  Coronary angiography:  Coronary dominance: right  Left mainstem: As on the prior study, there is disease of the LMCA leading into the AV circ. There are tandem lesions of 60, 70, and 90 leading to the av circ. The AV circ shows a small branch and the OMs are occluded.  Left anterior descending (LAD): totally occluded proximally  Left circumflex (LCx): see above  Right coronary artery (RCA): total occlusion proximally  SVG to the diagonal is occluded.  SVG to the distal RCA is occluded.  The SVG to the two OM branches has been stented in three locations. The ostial stent has 30% narrowing. The mid stent has 95% thrombotic in  stent restenosis. The distal stent to the second branch is widely patent.  The LIMA to the LAD is widely patent. It inserts to the diagonal LAD interface and fills both antegrade. There is perhaps 30% narrowing just beyond the insertion. The stent site is widely patent and the distal LAD fills the PDA in a retrograde manner. It also retrograde fills an RV branch  Left ventriculography: Left ventricular systolic function is normal, LVEF is estimated at 55-65%, there is no significant mitral regurgitation  PCI Data:  Vessel - SVG to the OM 1-2/Segment - mid stent restenosis  Percent Stenosis (pre) 95%  TIMI-flow 2-3  Stent none  Percent Stenosis (post) <10  TIMI-flow (post) 3  Final Conclusions:  1. Successful PCI of instent renarrowing of a previously placed DES in SVG to the OM system  2. Non STEMI  Recommendations:  1. Resume DAPT  2. Defer neck injections for the present  9/9/201 - Chest 2 View Findings: There is mild left basilar linear atelectasis and/or scarring present.  No active infiltrate or effusion is seen.  The heart is mildly enlarged and stable.  Median sternotomy sutures are noted.  There are degenerative changes diffusely throughout the thoracic spine.  IMPRESSION: No active lung disease.  Mild linear atelectasis and/or scarring at the left lung base.        Assessment and Plan   1. NSTEMI s/p PTCA of instent renarrowing of DES in SVG-OM 2. Coronary Artery Disease s/p 6v CABG 1992 & multiple PCIs 3. Hyperlipidemia 4. Hypertension 5. Diabetes Mellitus, Type 2, Diet Controlled 6. Cervical Disc Disease 7. GERD  Patient underwent cardiac cath yesterday with successful balloon angioplasty to the previously placed DES in the SVG to OM system. Recommendations were made for resumption of DAPT. Cont ASA, Plavix, Statin, BB. Currently on Toprol 12.5mg  BID. Note he has sinus brady with 1st degree AVB with rates 40-60s on telemetry, but remains asymptomatic. Has been able to  ambulate without chest pain, sob, or dizziness. Groin site stable without bleeding. BMET pending. BPs stable. CBGs stable on SSI. Recommendations were made for delaying of neck injections for his cervical stenosis. Patient reports improvement in neck pain since PCI. Cont PPI.  Disposition: Likely home today pending evaluation by Dr. Excell Seltzer  Signed, HOPE, JESSICA PA-C  Patient seen, examined. Available data reviewed. Agree with findings, assessment, and plan as outlined by [facility], PA-C. He is doing well following PCI. He would like NTG spray instead of SL. He has proven that he will require lifelong DAPT  with ASA and plavix as this event occurred after a short interruption of therapy. He will have to defer neck injection. Will arrange a PA visit for hospital f/u. He is scheduled to see Dr Myrtis Ser in November.  Tonny Bollman, M.D. 12/22/2011 8:22 AM

## 2011-12-22 NOTE — Discharge Summary (Signed)
Discharge Summary   Patient ID: Sean Lozano MRN: [id number], DOB/AGE: 1933/04/29 78 y.o.  Primary MD: Oliver Barre, MD Primary Cardiologist: Dr. Myrtis Ser Admit date: 12/20/2011 D/C date:     12/22/2011      Primary Discharge Diagnoses:  1. NSTEMI/Coronary Artery Disease  - H/o 6v CABG 1992 & multiple PCIs  - NSTEMI this admission s/p PTCA of instent renarrowing of DES in SVG-OM  - Recs for lifelong DAPT w/ ASA/Plavix  2. Sinus bradycardia w/ 1st degree AVB  - Asymptomatic on BB  3. Cervical Disc Disease  - Scheduled to undergo epidural injection for cervical stenosis pain on 9/11  - Recommend delaying of this procedure given NSTEMI  Secondary Discharge Diagnoses:   Hyperlipidemia  Hypertension  Diabetes mellitus, Type 2 - diet controlled  GERD  Rosacea    Prostatitis    Depression    Benign prostatic hypertrophy    Asthma    Paresthesias   Arthritis   Diverticulosis   BARRETTS ESOPHAGUS   Bladder neck obstruction 09/29/2010    SKIN LESION    Allergies Allergies  Allergen Reactions  . Atenolol Other (See Comments)    depression  . Codeine Other (See Comments)    Crazy feeling  . Ezetimibe-Simvastatin Other (See Comments)    Severe hip pain  . Rosuvastatin Other (See Comments)    Severe hip pain  . Simvastatin Other (See Comments)    Severe hip pain    Diagnostic Studies/Procedures:   12/21/11 - Cardiac Cath  Hemodynamics:  AO 121/65 (88)  LV 114/7  No gradient.  Coronary angiography:  Coronary dominance: right  Left mainstem: As on the prior study, there is disease of the LMCA leading into the AV circ. There are tandem lesions of 60, 70, and 90 leading to the av circ. The AV circ shows a small branch and the OMs are occluded.  Left anterior descending (LAD): totally occluded proximally  Left circumflex (LCx): see above  Right coronary artery (RCA): total occlusion proximally  SVG to the diagonal is occluded.  SVG to the distal RCA is  occluded.  The SVG to the two OM branches has been stented in three locations. The ostial stent has 30% narrowing. The mid stent has 95% thrombotic in stent restenosis. The distal stent to the second branch is widely patent.  The LIMA to the LAD is widely patent. It inserts to the diagonal LAD interface and fills both antegrade. There is perhaps 30% narrowing just beyond the insertion. The stent site is widely patent and the distal LAD fills the PDA in a retrograde manner. It also retrograde fills an RV branch  Left ventriculography: Left ventricular systolic function is normal, LVEF is estimated at 55-65%, there is no significant mitral regurgitation  PCI Data:  Vessel - SVG to the OM 1-2/Segment - mid stent restenosis  Percent Stenosis (pre) 95%  TIMI-flow 2-3  Stent none  Percent Stenosis (post) <10  TIMI-flow (post) 3  Final Conclusions:  1. Successful PCI of instent renarrowing of a previously placed DES in SVG to the OM system  2. Non STEMI  Recommendations:  1. Resume DAPT  2. Defer neck injections for the present   History of Present Illness: 78 y.o. male w/ the above medical problems who presented to [facility] on [facility]11 with complaints of chest pain and ruled in for NSTEMI.  Patient reported stopping his Plavix and ASA on 9/3 in preparation for epidural injection for cervical stenosis pain. He  was doing well until the evening of presentation when he developed chest pressure with radiation to his arms/shoulders relieved with SL NTG prompting him to seek medical attention.   Hospital Course: In the ED, EKG revealed NSR 1st degree AV block, no acute ST/T changes. CXR was without acute cardiopulmonary abnormalities. Labs were significant for poc troponin 0.16, WBC 11.6, otherwise unremarkable CBC/CMET. Given his NSTEMI and symptoms concerning for unstable angina he was loaded with Plavix, placed on IV NTG and IV heparin and admitted for further evaluation and treatment.   He  underwent cardiac cath 12/21/11 revealing in-stent restenosis with successful balloon angioplasty to the previously placed DES in the SVG to OM system. He tolerated the procedure well without complications. Recommendations were made for lifelong DAPT with ASA/Plavix and continued medical therapy. Cardiac enzymes were cycled and trended down. He was able to ambulate without complaints of chest pain, sob, or dizziness. Cath site remained stable. He was seen and evaluated by Dr. Excell Seltzer who felt he was stable for discharge home with plans for follow up as scheduled below. Recommendations were made for delaying of neck injections for his cervical stenosis.  Discharge Vitals: Blood pressure 126/77, pulse 61, temperature 97.8 F (36.6 C), temperature source Oral, resp. rate 13, height 5' 11.5" (1.816 m), weight 213 lb 10 oz (96.9 kg), SpO2 96.00%.  Labs: Component Value Date   WBC 10.4 12/22/2011   HGB 15.3 12/22/2011   HCT 43.8 12/22/2011   MCV 96.1 12/22/2011   PLT 165 12/22/2011    Lab 12/22/11 0645 12/20/11 1815  NA 138 --  K 4.2 --  CL 104 --  CO2 25 --  BUN 10 --  CREATININE 1.03 --  CALCIUM 9.4 --  PROT -- 7.2  BILITOT -- 0.4  ALKPHOS -- 60  ALT -- 28  AST -- 29  GLUCOSE 109* --   Basename 12/21/11 0849 12/21/11 0320 12/20/11 2116  TROPONINI 0.89* 1.70* 0.74*   Component Value Date   CHOL 124 12/21/2011   HDL 35* 12/21/2011   LDLCALC 77 12/21/2011   TRIG 60 12/21/2011      Discharge Medications     Medication List     As of 12/22/2011 10:28 AM    TAKE these medications         aspirin EC 81 MG EC tablet   Generic drug: aspirin   Take 81 mg by mouth daily.      carisoprodol 350 MG tablet   Commonly known as: SOMA   Take 350 mg by mouth every 6 (six) hours as needed. For muscle pain      clopidogrel 75 MG tablet   Commonly known as: PLAVIX   Take 75 mg by mouth daily.      Fluticasone-Salmeterol 250-50 MCG/DOSE Aepb   Commonly known as: ADVAIR   Inhale 1 puff into  the lungs 2 (two) times daily.      HYDROcodone-acetaminophen 5-325 MG per tablet   Commonly known as: NORCO/VICODIN   Take 0.5-1 tablets by mouth every 6 (six) hours as needed. For pain      metoprolol succinate 25 MG 24 hr tablet   Commonly known as: TOPROL-XL   Take 0.5 tablets (12.5 mg total) by mouth 2 (two) times daily.      montelukast 10 MG tablet   Commonly known as: SINGULAIR   Take 10 mg by mouth at bedtime.      niacin 1000 MG CR tablet   Commonly known as: NIASPAN  Take 1,000 mg by mouth at bedtime. Take with aspirin      nitroGLYCERIN 0.4 MG/SPRAY spray   Commonly known as: NITROLINGUAL   Place 1 spray under the tongue every 5 (five) minutes as needed for chest pain (up to 3 doses).      nitroGLYCERIN 0.4 MG SL tablet   Commonly known as: NITROSTAT   Place 0.4 mg under the tongue every 5 (five) minutes as needed. For chest pain      pantoprazole 40 MG tablet   Commonly known as: PROTONIX   Take 1 tablet (40 mg total) by mouth daily.      pravastatin 40 MG tablet   Commonly known as: PRAVACHOL   Take 40 mg by mouth every evening.      PROVENTIL HFA 108 (90 BASE) MCG/ACT inhaler   Generic drug: albuterol   Inhale 2 puffs into the lungs every 6 (six) hours as needed. For shortness of breath or wheezing      Tamsulosin HCl 0.4 MG Caps   Commonly known as: FLOMAX   Take 0.4 mg by mouth daily after supper.      Vitamin D3 2000 UNITS Tabs   Take 1 tablet by mouth daily.          Disposition   Discharge Orders    Future Appointments: Provider: Department: Dept Phone: Center:   01/03/2012 10:10 AM Beatrice Lecher, PA [facility] [id number]     Future Orders Please Complete By Expires   Amb Referral to Cardiac Rehabilitation      Diet - low sodium heart healthy      Increase activity slowly      Discharge instructions      Comments:   * KEEP GROIN SITE CLEAN AND DRY. Call the office for any signs of bleedings, pus, swelling,  increased pain, or any other concerns. * NO HEAVY LIFTING (>10lbs) X 2 WEEKS. * NO SEXUAL ACTIVITY X 2 WEEKS. * NO DRIVING X 1 WEEK. * NO SOAKING BATHS, HOT TUBS, POOLS, ETC., X 7 DAYS.  * It is recommended you delay the previously scheduled epidural injections. Please resume taking your Aspirin and Plavix daily as prescribed.     Follow-up Information    Follow up with Tereso Newcomer, PA. On 01/03/2012. (10:10)    Contact information:   [facility] [address] [place] [address] [phone]           Outstanding Labs/Studies:  None  Duration of Discharge Encounter: Greater than 30 minutes including physician and PA time.  Signed, HOPE, JESSICA PA-C 12/22/2011, 10:28 AM

## 2011-12-24 ENCOUNTER — Other Ambulatory Visit: Payer: Self-pay | Admitting: Cardiology

## 2011-12-24 ENCOUNTER — Other Ambulatory Visit: Payer: Self-pay | Admitting: Internal Medicine

## 2011-12-24 NOTE — ED Provider Notes (Signed)
I saw and evaluated the patient, reviewed the resident's note and I agree with the findings and plan.  Patient with known CAD presents with CP that resolved with ASA and NTG, will be admitted, Heparin started for concern for unstable angina. EKG seen by me and No Acute Changes.  CRITICAL CARE Performed by: Shelda Jakes.   Total critical care time: 30  Critical care time was exclusive of separately billable procedures and treating other patients.  Critical care was necessary to treat or prevent imminent or life-threatening deterioration.  Critical care was time spent personally by me on the following activities: development of treatment plan with patient and/or surrogate as well as nursing, discussions with consultants, evaluation of patient's response to treatment, examination of patient, obtaining history from patient or surrogate, ordering and performing treatments and interventions, ordering and review of laboratory studies, ordering and review of radiographic studies, pulse oximetry and re-evaluation of patient's condition.  Shelda Jakes, MD 12/24/11 [phone]

## 2012-01-03 ENCOUNTER — Ambulatory Visit (INDEPENDENT_AMBULATORY_CARE_PROVIDER_SITE_OTHER): Payer: Medicare Other | Admitting: Physician Assistant

## 2012-01-03 ENCOUNTER — Encounter: Payer: Self-pay | Admitting: Physician Assistant

## 2012-01-03 VITALS — BP 106/68 | HR 68 | Ht 71.0 in | Wt 213.1 lb

## 2012-01-03 DIAGNOSIS — M5090 Cervical disc disorder, unspecified, unspecified cervical region: Secondary | ICD-10-CM

## 2012-01-03 DIAGNOSIS — I2510 Atherosclerotic heart disease of native coronary artery without angina pectoris: Secondary | ICD-10-CM

## 2012-01-03 DIAGNOSIS — I10 Essential (primary) hypertension: Secondary | ICD-10-CM

## 2012-01-03 DIAGNOSIS — E785 Hyperlipidemia, unspecified: Secondary | ICD-10-CM

## 2012-01-03 NOTE — Patient Instructions (Addendum)
You have been referred to CARDIAC REHAB TO BE DONE @ [facility]  Your physician recommends that you schedule a follow-up appointment in: 04/03/12 WITH DR. KATZ  @ 9:30

## 2012-01-03 NOTE — Progress Notes (Signed)
[address] [place], [place]  [address] Phone: [phone] Fax:  [phone]  Date:  01/03/2012   Name:  Sean Lozano   DOB:  1933-06-17   MRN:  [id number]  PCP:  Sean Barre, MD  Primary Cardiologist:  Sean Lozano  Primary Electrophysiologist:  None    History of Present Illness: Sean Lozano is a 78 y.o. male who returns for post hospital follow up.  He has a history of CAD, status post CABG in 1992, status post extensive stenting to the SVG-OM and staged PCI to the LAD beyond the LIMA in 7/12, HTN, HL, GERD, DM2.  He was admitted 9/9-9/11 with a non-STEMI. He had been off of this aspirin and Plavix for approximately 6 days for epidural cervical spine injection. LHC 12/21/11: Severe native three-vessel disease, SVG-OM1/OM 2 ostial stent 30%, mid stent 95% thrombotic ISR, distal stent patent, LIMA-LAD patent, LAD stent beyond the graft patent, SVG-diagonal occluded, SVG-distal RCA occluded, EF 55-65%. He underwent POBA to the S-OM mid stent ISR with good results.  Lifelong aspirin and Plavix was recommended.  He is overall doing well. Denies any chest pain, shortness of breath, syncope, orthopnea, PND or edema. He is interested in cardiac rehabilitation.  Labs (9/13):  K 4.2,  Creatinine 1.03, ALT 28, LDL 77, Hgb 15.3   Wt Readings from Last 3 Encounters:  01/03/12 213 lb 1.9 oz (96.671 kg)  12/21/11 213 lb 10 oz (96.9 kg)  12/21/11 213 lb 10 oz (96.9 kg)     Past Medical History  Diagnosis Date  . CAD (coronary artery disease)     a. CABG x 6 in 1992. b. s/p DES x 2 to SVG -> OM1/OM2 in 09/2010. c. s/p DES to mid LAD 10/2010. d. NSTEMI (off DAPT) s/p PTCA of instent renarrowing of DES in SVG-OM, Recs for lifelong DAPT   . Hyperlipidemia     intolerance to Crestor Zocor Vytorin. He tolerates Pravachol...low HDL  . GERD (gastroesophageal reflux disease)   . Rosacea   . Prostatitis   . Depression   . Benign prostatic hypertrophy   . Asthma   .  Paresthesias     successful surgery by Dr. Julio Lozano  . Arthritis   . Diabetes mellitus     type II-diet  . Diverticulosis   . Hx of CABG     1992  . Ejection fraction     EF 60%, echo, June, 2012  . BARRETTS ESOPHAGUS 06/02/2007  . Bladder neck obstruction 09/29/2010  . SKIN LESION 06/02/2007  . 1st degree AV block   . Sinus bradycardia     asymptomatic   . Cervical disc disease     Current Outpatient Prescriptions  Medication Sig Dispense Refill  . ADVAIR DISKUS 250-50 MCG/DOSE AEPB TAKE 1 PUFF BY MOUTH TWICE A DAY  60 each  3  . albuterol (PROVENTIL HFA) 108 (90 BASE) MCG/ACT inhaler Inhale 2 puffs into the lungs every 6 (six) hours as needed. For shortness of breath or wheezing      . aspirin (ASPIRIN EC) 81 MG EC tablet Take 81 mg by mouth daily.        . carisoprodol (SOMA) 350 MG tablet Take 350 mg by mouth every 6 (six) hours as needed. For muscle pain      . Cholecalciferol (VITAMIN D3) 2000 UNITS TABS Take 1 tablet by mouth daily.       . clopidogrel (PLAVIX) 75 MG tablet Take 75  mg by mouth daily.      Marland Kitchen HYDROcodone-acetaminophen (NORCO/VICODIN) 5-325 MG per tablet Take 0.5-1 tablets by mouth every 6 (six) hours as needed. For pain      . metoprolol succinate (TOPROL-XL) 25 MG 24 hr tablet TAKE 1/2 TABLET TWICE A DAY  60 tablet  12  . montelukast (SINGULAIR) 10 MG tablet Take 10 mg by mouth at bedtime.      . niacin (NIASPAN) 1000 MG CR tablet Take 1,000 mg by mouth at bedtime. Take with aspirin      . nitroGLYCERIN (NITROSTAT) 0.4 MG SL tablet Place 0.4 mg under the tongue every 5 (five) minutes as needed. For chest pain      . pantoprazole (PROTONIX) 40 MG tablet Take 1 tablet (40 mg total) by mouth daily.  90 tablet  3  . pravastatin (PRAVACHOL) 40 MG tablet Take 40 mg by mouth every evening.      . Tamsulosin HCl (FLOMAX) 0.4 MG CAPS Take 0.4 mg by mouth daily after supper.        Allergies: Allergies  Allergen Reactions  . Atenolol Other (See Comments)     depression  . Codeine Other (See Comments)    Crazy feeling  . Ezetimibe-Simvastatin Other (See Comments)    Severe hip pain  . Rosuvastatin Other (See Comments)    Severe hip pain  . Simvastatin Other (See Comments)    Severe hip pain    History  Substance Use Topics  . Smoking status: Former Smoker    Start date: 01/03/1955  . Smokeless tobacco: Not on file  . Alcohol Use: Yes     ROS:  Please see the history of present illness.   No bleeding.  Neck pain is tolerable.   All other systems reviewed and negative.   PHYSICAL EXAM: VS:  BP 106/68  Pulse 68  Ht 5\' 11"  (1.803 m)  Wt 213 lb 1.9 oz (96.671 kg)  BMI 29.72 kg/m2 Well nourished, well developed, in no acute distress HEENT: normal Neck: no JVD Cardiac:  normal S1, S2; RRR; no murmur Lungs:  clear to auscultation bilaterally, no wheezing, rhonchi or rales Abd: soft, nontender, no hepatomegaly Ext: no edema; right groin without hematoma or bruit  Skin: warm and dry Neuro:  CNs 2-12 intact, no focal abnormalities noted  EKG:  Sinus rhythm, heart rate 68, normal axis, first degree AV block, PR interval 262 ms, no ischemic change      ASSESSMENT AND PLAN:  1. Coronary Artery Disease: He is doing well status post balloon angioplasty to the stent in the mid body of the vein graft to the OM in the setting of a non-STEMI.  We discussed the importance of dual antiplatelet therapy.  He will need to remain on Plavix and aspirin lifelong. We will refer him to cardiac rehabilitation. Followup with Sean Lozano in 2-3 months.  2. Hyperlipidemia: Recent LDL optimal. Continue current therapy.  3. Hypertension: Controlled.  4. Cervical Disc Disease: Cervical epidural spinal injections will need to be placed on hold for the foreseeable future.  Sean Glasgow, PA-C  10:38 AM 01/03/2012

## 2012-01-13 ENCOUNTER — Encounter (HOSPITAL_COMMUNITY)
Admission: RE | Admit: 2012-01-13 | Discharge: 2012-01-13 | Disposition: A | Discharge status: Home / Self Care | Payer: Medicare Other | Source: Ambulatory Visit | Attending: Cardiology | Admitting: Cardiology

## 2012-01-13 DIAGNOSIS — Z5189 Encounter for other specified aftercare: Secondary | ICD-10-CM | POA: Insufficient documentation

## 2012-01-13 DIAGNOSIS — Z951 Presence of aortocoronary bypass graft: Secondary | ICD-10-CM | POA: Insufficient documentation

## 2012-01-13 DIAGNOSIS — Z7982 Long term (current) use of aspirin: Secondary | ICD-10-CM | POA: Insufficient documentation

## 2012-01-13 DIAGNOSIS — I440 Atrioventricular block, first degree: Secondary | ICD-10-CM | POA: Insufficient documentation

## 2012-01-13 DIAGNOSIS — I10 Essential (primary) hypertension: Secondary | ICD-10-CM | POA: Insufficient documentation

## 2012-01-13 DIAGNOSIS — E785 Hyperlipidemia, unspecified: Secondary | ICD-10-CM | POA: Insufficient documentation

## 2012-01-13 DIAGNOSIS — I252 Old myocardial infarction: Secondary | ICD-10-CM | POA: Insufficient documentation

## 2012-01-13 DIAGNOSIS — F329 Major depressive disorder, single episode, unspecified: Secondary | ICD-10-CM | POA: Insufficient documentation

## 2012-01-13 DIAGNOSIS — I498 Other specified cardiac arrhythmias: Secondary | ICD-10-CM | POA: Insufficient documentation

## 2012-01-13 DIAGNOSIS — K219 Gastro-esophageal reflux disease without esophagitis: Secondary | ICD-10-CM | POA: Insufficient documentation

## 2012-01-13 DIAGNOSIS — Z7902 Long term (current) use of antithrombotics/antiplatelets: Secondary | ICD-10-CM | POA: Insufficient documentation

## 2012-01-13 DIAGNOSIS — I2510 Atherosclerotic heart disease of native coronary artery without angina pectoris: Secondary | ICD-10-CM | POA: Insufficient documentation

## 2012-01-13 DIAGNOSIS — I214 Non-ST elevation (NSTEMI) myocardial infarction: Secondary | ICD-10-CM | POA: Insufficient documentation

## 2012-01-13 DIAGNOSIS — F3289 Other specified depressive episodes: Secondary | ICD-10-CM | POA: Insufficient documentation

## 2012-01-13 DIAGNOSIS — E119 Type 2 diabetes mellitus without complications: Secondary | ICD-10-CM | POA: Insufficient documentation

## 2012-01-13 DIAGNOSIS — Z79899 Other long term (current) drug therapy: Secondary | ICD-10-CM | POA: Insufficient documentation

## 2012-01-13 DIAGNOSIS — I2582 Chronic total occlusion of coronary artery: Secondary | ICD-10-CM | POA: Insufficient documentation

## 2012-01-13 DIAGNOSIS — Z9861 Coronary angioplasty status: Secondary | ICD-10-CM | POA: Insufficient documentation

## 2012-01-13 NOTE — Progress Notes (Signed)
Cardiac Rehab Medication Review by a Pharmacist  Does the patient  feel that his/her medications are working for him/her?  yes  Has the patient been experiencing any side effects to the medications prescribed?  no  Does the patient measure his/her own blood pressure or blood glucose at home?  yes   Does the patient have any problems obtaining medications due to transportation or finances?   no  Understanding of regimen: good Understanding of indications: good Potential of compliance: excellent    Pharmacist comments: Patient has a good understanding of medication and their indications.    Lillia Pauls, PharmD Clinical Pharmacist Pager: [phone] Phone: [phone] 01/13/2012 9:26 AM

## 2012-01-17 ENCOUNTER — Encounter (HOSPITAL_COMMUNITY): Payer: Self-pay

## 2012-01-17 ENCOUNTER — Encounter (HOSPITAL_COMMUNITY)
Admission: RE | Admit: 2012-01-17 | Discharge: 2012-01-17 | Disposition: A | Discharge status: Home / Self Care | Payer: Medicare Other | Source: Ambulatory Visit | Attending: Cardiology | Admitting: Cardiology

## 2012-01-17 LAB — GLUCOSE, CAPILLARY
Glucose-Capillary: 148 mg/dL — ABNORMAL HIGH (ref 70–99)
Glucose-Capillary: 95 mg/dL (ref 70–99)

## 2012-01-17 NOTE — Progress Notes (Signed)
Pt started cardiac rehab today.  Pt tolerated light exercise without difficulty.  Asymptomatic.  VSS, telemetry-sinus rhythm.  Pt oriented to exercise equipment and routine.  Understanding verbalized.

## 2012-01-19 ENCOUNTER — Encounter (HOSPITAL_COMMUNITY)
Admission: RE | Admit: 2012-01-19 | Discharge: 2012-01-19 | Disposition: A | Discharge status: Home / Self Care | Payer: Medicare Other | Source: Ambulatory Visit | Attending: Cardiology | Admitting: Cardiology

## 2012-01-19 LAB — GLUCOSE, CAPILLARY: Glucose-Capillary: 89 mg/dL (ref 70–99)

## 2012-01-21 ENCOUNTER — Encounter (HOSPITAL_COMMUNITY)
Admission: RE | Admit: 2012-01-21 | Discharge: 2012-01-21 | Disposition: A | Discharge status: Home / Self Care | Payer: Medicare Other | Source: Ambulatory Visit | Attending: Cardiology | Admitting: Cardiology

## 2012-01-24 ENCOUNTER — Encounter (HOSPITAL_COMMUNITY)
Admission: RE | Admit: 2012-01-24 | Discharge: 2012-01-24 | Disposition: A | Discharge status: Home / Self Care | Payer: Medicare Other | Source: Ambulatory Visit | Attending: Cardiology | Admitting: Cardiology

## 2012-01-26 ENCOUNTER — Encounter (HOSPITAL_COMMUNITY)
Admission: RE | Admit: 2012-01-26 | Discharge: 2012-01-26 | Disposition: A | Discharge status: Home / Self Care | Payer: Medicare Other | Source: Ambulatory Visit | Attending: Cardiology | Admitting: Cardiology

## 2012-01-26 NOTE — Progress Notes (Signed)
Reviewed home exercise guidelines with patient including endpoints, temperature precautions, target heart rate and rate of perceived exertions. Pt is walking 39-45 minutes as his mode of home exercise. Pt voices understanding of instructions given.

## 2012-01-28 ENCOUNTER — Encounter (HOSPITAL_COMMUNITY)
Admission: RE | Admit: 2012-01-28 | Discharge: 2012-01-28 | Disposition: A | Discharge status: Home / Self Care | Payer: Medicare Other | Source: Ambulatory Visit | Attending: Cardiology | Admitting: Cardiology

## 2012-01-28 LAB — GLUCOSE, CAPILLARY
Glucose-Capillary: 222 mg/dL — ABNORMAL HIGH (ref 70–99)
Glucose-Capillary: 93 mg/dL (ref 70–99)

## 2012-01-31 ENCOUNTER — Encounter (HOSPITAL_COMMUNITY)
Admission: RE | Admit: 2012-01-31 | Discharge: 2012-01-31 | Disposition: A | Discharge status: Home / Self Care | Payer: Medicare Other | Source: Ambulatory Visit | Attending: Cardiology | Admitting: Cardiology

## 2012-02-02 ENCOUNTER — Encounter (HOSPITAL_COMMUNITY)
Admission: RE | Admit: 2012-02-02 | Discharge: 2012-02-02 | Disposition: A | Discharge status: Home / Self Care | Payer: Medicare Other | Source: Ambulatory Visit | Attending: Cardiology | Admitting: Cardiology

## 2012-02-02 LAB — GLUCOSE, CAPILLARY: Glucose-Capillary: 154 mg/dL — ABNORMAL HIGH (ref 70–99)

## 2012-02-04 ENCOUNTER — Encounter (HOSPITAL_COMMUNITY)
Admission: RE | Admit: 2012-02-04 | Discharge: 2012-02-04 | Disposition: A | Discharge status: Home / Self Care | Payer: Medicare Other | Source: Ambulatory Visit | Attending: Cardiology | Admitting: Cardiology

## 2012-02-04 LAB — GLUCOSE, CAPILLARY: Glucose-Capillary: 177 mg/dL — ABNORMAL HIGH (ref 70–99)

## 2012-02-07 ENCOUNTER — Encounter (HOSPITAL_COMMUNITY)
Admission: RE | Admit: 2012-02-07 | Discharge: 2012-02-07 | Disposition: A | Discharge status: Home / Self Care | Payer: Medicare Other | Source: Ambulatory Visit | Attending: Cardiology | Admitting: Cardiology

## 2012-02-07 LAB — GLUCOSE, CAPILLARY
Glucose-Capillary: 189 mg/dL — ABNORMAL HIGH (ref 70–99)
Glucose-Capillary: 98 mg/dL (ref 70–99)

## 2012-02-07 NOTE — Progress Notes (Addendum)
Sean Lozano 77 y.o. male Nutrition Note Spoke with pt.  Nutrition Plan, Nutrition Survey, and cholesterol goals reviewed with pt. Pt is following Step 1 of the Therapeutic Lifestyle Changes diet. Pt wants to lose wt and reports he has lost 13# over the past year due to "eating healthier and being more active." Pt is walking for 15 minutes (1/2 to 2/3 of a mile) on the days he does not come to exercise. Pt states he would like to increase exercise to 30 minutes daily "but my knee gives out." Per pt, he is following a 1200-1500 kcal diet. Wt loss tips reviewed. Pt is a diet-controlled diabetic. Last A1c indicates blood glucose well-controlled. Pt checks fasting CBG's q am and CBG's reportedly 110-119 mg/dL. Recommended fasting CBG range reviewed with pt. This Clinical research associate went over Diabetes Education test results. Pt reports he recently received DM education from [facility] May, CDE. Pt expressed understanding of the information reviewed. Pt aware of nutrition education classes offered and plans on attending cardiac nutrition classes. Pt is not interested in attending DM classes at this time due to "I just took a DM class the week before I started rehab."  Nutrition Diagnosis   Food-and nutrition-related knowledge deficit related to lack of exposure to information as related to diagnosis of: ? CVD ? DM (A1c 6.3)    Overweight related to excessive energy intake as evidenced by a BMI of 29.6  Nutrition RX/ Estimated Daily Nutrition Needs for: wt loss  1600-2100 Kcal, 40-55 gm fat, 10-14 gm sat fat, 1.5-2.1 gm trans-fat, <1500 mg sodium, 175 gm CHO   Nutrition Intervention   Pt's individual nutrition plan including cholesterol goals reviewed with pt.   Benefits of adopting Therapeutic Lifestyle Changes discussed when Medficts reviewed.   Pt to attend the Portion Distortion class   Pt to attend the  ? Nutrition I class                   ? Nutrition II class                                   ? Diabetes Q & A  class - met 01/28/12   Continue client-centered nutrition education by RD, as part of interdisciplinary care. Goal(s)   Pt to identify and limit food sources of saturated fat, trans fat, and cholesterol   Pt to identify food quantities necessary to achieve: ? wt loss to a goal wt of 189-201 lb (85.9-91.3 kg) at graduation from cardiac rehab.  Monitor and Evaluate progress toward nutrition goal with team. Nutrition Risk: Change to Moderate

## 2012-02-09 ENCOUNTER — Encounter (HOSPITAL_COMMUNITY)
Admission: RE | Admit: 2012-02-09 | Discharge: 2012-02-09 | Disposition: A | Discharge status: Home / Self Care | Payer: Medicare Other | Source: Ambulatory Visit | Attending: Cardiology | Admitting: Cardiology

## 2012-02-09 LAB — GLUCOSE, CAPILLARY
Glucose-Capillary: 107 mg/dL — ABNORMAL HIGH (ref 70–99)
Glucose-Capillary: 98 mg/dL (ref 70–99)

## 2012-02-11 ENCOUNTER — Encounter (HOSPITAL_COMMUNITY)
Admission: RE | Admit: 2012-02-11 | Discharge: 2012-02-11 | Disposition: A | Discharge status: Home / Self Care | Payer: Medicare Other | Source: Ambulatory Visit | Attending: Cardiology | Admitting: Cardiology

## 2012-02-11 DIAGNOSIS — F3289 Other specified depressive episodes: Secondary | ICD-10-CM | POA: Insufficient documentation

## 2012-02-11 DIAGNOSIS — Z79899 Other long term (current) drug therapy: Secondary | ICD-10-CM | POA: Insufficient documentation

## 2012-02-11 DIAGNOSIS — I252 Old myocardial infarction: Secondary | ICD-10-CM | POA: Insufficient documentation

## 2012-02-11 DIAGNOSIS — E119 Type 2 diabetes mellitus without complications: Secondary | ICD-10-CM | POA: Insufficient documentation

## 2012-02-11 DIAGNOSIS — K219 Gastro-esophageal reflux disease without esophagitis: Secondary | ICD-10-CM | POA: Insufficient documentation

## 2012-02-11 DIAGNOSIS — Z9861 Coronary angioplasty status: Secondary | ICD-10-CM | POA: Insufficient documentation

## 2012-02-11 DIAGNOSIS — I2582 Chronic total occlusion of coronary artery: Secondary | ICD-10-CM | POA: Insufficient documentation

## 2012-02-11 DIAGNOSIS — I10 Essential (primary) hypertension: Secondary | ICD-10-CM | POA: Insufficient documentation

## 2012-02-11 DIAGNOSIS — F329 Major depressive disorder, single episode, unspecified: Secondary | ICD-10-CM | POA: Insufficient documentation

## 2012-02-11 DIAGNOSIS — I498 Other specified cardiac arrhythmias: Secondary | ICD-10-CM | POA: Insufficient documentation

## 2012-02-11 DIAGNOSIS — Z7982 Long term (current) use of aspirin: Secondary | ICD-10-CM | POA: Insufficient documentation

## 2012-02-11 DIAGNOSIS — I440 Atrioventricular block, first degree: Secondary | ICD-10-CM | POA: Insufficient documentation

## 2012-02-11 DIAGNOSIS — Z951 Presence of aortocoronary bypass graft: Secondary | ICD-10-CM | POA: Insufficient documentation

## 2012-02-11 DIAGNOSIS — Z7902 Long term (current) use of antithrombotics/antiplatelets: Secondary | ICD-10-CM | POA: Insufficient documentation

## 2012-02-11 DIAGNOSIS — E785 Hyperlipidemia, unspecified: Secondary | ICD-10-CM | POA: Insufficient documentation

## 2012-02-11 DIAGNOSIS — Z5189 Encounter for other specified aftercare: Secondary | ICD-10-CM | POA: Insufficient documentation

## 2012-02-11 DIAGNOSIS — I2510 Atherosclerotic heart disease of native coronary artery without angina pectoris: Secondary | ICD-10-CM | POA: Insufficient documentation

## 2012-02-11 DIAGNOSIS — I214 Non-ST elevation (NSTEMI) myocardial infarction: Secondary | ICD-10-CM | POA: Insufficient documentation

## 2012-02-11 LAB — GLUCOSE, CAPILLARY
Glucose-Capillary: 197 mg/dL — ABNORMAL HIGH (ref 70–99)
Glucose-Capillary: 127 mg/dL — ABNORMAL HIGH (ref 70–99)
Glucose-Capillary: 73 mg/dL (ref 70–99)

## 2012-02-14 ENCOUNTER — Encounter (HOSPITAL_COMMUNITY)
Admission: RE | Admit: 2012-02-14 | Discharge: 2012-02-14 | Disposition: A | Discharge status: Home / Self Care | Payer: Medicare Other | Source: Ambulatory Visit | Attending: Cardiology | Admitting: Cardiology

## 2012-02-14 LAB — GLUCOSE, CAPILLARY: Glucose-Capillary: 141 mg/dL — ABNORMAL HIGH (ref 70–99)

## 2012-02-16 ENCOUNTER — Encounter (HOSPITAL_COMMUNITY)
Admission: RE | Admit: 2012-02-16 | Discharge: 2012-02-16 | Disposition: A | Discharge status: Home / Self Care | Payer: Medicare Other | Source: Ambulatory Visit | Attending: Cardiology | Admitting: Cardiology

## 2012-02-18 ENCOUNTER — Encounter (HOSPITAL_COMMUNITY)
Admission: RE | Admit: 2012-02-18 | Discharge: 2012-02-18 | Disposition: A | Discharge status: Home / Self Care | Payer: Medicare Other | Source: Ambulatory Visit | Attending: Cardiology | Admitting: Cardiology

## 2012-02-21 ENCOUNTER — Encounter (HOSPITAL_COMMUNITY)
Admission: RE | Admit: 2012-02-21 | Discharge: 2012-02-21 | Disposition: A | Discharge status: Home / Self Care | Payer: Medicare Other | Source: Ambulatory Visit | Attending: Cardiology | Admitting: Cardiology

## 2012-02-23 ENCOUNTER — Encounter (HOSPITAL_COMMUNITY)
Admission: RE | Admit: 2012-02-23 | Discharge: 2012-02-23 | Disposition: A | Discharge status: Home / Self Care | Payer: Medicare Other | Source: Ambulatory Visit | Attending: Cardiology | Admitting: Cardiology

## 2012-02-25 ENCOUNTER — Encounter (HOSPITAL_COMMUNITY)
Admission: RE | Admit: 2012-02-25 | Discharge: 2012-02-25 | Disposition: A | Discharge status: Home / Self Care | Payer: Medicare Other | Source: Ambulatory Visit | Attending: Cardiology | Admitting: Cardiology

## 2012-02-28 ENCOUNTER — Encounter (HOSPITAL_COMMUNITY)
Admission: RE | Admit: 2012-02-28 | Discharge: 2012-02-28 | Disposition: A | Discharge status: Home / Self Care | Payer: Medicare Other | Source: Ambulatory Visit | Attending: Cardiology | Admitting: Cardiology

## 2012-02-29 ENCOUNTER — Other Ambulatory Visit: Payer: Self-pay | Admitting: *Deleted

## 2012-02-29 MED ORDER — CLOPIDOGREL BISULFATE 75 MG PO TABS: 75.0000 mg | ORAL_TABLET | Freq: Every day | ORAL | Status: DC

## 2012-03-01 ENCOUNTER — Encounter (HOSPITAL_COMMUNITY)
Admission: RE | Admit: 2012-03-01 | Discharge: 2012-03-01 | Disposition: A | Discharge status: Home / Self Care | Payer: Medicare Other | Source: Ambulatory Visit | Attending: Cardiology | Admitting: Cardiology

## 2012-03-03 ENCOUNTER — Encounter (HOSPITAL_COMMUNITY)
Admission: RE | Admit: 2012-03-03 | Discharge: 2012-03-03 | Disposition: A | Discharge status: Home / Self Care | Payer: Medicare Other | Source: Ambulatory Visit | Attending: Cardiology | Admitting: Cardiology

## 2012-03-06 ENCOUNTER — Encounter (HOSPITAL_COMMUNITY)
Admission: RE | Admit: 2012-03-06 | Discharge: 2012-03-06 | Disposition: A | Discharge status: Home / Self Care | Payer: Medicare Other | Source: Ambulatory Visit | Attending: Cardiology | Admitting: Cardiology

## 2012-03-08 ENCOUNTER — Encounter (HOSPITAL_COMMUNITY)
Admission: RE | Admit: 2012-03-08 | Discharge: 2012-03-08 | Disposition: A | Discharge status: Home / Self Care | Payer: Medicare Other | Source: Ambulatory Visit | Attending: Cardiology | Admitting: Cardiology

## 2012-03-13 ENCOUNTER — Encounter (HOSPITAL_COMMUNITY)
Admission: RE | Admit: 2012-03-13 | Discharge: 2012-03-13 | Disposition: A | Discharge status: Home / Self Care | Payer: Medicare Other | Source: Ambulatory Visit | Attending: Cardiology | Admitting: Cardiology

## 2012-03-13 DIAGNOSIS — I498 Other specified cardiac arrhythmias: Secondary | ICD-10-CM | POA: Insufficient documentation

## 2012-03-13 DIAGNOSIS — I440 Atrioventricular block, first degree: Secondary | ICD-10-CM | POA: Insufficient documentation

## 2012-03-13 DIAGNOSIS — F329 Major depressive disorder, single episode, unspecified: Secondary | ICD-10-CM | POA: Insufficient documentation

## 2012-03-13 DIAGNOSIS — Z5189 Encounter for other specified aftercare: Secondary | ICD-10-CM | POA: Insufficient documentation

## 2012-03-13 DIAGNOSIS — Z951 Presence of aortocoronary bypass graft: Secondary | ICD-10-CM | POA: Insufficient documentation

## 2012-03-13 DIAGNOSIS — Z9861 Coronary angioplasty status: Secondary | ICD-10-CM | POA: Insufficient documentation

## 2012-03-13 DIAGNOSIS — F3289 Other specified depressive episodes: Secondary | ICD-10-CM | POA: Insufficient documentation

## 2012-03-13 DIAGNOSIS — Z79899 Other long term (current) drug therapy: Secondary | ICD-10-CM | POA: Insufficient documentation

## 2012-03-13 DIAGNOSIS — I252 Old myocardial infarction: Secondary | ICD-10-CM | POA: Insufficient documentation

## 2012-03-13 DIAGNOSIS — E119 Type 2 diabetes mellitus without complications: Secondary | ICD-10-CM | POA: Insufficient documentation

## 2012-03-13 DIAGNOSIS — I2510 Atherosclerotic heart disease of native coronary artery without angina pectoris: Secondary | ICD-10-CM | POA: Insufficient documentation

## 2012-03-13 DIAGNOSIS — I214 Non-ST elevation (NSTEMI) myocardial infarction: Secondary | ICD-10-CM | POA: Insufficient documentation

## 2012-03-13 DIAGNOSIS — K219 Gastro-esophageal reflux disease without esophagitis: Secondary | ICD-10-CM | POA: Insufficient documentation

## 2012-03-13 DIAGNOSIS — Z7982 Long term (current) use of aspirin: Secondary | ICD-10-CM | POA: Insufficient documentation

## 2012-03-13 DIAGNOSIS — I2582 Chronic total occlusion of coronary artery: Secondary | ICD-10-CM | POA: Insufficient documentation

## 2012-03-13 DIAGNOSIS — Z7902 Long term (current) use of antithrombotics/antiplatelets: Secondary | ICD-10-CM | POA: Insufficient documentation

## 2012-03-13 DIAGNOSIS — I10 Essential (primary) hypertension: Secondary | ICD-10-CM | POA: Insufficient documentation

## 2012-03-13 DIAGNOSIS — E785 Hyperlipidemia, unspecified: Secondary | ICD-10-CM | POA: Insufficient documentation

## 2012-03-15 ENCOUNTER — Encounter (HOSPITAL_COMMUNITY)
Admission: RE | Admit: 2012-03-15 | Discharge: 2012-03-15 | Disposition: A | Discharge status: Home / Self Care | Payer: Medicare Other | Source: Ambulatory Visit | Attending: Cardiology | Admitting: Cardiology

## 2012-03-17 ENCOUNTER — Encounter (HOSPITAL_COMMUNITY)
Admission: RE | Admit: 2012-03-17 | Discharge: 2012-03-17 | Disposition: A | Discharge status: Home / Self Care | Payer: Medicare Other | Source: Ambulatory Visit | Attending: Cardiology | Admitting: Cardiology

## 2012-03-20 ENCOUNTER — Encounter (HOSPITAL_COMMUNITY)
Admission: RE | Admit: 2012-03-20 | Discharge: 2012-03-20 | Disposition: A | Discharge status: Home / Self Care | Payer: Medicare Other | Source: Ambulatory Visit | Attending: Cardiology | Admitting: Cardiology

## 2012-03-21 ENCOUNTER — Encounter: Payer: Self-pay | Admitting: Internal Medicine

## 2012-03-21 ENCOUNTER — Other Ambulatory Visit (INDEPENDENT_AMBULATORY_CARE_PROVIDER_SITE_OTHER): Payer: Medicare Other

## 2012-03-21 ENCOUNTER — Ambulatory Visit (INDEPENDENT_AMBULATORY_CARE_PROVIDER_SITE_OTHER): Payer: Medicare Other | Admitting: Internal Medicine

## 2012-03-21 VITALS — BP 138/80 | HR 62 | Temp 97.6°F | Ht 71.5 in | Wt 211.1 lb

## 2012-03-21 DIAGNOSIS — IMO0001 Reserved for inherently not codable concepts without codable children: Secondary | ICD-10-CM

## 2012-03-21 DIAGNOSIS — E785 Hyperlipidemia, unspecified: Secondary | ICD-10-CM

## 2012-03-21 DIAGNOSIS — J45909 Unspecified asthma, uncomplicated: Secondary | ICD-10-CM

## 2012-03-21 LAB — BASIC METABOLIC PANEL
BUN: 20 mg/dL (ref 6–23)
CO2: 27 mEq/L (ref 19–32)
Calcium: 9.4 mg/dL (ref 8.4–10.5)
Chloride: 103 mEq/L (ref 96–112)
Creatinine, Ser: 1.2 mg/dL (ref 0.4–1.5)
GFR: 61.82 mL/min (ref >60.00)
Glucose, Bld: 111 mg/dL — ABNORMAL HIGH (ref 70–99)
Potassium: 3.9 mEq/L (ref 3.5–5.1)
Sodium: 138 mEq/L (ref 135–145)

## 2012-03-21 LAB — HEMOGLOBIN A1C: Hgb A1c MFr Bld: 6.7 % — ABNORMAL HIGH (ref 4.6–6.5)

## 2012-03-21 NOTE — Assessment & Plan Note (Signed)
stable overall by hx and exam, most recent data reviewed with pt, and pt to continue medical treatment as before Lab Results  Component Value Date   LDLCALC 77 12/21/2011

## 2012-03-21 NOTE — Progress Notes (Signed)
Subjective:    Patient ID: Sean Lozano, male    DOB: [birth year], 78 y.o.   MRN: [id number]  HPI  Here after several events,saw Dr Poole/NS then Dr Murray Hodgkins for neck pain/arm pain and rec'd for ESI, was off the plavix prior howevere with NSTEMI, d/c on asa/plavix sept 11;  No ESI pursued and neck pain essentially resolved;  Since then developed left LBP/LLE  - had MRI 3 days ago per Dr Darrelyn Hillock, to f/u dec 17 after predpack trial - no results here, to f/u dr Myrtis Ser dec 23;  Has not taken the soma recently since ran out for neck pain, does not want further at this time, hoping to get by without.  Declines flu shot since mother always got sick.  Wants to wait on zostavax to next yr with medicare advantage program.   Pt denies fever, wt loss, night sweats, loss of appetite, or other constitutional symptoms.  Pt denies polydipsia, polyuria, or low sugar symptoms such as weakness or confusion improved with po intake.  Pt states overall good compliance with meds, trying to follow lower cholesterol, diabetic diet, wt overall stable but little exercise however.     Needs A1c. Declines PSA.   Pt denies chest pain, increased sob or doe, wheezing, orthopnea, PND, increased LE swelling, palpitations, dizziness or syncope.  Still going through cardiac rehab, enjoys it.      Past Medical History  Diagnosis Date  . CAD (coronary artery disease)     a. CABG x 6 in 1992. b. s/p DES x 2 to SVG -> OM1/OM2 in 09/2010. c. s/p DES to mid LAD 10/2010. d. NSTEMI (off DAPT) s/p PTCA of instent renarrowing of DES in SVG-OM, Recs for lifelong DAPT   . Hyperlipidemia     intolerance to Crestor Zocor Vytorin. He tolerates Pravachol...low HDL  . GERD (gastroesophageal reflux disease)   . Rosacea   . Prostatitis   . Depression   . Benign prostatic hypertrophy   . Asthma   . Paresthesias     successful surgery by Dr. Julio Sicks  . Arthritis   . Diabetes mellitus     type II-diet  . Diverticulosis   . Hx of CABG     1992  .  Ejection fraction     EF 60%, echo, June, 2012  . BARRETTS ESOPHAGUS 06/02/2007  . Bladder neck obstruction 09/29/2010  . SKIN LESION 06/02/2007  . 1St degree AV block   . Sinus bradycardia     asymptomatic   . Cervical disc disease    Past Surgical History  Procedure Date  . C-spine disc surgery 03/2006  . C4-5 surgery 01/2009  . Cardiac catheterization     stent placed  . Coronary artery bypass graft   . Appendectomy   . Tonsillectomy   . Coccyx fracture surgery 1997    reports that he has quit smoking. He started smoking about 57 years ago. He does not have any smokeless tobacco history on file. He reports that he drinks alcohol. He reports that he does not use illicit drugs. family history includes Aneurysm in an unspecified family member; Diabetes in his sister; Heart disease in his father; Hyperlipidemia in an unspecified family member; and Liver cancer in an unspecified family member. Allergies  Allergen Reactions  . Atenolol Other (See Comments)    depression  . Codeine Other (See Comments)    Crazy feeling  . Ezetimibe-Simvastatin Other (See Comments)    Severe hip pain  . Rosuvastatin  Other (See Comments)    Severe hip pain  . Simvastatin Other (See Comments)    Severe hip pain   Current Outpatient Prescriptions on File Prior to Visit  Medication Sig Dispense Refill  . ADVAIR DISKUS 250-50 MCG/DOSE AEPB TAKE 1 PUFF BY MOUTH TWICE A DAY  60 each  3  . albuterol (PROVENTIL HFA) 108 (90 BASE) MCG/ACT inhaler Inhale 2 puffs into the lungs every 6 (six) hours as needed. For shortness of breath or wheezing      . aspirin (ASPIRIN EC) 81 MG EC tablet Take 81 mg by mouth daily.        . Cholecalciferol (VITAMIN D3) 2000 UNITS TABS Take 1 tablet by mouth daily.       . clopidogrel (PLAVIX) 75 MG tablet Take 1 tablet (75 mg total) by mouth daily.  30 tablet  5  . diphenhydrAMINE (BENADRYL) 25 MG tablet Take 25 mg by mouth daily as needed.      Marland Kitchen HYDROcodone-acetaminophen  (NORCO/VICODIN) 5-325 MG per tablet Take 0.5-1 tablets by mouth every 6 (six) hours as needed. For pain      . loratadine (CLARITIN) 10 MG tablet Take 10 mg by mouth daily as needed.      . metoprolol succinate (TOPROL-XL) 25 MG 24 hr tablet TAKE 1/2 TABLET TWICE A DAY  60 tablet  12  . montelukast (SINGULAIR) 10 MG tablet Take 10 mg by mouth at bedtime.      . niacin (NIASPAN) 1000 MG CR tablet Take 1,000 mg by mouth at bedtime. Take with aspirin      . nitroGLYCERIN (NITROLINGUAL) 0.4 MG/SPRAY spray Place 1 spray under the tongue every 5 (five) minutes as needed. 1 spray under tongue every 5 minutes as needed for chest pain (up to 3 doses)      . nitroGLYCERIN (NITROSTAT) 0.4 MG SL tablet Place 0.4 mg under the tongue every 5 (five) minutes as needed. For chest pain      . pantoprazole (PROTONIX) 40 MG tablet Take 1 tablet (40 mg total) by mouth daily.  90 tablet  3  . pravastatin (PRAVACHOL) 40 MG tablet Take 40 mg by mouth every evening.      . Tamsulosin HCl (FLOMAX) 0.4 MG CAPS Take 0.4 mg by mouth daily after supper.       Review of Systems  Constitutional: Negative for diaphoresis and unexpected weight change.  HENT: Negative for tinnitus.   Eyes: Negative for photophobia and visual disturbance.  Respiratory: Negative for choking and stridor.   Gastrointestinal: Negative for vomiting and blood in stool.  Genitourinary: Negative for hematuria and decreased urine volume.  Musculoskeletal: Negative for gait problem.  Skin: Negative for color change and wound.  Neurological: Negative for tremors and numbness.  Psychiatric/Behavioral: Negative for decreased concentration. The patient is not hyperactive.       Objective:   Physical Exam BP 138/80  Pulse 62  Temp 97.6 F (36.4 C) (Oral)  Ht 5' 11.5" (1.816 m)  Wt 211 lb 2 oz (95.766 kg)  BMI 29.04 kg/m2  SpO2 96% Physical Exam  VS noted Constitutional: Pt appears well-developed and well-nourished.  HENT: Head: Normocephalic.   Right Ear: External ear normal.  Left Ear: External ear normal.  Eyes: Conjunctivae and EOM are normal. Pupils are equal, round, and reactive to light.  Neck: Normal range of motion. Neck supple.  Cardiovascular: Normal rate and regular rhythm.   Pulmonary/Chest: Effort normal and breath sounds normal.  Abd:  Soft,  NT, non-distended, + BS Neurological: Pt is alert. Not confused  Skin: Skin is warm. No erythema.  Psychiatric: Pt behavior is normal. Thought content normal.     Assessment & Plan:

## 2012-03-21 NOTE — Assessment & Plan Note (Signed)
stable overall by hx and exam, most recent data reviewed with pt, and pt to continue medical treatment as before Lab Results  Component Value Date   HGBA1C 6.7* 03/21/2012

## 2012-03-21 NOTE — Assessment & Plan Note (Signed)
stable overall by hx and exam, most recent data reviewed with pt, and pt to continue medical treatment as before SpO2 Readings from Last 3 Encounters:  03/21/12 96%  01/13/12 95%  12/22/11 96%

## 2012-03-21 NOTE — Patient Instructions (Addendum)
Continue all other medications as before Please go to LAB in the Basement for the blood and/or urine tests to be done today You will be contacted by phone if any changes need to be made immediately.  Otherwise, you will receive a letter about your results with an explanation, but please check with MyChart first. Please remember to check My Chart at your earliest convenience, as this will be important to you in the future with finding out test results. Please keep your appointments with your specialists as you have planned Please return if you change your mind about the flu shot, or shingles shot Please return in 6 months, or sooner if needed

## 2012-03-22 ENCOUNTER — Encounter (HOSPITAL_COMMUNITY)
Admission: RE | Admit: 2012-03-22 | Discharge: 2012-03-22 | Disposition: A | Discharge status: Home / Self Care | Payer: Medicare Other | Source: Ambulatory Visit | Attending: Cardiology | Admitting: Cardiology

## 2012-03-24 ENCOUNTER — Encounter (HOSPITAL_COMMUNITY): Payer: Medicare Other

## 2012-03-27 ENCOUNTER — Encounter (HOSPITAL_COMMUNITY): Payer: Medicare Other

## 2012-03-29 ENCOUNTER — Encounter (HOSPITAL_COMMUNITY)
Admission: RE | Admit: 2012-03-29 | Discharge: 2012-03-29 | Disposition: A | Discharge status: Home / Self Care | Payer: Medicare Other | Source: Ambulatory Visit | Attending: Cardiology | Admitting: Cardiology

## 2012-03-29 NOTE — Progress Notes (Signed)
Pt reports he received cortisone injection in knee yesterday with relief of discomfort.  Pt able to exercise today without discomfort

## 2012-03-30 ENCOUNTER — Other Ambulatory Visit: Payer: Self-pay | Admitting: Internal Medicine

## 2012-03-31 ENCOUNTER — Encounter: Payer: Self-pay | Admitting: Cardiology

## 2012-03-31 ENCOUNTER — Encounter (HOSPITAL_COMMUNITY)
Admission: RE | Admit: 2012-03-31 | Discharge: 2012-03-31 | Disposition: A | Discharge status: Home / Self Care | Payer: Medicare Other | Source: Ambulatory Visit | Attending: Cardiology | Admitting: Cardiology

## 2012-04-03 ENCOUNTER — Encounter (HOSPITAL_COMMUNITY)
Admission: RE | Admit: 2012-04-03 | Discharge: 2012-04-03 | Disposition: A | Discharge status: Home / Self Care | Payer: Medicare Other | Source: Ambulatory Visit | Attending: Cardiology | Admitting: Cardiology

## 2012-04-03 ENCOUNTER — Encounter: Payer: Self-pay | Admitting: Cardiology

## 2012-04-03 ENCOUNTER — Ambulatory Visit (INDEPENDENT_AMBULATORY_CARE_PROVIDER_SITE_OTHER): Payer: Medicare Other | Admitting: Cardiology

## 2012-04-03 VITALS — BP 126/86 | HR 81 | Ht 71.5 in | Wt 215.0 lb

## 2012-04-03 DIAGNOSIS — I2510 Atherosclerotic heart disease of native coronary artery without angina pectoris: Secondary | ICD-10-CM

## 2012-04-03 DIAGNOSIS — E785 Hyperlipidemia, unspecified: Secondary | ICD-10-CM

## 2012-04-03 NOTE — Assessment & Plan Note (Signed)
Coronary disease is stable since he had balloon angioplasty to the stent to the OM in September, 2013. He will remain on aspirin and Plavix lifelong. This does not mean  that he could never had any type of surgical procedure that requires holding his Plavix. However we would want to be sure that this is absolutely necessary.

## 2012-04-03 NOTE — Assessment & Plan Note (Signed)
Lipids are being treated the best way we can.

## 2012-04-03 NOTE — Patient Instructions (Addendum)
Your physician recommends that you schedule a follow-up appointment in: 4 months  

## 2012-04-03 NOTE — Progress Notes (Signed)
HPI  Patient is seen to followup coronary disease. I had seen him last in May, 2013. He was quite stable. Then in early September he did require repeat catheterization. He underwent POBA to the stent in the graft to the OM. He did not receive another stent. It is recommended that he remain on lifelong aspirin and Plavix. He's been active. He's not having any significant chest pain.  Allergies  Allergen Reactions  . Atenolol Other (See Comments)    depression  . Codeine Other (See Comments)    Crazy feeling  . Ezetimibe-Simvastatin Other (See Comments)    Severe hip pain  . Rosuvastatin Other (See Comments)    Severe hip pain  . Simvastatin Other (See Comments)    Severe hip pain    Current Outpatient Prescriptions  Medication Sig Dispense Refill  . ADVAIR DISKUS 250-50 MCG/DOSE AEPB TAKE 1 PUFF BY MOUTH TWICE A DAY  60 each  3  . albuterol (PROVENTIL HFA) 108 (90 BASE) MCG/ACT inhaler Inhale 2 puffs into the lungs every 6 (six) hours as needed. For shortness of breath or wheezing      . aspirin (ASPIRIN EC) 81 MG EC tablet Take 81 mg by mouth daily.        . Cholecalciferol (VITAMIN D3) 2000 UNITS TABS Take 1 tablet by mouth daily.       . clopidogrel (PLAVIX) 75 MG tablet Take 1 tablet (75 mg total) by mouth daily.  30 tablet  5  . diphenhydrAMINE (BENADRYL) 25 MG tablet Take 25 mg by mouth daily as needed.      Marland Kitchen HYDROcodone-acetaminophen (NORCO/VICODIN) 5-325 MG per tablet Take 0.5-1 tablets by mouth every 6 (six) hours as needed. For pain      . loratadine (CLARITIN) 10 MG tablet Take 10 mg by mouth daily as needed.      . metoprolol succinate (TOPROL-XL) 25 MG 24 hr tablet TAKE 1/2 TABLET TWICE A DAY  60 tablet  12  . montelukast (SINGULAIR) 10 MG tablet TAKE 1 TABLET BY MOUTH AT BEDTIME  30 tablet  0  . niacin (NIASPAN) 1000 MG CR tablet Take 1,000 mg by mouth at bedtime. Take with aspirin      . nitroGLYCERIN (NITROLINGUAL) 0.4 MG/SPRAY spray Place 1 spray under the tongue  every 5 (five) minutes as needed. 1 spray under tongue every 5 minutes as needed for chest pain (up to 3 doses)      . nitroGLYCERIN (NITROSTAT) 0.4 MG SL tablet Place 0.4 mg under the tongue every 5 (five) minutes as needed. For chest pain      . pantoprazole (PROTONIX) 40 MG tablet Take 1 tablet (40 mg total) by mouth daily.  90 tablet  3  . pravastatin (PRAVACHOL) 40 MG tablet Take 40 mg by mouth every evening.      . Tamsulosin HCl (FLOMAX) 0.4 MG CAPS Take 0.4 mg by mouth daily after supper.        History   Social History  . Marital Status: Married    Spouse Name: N/A    Number of Children: 3  . Years of Education: N/A   Occupational History  . retired-real estate    Social History Main Topics  . Smoking status: Former Smoker    Start date: 01/03/1955  . Smokeless tobacco: Not on file  . Alcohol Use: Yes  . Drug Use: No  . Sexually Active: Not on file   Other Topics Concern  . Not on file  Social History Narrative   Pt has 51yo son-paranoid schizophrenic, unempolyed, lives with him.    Family History  Problem Relation Age of Onset  . Heart disease Father     mother  . Aneurysm    . Hyperlipidemia    . Liver cancer      grandfather  . Diabetes Sister     mother    Past Medical History  Diagnosis Date  . CAD (coronary artery disease)     a. CABG x 6 in 1992. b. s/p DES x 2 to SVG -> OM1/OM2 in 09/2010. c. s/p DES to mid LAD 10/2010. d. NSTEMI (off DAPT) s/p PTCA of instent renarrowing of DES in SVG-OM, Recs for lifelong DAPT   . Hyperlipidemia     intolerance to Crestor Zocor Vytorin. He tolerates Pravachol...low HDL  . GERD (gastroesophageal reflux disease)   . Rosacea   . Prostatitis   . Depression   . Benign prostatic hypertrophy   . Asthma   . Paresthesias     successful surgery by Dr. Julio Sicks  . Arthritis   . Diabetes mellitus     type II-diet  . Diverticulosis   . Hx of CABG     1992  . Ejection fraction     EF 60%, echo, June, 2012  .  BARRETTS ESOPHAGUS 06/02/2007  . Bladder neck obstruction 09/29/2010  . SKIN LESION 06/02/2007  . 1St degree AV block   . Sinus bradycardia     asymptomatic   . Cervical disc disease     Past Surgical History  Procedure Date  . C-spine disc surgery 03/2006  . C4-5 surgery 01/2009  . Cardiac catheterization     stent placed  . Coronary artery bypass graft   . Appendectomy   . Tonsillectomy   . Coccyx fracture surgery 1997    Patient Active Problem List  Diagnosis  . DEPRESSION  . ASTHMA  . GERD  . BARRETTS ESOPHAGUS  . BENIGN PROSTATIC HYPERTROPHY  . Rosacea  . SKIN LESION  . FATIGUE  . Preventative health care  . Dyspnea  . Bladder neck obstruction  . Hyperlipidemia  . Type II or unspecified type diabetes mellitus without mention of complication, uncontrolled  . Hx of CABG  . Ejection fraction  . CAD (coronary artery disease)  . Erectile dysfunction  . Acute myocardial infarction, subendocardial infarction, initial episode of care    ROS   Patient denies fever, chills, headache, sweats, rash, change in vision, change in hearing, chest pain, cough, nausea vomiting, urinary symptoms. All other systems are reviewed and are negative.  PHYSICAL EXAM  Patient is stable. There is no jugulovenous distention. Lungs are clear. Respiratory effort is nonlabored. Cardiac exam reveals S1 and S2. There are no clicks or significant murmurs. Abdomen is soft. There's no peripheral edema.  Filed Vitals:   04/03/12 0936  BP: 126/86  Pulse: 81  Height: 5' 11.5" (1.816 m)  Weight: 215 lb (97.523 kg)  SpO2: 95%     ASSESSMENT & PLAN

## 2012-04-06 ENCOUNTER — Other Ambulatory Visit: Payer: Self-pay | Admitting: Internal Medicine

## 2012-04-07 ENCOUNTER — Encounter (HOSPITAL_COMMUNITY)
Admission: RE | Admit: 2012-04-07 | Discharge: 2012-04-07 | Disposition: A | Discharge status: Home / Self Care | Payer: Medicare Other | Source: Ambulatory Visit | Attending: Cardiology | Admitting: Cardiology

## 2012-04-10 ENCOUNTER — Encounter (HOSPITAL_COMMUNITY)
Admission: RE | Admit: 2012-04-10 | Discharge: 2012-04-10 | Disposition: A | Discharge status: Home / Self Care | Payer: Medicare Other | Source: Ambulatory Visit | Attending: Cardiology | Admitting: Cardiology

## 2012-04-11 ENCOUNTER — Other Ambulatory Visit: Payer: Self-pay

## 2012-04-11 MED ORDER — GLUCOSE BLOOD VI STRP: ORAL_STRIP | Status: DC

## 2012-04-12 DIAGNOSIS — H9190 Unspecified hearing loss, unspecified ear: Secondary | ICD-10-CM

## 2012-04-12 HISTORY — DX: Unspecified hearing loss, unspecified ear: H91.90

## 2012-04-14 ENCOUNTER — Encounter (HOSPITAL_COMMUNITY)
Admission: RE | Admit: 2012-04-14 | Discharge: 2012-04-14 | Disposition: A | Discharge status: Home / Self Care | Payer: Medicare Other | Source: Ambulatory Visit | Attending: Cardiology | Admitting: Cardiology

## 2012-04-14 ENCOUNTER — Encounter: Payer: Self-pay | Admitting: Internal Medicine

## 2012-04-14 DIAGNOSIS — Z951 Presence of aortocoronary bypass graft: Secondary | ICD-10-CM | POA: Insufficient documentation

## 2012-04-14 DIAGNOSIS — I440 Atrioventricular block, first degree: Secondary | ICD-10-CM | POA: Insufficient documentation

## 2012-04-14 DIAGNOSIS — E119 Type 2 diabetes mellitus without complications: Secondary | ICD-10-CM | POA: Insufficient documentation

## 2012-04-14 DIAGNOSIS — Z9861 Coronary angioplasty status: Secondary | ICD-10-CM | POA: Insufficient documentation

## 2012-04-14 DIAGNOSIS — E785 Hyperlipidemia, unspecified: Secondary | ICD-10-CM | POA: Insufficient documentation

## 2012-04-14 DIAGNOSIS — Z7982 Long term (current) use of aspirin: Secondary | ICD-10-CM | POA: Insufficient documentation

## 2012-04-14 DIAGNOSIS — I2510 Atherosclerotic heart disease of native coronary artery without angina pectoris: Secondary | ICD-10-CM | POA: Insufficient documentation

## 2012-04-14 DIAGNOSIS — F3289 Other specified depressive episodes: Secondary | ICD-10-CM | POA: Insufficient documentation

## 2012-04-14 DIAGNOSIS — I214 Non-ST elevation (NSTEMI) myocardial infarction: Secondary | ICD-10-CM | POA: Insufficient documentation

## 2012-04-14 DIAGNOSIS — Z7902 Long term (current) use of antithrombotics/antiplatelets: Secondary | ICD-10-CM | POA: Insufficient documentation

## 2012-04-14 DIAGNOSIS — I10 Essential (primary) hypertension: Secondary | ICD-10-CM | POA: Insufficient documentation

## 2012-04-14 DIAGNOSIS — K219 Gastro-esophageal reflux disease without esophagitis: Secondary | ICD-10-CM | POA: Insufficient documentation

## 2012-04-14 DIAGNOSIS — I498 Other specified cardiac arrhythmias: Secondary | ICD-10-CM | POA: Insufficient documentation

## 2012-04-14 DIAGNOSIS — Z79899 Other long term (current) drug therapy: Secondary | ICD-10-CM | POA: Insufficient documentation

## 2012-04-14 DIAGNOSIS — F329 Major depressive disorder, single episode, unspecified: Secondary | ICD-10-CM | POA: Insufficient documentation

## 2012-04-14 DIAGNOSIS — I2582 Chronic total occlusion of coronary artery: Secondary | ICD-10-CM | POA: Insufficient documentation

## 2012-04-14 DIAGNOSIS — I252 Old myocardial infarction: Secondary | ICD-10-CM | POA: Insufficient documentation

## 2012-04-14 DIAGNOSIS — Z5189 Encounter for other specified aftercare: Secondary | ICD-10-CM | POA: Insufficient documentation

## 2012-04-16 ENCOUNTER — Other Ambulatory Visit: Payer: Self-pay | Admitting: Internal Medicine

## 2012-04-17 ENCOUNTER — Other Ambulatory Visit: Payer: Self-pay

## 2012-04-17 ENCOUNTER — Encounter (HOSPITAL_COMMUNITY)
Admission: RE | Admit: 2012-04-17 | Discharge: 2012-04-17 | Disposition: A | Discharge status: Home / Self Care | Payer: Medicare Other | Source: Ambulatory Visit | Attending: Cardiology | Admitting: Cardiology

## 2012-04-17 MED ORDER — GLUCOSE BLOOD VI STRP: ORAL_STRIP | Status: DC

## 2012-04-19 ENCOUNTER — Encounter (HOSPITAL_COMMUNITY): Payer: Self-pay

## 2012-04-19 ENCOUNTER — Encounter (HOSPITAL_COMMUNITY)
Admission: RE | Admit: 2012-04-19 | Discharge: 2012-04-19 | Disposition: A | Discharge status: Home / Self Care | Payer: Medicare Other | Source: Ambulatory Visit | Attending: Cardiology | Admitting: Cardiology

## 2012-04-19 NOTE — Progress Notes (Signed)
Pt graduated from cardiac rehab today.  Pt has made positive lifestyle changes and should be commended for his efforts.  Pt plans to exercise on his own at local [employer] program.

## 2012-04-21 ENCOUNTER — Encounter (HOSPITAL_COMMUNITY): Payer: Medicare Other

## 2012-04-24 ENCOUNTER — Encounter: Payer: Self-pay | Admitting: Cardiology

## 2012-05-10 ENCOUNTER — Other Ambulatory Visit: Payer: Self-pay | Admitting: Internal Medicine

## 2012-05-27 ENCOUNTER — Other Ambulatory Visit: Payer: Self-pay

## 2012-05-30 ENCOUNTER — Encounter: Payer: Self-pay | Admitting: Internal Medicine

## 2012-06-01 ENCOUNTER — Ambulatory Visit (INDEPENDENT_AMBULATORY_CARE_PROVIDER_SITE_OTHER): Payer: 59 | Admitting: Internal Medicine

## 2012-06-01 ENCOUNTER — Encounter: Payer: Self-pay | Admitting: Internal Medicine

## 2012-06-01 VITALS — BP 104/60 | HR 69 | Temp 98.0°F | Ht 71.5 in | Wt 213.4 lb

## 2012-06-01 DIAGNOSIS — E785 Hyperlipidemia, unspecified: Secondary | ICD-10-CM

## 2012-06-01 DIAGNOSIS — J069 Acute upper respiratory infection, unspecified: Secondary | ICD-10-CM

## 2012-06-01 DIAGNOSIS — IMO0001 Reserved for inherently not codable concepts without codable children: Secondary | ICD-10-CM

## 2012-06-01 NOTE — Patient Instructions (Signed)
I believe you do not need more medication for sugar or blood pressure today  Please continue all other medications as before Please continue your efforts at being more active, low cholesterol diet, and weight control.

## 2012-06-01 NOTE — Progress Notes (Signed)
Subjective:    Patient ID: Sean Lozano, male    DOB: [birth year], 79 y.o.   MRN: [id number]  HPI   Here with 2-3 days acute onset fever, facial pain, pressure, headache, general weakness and malaise, and clearish d/c, with mild ST and cough all improved today and pt denies chest pain, wheezing, increased sob or doe, orthopnea, PND, increased LE swelling, palpitations, dizziness or syncope. He notes  UA per [facility] housecall nurse with elev glucose, 3 hrs after bfast. CBG's at home - 100 this am before bfast, 2 hrs post prandial lunch - 128.  Pt denies polydipsia, polyuria, or low sugar symptoms such as weakness or confusion improved with po intake.  Pt states overall good compliance with meds, trying to follow lower cholesterol, diabetic diet, wt overall stable but little exercise however.    Last a1c 6.7 in dec 2013. Not on ACE/ARB but BP has been lower. Past Medical History  Diagnosis Date  . CAD (coronary artery disease)     a. CABG x 6 in 1992. b. s/p DES x 2 to SVG -> OM1/OM2 in 09/2010. c. s/p DES to mid LAD 10/2010. d. NSTEMI (off DAPT) s/p PTCA of instent renarrowing of DES in SVG-OM, Recs for lifelong DAPT   . Hyperlipidemia     intolerance to Crestor Zocor Vytorin. He tolerates Pravachol...low HDL  . GERD (gastroesophageal reflux disease)   . Rosacea   . Prostatitis   . Depression   . Benign prostatic hypertrophy   . Asthma   . Paresthesias     successful surgery by Dr. Julio Sicks  . Arthritis   . Diabetes mellitus     type II-diet  . Diverticulosis   . Hx of CABG     1992  . Ejection fraction     EF 60%, echo, June, 2012  . BARRETTS ESOPHAGUS 06/02/2007  . Bladder neck obstruction 09/29/2010  . SKIN LESION 06/02/2007  . 1St degree AV block   . Sinus bradycardia     asymptomatic   . Cervical disc disease    Past Surgical History  Procedure Laterality Date  . C-spine disc surgery  03/2006  . C4-5 surgery  01/2009  . Cardiac catheterization      stent placed  . Coronary  artery bypass graft    . Appendectomy    . Tonsillectomy    . Coccyx fracture surgery  1997    reports that he has quit smoking. He started smoking about 57 years ago. He does not have any smokeless tobacco history on file. He reports that  drinks alcohol. He reports that he does not use illicit drugs. family history includes Aneurysm in an unspecified family member; Diabetes in his sister; Heart disease in his father; Hyperlipidemia in an unspecified family member; and Liver cancer in an unspecified family member. Allergies  Allergen Reactions  . Atenolol Other (See Comments)    depression  . Codeine Other (See Comments)    Crazy feeling  . Ezetimibe-Simvastatin Other (See Comments)    Severe hip pain  . Rosuvastatin Other (See Comments)    Severe hip pain  . Simvastatin Other (See Comments)    Severe hip pain   Current Outpatient Prescriptions on File Prior to Visit  Medication Sig Dispense Refill  . ADVAIR DISKUS 250-50 MCG/DOSE AEPB TAKE 1 PUFF BY MOUTH TWICE A DAY  60 each  3  . ADVAIR DISKUS 250-50 MCG/DOSE AEPB TAKE 1 PUFF BY MOUTH TWICE A DAY  60 each  3  . albuterol (PROVENTIL HFA) 108 (90 BASE) MCG/ACT inhaler Inhale 2 puffs into the lungs every 6 (six) hours as needed. For shortness of breath or wheezing      . aspirin (ASPIRIN EC) 81 MG EC tablet Take 81 mg by mouth daily.        . Cholecalciferol (VITAMIN D3) 2000 UNITS TABS Take 1 tablet by mouth daily.       . clopidogrel (PLAVIX) 75 MG tablet Take 1 tablet (75 mg total) by mouth daily.  30 tablet  5  . diphenhydrAMINE (BENADRYL) 25 MG tablet Take 25 mg by mouth daily as needed.      Marland Kitchen glucose blood (ONE TOUCH ULTRA TEST) test strip Use as instructed once daily to check blood sugar.  Diagnosis code 250.02  100 each  2  . HYDROcodone-acetaminophen (NORCO/VICODIN) 5-325 MG per tablet Take 0.5-1 tablets by mouth every 6 (six) hours as needed. For pain      . loratadine (CLARITIN) 10 MG tablet Take 10 mg by mouth daily as  needed.      . metoprolol succinate (TOPROL-XL) 25 MG 24 hr tablet TAKE 1/2 TABLET TWICE A DAY  60 tablet  12  . montelukast (SINGULAIR) 10 MG tablet TAKE 1 TABLET BY MOUTH AT BEDTIME  30 tablet  11  . niacin (NIASPAN) 1000 MG CR tablet Take 1,000 mg by mouth at bedtime. Take with aspirin      . nitroGLYCERIN (NITROLINGUAL) 0.4 MG/SPRAY spray Place 1 spray under the tongue every 5 (five) minutes as needed. 1 spray under tongue every 5 minutes as needed for chest pain (up to 3 doses)      . nitroGLYCERIN (NITROSTAT) 0.4 MG SL tablet Place 0.4 mg under the tongue every 5 (five) minutes as needed. For chest pain      . pantoprazole (PROTONIX) 40 MG tablet TAKE 1 TABLET (40 MG TOTAL) BY MOUTH DAILY.  90 tablet  1  . pravastatin (PRAVACHOL) 40 MG tablet Take 40 mg by mouth every evening.      . Tamsulosin HCl (FLOMAX) 0.4 MG CAPS Take 0.4 mg by mouth daily after supper.       No current facility-administered medications on file prior to visit.     Review of Systems  Constitutional: Negative for unexpected weight change, or unusual diaphoresis  HENT: Negative for tinnitus.   Eyes: Negative for photophobia and visual disturbance.  Respiratory: Negative for choking and stridor.   Gastrointestinal: Negative for vomiting and blood in stool.  Genitourinary: Negative for hematuria and decreased urine volume.  Musculoskeletal: Negative for acute joint swelling Skin: Negative for color change and wound.  Neurological: Negative for tremors and numbness other than noted  Psychiatric/Behavioral: Negative for decreased concentration or  hyperactivity.       Objective:   Physical Exam BP 104/60  Pulse 69  Temp(Src) 98 F (36.7 C) (Oral)  Ht 5' 11.5" (1.816 m)  Wt 213 lb 6 oz (96.786 kg)  BMI 29.35 kg/m2  SpO2 96% VS noted, not ill appaering Constitutional: Pt appears well-developed and well-nourished.  HENT: Head: NCAT.  Right Ear: External ear normal.  Left Ear: External ear normal.  Bilat  tm's with mild erythema.  Max sinus areas non tender.  Pharynx with mild erythema, no exudate Eyes: Conjunctivae and EOM are normal. Pupils are equal, round, and reactive to light.  Neck: Normal range of motion. Neck supple.  Cardiovascular: Normal rate and regular rhythm.   Pulmonary/Chest: Effort normal and  breath sounds normal.  Neurological: Pt is alert. Not confused  Skin: Skin is warm. No erythema.  Psychiatric: Pt behavior is normal. Thought content normal.     Assessment & Plan:

## 2012-06-01 NOTE — Assessment & Plan Note (Signed)
Mild, for mucinex,tylenol prn,  to f/u any worsening symptoms or concerns

## 2012-06-01 NOTE — Assessment & Plan Note (Signed)
stable overall by history and exam, recent data reviewed with pt, and pt to continue medical treatment as before,  to f/u any worsening symptoms or concerns Lab Results  Component Value Date   LDLCALC 77 12/21/2011

## 2012-06-01 NOTE — Assessment & Plan Note (Addendum)
stable overall by history and exam, recent data reviewed with pt, and pt to continue medical treatment as before,  to f/u any worsening symptoms or concerns Lab Results  Component Value Date   HGBA1C 6.7* 03/21/2012   Has lower BP on beta blocker in the setting of CAD, I do not feel comfortable adding even low dose arb at this time, pt agrees

## 2012-06-05 ENCOUNTER — Other Ambulatory Visit: Payer: Self-pay | Admitting: Internal Medicine

## 2012-07-09 ENCOUNTER — Emergency Department (HOSPITAL_COMMUNITY): Payer: Medicare Other

## 2012-07-09 ENCOUNTER — Encounter (HOSPITAL_COMMUNITY): Payer: Self-pay | Admitting: Emergency Medicine

## 2012-07-09 ENCOUNTER — Inpatient Hospital Stay (HOSPITAL_COMMUNITY)
Admission: EM | Admit: 2012-07-09 | Discharge: 2012-07-15 | DRG: 083 | Disposition: A | Discharge status: Home / Self Care | Payer: Medicare Other | Attending: Internal Medicine | Admitting: Internal Medicine

## 2012-07-09 DIAGNOSIS — M1990 Unspecified osteoarthritis, unspecified site: Secondary | ICD-10-CM | POA: Diagnosis present

## 2012-07-09 DIAGNOSIS — I5032 Chronic diastolic (congestive) heart failure: Secondary | ICD-10-CM

## 2012-07-09 DIAGNOSIS — S060XAA Concussion with loss of consciousness status unknown, initial encounter: Secondary | ICD-10-CM

## 2012-07-09 DIAGNOSIS — Y92009 Unspecified place in unspecified non-institutional (private) residence as the place of occurrence of the external cause: Secondary | ICD-10-CM

## 2012-07-09 DIAGNOSIS — R079 Chest pain, unspecified: Secondary | ICD-10-CM | POA: Diagnosis not present

## 2012-07-09 DIAGNOSIS — W1789XA Other fall from one level to another, initial encounter: Secondary | ICD-10-CM | POA: Diagnosis present

## 2012-07-09 DIAGNOSIS — F3289 Other specified depressive episodes: Secondary | ICD-10-CM | POA: Diagnosis present

## 2012-07-09 DIAGNOSIS — Z9181 History of falling: Secondary | ICD-10-CM

## 2012-07-09 DIAGNOSIS — IMO0002 Reserved for concepts with insufficient information to code with codable children: Secondary | ICD-10-CM | POA: Diagnosis present

## 2012-07-09 DIAGNOSIS — N400 Enlarged prostate without lower urinary tract symptoms: Secondary | ICD-10-CM | POA: Diagnosis present

## 2012-07-09 DIAGNOSIS — Z87891 Personal history of nicotine dependence: Secondary | ICD-10-CM

## 2012-07-09 DIAGNOSIS — M5090 Cervical disc disorder, unspecified, unspecified cervical region: Secondary | ICD-10-CM | POA: Diagnosis present

## 2012-07-09 DIAGNOSIS — F32A Depression, unspecified: Secondary | ICD-10-CM | POA: Diagnosis present

## 2012-07-09 DIAGNOSIS — S065X9A Traumatic subdural hemorrhage with loss of consciousness of unspecified duration, initial encounter: Principal | ICD-10-CM

## 2012-07-09 DIAGNOSIS — E119 Type 2 diabetes mellitus without complications: Secondary | ICD-10-CM | POA: Diagnosis present

## 2012-07-09 DIAGNOSIS — W19XXXA Unspecified fall, initial encounter: Secondary | ICD-10-CM

## 2012-07-09 DIAGNOSIS — D72829 Elevated white blood cell count, unspecified: Secondary | ICD-10-CM

## 2012-07-09 DIAGNOSIS — I2510 Atherosclerotic heart disease of native coronary artery without angina pectoris: Secondary | ICD-10-CM

## 2012-07-09 DIAGNOSIS — I440 Atrioventricular block, first degree: Secondary | ICD-10-CM | POA: Diagnosis present

## 2012-07-09 DIAGNOSIS — IMO0001 Reserved for inherently not codable concepts without codable children: Secondary | ICD-10-CM

## 2012-07-09 DIAGNOSIS — E785 Hyperlipidemia, unspecified: Secondary | ICD-10-CM

## 2012-07-09 DIAGNOSIS — F329 Major depressive disorder, single episode, unspecified: Secondary | ICD-10-CM | POA: Diagnosis present

## 2012-07-09 DIAGNOSIS — Z9861 Coronary angioplasty status: Secondary | ICD-10-CM

## 2012-07-09 DIAGNOSIS — J9601 Acute respiratory failure with hypoxia: Secondary | ICD-10-CM | POA: Diagnosis present

## 2012-07-09 DIAGNOSIS — E1165 Type 2 diabetes mellitus with hyperglycemia: Secondary | ICD-10-CM | POA: Diagnosis present

## 2012-07-09 DIAGNOSIS — S060X9A Concussion with loss of consciousness of unspecified duration, initial encounter: Secondary | ICD-10-CM

## 2012-07-09 DIAGNOSIS — I209 Angina pectoris, unspecified: Secondary | ICD-10-CM | POA: Diagnosis not present

## 2012-07-09 DIAGNOSIS — I252 Old myocardial infarction: Secondary | ICD-10-CM

## 2012-07-09 DIAGNOSIS — Z951 Presence of aortocoronary bypass graft: Secondary | ICD-10-CM

## 2012-07-09 DIAGNOSIS — S065XAA Traumatic subdural hemorrhage with loss of consciousness status unknown, initial encounter: Secondary | ICD-10-CM

## 2012-07-09 DIAGNOSIS — I25810 Atherosclerosis of coronary artery bypass graft(s) without angina pectoris: Secondary | ICD-10-CM | POA: Diagnosis present

## 2012-07-09 DIAGNOSIS — K219 Gastro-esophageal reflux disease without esophagitis: Secondary | ICD-10-CM

## 2012-07-09 DIAGNOSIS — I509 Heart failure, unspecified: Secondary | ICD-10-CM | POA: Diagnosis present

## 2012-07-09 DIAGNOSIS — J45909 Unspecified asthma, uncomplicated: Secondary | ICD-10-CM | POA: Diagnosis present

## 2012-07-09 DIAGNOSIS — I10 Essential (primary) hypertension: Secondary | ICD-10-CM | POA: Diagnosis present

## 2012-07-09 DIAGNOSIS — Z7982 Long term (current) use of aspirin: Secondary | ICD-10-CM

## 2012-07-09 DIAGNOSIS — I6200 Nontraumatic subdural hemorrhage, unspecified: Secondary | ICD-10-CM

## 2012-07-09 HISTORY — DX: Concussion with loss of consciousness status unknown, initial encounter: S06.0XAA

## 2012-07-09 HISTORY — DX: Concussion with loss of consciousness of unspecified duration, initial encounter: S06.0X9A

## 2012-07-09 HISTORY — DX: Unspecified fall, initial encounter: W19.XXXA

## 2012-07-09 HISTORY — DX: Unspecified place in unspecified non-institutional (private) residence as the place of occurrence of the external cause: Y92.009

## 2012-07-09 LAB — CBC WITH DIFFERENTIAL/PLATELET
Basophils Absolute: 0 10*3/uL (ref 0.0–0.1)
Basophils Relative: 0 % (ref 0–1)
Eosinophils Absolute: 0.2 10*3/uL (ref 0.0–0.7)
Eosinophils Relative: 2 % (ref 0–5)
HCT: 43.9 % (ref 39.0–52.0)
Hemoglobin: 15.7 g/dL (ref 13.0–17.0)
Lymphocytes Relative: 16 % (ref 12–46)
Lymphs Abs: 2 10*3/uL (ref 0.7–4.0)
MCH: 33.8 pg (ref 26.0–34.0)
MCHC: 35.8 g/dL (ref 30.0–36.0)
MCV: 94.6 fL (ref 78.0–100.0)
Monocytes Absolute: 0.8 10*3/uL (ref 0.1–1.0)
Monocytes Relative: 6 % (ref 3–12)
Neutro Abs: 9.9 10*3/uL — ABNORMAL HIGH (ref 1.7–7.7)
Neutrophils Relative %: 76 % (ref 43–77)
Platelets: 151 10*3/uL (ref 150–400)
RBC: 4.64 MIL/uL (ref 4.22–5.81)
RDW: 13 % (ref 11.5–15.5)
WBC: 13 10*3/uL — ABNORMAL HIGH (ref 4.0–10.5)

## 2012-07-09 LAB — URINALYSIS, ROUTINE W REFLEX MICROSCOPIC
Bilirubin Urine: NEGATIVE
Glucose, UA: NEGATIVE mg/dL
Hgb urine dipstick: NEGATIVE
Ketones, ur: NEGATIVE mg/dL
Leukocytes, UA: NEGATIVE
Nitrite: NEGATIVE
Protein, ur: NEGATIVE mg/dL
Specific Gravity, Urine: 1.014 (ref 1.005–1.030)
Urobilinogen, UA: 1 mg/dL (ref 0.0–1.0)
pH: 7.5 (ref 5.0–8.0)

## 2012-07-09 LAB — BASIC METABOLIC PANEL
BUN: 13 mg/dL (ref 6–23)
CO2: 25 mEq/L (ref 19–32)
Calcium: 9.1 mg/dL (ref 8.4–10.5)
Chloride: 99 mEq/L (ref 96–112)
Creatinine, Ser: 1.11 mg/dL (ref 0.50–1.35)
GFR calc Af Amer: 72 mL/min — ABNORMAL LOW (ref >90)
GFR calc non Af Amer: 62 mL/min — ABNORMAL LOW (ref >90)
Glucose, Bld: 167 mg/dL — ABNORMAL HIGH (ref 70–99)
Potassium: 3.9 mEq/L (ref 3.5–5.1)
Sodium: 135 mEq/L (ref 135–145)

## 2012-07-09 LAB — GLUCOSE, CAPILLARY
Glucose-Capillary: 200 mg/dL — ABNORMAL HIGH (ref 70–99)
Glucose-Capillary: 154 mg/dL — ABNORMAL HIGH (ref 70–99)
Glucose-Capillary: 197 mg/dL — ABNORMAL HIGH (ref 70–99)

## 2012-07-09 LAB — PROTIME-INR
INR: 1.05 (ref 0.00–1.49)
Prothrombin Time: 13.6 seconds (ref 11.6–15.2)

## 2012-07-09 LAB — POCT I-STAT TROPONIN I: Troponin i, poc: 0 ng/mL (ref 0.00–0.08)

## 2012-07-09 MED ORDER — TAMSULOSIN HCL 0.4 MG PO CAPS
0.4000 mg | ORAL_CAPSULE | Freq: Every day | ORAL | Status: DC
  Administered 2012-07-10 – 2012-07-14 (×5): 0.4 mg via ORAL
  Filled 2012-07-09 (×6): qty 1

## 2012-07-09 MED ORDER — ONDANSETRON HCL 4 MG/2ML IJ SOLN
4.0000 mg | Freq: Four times a day (QID) | INTRAMUSCULAR | Status: DC | PRN
  Administered 2012-07-10 – 2012-07-11 (×2): 4 mg via INTRAVENOUS
  Filled 2012-07-09 (×2): qty 2

## 2012-07-09 MED ORDER — HYDROCODONE-ACETAMINOPHEN 5-325 MG PO TABS: 1.0000 | ORAL_TABLET | ORAL | Status: DC | PRN

## 2012-07-09 MED ORDER — SODIUM CHLORIDE 0.9 % IV SOLN
INTRAVENOUS | Status: AC
  Administered 2012-07-09: 17:00:00 via INTRAVENOUS

## 2012-07-09 MED ORDER — MOMETASONE FURO-FORMOTEROL FUM 100-5 MCG/ACT IN AERO
2.0000 | INHALATION_SPRAY | Freq: Two times a day (BID) | RESPIRATORY_TRACT | Status: DC
  Administered 2012-07-09 – 2012-07-15 (×11): 2 via RESPIRATORY_TRACT
  Filled 2012-07-09 (×2): qty 8.8

## 2012-07-09 MED ORDER — ACETAMINOPHEN 325 MG PO TABS
650.0000 mg | ORAL_TABLET | Freq: Once | ORAL | Status: AC
  Administered 2012-07-09: 650 mg via ORAL
  Filled 2012-07-09 (×2): qty 2

## 2012-07-09 MED ORDER — NIACIN ER 500 MG PO CPCR
1000.0000 mg | ORAL_CAPSULE | Freq: Every day | ORAL | Status: DC
  Administered 2012-07-09 – 2012-07-14 (×6): 1000 mg via ORAL
  Filled 2012-07-09 (×8): qty 2

## 2012-07-09 MED ORDER — INSULIN ASPART 100 UNIT/ML ~~LOC~~ SOLN: 0.0000 [IU] | Freq: Every day | SUBCUTANEOUS | Status: DC

## 2012-07-09 MED ORDER — ALBUTEROL SULFATE HFA 108 (90 BASE) MCG/ACT IN AERS: 2.0000 | INHALATION_SPRAY | Freq: Four times a day (QID) | RESPIRATORY_TRACT | Status: DC | PRN

## 2012-07-09 MED ORDER — ALUM & MAG HYDROXIDE-SIMETH 200-200-20 MG/5ML PO SUSP: 30.0000 mL | Freq: Four times a day (QID) | ORAL | Status: DC | PRN

## 2012-07-09 MED ORDER — PANTOPRAZOLE SODIUM 40 MG PO TBEC
40.0000 mg | DELAYED_RELEASE_TABLET | Freq: Every day | ORAL | Status: DC
  Administered 2012-07-10 – 2012-07-15 (×6): 40 mg via ORAL
  Filled 2012-07-09 (×6): qty 1

## 2012-07-09 MED ORDER — HYDROMORPHONE HCL PF 1 MG/ML IJ SOLN
1.0000 mg | INTRAMUSCULAR | Status: DC | PRN
  Administered 2012-07-10 – 2012-07-11 (×5): 1 mg via INTRAVENOUS
  Filled 2012-07-09 (×5): qty 1

## 2012-07-09 MED ORDER — ONDANSETRON HCL 4 MG/2ML IJ SOLN
4.0000 mg | Freq: Three times a day (TID) | INTRAMUSCULAR | Status: DC | PRN
  Administered 2012-07-09: 4 mg via INTRAVENOUS
  Filled 2012-07-09: qty 2

## 2012-07-09 MED ORDER — SODIUM CHLORIDE 0.9 % IV SOLN
INTRAVENOUS | Status: DC
  Administered 2012-07-09 – 2012-07-10 (×2): via INTRAVENOUS

## 2012-07-09 MED ORDER — INSULIN ASPART 100 UNIT/ML ~~LOC~~ SOLN
0.0000 [IU] | Freq: Three times a day (TID) | SUBCUTANEOUS | Status: DC
  Administered 2012-07-10: 2 [IU] via SUBCUTANEOUS
  Administered 2012-07-11 (×2): 1 [IU] via SUBCUTANEOUS
  Administered 2012-07-11: 2 [IU] via SUBCUTANEOUS
  Administered 2012-07-12: 1 [IU] via SUBCUTANEOUS
  Administered 2012-07-13: 2 [IU] via SUBCUTANEOUS
  Administered 2012-07-13 – 2012-07-15 (×5): 1 [IU] via SUBCUTANEOUS

## 2012-07-09 MED ORDER — PRAVASTATIN SODIUM 40 MG PO TABS
40.0000 mg | ORAL_TABLET | Freq: Every day | ORAL | Status: DC
  Administered 2012-07-09 – 2012-07-14 (×6): 40 mg via ORAL
  Filled 2012-07-09 (×7): qty 1

## 2012-07-09 MED ORDER — NIACIN ER (ANTIHYPERLIPIDEMIC) 500 MG PO TBCR
1000.0000 mg | EXTENDED_RELEASE_TABLET | Freq: Every day | ORAL | Status: DC
Start: 2012-07-09 — End: 2012-07-09
  Filled 2012-07-09: qty 2

## 2012-07-09 MED ORDER — VITAMIN D3 50 MCG (2000 UT) PO TABS: 1.0000 | ORAL_TABLET | Freq: Every day | ORAL | Status: DC

## 2012-07-09 MED ORDER — ONDANSETRON HCL 4 MG PO TABS: 4.0000 mg | ORAL_TABLET | Freq: Four times a day (QID) | ORAL | Status: DC | PRN

## 2012-07-09 MED ORDER — FENTANYL CITRATE 0.05 MG/ML IJ SOLN
50.0000 ug | Freq: Once | INTRAMUSCULAR | Status: AC
  Administered 2012-07-09: 50 ug via INTRAVENOUS
  Filled 2012-07-09: qty 2

## 2012-07-09 MED ORDER — VITAMIN D3 25 MCG (1000 UNIT) PO TABS
1000.0000 [IU] | ORAL_TABLET | Freq: Every day | ORAL | Status: DC
  Administered 2012-07-10 – 2012-07-15 (×6): 1000 [IU] via ORAL
  Filled 2012-07-09 (×6): qty 1

## 2012-07-09 MED ORDER — MONTELUKAST SODIUM 10 MG PO TABS
10.0000 mg | ORAL_TABLET | Freq: Every day | ORAL | Status: DC
  Administered 2012-07-09 – 2012-07-14 (×6): 10 mg via ORAL
  Filled 2012-07-09 (×8): qty 1

## 2012-07-09 MED ORDER — ACETAMINOPHEN 650 MG RE SUPP: 650.0000 mg | Freq: Four times a day (QID) | RECTAL | Status: DC | PRN

## 2012-07-09 MED ORDER — SODIUM CHLORIDE 0.9 % IV SOLN
INTRAVENOUS | Status: DC
  Administered 2012-07-10: 06:00:00 via INTRAVENOUS

## 2012-07-09 MED ORDER — ACETAMINOPHEN 325 MG PO TABS
650.0000 mg | ORAL_TABLET | Freq: Four times a day (QID) | ORAL | Status: DC | PRN
  Administered 2012-07-12 – 2012-07-14 (×3): 650 mg via ORAL
  Filled 2012-07-09 (×3): qty 2

## 2012-07-09 MED ORDER — METOPROLOL SUCCINATE 12.5 MG HALF TABLET
12.5000 mg | ORAL_TABLET | Freq: Two times a day (BID) | ORAL | Status: DC
  Administered 2012-07-09 – 2012-07-15 (×12): 12.5 mg via ORAL
  Filled 2012-07-09 (×15): qty 1

## 2012-07-09 NOTE — ED Provider Notes (Signed)
Medical screening examination/treatment/procedure(s) were conducted as a shared visit with non-physician practitioner(s) and myself.  I personally evaluated the patient during the encounter  Pt seen and examined --neuro intact--will be admitted for observation  Toy Baker, MD 07/09/12 1539

## 2012-07-09 NOTE — Consult Note (Signed)
Reason for Consult: dual-antiplatelet therapy in setting of SDH Referring Physician: Dr. Rulon Abide Sean Lozano is an 79 y.o. male.  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 Sean Lozano does not remember anything until paramedics arrived approximately 15 minutes after his fall around 13:xx today. His wife was home and found him/heard him fall so she called 911. He has also had a fall on his bottom approximately 2 weeks ago on the ice and last Tuesday, 3/25 on his bottom and hit his head but no further symptoms. He is compliant with all of his medications and he took his plavix this AM 3/30 and last aspirin 3/29 PM (typical timing of dosing for him). He's had no chest pain, no syncope, no orthopnea or PND. He is working out at least 3 days and at times upwards of 5x weekly at the gym in "silver sneakers" class learning new movements. He routinely walks at 3 mph @ 1-2% grade without SOB/difficulty for 10-15 minutes before or after his weight-lifting. He does not climb stairs often but he can make it up 1 flight without difficulty. He does not endorse instability but has had a few slips and falls over the last few weeks. Dr. Isidoro Donning asked Cardiology to see Sean Lozano regarding his Dual-antiplatelet therapy in the setting of a SDH. Neurosurgery to formally evaluate him. He is followed by Dr. Myrtis Ser but knows many [facility] physicians.   Past Medical History  Diagnosis Date  . CAD (coronary artery disease)     a. CABG x 6 in 1992. b. s/p DES x 2 to SVG -> OM1/OM2 in 09/2010. c. s/p DES to mid LAD 10/2010. d. NSTEMI (off DAPT) s/p PTCA of instent renarrowing of DES in SVG-OM, Recs for lifelong DAPT   . Hyperlipidemia     intolerance to Crestor Zocor Vytorin. He tolerates Pravachol...low HDL  . GERD (gastroesophageal reflux disease)   .  Rosacea   . Prostatitis   . Depression   . Benign prostatic hypertrophy   . Asthma   . Paresthesias     successful surgery by Dr. Julio Sicks  . Arthritis   . Diabetes mellitus     type II-diet  . Diverticulosis   . Hx of CABG     1992  . Ejection fraction     EF 60%, echo, June, 2012  . BARRETTS ESOPHAGUS 06/02/2007  . Bladder neck obstruction 09/29/2010  . SKIN LESION 06/02/2007  . 1St degree AV block   . Sinus bradycardia     asymptomatic   . Cervical disc disease     Past Surgical History  Procedure Laterality Date  . C-spine disc surgery  03/2006  . C4-5 surgery  01/2009  . Cardiac catheterization      stent placed  . Coronary artery bypass graft    . Appendectomy    . Tonsillectomy    . Coccyx fracture surgery  1997    Family History  Problem Relation Age of Onset  . Heart disease Father     mother  . Aneurysm    . Hyperlipidemia    . Liver cancer      grandfather  . Diabetes Sister     mother    Social History:  reports that he has quit smoking. He started smoking  about 57 years ago. He does not have any smokeless tobacco history on file. He reports that  drinks alcohol. He reports that he does not use illicit drugs.  Allergies:  Allergies  Allergen Reactions  . Atenolol Other (See Comments)    depression  . Codeine Other (See Comments)    Crazy feeling  . Ezetimibe-Simvastatin Other (See Comments)    Severe hip pain  . Rosuvastatin Other (See Comments)    Severe hip pain  . Simvastatin Other (See Comments)    Severe hip pain    Medications:  I have reviewed the patient's current medications. Prior to Admission:  Prescriptions prior to admission  Medication Sig Dispense Refill  . albuterol (PROVENTIL HFA) 108 (90 BASE) MCG/ACT inhaler Inhale 2 puffs into the lungs every 6 (six) hours as needed. For shortness of breath or wheezing      . aspirin (ASPIRIN EC) 81 MG EC tablet Take 81 mg by mouth daily.        . Cholecalciferol (VITAMIN D3) 2000  UNITS TABS Take 1 tablet by mouth daily.       . clopidogrel (PLAVIX) 75 MG tablet Take 1 tablet (75 mg total) by mouth daily.  30 tablet  5  . diphenhydrAMINE (BENADRYL) 25 MG tablet Take 25 mg by mouth daily as needed for allergies.       . Fluticasone-Salmeterol (ADVAIR DISKUS) 250-50 MCG/DOSE AEPB Inhale 1 puff into the lungs every 12 (twelve) hours.      Marland Kitchen glucose blood (ONE TOUCH ULTRA TEST) test strip Use as instructed once daily to check blood sugar.  Diagnosis code 250.02  100 each  2  . loratadine (CLARITIN) 10 MG tablet Take 10 mg by mouth daily as needed for allergies.       . metoprolol succinate (TOPROL-XL) 25 MG 24 hr tablet Take 12.5 mg by mouth 2 (two) times daily.      . montelukast (SINGULAIR) 10 MG tablet TAKE 1 TABLET BY MOUTH AT BEDTIME  30 tablet  11  . niacin (NIASPAN) 1000 MG CR tablet Take 1,000 mg by mouth at bedtime. Take with aspirin      . nitroGLYCERIN (NITROLINGUAL) 0.4 MG/SPRAY spray Place 1 spray under the tongue every 5 (five) minutes as needed for chest pain. 1 spray under tongue every 5 minutes as needed for chest pain (up to 3 doses)      . nitroGLYCERIN (NITROSTAT) 0.4 MG SL tablet Place 0.4 mg under the tongue every 5 (five) minutes as needed. For chest pain      . pantoprazole (PROTONIX) 40 MG tablet TAKE 1 TABLET (40 MG TOTAL) BY MOUTH DAILY.  90 tablet  1  . pantoprazole (PROTONIX) 40 MG tablet Take 40 mg by mouth daily.      . pravastatin (PRAVACHOL) 40 MG tablet Take 40 mg by mouth every evening.      . tamsulosin (FLOMAX) 0.4 MG CAPS Take 0.4 mg by mouth daily after supper.       Scheduled: . sodium chloride   Intravenous STAT  . [START ON 07/10/2012] cholecalciferol  1,000 Units Oral Daily  . insulin aspart  0-5 Units Subcutaneous QHS  . [START ON 07/10/2012] insulin aspart  0-9 Units Subcutaneous TID WC  . metoprolol succinate  12.5 mg Oral BID  . mometasone-formoterol  2 puff Inhalation BID  . montelukast  10 mg Oral QHS  . niacin  1,000 mg Oral  QHS  . [START ON 07/10/2012] pantoprazole  40 mg  Oral Daily  . [START ON 07/10/2012] tamsulosin  0.4 mg Oral QPC supper    Results for orders placed during the hospital encounter of 07/09/12 (from the past 48 hour(s))  CBC WITH DIFFERENTIAL     Status: Abnormal   Collection Time    07/09/12  2:30 PM      Result Value Range   WBC 13.0 (*) 4.0 - 10.5 K/uL   RBC 4.64  4.22 - 5.81 MIL/uL   Hemoglobin 15.7  13.0 - 17.0 g/dL   HCT 16.1  09.6 - 04.5 %   MCV 94.6  78.0 - 100.0 fL   MCH 33.8  26.0 - 34.0 pg   MCHC 35.8  30.0 - 36.0 g/dL   RDW 40.9  81.1 - 91.4 %   Platelets 151  150 - 400 K/uL   Neutrophils Relative 76  43 - 77 %   Neutro Abs 9.9 (*) 1.7 - 7.7 K/uL   Lymphocytes Relative 16  12 - 46 %   Lymphs Abs 2.0  0.7 - 4.0 K/uL   Monocytes Relative 6  3 - 12 %   Monocytes Absolute 0.8  0.1 - 1.0 K/uL   Eosinophils Relative 2  0 - 5 %   Eosinophils Absolute 0.2  0.0 - 0.7 K/uL   Basophils Relative 0  0 - 1 %   Basophils Absolute 0.0  0.0 - 0.1 K/uL  BASIC METABOLIC PANEL     Status: Abnormal   Collection Time    07/09/12  2:30 PM      Result Value Range   Sodium 135  135 - 145 mEq/L   Potassium 3.9  3.5 - 5.1 mEq/L   Chloride 99  96 - 112 mEq/L   CO2 25  19 - 32 mEq/L   Glucose, Bld 167 (*) 70 - 99 mg/dL   BUN 13  6 - 23 mg/dL   Creatinine, Ser 7.82  0.50 - 1.35 mg/dL   Calcium 9.1  8.4 - 95.6 mg/dL   GFR calc non Af Amer 62 (*) >90 mL/min   GFR calc Af Amer 72 (*) >90 mL/min   Comment:            The eGFR has been calculated     using the CKD EPI equation.     This calculation has not been     validated in all clinical     situations.     eGFR's persistently     <90 mL/min signify     possible Chronic Kidney Disease.  PROTIME-INR     Status: None   Collection Time    07/09/12  2:30 PM      Result Value Range   Prothrombin Time 13.6  11.6 - 15.2 seconds   INR 1.05  0.00 - 1.49  GLUCOSE, CAPILLARY     Status: Abnormal   Collection Time    07/09/12  2:44 PM       Result Value Range   Glucose-Capillary 200 (*) 70 - 99 mg/dL  POCT I-STAT TROPONIN I     Status: None   Collection Time    07/09/12  2:45 PM      Result Value Range   Troponin i, poc 0.00  0.00 - 0.08 ng/mL   Comment 3            Comment: Due to the release kinetics of cTnI,     a negative result within the first hours     of  the onset of symptoms does not rule out     myocardial infarction with certainty.     If myocardial infarction is still suspected,     repeat the test at appropriate intervals.  URINALYSIS, ROUTINE W REFLEX MICROSCOPIC     Status: None   Collection Time    07/09/12  6:17 PM      Result Value Range   Color, Urine YELLOW  YELLOW   APPearance CLEAR  CLEAR   Specific Gravity, Urine 1.014  1.005 - 1.030   pH 7.5  5.0 - 8.0   Glucose, UA NEGATIVE  NEGATIVE mg/dL   Hgb urine dipstick NEGATIVE  NEGATIVE   Bilirubin Urine NEGATIVE  NEGATIVE   Ketones, ur NEGATIVE  NEGATIVE mg/dL   Protein, ur NEGATIVE  NEGATIVE mg/dL   Urobilinogen, UA 1.0  0.0 - 1.0 mg/dL   Nitrite NEGATIVE  NEGATIVE   Leukocytes, UA NEGATIVE  NEGATIVE   Comment: MICROSCOPIC NOT DONE ON URINES WITH NEGATIVE PROTEIN, BLOOD, LEUKOCYTES, NITRITE, OR GLUCOSE <1000 mg/dL.    Dg Thoracic Spine 2 View  07/09/2012  *RADIOLOGY REPORT*  Clinical Data: Back pain status post fall today.  THORACIC SPINE - 2 VIEW  Comparison: Chest radiographs 12/20/2011.  Findings: Patient is status post multilevel cervical fusion.  The thoracic alignment is stable with a mild convex right scoliosis. There is no evidence of acute fracture or paraspinal abnormality. Multiple paraspinal osteophytes are noted.  Patient is status post CABG.  Coronary artery stents are also noted.  IMPRESSION: No evidence of acute thoracic spine injury.   Original Report Authenticated By: Carey Bullocks, M.D.    Ct Head Wo Contrast  07/09/2012  *RADIOLOGY REPORT*  Clinical Data:  Altered mental status after falling today.  CT HEAD WITHOUT CONTRAST CT  CERVICAL SPINE WITHOUT CONTRAST  Technique:  Multidetector CT imaging of the head and cervical spine was performed following the standard protocol without intravenous contrast.  Multiplanar CT image reconstructions of the cervical spine were also generated.  Comparison:  05/23/2007.  CT HEAD  Findings: Right tentorial and parafalcine subdural hematoma, measuring 8 mm in maximum thickness on image number 21.  No associated mass effect.  Enlarged ventricles and subarachnoid spaces.  Patchy white matter low density in both cerebral hemispheres.  No skull fractures or paranasal sinus air-fluid levels.  Minimal left sphenoid sinus mucosal thickening.  IMPRESSION:  1.  Right tentorial and parafalcine subdural hematoma, as described above. 2.  Minimally progressive atrophy and chronic small vessel white matter ischemic changes. 3.  Minimal chronic sphenoid sinusitis.  CT CERVICAL SPINE  Findings: Interbody bone plug and anterior screw and plate fixation at the C4-C6 levels with normal alignment.  Solid interbody bone plug anterior fusion at the C3-4 level.  Anterior and posterior bony bridging at the C2-3 level.  Anterior and posterior spur formation at the C6-7 level and anterior spurs at the C7-T1 and T1- T2 levels.  Facet degenerative changes at multiple levels.  No prevertebral soft tissue swelling, fractures or subluxations. Bilateral carotid artery atheromatous calcifications.  IMPRESSION:  1.  No fracture or subluxation. 2.  Postoperative and degenerative changes, as described above. 3.  Bilateral carotid artery atheromatous calcifications.  Critical Value/emergent results were called by telephone at the time of interpretation on 07/09/2012 at 1514 hours to Dr. Laveda Norman, who verbally acknowledged these results.   Original Report Authenticated By: Beckie Salts, M.D.    Ct Cervical Spine Wo Contrast  07/09/2012  *RADIOLOGY REPORT*  Clinical Data:  Altered mental status after falling today.  CT HEAD WITHOUT CONTRAST CT  CERVICAL SPINE WITHOUT CONTRAST  Technique:  Multidetector CT imaging of the head and cervical spine was performed following the standard protocol without intravenous contrast.  Multiplanar CT image reconstructions of the cervical spine were also generated.  Comparison:  05/23/2007.  CT HEAD  Findings: Right tentorial and parafalcine subdural hematoma, measuring 8 mm in maximum thickness on image number 21.  No associated mass effect.  Enlarged ventricles and subarachnoid spaces.  Patchy white matter low density in both cerebral hemispheres.  No skull fractures or paranasal sinus air-fluid levels.  Minimal left sphenoid sinus mucosal thickening.  IMPRESSION:  1.  Right tentorial and parafalcine subdural hematoma, as described above. 2.  Minimally progressive atrophy and chronic small vessel white matter ischemic changes. 3.  Minimal chronic sphenoid sinusitis.  CT CERVICAL SPINE  Findings: Interbody bone plug and anterior screw and plate fixation at the C4-C6 levels with normal alignment.  Solid interbody bone plug anterior fusion at the C3-4 level.  Anterior and posterior bony bridging at the C2-3 level.  Anterior and posterior spur formation at the C6-7 level and anterior spurs at the C7-T1 and T1- T2 levels.  Facet degenerative changes at multiple levels.  No prevertebral soft tissue swelling, fractures or subluxations. Bilateral carotid artery atheromatous calcifications.  IMPRESSION:  1.  No fracture or subluxation. 2.  Postoperative and degenerative changes, as described above. 3.  Bilateral carotid artery atheromatous calcifications.  Critical Value/emergent results were called by telephone at the time of interpretation on 07/09/2012 at 1514 hours to Dr. Laveda Norman, who verbally acknowledged these results.   Original Report Authenticated By: Beckie Salts, M.D.    Dg Shoulder Left  07/09/2012  *RADIOLOGY REPORT*  Clinical Data: Shoulder pain.  Fall.  LEFT SHOULDER - 2+ VIEW  Comparison: Cervical spine CT  07/09/2012  Findings: The mineralization and alignment are normal.  There is no evidence of acute fracture or dislocation.  The subacromial space is preserved.  There are moderate glenohumeral degenerative changes.  Patient is status post lower cervical fusion.  The left C6 screw is absent.  IMPRESSION: No acute osseous findings.  Moderate glenohumeral degenerative changes.   Original Report Authenticated By: Carey Bullocks, M.D.     Review of Systems  Constitutional: Negative for fever, chills, weight loss and malaise/fatigue.  HENT: Negative for hearing loss, ear pain, nosebleeds, tinnitus and ear discharge.   Eyes: Negative for blurred vision, double vision, photophobia and pain.  Respiratory: Negative for cough, hemoptysis and sputum production.   Cardiovascular: Negative for chest pain, palpitations, orthopnea and claudication.  Gastrointestinal: Negative for heartburn, nausea, vomiting and abdominal pain.  Genitourinary: Negative for dysuria, urgency and frequency.  Musculoskeletal: Positive for joint pain and falls. Negative for myalgias.       Left shoulder pain  Skin: Negative for itching and rash.  Neurological: Positive for loss of consciousness and headaches. Negative for dizziness, tingling, tremors, speech change and focal weakness.       Some lightheadedness. + LOC after fall this early afternoon ~ 1 pm  Endo/Heme/Allergies: Negative for environmental allergies. Does not bruise/bleed easily.  Psychiatric/Behavioral: Negative for depression, suicidal ideas and substance abuse.   Blood pressure 130/73, pulse 69, temperature 97.9 F (36.6 C), temperature source Oral, resp. rate 17, SpO2 94.00%. Physical Exam  Nursing note and vitals reviewed. Constitutional: He is oriented to person, place, and time. He appears well-developed and well-nourished. No distress.  HENT:  Head: Normocephalic and  atraumatic.  Nose: Nose normal.  Mouth/Throat: Oropharynx is clear and moist. No  oropharyngeal exudate.  Eyes: Conjunctivae and EOM are normal. Pupils are equal, round, and reactive to light. No scleral icterus.  Neck: Normal range of motion. Neck supple. No JVD present. No tracheal deviation present. No thyromegaly present.  Cardiovascular: Normal rate, regular rhythm, normal heart sounds and intact distal pulses.  Exam reveals no gallop.   No murmur heard. Respiratory: Effort normal and breath sounds normal. No respiratory distress. He has no wheezes. He has no rales.  GI: Soft. Bowel sounds are normal. He exhibits no distension. There is no tenderness. There is no rebound.  Musculoskeletal: Normal range of motion. He exhibits no edema and no tenderness.  Neurological: He is alert and oriented to person, place, and time. No cranial nerve deficit. He exhibits normal muscle tone. Coordination normal.  Skin: Skin is warm and dry. No rash noted. He is not diaphoretic. No erythema.  Psychiatric: He has a normal mood and affect. His behavior is normal.   Labs reviewed; wbc 13, h/h 15.7/43, plt 151, na 135, K 3.9, bun/cr 13/1.1, inr 1.05 ECG reviewed; QRS 102, HR 74, 1st degree AV block, sinus rhythm CTH reviewed: right tentorial/parafalcine SDH Echo results reviewed from 6/12: EF 60-65%, Grade I DD  Problem List Subdural hematoma 8 mm after fall from 3 foot stool (8-9 feet potential fall) on aspirin/plavix Fall/LOC as above Coronary artery disease with prior MI requiring OM POBA 9/13 after being off asa/plavix x 5 days before a potential epidural injection GERD Dyslipidemia Prior CABG '92 Asthma/Obstructive Lung disease, prior tobacco use   Assessment/Plan: 79 yo man with CAD s/p CABG '92, multiple stents, most recently POBA of OM after holding asa/plavix x 5 days for a planned epidural injection, GERD, dyslipidemia overall doing very well who fell from 3 foot ladder/stool with loss of consciousness and found to have 8 mm right tentorial/paraflacine SDH. Currently, Mr.  Golladay has no neurological symptoms since his brief loss of consciousness with his fall around 13:00 today. He has mild headache and some mild lightheadedness. No vision changes or blurry vision. Neurosurgery to formally evaluate with plans for repeat CTH at some point along with holding aspirin/plavix. Last plavix this Am 3/30, last aspirin evening 3/29. Plan to hold aspirin/plavix while determining stability of SDH. Aspirin will be in his system for upwards of 7 days and plavix upwards of 4-5 days so in the event of clinical instability, then platelet transfusion would be warranted. Otherwise, continue toprol XL as HR/BP allow and pravastatin. Follow neuro exam, low threshold to repeat CTH with any change in symptoms. There is a risk of MI and/or instent restenosis while holding dual-antiplatelet therapy as I discussed with Mr. Dirosa and his family but at this juncture, there is a much higher of expansion of his subdural hematoma. His last stent was actually in late 2012 so he is greater than 1 year after stent placement but his current medical plan is indefinite dual-antiplatelet therapy given his MI/instent restenosis while off asa/plavix for 4-5 days. I spoke with Mr. Mckay, both sons and his wife about the plans as delineated here and they understand the careful balance of intracranial bleeding and coronary artery disease treatment. We will continue beta-blocker.  - Stepdown, frequent neurological checks - hold aspirin/plavix - no restart until clinical stability and neurosurgery/cardiology weighing in - continue toprol XL and pravastatin - call with any questions, concerns   Clance Baquero 07/09/2012, 7:13 PM

## 2012-07-09 NOTE — ED Provider Notes (Signed)
History     CSN: [id number]  Arrival date & time 07/09/12  1352   First MD Initiated Contact with Patient 07/09/12 1359      Chief Complaint  Patient presents with  . Fall    (Consider location/radiation/quality/duration/timing/severity/associated sxs/prior treatment) HPI  79 year old male with history of diabetes, coronary artery disease, and cervical disc disease presents for evaluations of a fall. Patient reports he was standing on a 3 foot entertainment center, to adjust his TV when he lost balance, fell backward, hitting his head against concrete floor and had a brief syncopal episode. He denies any subsequent confusion afterward but reports having mild pain to the back of his head and left shoulder. Per EMS, patient was disoriented initially. He was placed on c-collar and backboard. He did have some nausea which was treated with Zofran. Patient currently denies any significant neck pain, chest pain, back pain, or pain to his extremities. He denies any precipitating symptoms causing his fall. He however reports this is his third falls in 2 weeks. Patient is currently on Plavix and secondary to his heart disease and 5 prior stenting.  Past Medical History  Diagnosis Date  . CAD (coronary artery disease)     a. CABG x 6 in 1992. b. s/p DES x 2 to SVG -> OM1/OM2 in 09/2010. c. s/p DES to mid LAD 10/2010. d. NSTEMI (off DAPT) s/p PTCA of instent renarrowing of DES in SVG-OM, Recs for lifelong DAPT   . Hyperlipidemia     intolerance to Crestor Zocor Vytorin. He tolerates Pravachol...low HDL  . GERD (gastroesophageal reflux disease)   . Rosacea   . Prostatitis   . Depression   . Benign prostatic hypertrophy   . Asthma   . Paresthesias     successful surgery by Dr. Julio Sicks  . Arthritis   . Diabetes mellitus     type II-diet  . Diverticulosis   . Hx of CABG     1992  . Ejection fraction     EF 60%, echo, June, 2012  . BARRETTS ESOPHAGUS 06/02/2007  . Bladder neck obstruction  09/29/2010  . SKIN LESION 06/02/2007  . 1St degree AV block   . Sinus bradycardia     asymptomatic   . Cervical disc disease     Past Surgical History  Procedure Laterality Date  . C-spine disc surgery  03/2006  . C4-5 surgery  01/2009  . Cardiac catheterization      stent placed  . Coronary artery bypass graft    . Appendectomy    . Tonsillectomy    . Coccyx fracture surgery  1997    Family History  Problem Relation Age of Onset  . Heart disease Father     mother  . Aneurysm    . Hyperlipidemia    . Liver cancer      grandfather  . Diabetes Sister     mother    History  Substance Use Topics  . Smoking status: Former Smoker    Start date: 01/03/1955  . Smokeless tobacco: Not on file  . Alcohol Use: Yes      Review of Systems  Constitutional:       A complete 10 system review of systems was obtained and all systems are negative except as noted in the HPI and PMH.    Allergies  Atenolol; Codeine; Ezetimibe-simvastatin; Rosuvastatin; and Simvastatin  Home Medications   Current Outpatient Rx  Name  Route  Sig  Dispense  Refill  .  ADVAIR DISKUS 250-50 MCG/DOSE AEPB      TAKE 1 PUFF BY MOUTH TWICE A DAY   60 each   3   . ADVAIR DISKUS 250-50 MCG/DOSE AEPB      TAKE 1 PUFF BY MOUTH TWICE A DAY   60 each   3   . albuterol (PROVENTIL HFA) 108 (90 BASE) MCG/ACT inhaler   Inhalation   Inhale 2 puffs into the lungs every 6 (six) hours as needed. For shortness of breath or wheezing         . aspirin (ASPIRIN EC) 81 MG EC tablet   Oral   Take 81 mg by mouth daily.           . Cholecalciferol (VITAMIN D3) 2000 UNITS TABS   Oral   Take 1 tablet by mouth daily.          . clopidogrel (PLAVIX) 75 MG tablet   Oral   Take 1 tablet (75 mg total) by mouth daily.   30 tablet   5   . diphenhydrAMINE (BENADRYL) 25 MG tablet   Oral   Take 25 mg by mouth daily as needed.         Marland Kitchen glucose blood (ONE TOUCH ULTRA TEST) test strip      Use as  instructed once daily to check blood sugar.  Diagnosis code 250.02   100 each   2   . HYDROcodone-acetaminophen (NORCO/VICODIN) 5-325 MG per tablet   Oral   Take 0.5-1 tablets by mouth every 6 (six) hours as needed. For pain         . loratadine (CLARITIN) 10 MG tablet   Oral   Take 10 mg by mouth daily as needed.         . metoprolol succinate (TOPROL-XL) 25 MG 24 hr tablet      TAKE 1/2 TABLET TWICE A DAY   60 tablet   12   . montelukast (SINGULAIR) 10 MG tablet      TAKE 1 TABLET BY MOUTH AT BEDTIME   30 tablet   11   . niacin (NIASPAN) 1000 MG CR tablet   Oral   Take 1,000 mg by mouth at bedtime. Take with aspirin         . nitroGLYCERIN (NITROLINGUAL) 0.4 MG/SPRAY spray   Sublingual   Place 1 spray under the tongue every 5 (five) minutes as needed. 1 spray under tongue every 5 minutes as needed for chest pain (up to 3 doses)         . nitroGLYCERIN (NITROSTAT) 0.4 MG SL tablet   Sublingual   Place 0.4 mg under the tongue every 5 (five) minutes as needed. For chest pain         . pantoprazole (PROTONIX) 40 MG tablet      TAKE 1 TABLET (40 MG TOTAL) BY MOUTH DAILY.   90 tablet   1   . pravastatin (PRAVACHOL) 40 MG tablet   Oral   Take 40 mg by mouth every evening.         . Tamsulosin HCl (FLOMAX) 0.4 MG CAPS   Oral   Take 0.4 mg by mouth daily after supper.         . Tamsulosin HCl (FLOMAX) 0.4 MG CAPS      TAKE 1 CAPSULE BY MOUTH EVERY DAY   30 capsule   11     BP 128/73  Pulse 65  Temp(Src) 97.7 F (36.5 C) (Oral)  Resp 16  SpO2 94%  Physical Exam  Nursing note and vitals reviewed. Constitutional: He is oriented to person, place, and time. He appears well-developed and well-nourished. No distress.  Awake, alert, nontoxic appearance  HENT:  Head: Normocephalic and atraumatic.  Tenderness along occipital and vertex of scalp without overlying skin changes. no laceration no swelling noted  No hemotympanum.    Eyes: Conjunctivae  and EOM are normal. Pupils are equal, round, and reactive to light. Right eye exhibits no discharge. Left eye exhibits no discharge.  Neck: Normal range of motion. Neck supple.  c-collar in place  Cardiovascular: Normal rate and regular rhythm.   Pulmonary/Chest: Effort normal. No respiratory distress. He exhibits no tenderness.  Abdominal: Soft. There is no tenderness. There is no rebound.  Musculoskeletal: He exhibits tenderness (tenderness to L trapezius muscle and posterior scapula without deformity.  normal ROM of L shoulder.  mild tenderness to mid thoracic without crepitus or step off.). He exhibits no edema.  ROM appears intact, no obvious focal weakness  Neurological: He is alert and oriented to person, place, and time. He has normal strength. No cranial nerve deficit or sensory deficit. GCS eye subscore is 4. GCS verbal subscore is 5. GCS motor subscore is 6.  Gait not assess  A&Ox4.  Normal grip strength.  5/5 strength to all four extremities.   Skin: Skin is warm and dry. No rash noted.  Psychiatric: He has a normal mood and affect.    ED Course  Procedures (including critical care time)  3:41 PM Pt has a mechanical fall, hitting head against concrete and has a brief LOC.  Pt currently on plavix.  Head CT shows Right tentorial and parafalcine subdural hematoma, without left shift.  Cspine without acute changes.  Pt is neurovascularly intact.  A&Ox4.  Will consult with neurosurgery and internal medicine for obs.  Care discussed with attending.  3:58 PM I have consulted with neurosurgery, Dr. Phoebe Perch who will consult pt.  recommend medicine admission, d/c asa/plavix, step down bed, repeat head scan in 2 days.  Will consult medicine.    4:30 PM i have consulted with [facility], Dr. Gershon Crane, who agrees to admit pt to step down, team 6, under her care.  Pt is currently stable.    Labs Reviewed - No data to display Dg Thoracic Spine 2 View  07/09/2012  *RADIOLOGY REPORT*   Clinical Data: Back pain status post fall today.  THORACIC SPINE - 2 VIEW  Comparison: Chest radiographs 12/20/2011.  Findings: Patient is status post multilevel cervical fusion.  The thoracic alignment is stable with a mild convex right scoliosis. There is no evidence of acute fracture or paraspinal abnormality. Multiple paraspinal osteophytes are noted.  Patient is status post CABG.  Coronary artery stents are also noted.  IMPRESSION: No evidence of acute thoracic spine injury.   Original Report Authenticated By: Carey Bullocks, M.D.    Ct Head Wo Contrast  07/09/2012  *RADIOLOGY REPORT*  Clinical Data:  Altered mental status after falling today.  CT HEAD WITHOUT CONTRAST CT CERVICAL SPINE WITHOUT CONTRAST  Technique:  Multidetector CT imaging of the head and cervical spine was performed following the standard protocol without intravenous contrast.  Multiplanar CT image reconstructions of the cervical spine were also generated.  Comparison:  05/23/2007.  CT HEAD  Findings: Right tentorial and parafalcine subdural hematoma, measuring 8 mm in maximum thickness on image number 21.  No associated mass effect.  Enlarged ventricles and subarachnoid spaces.  Patchy white matter low density  in both cerebral hemispheres.  No skull fractures or paranasal sinus air-fluid levels.  Minimal left sphenoid sinus mucosal thickening.  IMPRESSION:  1.  Right tentorial and parafalcine subdural hematoma, as described above. 2.  Minimally progressive atrophy and chronic small vessel white matter ischemic changes. 3.  Minimal chronic sphenoid sinusitis.  CT CERVICAL SPINE  Findings: Interbody bone plug and anterior screw and plate fixation at the C4-C6 levels with normal alignment.  Solid interbody bone plug anterior fusion at the C3-4 level.  Anterior and posterior bony bridging at the C2-3 level.  Anterior and posterior spur formation at the C6-7 level and anterior spurs at the C7-T1 and T1- T2 levels.  Facet degenerative changes  at multiple levels.  No prevertebral soft tissue swelling, fractures or subluxations. Bilateral carotid artery atheromatous calcifications.  IMPRESSION:  1.  No fracture or subluxation. 2.  Postoperative and degenerative changes, as described above. 3.  Bilateral carotid artery atheromatous calcifications.  Critical Value/emergent results were called by telephone at the time of interpretation on 07/09/2012 at 1514 hours to Dr. Laveda Norman, who verbally acknowledged these results.   Original Report Authenticated By: Beckie Salts, M.D.    Ct Cervical Spine Wo Contrast  07/09/2012  *RADIOLOGY REPORT*  Clinical Data:  Altered mental status after falling today.  CT HEAD WITHOUT CONTRAST CT CERVICAL SPINE WITHOUT CONTRAST  Technique:  Multidetector CT imaging of the head and cervical spine was performed following the standard protocol without intravenous contrast.  Multiplanar CT image reconstructions of the cervical spine were also generated.  Comparison:  05/23/2007.  CT HEAD  Findings: Right tentorial and parafalcine subdural hematoma, measuring 8 mm in maximum thickness on image number 21.  No associated mass effect.  Enlarged ventricles and subarachnoid spaces.  Patchy white matter low density in both cerebral hemispheres.  No skull fractures or paranasal sinus air-fluid levels.  Minimal left sphenoid sinus mucosal thickening.  IMPRESSION:  1.  Right tentorial and parafalcine subdural hematoma, as described above. 2.  Minimally progressive atrophy and chronic small vessel white matter ischemic changes. 3.  Minimal chronic sphenoid sinusitis.  CT CERVICAL SPINE  Findings: Interbody bone plug and anterior screw and plate fixation at the C4-C6 levels with normal alignment.  Solid interbody bone plug anterior fusion at the C3-4 level.  Anterior and posterior bony bridging at the C2-3 level.  Anterior and posterior spur formation at the C6-7 level and anterior spurs at the C7-T1 and T1- T2 levels.  Facet degenerative changes  at multiple levels.  No prevertebral soft tissue swelling, fractures or subluxations. Bilateral carotid artery atheromatous calcifications.  IMPRESSION:  1.  No fracture or subluxation. 2.  Postoperative and degenerative changes, as described above. 3.  Bilateral carotid artery atheromatous calcifications.  Critical Value/emergent results were called by telephone at the time of interpretation on 07/09/2012 at 1514 hours to Dr. Laveda Norman, who verbally acknowledged these results.   Original Report Authenticated By: Beckie Salts, M.D.    Dg Shoulder Left  07/09/2012  *RADIOLOGY REPORT*  Clinical Data: Shoulder pain.  Fall.  LEFT SHOULDER - 2+ VIEW  Comparison: Cervical spine CT 07/09/2012  Findings: The mineralization and alignment are normal.  There is no evidence of acute fracture or dislocation.  The subacromial space is preserved.  There are moderate glenohumeral degenerative changes.  Patient is status post lower cervical fusion.  The left C6 screw is absent.  IMPRESSION: No acute osseous findings.  Moderate glenohumeral degenerative changes.   Original Report Authenticated By: Carey Bullocks, M.D.  1. Fall at home, initial encounter   2. Subdural hematoma caused by concussion, initial encounter       MDM  BP 128/73  Pulse 65  Temp(Src) 97.7 F (36.5 C) (Oral)  Resp 16  SpO2 94%  I have reviewed nursing notes and vital signs. I personally reviewed the imaging tests through PACS system  I reviewed available ER/hospitalization records thought the EMR         Fayrene Helper, [place] 07/09/12 1634

## 2012-07-09 NOTE — H&P (Addendum)
History and Physical       Hospital Admission Note Date: 07/09/2012  Patient name: Sean Lozano Medical record number: [id number] Date of birth: 02/11/1934 Age: 79 y.o. Gender: male PCP: Oliver Barre, MD    Chief Complaint:  Mechanical fall resulting in subdural hematoma  HPI: Patient is 79 year old male with history of coronary disease s/p CABG, multiple PCI's follows Dr Theodora Blow (cardiology), hypertension, hyperlipidemia, BPH, diet-controlled type 2 diabetes mellitus, GERD presented with mechanical home. Patient reported that he was standing on a 3 foot entertainment center to adjust his flat screen TV when he lost his balance, fell backward and hit his head against a concrete floor. Patient had no dizziness or lightheadedness, chest pain, palpitations prior to the event. He had 3 falls during this month, first on the ice/snow, second in a restaurant and third is the current episode. He denied any cardiac event after the fall, reported mild pain to his back of his head and left shoulder. He reported no nausea, vomiting, dehydration, diarrhea recently. ED workup showed right tentorial and parafalcine subdural hematoma on the CT head but no fracture or subluxation on CT C-spine. Left shoulder x-ray and thoracic spine x-ray were normal. Neurosurgery was consulted by EDP and recommended medical admission for observation in SDU.   Review of Systems:  Constitutional: Denies fever, chills, diaphoresis, appetite change and fatigue.  HEENT: Denies photophobia, eye pain, redness, hearing loss, ear pain, congestion, sore throat, rhinorrhea, sneezing, mouth sores, trouble swallowing, neck pain, neck stiffness and tinnitus.   Respiratory: Denies SOB, DOE, cough, chest tightness,  and wheezing.   Cardiovascular: Denies chest pain, palpitations and leg swelling.  Gastrointestinal: Denies nausea, vomiting, abdominal pain, diarrhea, constipation, blood in  stool and abdominal distention.  Genitourinary: Denies dysuria, urgency, frequency, hematuria, flank pain and difficulty urinating.  Musculoskeletal: Denies myalgias, back pain, joint swelling, arthralgias and gait problem.  Skin: Denies pallor, rash and wound.  Neurological: Denies dizziness, seizures, syncope, weakness, light-headedness, numbness and headaches.  Hematological: Denies adenopathy. Easy bruising, personal or family bleeding history  Psychiatric/Behavioral: Denies suicidal ideation, mood changes, confusion, nervousness, sleep disturbance and agitation  Past Medical History: Past Medical History  Diagnosis Date  . CAD (coronary artery disease)     a. CABG x 6 in 1992. b. s/p DES x 2 to SVG -> OM1/OM2 in 09/2010. c. s/p DES to mid LAD 10/2010. d. NSTEMI (off DAPT) s/p PTCA of instent renarrowing of DES in SVG-OM, Recs for lifelong DAPT   . Hyperlipidemia     intolerance to Crestor Zocor Vytorin. He tolerates Pravachol...low HDL  . GERD (gastroesophageal reflux disease)   . Rosacea   . Prostatitis   . Depression   . Benign prostatic hypertrophy   . Asthma   . Paresthesias     successful surgery by Dr. Julio Sicks  . Arthritis   . Diabetes mellitus     type II-diet  . Diverticulosis   . Hx of CABG     1992  . Ejection fraction     EF 60%, echo, June, 2012  . BARRETTS ESOPHAGUS 06/02/2007  . Bladder neck obstruction 09/29/2010  . SKIN LESION 06/02/2007  . 1St degree AV block   . Sinus bradycardia     asymptomatic   . Cervical disc disease    Past Surgical History  Procedure Laterality Date  . C-spine disc surgery  03/2006  . C4-5 surgery  01/2009  . Cardiac catheterization      stent placed  . Coronary  artery bypass graft    . Appendectomy    . Tonsillectomy    . Coccyx fracture surgery  1997    Medications: Prior to Admission medications   Medication Sig Start Date End Date Taking? Authorizing Provider  albuterol (PROVENTIL HFA) 108 (90 BASE) MCG/ACT inhaler  Inhale 2 puffs into the lungs every 6 (six) hours as needed. For shortness of breath or wheezing   Yes Historical Provider, MD  aspirin (ASPIRIN EC) 81 MG EC tablet Take 81 mg by mouth daily.     Yes Historical Provider, MD  Cholecalciferol (VITAMIN D3) 2000 UNITS TABS Take 1 tablet by mouth daily.    Yes Historical Provider, MD  clopidogrel (PLAVIX) 75 MG tablet Take 1 tablet (75 mg total) by mouth daily. 02/29/12  Yes Luis Abed, MD  diphenhydrAMINE (BENADRYL) 25 MG tablet Take 25 mg by mouth daily as needed for allergies.    Yes Historical Provider, MD  Fluticasone-Salmeterol (ADVAIR DISKUS) 250-50 MCG/DOSE AEPB Inhale 1 puff into the lungs every 12 (twelve) hours.   Yes Historical Provider, MD  glucose blood (ONE TOUCH ULTRA TEST) test strip Use as instructed once daily to check blood sugar.  Diagnosis code 250.02 04/17/12  Yes Corwin Levins, MD  loratadine (CLARITIN) 10 MG tablet Take 10 mg by mouth daily as needed for allergies.    Yes Historical Provider, MD  metoprolol succinate (TOPROL-XL) 25 MG 24 hr tablet Take 12.5 mg by mouth 2 (two) times daily.   Yes Historical Provider, MD  montelukast (SINGULAIR) 10 MG tablet TAKE 1 TABLET BY MOUTH AT BEDTIME 05/10/12  Yes Corwin Levins, MD  niacin (NIASPAN) 1000 MG CR tablet Take 1,000 mg by mouth at bedtime. Take with aspirin   Yes Historical Provider, MD  nitroGLYCERIN (NITROLINGUAL) 0.4 MG/SPRAY spray Place 1 spray under the tongue every 5 (five) minutes as needed for chest pain. 1 spray under tongue every 5 minutes as needed for chest pain (up to 3 doses)   Yes Historical Provider, MD  nitroGLYCERIN (NITROSTAT) 0.4 MG SL tablet Place 0.4 mg under the tongue every 5 (five) minutes as needed. For chest pain   Yes Historical Provider, MD  pantoprazole (PROTONIX) 40 MG tablet TAKE 1 TABLET (40 MG TOTAL) BY MOUTH DAILY. 04/16/12  Yes Corwin Levins, MD  pantoprazole (PROTONIX) 40 MG tablet Take 40 mg by mouth daily.   Yes Historical Provider, MD   pravastatin (PRAVACHOL) 40 MG tablet Take 40 mg by mouth every evening.   Yes Historical Provider, MD  tamsulosin (FLOMAX) 0.4 MG CAPS Take 0.4 mg by mouth daily after supper.   Yes Historical Provider, MD    Allergies:   Allergies  Allergen Reactions  . Atenolol Other (See Comments)    depression  . Codeine Other (See Comments)    Crazy feeling  . Ezetimibe-Simvastatin Other (See Comments)    Severe hip pain  . Rosuvastatin Other (See Comments)    Severe hip pain  . Simvastatin Other (See Comments)    Severe hip pain    Social History:  reports that he has quit smoking. He started smoking about 57 years ago. He does not have any smokeless tobacco history on file. He reports that  drinks alcohol. He reports that he does not use illicit drugs.  Family History: Family History  Problem Relation Age of Onset  . Heart disease Father     mother  . Aneurysm    . Hyperlipidemia    . Liver  cancer      grandfather  . Diabetes Sister     mother    Physical Exam: Blood pressure 130/77, pulse 74, temperature 97.9 F (36.6 C), temperature source Oral, resp. rate 15, SpO2 93.00%. General: Alert, awake, oriented x3, in no acute distress. HEENT: normocephalic, atraumatic, anicteric sclera, pink conjunctiva, PERLA, oropharynx clear Neck: supple, no masses or lymphadenopathy, no goiter, no bruits  Heart: Regular rate and rhythm, without murmurs, rubs or gallops. Lungs: Clear to auscultation bilaterally, no wheezing, rales or rhonchi. Abdomen: Soft, nontender, nondistended, positive bowel sounds, no masses. Extremities: No clubbing, cyanosis or edema with positive pedal pulses. Neuro: Grossly intact, no focal neurological deficits, strength 5/5 upper and lower extremities bilaterally Psych: alert and oriented x 3, normal mood and affect Skin: no rashes or lesions, warm and dry   LABS on Admission:  Basic Metabolic Panel:  Recent Labs Lab 07/09/12 1430  NA 135  K 3.9  CL 99   CO2 25  GLUCOSE 167*  BUN 13  CREATININE 1.11  CALCIUM 9.1   CBC:  Recent Labs Lab 07/09/12 1430  WBC 13.0*  NEUTROABS 9.9*  HGB 15.7  HCT 43.9  MCV 94.6  PLT 151   CBG:  Recent Labs Lab 07/09/12 1444  GLUCAP 200*     Radiological Exams on Admission: Dg Thoracic Spine 2 View  07/09/2012  *RADIOLOGY REPORT*  Clinical Data: Back pain status post fall today.  THORACIC SPINE - 2 VIEW  Comparison: Chest radiographs 12/20/2011.  Findings: Patient is status post multilevel cervical fusion.  The thoracic alignment is stable with a mild convex right scoliosis. There is no evidence of acute fracture or paraspinal abnormality. Multiple paraspinal osteophytes are noted.  Patient is status post CABG.  Coronary artery stents are also noted.  IMPRESSION: No evidence of acute thoracic spine injury.   Original Report Authenticated By: Carey Bullocks, M.D.    Ct Head Wo Contrast  07/09/2012  *RADIOLOGY REPORT*  Clinical Data:  Altered mental status after falling today.  CT HEAD WITHOUT CONTRAST CT CERVICAL SPINE WITHOUT CONTRAST  Technique:  Multidetector CT imaging of the head and cervical spine was performed following the standard protocol without intravenous contrast.  Multiplanar CT image reconstructions of the cervical spine were also generated.  Comparison:  05/23/2007.  CT HEAD  Findings: Right tentorial and parafalcine subdural hematoma, measuring 8 mm in maximum thickness on image number 21.  No associated mass effect.  Enlarged ventricles and subarachnoid spaces.  Patchy white matter low density in both cerebral hemispheres.  No skull fractures or paranasal sinus air-fluid levels.  Minimal left sphenoid sinus mucosal thickening.  IMPRESSION:  1.  Right tentorial and parafalcine subdural hematoma, as described above. 2.  Minimally progressive atrophy and chronic small vessel white matter ischemic changes. 3.  Minimal chronic sphenoid sinusitis.  CT CERVICAL SPINE  Findings: Interbody bone  plug and anterior screw and plate fixation at the C4-C6 levels with normal alignment.  Solid interbody bone plug anterior fusion at the C3-4 level.  Anterior and posterior bony bridging at the C2-3 level.  Anterior and posterior spur formation at the C6-7 level and anterior spurs at the C7-T1 and T1- T2 levels.  Facet degenerative changes at multiple levels.  No prevertebral soft tissue swelling, fractures or subluxations. Bilateral carotid artery atheromatous calcifications.  IMPRESSION:  1.  No fracture or subluxation. 2.  Postoperative and degenerative changes, as described above. 3.  Bilateral carotid artery atheromatous calcifications.  Critical Value/emergent results were called  by telephone at the time of interpretation on 07/09/2012 at 1514 hours to Dr. Laveda Norman, who verbally acknowledged these results.   Original Report Authenticated By: Beckie Salts, M.D.    Ct Cervical Spine Wo Contrast  07/09/2012  *RADIOLOGY REPORT*  Clinical Data:  Altered mental status after falling today.  CT HEAD WITHOUT CONTRAST CT CERVICAL SPINE WITHOUT CONTRAST  Technique:  Multidetector CT imaging of the head and cervical spine was performed following the standard protocol without intravenous contrast.  Multiplanar CT image reconstructions of the cervical spine were also generated.  Comparison:  05/23/2007.  CT HEAD  Findings: Right tentorial and parafalcine subdural hematoma, measuring 8 mm in maximum thickness on image number 21.  No associated mass effect.  Enlarged ventricles and subarachnoid spaces.  Patchy white matter low density in both cerebral hemispheres.  No skull fractures or paranasal sinus air-fluid levels.  Minimal left sphenoid sinus mucosal thickening.  IMPRESSION:  1.  Right tentorial and parafalcine subdural hematoma, as described above. 2.  Minimally progressive atrophy and chronic small vessel white matter ischemic changes. 3.  Minimal chronic sphenoid sinusitis.  CT CERVICAL SPINE  Findings: Interbody bone  plug and anterior screw and plate fixation at the C4-C6 levels with normal alignment.  Solid interbody bone plug anterior fusion at the C3-4 level.  Anterior and posterior bony bridging at the C2-3 level.  Anterior and posterior spur formation at the C6-7 level and anterior spurs at the C7-T1 and T1- T2 levels.  Facet degenerative changes at multiple levels.  No prevertebral soft tissue swelling, fractures or subluxations. Bilateral carotid artery atheromatous calcifications.  IMPRESSION:  1.  No fracture or subluxation. 2.  Postoperative and degenerative changes, as described above. 3.  Bilateral carotid artery atheromatous calcifications.  Critical Value/emergent results were called by telephone at the time of interpretation on 07/09/2012 at 1514 hours to Dr. Laveda Norman, who verbally acknowledged these results.   Original Report Authenticated By: Beckie Salts, M.D.    Dg Shoulder Left  07/09/2012  *RADIOLOGY REPORT*  Clinical Data: Shoulder pain.  Fall.  LEFT SHOULDER - 2+ VIEW  Comparison: Cervical spine CT 07/09/2012  Findings: The mineralization and alignment are normal.  There is no evidence of acute fracture or dislocation.  The subacromial space is preserved.  There are moderate glenohumeral degenerative changes.  Patient is status post lower cervical fusion.  The left C6 screw is absent.  IMPRESSION: No acute osseous findings.  Moderate glenohumeral degenerative changes.   Original Report Authenticated By: Carey Bullocks, M.D.     Assessment/Plan Principal Problem:   SDH (subdural hematoma) secondary to mechanical fall and hitting his head, patient is currently on Aspirin and plavix due to CAD. - admit to step down, frequent neuro checks, neurosurgery (Dr Phoebe Perch) has been consulted and recommended observation, hold aspirin and Plavix, repeat CT scan - patient apparently has significant CAD with history of 6vCABG 1992 and multiple PCI's, NSTEMI in 9/13 after Plavix was held for an epidural injection for  cervical stenosis pain. Patient was recommended for lifelong aspirin and Plavix. Patient's son stated that he does not want Plavix to be held for the fear of having another 'heart attack". I explained him that in the current situation of subdural hematoma with risk for worsening of bleeding, I have to hold aspirin and Plavix. Patient's son also demanded that his cardiologist be called before I hold the aspirin and Plavix. I have called [facility], Dr Tresa Endo on call who agreed with my current assessment and recommended  to hold aspirin and Plavix until patient is cleared by neurosurgery to restart it. Dr. Tresa Endo will see the patient and explain the current recommendations to patient's son and his family. - given his recent falls, he needs a PT evaluation prior to discharge   Active Problems:    GERD: continue PPI    Hyperlipidemia - Patient is apparently allergic to most of the statins, will have pharmacy put in pravastatin (patient's home medications)    Type II or unspecified type diabetes mellitus  - diet controlled, obtain hemoglobin A1c    CAD (coronary artery disease) - Continue Toprol-XL, hold aspirin and Plavix due to subdural hematoma. - Further recommendations by cardiology  DVT prophylaxis: SCD's  CODE STATUS:   Further plan will depend as patient's clinical course evolves and further radiologic and laboratory data become available.   Time Spent on Admission: 1 hour  Twain Stenseth M.D. [facility] 07/09/2012, 5:26 PM Pager: [phone]  If 7PM-7AM, please contact night-coverage [email or web address] Password [email or web address]  Addendum:  Just spoke with cardiology on call, Dr Tresa Endo who explained the patient and his family regarding holding ASA and plavix in the current situation of SDH for the risk of worsening of intra-cranial bleed. It certainly places the patient at higher risk of having a cardiac event.  Dr Tresa Endo also relayed to me that patient's son was apologetic for  his earlier behavior. The patient and family agree with current plan as outlined above and the neurosurgery recommendations.    Brittani Purdum M.D. [facility] 07/09/2012, 7:24 PM  Pager: [phone]

## 2012-07-09 NOTE — ED Notes (Signed)
Pt presents to ED via EMS from home with c/o of fall today. Patient was standing on 3 foot entertainment center when he fell. No LOC. Pt disoriented when EMS arrived. C-collar and back board in place. EMS placed 18g to left ac and gave 4mg  IV zofran for nausea. NAD.

## 2012-07-10 DIAGNOSIS — W19XXXA Unspecified fall, initial encounter: Secondary | ICD-10-CM

## 2012-07-10 DIAGNOSIS — J9601 Acute respiratory failure with hypoxia: Secondary | ICD-10-CM | POA: Diagnosis present

## 2012-07-10 DIAGNOSIS — D72829 Elevated white blood cell count, unspecified: Secondary | ICD-10-CM | POA: Diagnosis present

## 2012-07-10 DIAGNOSIS — I5032 Chronic diastolic (congestive) heart failure: Secondary | ICD-10-CM

## 2012-07-10 DIAGNOSIS — I2510 Atherosclerotic heart disease of native coronary artery without angina pectoris: Secondary | ICD-10-CM

## 2012-07-10 LAB — GLUCOSE, CAPILLARY
Glucose-Capillary: 106 mg/dL — ABNORMAL HIGH (ref 70–99)
Glucose-Capillary: 157 mg/dL — ABNORMAL HIGH (ref 70–99)
Glucose-Capillary: 114 mg/dL — ABNORMAL HIGH (ref 70–99)
Glucose-Capillary: 156 mg/dL — ABNORMAL HIGH (ref 70–99)

## 2012-07-10 LAB — CBC
HCT: 43.3 % (ref 39.0–52.0)
Hemoglobin: 15 g/dL (ref 13.0–17.0)
MCH: 33 pg (ref 26.0–34.0)
MCHC: 34.6 g/dL (ref 30.0–36.0)
MCV: 95.4 fL (ref 78.0–100.0)
Platelets: 161 10*3/uL (ref 150–400)
RBC: 4.54 MIL/uL (ref 4.22–5.81)
RDW: 13.1 % (ref 11.5–15.5)
WBC: 13.3 10*3/uL — ABNORMAL HIGH (ref 4.0–10.5)

## 2012-07-10 LAB — BASIC METABOLIC PANEL
BUN: 14 mg/dL (ref 6–23)
CO2: 25 mEq/L (ref 19–32)
Calcium: 8.8 mg/dL (ref 8.4–10.5)
Chloride: 105 mEq/L (ref 96–112)
Creatinine, Ser: 1.09 mg/dL (ref 0.50–1.35)
GFR calc Af Amer: 74 mL/min — ABNORMAL LOW (ref >90)
GFR calc non Af Amer: 63 mL/min — ABNORMAL LOW (ref >90)
Glucose, Bld: 120 mg/dL — ABNORMAL HIGH (ref 70–99)
Potassium: 3.9 mEq/L (ref 3.5–5.1)
Sodium: 139 mEq/L (ref 135–145)

## 2012-07-10 LAB — HEMOGLOBIN A1C
Hgb A1c MFr Bld: 6.5 % — ABNORMAL HIGH (ref <5.7)
Mean Plasma Glucose: 140 mg/dL — ABNORMAL HIGH (ref <117)

## 2012-07-10 LAB — MRSA PCR SCREENING: MRSA by PCR: NEGATIVE

## 2012-07-10 MED ORDER — PROMETHAZINE HCL 25 MG/ML IJ SOLN
12.5000 mg | Freq: Four times a day (QID) | INTRAMUSCULAR | Status: DC | PRN
  Administered 2012-07-11: 12.5 mg via INTRAVENOUS
  Filled 2012-07-10 (×2): qty 1

## 2012-07-10 NOTE — Progress Notes (Signed)
[facility] Progress Note [place] TEAM 1 - Stepdown/ICU TEAM   Sean Lozano YQM:[id number] DOB: 08/26/1933 DOA: 07/09/2012 PCP: Oliver Barre, MD  Brief narrative: 79 year old male patient with history of coronary artery disease and past PCI's on Plavix and aspirin. He was sent to the hospital after a mechanical fall. He reported standing on a 3 foot tall entertainment center to adjust his screen TV when he lost his balance fell backward and hit his head against a concrete floor. He only remembers waking up in the custody of EMS after the fall.  He had no dizziness or lightheadedness prior to the fall. He endorses this his third fall in the past month. All were mechanical related. In the ER CT scan revealed right tentorial and parafalcine subdural hematoma but no skull fracture nor any subluxation or fracture in the cervical spine. Left shoulder x-ray and thoracic spine were also normal. Neurosurgery was consulted by the emergency room physician who recommended medical admission for observation to the step down unit.  Assessment/Plan:  SDH (subdural hematoma) -NS following -Plavix/ASA on hold (see below) -FU CT head pending for 4/1 -clinically stable on exam today - even denies HA  CAD (coronary artery disease) -pt with history of NSTEMI 9/13 after Plavix and ASA were held 5 days pre procedure;because of this both the patient and his son were reluctant to allow the Medicine or NS doctors to hold these meds before current situation had been reviewed by the pt's cardiologist -Dr. Tresa Endo rapidly evaluated the pt after admission and discussed in great detail the rationale behind holding Plavix and ASA in the current situation explaining the neurosurgical risk of continuing for now outweighs the cardiac benefit:  "There is a risk of MI and/or instent restenosis while holding dual-antiplatelet therapy as I discussed with Mr. Brem and his family but at this juncture, there is a much higher  risk of expansion of his subdural hematoma. His last stent was actually in late 2012 so he is greater than 1 year after stent placement but his current medical plan is indefinite dual-antiplatelet therapy given his MI/instent restenosis while off asa/plavix for 4-5 days. I spoke with Mr. Ratay, both sons and his wife about the plans as delineated here and they understand the careful balance of intracranial bleeding and coronary artery disease treatment. We will continue beta-blocker."  -cont Beta blocker  Chronic diastolic congestive heart failure, NYHA class 1/ECHO 2010 -compensated at present  BENIGN PROSTATIC HYPERTROPHY -monitor for urinary retention -cont Flomax   Type II or unspecified type diabetes mellitus without mention of complication -HgbA1c 6.5 -diet controlled at home/cont SSI here  Leukocytosis -likely due to SDH - follow  DEPRESSION  GERD -PPI  Hyperlipidemia -Pravachol/Niacin  DVT prophylaxis: SCDs only due to ICH Code Status: Full Family Communication: Patient Disposition Plan: Stepdown Isolation: None  Consultants: Neurosurgery Cardiology  Procedures: None  Antibiotics: None  HPI/Subjective: Patient alert without any specific complaints verbalized. No chest pain or shortness of breath.  Denies HA or focal neurologic complaints.  Objective: Blood pressure 122/66, pulse 62, temperature 97.7 F (36.5 C), temperature source Oral, resp. rate 19, height 5\' 11"  (1.803 m), weight 96.6 kg (212 lb 15.4 oz), SpO2 93.00%.  Intake/Output Summary (Last 24 hours) at 07/10/12 1536 Last data filed at 07/10/12 1500  Gross per 24 hour  Intake   2115 ml  Output   2350 ml  Net   -235 ml    Exam: General: No acute respiratory distress Lungs: Clear to  auscultation bilaterally without wheezes or crackles, RA Cardiovascular: Regular rate and rhythm without murmur gallop or rub normal S1 and S2, no peripheral edema or JVD Abdomen: Nontender, nondistended, soft,  bowel sounds positive, no rebound, no ascites, no appreciable mass Musculoskeletal: No significant cyanosis, clubbing of bilateral lower extremities Neurological: Alert and oriented x 4, moves all extremities x 4 without focal neurological deficits, CN 2-13 intact  Data Reviewed: Basic Metabolic Panel:  Recent Labs Lab 07/09/12 1430 07/10/12 0500  NA 135 139  K 3.9 3.9  CL 99 105  CO2 25 25  GLUCOSE 167* 120*  BUN 13 14  CREATININE 1.11 1.09  CALCIUM 9.1 8.8   CBC:  Recent Labs Lab 07/09/12 1430 07/10/12 0500  WBC 13.0* 13.3*  NEUTROABS 9.9*  --   HGB 15.7 15.0  HCT 43.9 43.3  MCV 94.6 95.4  PLT 151 161   CBG:  Recent Labs Lab 07/09/12 1444 07/09/12 2000 07/09/12 2218 07/10/12 0724 07/10/12 1242  GLUCAP 200* 154* 197* 106* 157*    Recent Results (from the past 240 hour(s))  MRSA PCR SCREENING     Status: None   Collection Time    07/09/12 11:24 PM      Result Value Range Status   MRSA by PCR NEGATIVE  NEGATIVE Final   Comment:            The GeneXpert MRSA Assay (FDA     approved for NASAL specimens     only), is one component of a     comprehensive MRSA colonization     surveillance program. It is not     intended to diagnose MRSA     infection nor to guide or     monitor treatment for     MRSA infections.     Studies:  Recent x-ray studies have been reviewed in detail by the Attending Physician  Scheduled Meds:  Reviewed in detail by the Attending Physician   Junious Silk, ANP [facility] Office  [phone] Pager [phone]  On-Call/Text Page:      [email or web address]      password [email or web address]  If 7PM-7AM, please contact night-coverage [email or web address] Password [email or web address] 07/10/2012, 3:36 PM   LOS: 1 day   I have personally examined this patient and reviewed the entire database. I have reviewed the above note, made any necessary editorial changes, and agree with its content.  Lonia Blood, MD [facility]

## 2012-07-10 NOTE — Consult Note (Signed)
Reason for Consult:SDH Referring Physician: Cathren Harsh, MD   Sean Lozano is an 79 y.o. male.  HPI: Patient is 79 year old male with history of coronary disease s/p CABG, multiple PCI's follows Dr Theodora Blow (cardiology) and on plavix and ASA, hypertension, hyperlipidemia, BPH, diet-controlled type 2 diabetes mellitus, GERD presented with fall. Patient reported that he was standing on a 3 foot entertainment center to adjust his flat screen TV when he lost his balance, fell backward and hit his back of head against floor.+ LOC.   ER workup showed right tentorial and parafalcine subdural hematoma on the CT head but no fracture or subluxation on CT C-spine.  Pt was admitted for observation and we were consulted.  PMH:  Past Medical History  Diagnosis Date  . CAD (coronary artery disease)     a. CABG x 6 in 1992. b. s/p DES x 2 to SVG -> OM1/OM2 in 09/2010. c. s/p DES to mid LAD 10/2010. d. NSTEMI (off DAPT) s/p PTCA of instent renarrowing of DES in SVG-OM, Recs for lifelong DAPT   . Hyperlipidemia     intolerance to Crestor Zocor Vytorin. He tolerates Pravachol...low HDL  . GERD (gastroesophageal reflux disease)   . Rosacea   . Prostatitis   . Depression   . Benign prostatic hypertrophy   . Asthma   . Paresthesias     successful surgery by Dr. Julio Sicks  . Arthritis   . Diabetes mellitus     type II-diet  . Diverticulosis   . Hx of CABG     1992  . Ejection fraction     EF 60%, echo, June, 2012  . BARRETTS ESOPHAGUS 06/02/2007  . Bladder neck obstruction 09/29/2010  . SKIN LESION 06/02/2007  . 1St degree AV block   . Sinus bradycardia     asymptomatic   . Cervical disc disease     Past Surgical History  Procedure Laterality Date  . C-spine disc surgery  03/2006  . C4-5 surgery  01/2009  . Cardiac catheterization      stent placed  . Coronary artery bypass graft    . Appendectomy    . Tonsillectomy    . Coccyx fracture surgery  1997    Family History:  Family History   Problem Relation Age of Onset  . Heart disease Father     mother  . Aneurysm    . Hyperlipidemia    . Liver cancer      grandfather  . Diabetes Sister     mother    Social History:  reports that he has quit smoking. He started smoking about 57 years ago. He does not have any smokeless tobacco history on file. He reports that  drinks alcohol. He reports that he does not use illicit drugs.  Allergies:  Allergies  Allergen Reactions  . Atenolol Other (See Comments)    depression  . Codeine Other (See Comments)    Crazy feeling  . Ezetimibe-Simvastatin Other (See Comments)    Severe hip pain  . Rosuvastatin Other (See Comments)    Severe hip pain  . Simvastatin Other (See Comments)    Severe hip pain    Prior to Admission medications   Medication Sig Start Date End Date Taking? Authorizing Provider  albuterol (PROVENTIL HFA) 108 (90 BASE) MCG/ACT inhaler Inhale 2 puffs into the lungs every 6 (six) hours as needed. For shortness of breath or wheezing   Yes Historical Provider, MD  aspirin (ASPIRIN EC) 81 MG EC  tablet Take 81 mg by mouth daily.     Yes Historical Provider, MD  Cholecalciferol (VITAMIN D3) 2000 UNITS TABS Take 1 tablet by mouth daily.    Yes Historical Provider, MD  clopidogrel (PLAVIX) 75 MG tablet Take 1 tablet (75 mg total) by mouth daily. 02/29/12  Yes Luis Abed, MD  diphenhydrAMINE (BENADRYL) 25 MG tablet Take 25 mg by mouth daily as needed for allergies.    Yes Historical Provider, MD  Fluticasone-Salmeterol (ADVAIR DISKUS) 250-50 MCG/DOSE AEPB Inhale 1 puff into the lungs every 12 (twelve) hours.   Yes Historical Provider, MD  glucose blood (ONE TOUCH ULTRA TEST) test strip Use as instructed once daily to check blood sugar.  Diagnosis code 250.02 04/17/12  Yes Corwin Levins, MD  loratadine (CLARITIN) 10 MG tablet Take 10 mg by mouth daily as needed for allergies.    Yes Historical Provider, MD  metoprolol succinate (TOPROL-XL) 25 MG 24 hr tablet Take 12.5 mg  by mouth 2 (two) times daily.   Yes Historical Provider, MD  montelukast (SINGULAIR) 10 MG tablet TAKE 1 TABLET BY MOUTH AT BEDTIME 05/10/12  Yes Corwin Levins, MD  niacin (NIASPAN) 1000 MG CR tablet Take 1,000 mg by mouth at bedtime. Take with aspirin   Yes Historical Provider, MD  nitroGLYCERIN (NITROLINGUAL) 0.4 MG/SPRAY spray Place 1 spray under the tongue every 5 (five) minutes as needed for chest pain. 1 spray under tongue every 5 minutes as needed for chest pain (up to 3 doses)   Yes Historical Provider, MD  nitroGLYCERIN (NITROSTAT) 0.4 MG SL tablet Place 0.4 mg under the tongue every 5 (five) minutes as needed. For chest pain   Yes Historical Provider, MD  pantoprazole (PROTONIX) 40 MG tablet TAKE 1 TABLET (40 MG TOTAL) BY MOUTH DAILY. 04/16/12  Yes Corwin Levins, MD  pantoprazole (PROTONIX) 40 MG tablet Take 40 mg by mouth daily.   Yes Historical Provider, MD  pravastatin (PRAVACHOL) 40 MG tablet Take 40 mg by mouth every evening.   Yes Historical Provider, MD  tamsulosin (FLOMAX) 0.4 MG CAPS Take 0.4 mg by mouth daily after supper.   Yes Historical Provider, MD    Results for orders placed during the hospital encounter of 07/09/12 (from the past 48 hour(s))  CBC WITH DIFFERENTIAL     Status: Abnormal   Collection Time    07/09/12  2:30 PM      Result Value Range   WBC 13.0 (*) 4.0 - 10.5 K/uL   RBC 4.64  4.22 - 5.81 MIL/uL   Hemoglobin 15.7  13.0 - 17.0 g/dL   HCT 96.0  45.4 - 09.8 %   MCV 94.6  78.0 - 100.0 fL   MCH 33.8  26.0 - 34.0 pg   MCHC 35.8  30.0 - 36.0 g/dL   RDW 11.9  14.7 - 82.9 %   Platelets 151  150 - 400 K/uL   Neutrophils Relative 76  43 - 77 %   Neutro Abs 9.9 (*) 1.7 - 7.7 K/uL   Lymphocytes Relative 16  12 - 46 %   Lymphs Abs 2.0  0.7 - 4.0 K/uL   Monocytes Relative 6  3 - 12 %   Monocytes Absolute 0.8  0.1 - 1.0 K/uL   Eosinophils Relative 2  0 - 5 %   Eosinophils Absolute 0.2  0.0 - 0.7 K/uL   Basophils Relative 0  0 - 1 %   Basophils Absolute 0.0  0.0 -  0.1 K/uL  BASIC METABOLIC PANEL     Status: Abnormal   Collection Time    07/09/12  2:30 PM      Result Value Range   Sodium 135  135 - 145 mEq/L   Potassium 3.9  3.5 - 5.1 mEq/L   Chloride 99  96 - 112 mEq/L   CO2 25  19 - 32 mEq/L   Glucose, Bld 167 (*) 70 - 99 mg/dL   BUN 13  6 - 23 mg/dL   Creatinine, Ser 9.14  0.50 - 1.35 mg/dL   Calcium 9.1  8.4 - 78.2 mg/dL   GFR calc non Af Amer 62 (*) >90 mL/min   GFR calc Af Amer 72 (*) >90 mL/min   Comment:            The eGFR has been calculated     using the CKD EPI equation.     This calculation has not been     validated in all clinical     situations.     eGFR's persistently     <90 mL/min signify     possible Chronic Kidney Disease.  PROTIME-INR     Status: None   Collection Time    07/09/12  2:30 PM      Result Value Range   Prothrombin Time 13.6  11.6 - 15.2 seconds   INR 1.05  0.00 - 1.49  GLUCOSE, CAPILLARY     Status: Abnormal   Collection Time    07/09/12  2:44 PM      Result Value Range   Glucose-Capillary 200 (*) 70 - 99 mg/dL  POCT I-STAT TROPONIN I     Status: None   Collection Time    07/09/12  2:45 PM      Result Value Range   Troponin i, poc 0.00  0.00 - 0.08 ng/mL   Comment 3            Comment: Due to the release kinetics of cTnI,     a negative result within the first hours     of the onset of symptoms does not rule out     myocardial infarction with certainty.     If myocardial infarction is still suspected,     repeat the test at appropriate intervals.  URINALYSIS, ROUTINE W REFLEX MICROSCOPIC     Status: None   Collection Time    07/09/12  6:17 PM      Result Value Range   Color, Urine YELLOW  YELLOW   APPearance CLEAR  CLEAR   Specific Gravity, Urine 1.014  1.005 - 1.030   pH 7.5  5.0 - 8.0   Glucose, UA NEGATIVE  NEGATIVE mg/dL   Hgb urine dipstick NEGATIVE  NEGATIVE   Bilirubin Urine NEGATIVE  NEGATIVE   Ketones, ur NEGATIVE  NEGATIVE mg/dL   Protein, ur NEGATIVE  NEGATIVE mg/dL    Urobilinogen, UA 1.0  0.0 - 1.0 mg/dL   Nitrite NEGATIVE  NEGATIVE   Leukocytes, UA NEGATIVE  NEGATIVE   Comment: MICROSCOPIC NOT DONE ON URINES WITH NEGATIVE PROTEIN, BLOOD, LEUKOCYTES, NITRITE, OR GLUCOSE <1000 mg/dL.  GLUCOSE, CAPILLARY     Status: Abnormal   Collection Time    07/09/12  8:00 PM      Result Value Range   Glucose-Capillary 154 (*) 70 - 99 mg/dL   Comment 1 Notify RN    HEMOGLOBIN A1C     Status: Abnormal   Collection Time    07/09/12  8:15 PM      Result Value Range   Hemoglobin A1C 6.5 (*) <5.7 %   Comment: (NOTE)                                                                               According to the ADA Clinical Practice Recommendations for 2011, when     HbA1c is used as a screening test:      >=6.5%   Diagnostic of Diabetes Mellitus               (if abnormal result is confirmed)     5.7-6.4%   Increased risk of developing Diabetes Mellitus     References:Diagnosis and Classification of Diabetes Mellitus,Diabetes     Care,2011,34(Suppl 1):S62-S69 and Standards of Medical Care in             Diabetes - 2011,Diabetes Care,2011,34 (Suppl 1):S11-S61.   Mean Plasma Glucose 140 (*) <117 mg/dL  GLUCOSE, CAPILLARY     Status: Abnormal   Collection Time    07/09/12 10:18 PM      Result Value Range   Glucose-Capillary 197 (*) 70 - 99 mg/dL   Comment 1 Notify RN    MRSA PCR SCREENING     Status: None   Collection Time    07/09/12 11:24 PM      Result Value Range   MRSA by PCR NEGATIVE  NEGATIVE   Comment:            The GeneXpert MRSA Assay (FDA     approved for NASAL specimens     only), is one component of a     comprehensive MRSA colonization     surveillance program. It is not     intended to diagnose MRSA     infection nor to guide or     monitor treatment for     MRSA infections.  BASIC METABOLIC PANEL     Status: Abnormal   Collection Time    07/10/12  5:00 AM      Result Value Range   Sodium 139  135 - 145 mEq/L   Potassium 3.9  3.5 - 5.1  mEq/L   Chloride 105  96 - 112 mEq/L   CO2 25  19 - 32 mEq/L   Glucose, Bld 120 (*) 70 - 99 mg/dL   BUN 14  6 - 23 mg/dL   Creatinine, Ser 3.24  0.50 - 1.35 mg/dL   Calcium 8.8  8.4 - 40.1 mg/dL   GFR calc non Af Amer 63 (*) >90 mL/min   GFR calc Af Amer 74 (*) >90 mL/min   Comment:            The eGFR has been calculated     using the CKD EPI equation.     This calculation has not been     validated in all clinical     situations.     eGFR's persistently     <90 mL/min signify     possible Chronic Kidney Disease.  CBC     Status: Abnormal   Collection Time    07/10/12  5:00 AM      Result Value Range  WBC 13.3 (*) 4.0 - 10.5 K/uL   RBC 4.54  4.22 - 5.81 MIL/uL   Hemoglobin 15.0  13.0 - 17.0 g/dL   HCT 16.1  09.6 - 04.5 %   MCV 95.4  78.0 - 100.0 fL   MCH 33.0  26.0 - 34.0 pg   MCHC 34.6  30.0 - 36.0 g/dL   RDW 40.9  81.1 - 91.4 %   Platelets 161  150 - 400 K/uL    Dg Thoracic Spine 2 View  07/09/2012  *RADIOLOGY REPORT*  Clinical Data: Back pain status post fall today.  THORACIC SPINE - 2 VIEW  Comparison: Chest radiographs 12/20/2011.  Findings: Patient is status post multilevel cervical fusion.  The thoracic alignment is stable with a mild convex right scoliosis. There is no evidence of acute fracture or paraspinal abnormality. Multiple paraspinal osteophytes are noted.  Patient is status post CABG.  Coronary artery stents are also noted.  IMPRESSION: No evidence of acute thoracic spine injury.   Original Report Authenticated By: Carey Bullocks, M.D.    Ct Head Wo Contrast  07/09/2012  *RADIOLOGY REPORT*  Clinical Data:  Altered mental status after falling today.  CT HEAD WITHOUT CONTRAST CT CERVICAL SPINE WITHOUT CONTRAST  Technique:  Multidetector CT imaging of the head and cervical spine was performed following the standard protocol without intravenous contrast.  Multiplanar CT image reconstructions of the cervical spine were also generated.  Comparison:  05/23/2007.  CT  HEAD  Findings: Right tentorial and parafalcine subdural hematoma, measuring 8 mm in maximum thickness on image number 21.  No associated mass effect.  Enlarged ventricles and subarachnoid spaces.  Patchy white matter low density in both cerebral hemispheres.  No skull fractures or paranasal sinus air-fluid levels.  Minimal left sphenoid sinus mucosal thickening.  IMPRESSION:  1.  Right tentorial and parafalcine subdural hematoma, as described above. 2.  Minimally progressive atrophy and chronic small vessel white matter ischemic changes. 3.  Minimal chronic sphenoid sinusitis.  CT CERVICAL SPINE  Findings: Interbody bone plug and anterior screw and plate fixation at the C4-C6 levels with normal alignment.  Solid interbody bone plug anterior fusion at the C3-4 level.  Anterior and posterior bony bridging at the C2-3 level.  Anterior and posterior spur formation at the C6-7 level and anterior spurs at the C7-T1 and T1- T2 levels.  Facet degenerative changes at multiple levels.  No prevertebral soft tissue swelling, fractures or subluxations. Bilateral carotid artery atheromatous calcifications.  IMPRESSION:  1.  No fracture or subluxation. 2.  Postoperative and degenerative changes, as described above. 3.  Bilateral carotid artery atheromatous calcifications.  Critical Value/emergent results were called by telephone at the time of interpretation on 07/09/2012 at 1514 hours to Dr. Laveda Norman, who verbally acknowledged these results.   Original Report Authenticated By: Beckie Salts, M.D.    Ct Cervical Spine Wo Contrast  07/09/2012  *RADIOLOGY REPORT*  Clinical Data:  Altered mental status after falling today.  CT HEAD WITHOUT CONTRAST CT CERVICAL SPINE WITHOUT CONTRAST  Technique:  Multidetector CT imaging of the head and cervical spine was performed following the standard protocol without intravenous contrast.  Multiplanar CT image reconstructions of the cervical spine were also generated.  Comparison:  05/23/2007.  CT  HEAD  Findings: Right tentorial and parafalcine subdural hematoma, measuring 8 mm in maximum thickness on image number 21.  No associated mass effect.  Enlarged ventricles and subarachnoid spaces.  Patchy white matter low density in both cerebral hemispheres.  No skull  fractures or paranasal sinus air-fluid levels.  Minimal left sphenoid sinus mucosal thickening.  IMPRESSION:  1.  Right tentorial and parafalcine subdural hematoma, as described above. 2.  Minimally progressive atrophy and chronic small vessel white matter ischemic changes. 3.  Minimal chronic sphenoid sinusitis.  CT CERVICAL SPINE  Findings: Interbody bone plug and anterior screw and plate fixation at the C4-C6 levels with normal alignment.  Solid interbody bone plug anterior fusion at the C3-4 level.  Anterior and posterior bony bridging at the C2-3 level.  Anterior and posterior spur formation at the C6-7 level and anterior spurs at the C7-T1 and T1- T2 levels.  Facet degenerative changes at multiple levels.  No prevertebral soft tissue swelling, fractures or subluxations. Bilateral carotid artery atheromatous calcifications.  IMPRESSION:  1.  No fracture or subluxation. 2.  Postoperative and degenerative changes, as described above. 3.  Bilateral carotid artery atheromatous calcifications.  Critical Value/emergent results were called by telephone at the time of interpretation on 07/09/2012 at 1514 hours to Dr. Laveda Norman, who verbally acknowledged these results.   Original Report Authenticated By: Beckie Salts, M.D.    Dg Shoulder Left  07/09/2012  *RADIOLOGY REPORT*  Clinical Data: Shoulder pain.  Fall.  LEFT SHOULDER - 2+ VIEW  Comparison: Cervical spine CT 07/09/2012  Findings: The mineralization and alignment are normal.  There is no evidence of acute fracture or dislocation.  The subacromial space is preserved.  There are moderate glenohumeral degenerative changes.  Patient is status post lower cervical fusion.  The left C6 screw is absent.   IMPRESSION: No acute osseous findings.  Moderate glenohumeral degenerative changes.   Original Report Authenticated By: Carey Bullocks, M.D.     Review of Systems  Constitutional: Negative for fever, chills, weight loss and malaise/fatigue.  HENT: Negative for hearing loss, ear pain, nosebleeds, tinnitus and ear discharge.  Eyes: Negative for blurred vision, double vision, photophobia and pain.  Respiratory: Negative for cough, hemoptysis and sputum production.  Cardiovascular: Negative for chest pain, palpitations, orthopnea and claudication.  Gastrointestinal: Negative for heartburn, nausea, vomiting and abdominal pain.  Genitourinary: Negative for dysuria, urgency and frequency.  Musculoskeletal: Positive for joint pain and falls. Negative for myalgias.  Left shoulder pain  Skin: Negative for itching and rash.  Neurological: Positive for loss of consciousness and headaches. Negative for dizziness, tingling, tremors, speech change and focal weakness.  Some lightheadedness. + LOC after fall this early afternoon ~ 1 pm  Endo/Heme/Allergies: Negative for environmental allergies. Does not bruise/bleed easily.  Psychiatric/Behavioral: Negative for depression, suicidal ideas and substance abuse.   Blood pressure 125/71, pulse 59, temperature 98.2 F (36.8 C), temperature source Oral, resp. rate 10, height 5\' 11"  (1.803 m), weight 96.6 kg (212 lb 15.4 oz), SpO2 94.00%. PE -  AAOx3 -  CN II-XII intact   - moves all 4 well,  FC all 4 ,   Neck supple, NT ,      Head normocephalic  Assessment/Plan: Pt with falx and tentorial SDH ,  Agree with admit to  Step down , stop anticoagulants  - repeat CT Tuesday am  Pollie Poma R, MD 07/10/2012, 7:06 AM

## 2012-07-10 NOTE — Progress Notes (Signed)
Utilization review completed.  

## 2012-07-10 NOTE — Progress Notes (Addendum)
Patient ID: Sean Lozano, male   DOB: 08/24/1933, 79 y.o.   MRN: [id number]   SUBJECTIVE:  The patient is stable today. He has known significant coronary disease. His last procedure did not include a drug-eluting stent. He did have a PCI. With clots associated with his lesions in the past, it has been recommended that he remain on aspirin and Plavix indefinitely. Clearly his aspirin and Plavix must be held as of today. We will continue to consult with the neurosurgery team for consideration of the earliest time that his dual antiplatelet therapy can be resumed.  The patient was on a ladder. He says that he does not remember falling. Over time we will also have to consider whether there was any true syncope involved. For now he is being monitored.   Filed Vitals:   07/10/12 0445 07/10/12 0545 07/10/12 0725 07/10/12 0744  BP:      Pulse: 62 59    Temp:   97.7 F (36.5 C)   TempSrc:   Oral   Resp: 14 10    Height:      Weight:      SpO2: 96% 94%  96%    Intake/Output Summary (Last 24 hours) at 07/10/12 0758 Last data filed at 07/10/12 0700  Gross per 24 hour  Intake    600 ml  Output   1800 ml  Net  -1200 ml    LABS: Basic Metabolic Panel:  Recent Labs  40/98/11 1430 07/10/12 0500  NA 135 139  K 3.9 3.9  CL 99 105  CO2 25 25  GLUCOSE 167* 120*  BUN 13 14  CREATININE 1.11 1.09  CALCIUM 9.1 8.8   Liver Function Tests: No results found for this basename: AST, ALT, ALKPHOS, BILITOT, PROT, ALBUMIN,  in the last 72 hours No results found for this basename: LIPASE, AMYLASE,  in the last 72 hours CBC:  Recent Labs  07/09/12 1430 07/10/12 0500  WBC 13.0* 13.3*  NEUTROABS 9.9*  --   HGB 15.7 15.0  HCT 43.9 43.3  MCV 94.6 95.4  PLT 151 161   Cardiac Enzymes: No results found for this basename: CKTOTAL, CKMB, CKMBINDEX, TROPONINI,  in the last 72 hours BNP: No components found with this basename: POCBNP,  D-Dimer: No results found for this basename: DDIMER,  in the  last 72 hours Hemoglobin A1C:  Recent Labs  07/09/12 2015  HGBA1C 6.5*   Fasting Lipid Panel: No results found for this basename: CHOL, HDL, LDLCALC, TRIG, CHOLHDL, LDLDIRECT,  in the last 72 hours Thyroid Function Tests: No results found for this basename: TSH, T4TOTAL, FREET3, T3FREE, THYROIDAB,  in the last 72 hours  RADIOLOGY: Dg Thoracic Spine 2 View  07/09/2012  *RADIOLOGY REPORT*  Clinical Data: Back pain status post fall today.  THORACIC SPINE - 2 VIEW  Comparison: Chest radiographs 12/20/2011.  Findings: Patient is status post multilevel cervical fusion.  The thoracic alignment is stable with a mild convex right scoliosis. There is no evidence of acute fracture or paraspinal abnormality. Multiple paraspinal osteophytes are noted.  Patient is status post CABG.  Coronary artery stents are also noted.  IMPRESSION: No evidence of acute thoracic spine injury.   Original Report Authenticated By: Carey Bullocks, M.D.    Ct Head Wo Contrast  07/09/2012  *RADIOLOGY REPORT*  Clinical Data:  Altered mental status after falling today.  CT HEAD WITHOUT CONTRAST CT CERVICAL SPINE WITHOUT CONTRAST  Technique:  Multidetector CT imaging of the head and cervical  spine was performed following the standard protocol without intravenous contrast.  Multiplanar CT image reconstructions of the cervical spine were also generated.  Comparison:  05/23/2007.  CT HEAD  Findings: Right tentorial and parafalcine subdural hematoma, measuring 8 mm in maximum thickness on image number 21.  No associated mass effect.  Enlarged ventricles and subarachnoid spaces.  Patchy white matter low density in both cerebral hemispheres.  No skull fractures or paranasal sinus air-fluid levels.  Minimal left sphenoid sinus mucosal thickening.  IMPRESSION:  1.  Right tentorial and parafalcine subdural hematoma, as described above. 2.  Minimally progressive atrophy and chronic small vessel white matter ischemic changes. 3.  Minimal chronic  sphenoid sinusitis.  CT CERVICAL SPINE  Findings: Interbody bone plug and anterior screw and plate fixation at the C4-C6 levels with normal alignment.  Solid interbody bone plug anterior fusion at the C3-4 level.  Anterior and posterior bony bridging at the C2-3 level.  Anterior and posterior spur formation at the C6-7 level and anterior spurs at the C7-T1 and T1- T2 levels.  Facet degenerative changes at multiple levels.  No prevertebral soft tissue swelling, fractures or subluxations. Bilateral carotid artery atheromatous calcifications.  IMPRESSION:  1.  No fracture or subluxation. 2.  Postoperative and degenerative changes, as described above. 3.  Bilateral carotid artery atheromatous calcifications.  Critical Value/emergent results were called by telephone at the time of interpretation on 07/09/2012 at 1514 hours to Dr. Laveda Norman, who verbally acknowledged these results.   Original Report Authenticated By: Beckie Salts, M.D.    Ct Cervical Spine Wo Contrast  07/09/2012  *RADIOLOGY REPORT*  Clinical Data:  Altered mental status after falling today.  CT HEAD WITHOUT CONTRAST CT CERVICAL SPINE WITHOUT CONTRAST  Technique:  Multidetector CT imaging of the head and cervical spine was performed following the standard protocol without intravenous contrast.  Multiplanar CT image reconstructions of the cervical spine were also generated.  Comparison:  05/23/2007.  CT HEAD  Findings: Right tentorial and parafalcine subdural hematoma, measuring 8 mm in maximum thickness on image number 21.  No associated mass effect.  Enlarged ventricles and subarachnoid spaces.  Patchy white matter low density in both cerebral hemispheres.  No skull fractures or paranasal sinus air-fluid levels.  Minimal left sphenoid sinus mucosal thickening.  IMPRESSION:  1.  Right tentorial and parafalcine subdural hematoma, as described above. 2.  Minimally progressive atrophy and chronic small vessel white matter ischemic changes. 3.  Minimal chronic  sphenoid sinusitis.  CT CERVICAL SPINE  Findings: Interbody bone plug and anterior screw and plate fixation at the C4-C6 levels with normal alignment.  Solid interbody bone plug anterior fusion at the C3-4 level.  Anterior and posterior bony bridging at the C2-3 level.  Anterior and posterior spur formation at the C6-7 level and anterior spurs at the C7-T1 and T1- T2 levels.  Facet degenerative changes at multiple levels.  No prevertebral soft tissue swelling, fractures or subluxations. Bilateral carotid artery atheromatous calcifications.  IMPRESSION:  1.  No fracture or subluxation. 2.  Postoperative and degenerative changes, as described above. 3.  Bilateral carotid artery atheromatous calcifications.  Critical Value/emergent results were called by telephone at the time of interpretation on 07/09/2012 at 1514 hours to Dr. Laveda Norman, who verbally acknowledged these results.   Original Report Authenticated By: Beckie Salts, M.D.    Dg Shoulder Left  07/09/2012  *RADIOLOGY REPORT*  Clinical Data: Shoulder pain.  Fall.  LEFT SHOULDER - 2+ VIEW  Comparison: Cervical spine CT 07/09/2012  Findings:  The mineralization and alignment are normal.  There is no evidence of acute fracture or dislocation.  The subacromial space is preserved.  There are moderate glenohumeral degenerative changes.  Patient is status post lower cervical fusion.  The left C6 screw is absent.  IMPRESSION: No acute osseous findings.  Moderate glenohumeral degenerative changes.   Original Report Authenticated By: Carey Bullocks, M.D.     PHYSICAL EXAM  Patient is oriented to person time and place. Affect is normal. Cardiac exam reveals S1 and S2.     ASSESSMENT AND PLAN:    SDH (subdural hematoma)      Since being watched carefully in the hospital.    CAD (coronary artery disease)    It will be very difficult to know exactly when the patient can resume his aspirin and Plavix. This is very important for him. I will continue to work with the  neurosurgical team concerning this question.   Willa Rough 07/10/2012 7:58 AM

## 2012-07-10 NOTE — ED Provider Notes (Signed)
Medical screening examination/treatment/procedure(s) were performed by non-physician practitioner and as supervising physician I was immediately available for consultation/collaboration.  Takao Lizer T Cleon Thoma, MD 07/10/12 2000 

## 2012-07-11 ENCOUNTER — Inpatient Hospital Stay (HOSPITAL_COMMUNITY): Payer: Medicare Other

## 2012-07-11 LAB — BASIC METABOLIC PANEL
BUN: 14 mg/dL (ref 6–23)
CO2: 25 mEq/L (ref 19–32)
Calcium: 8.8 mg/dL (ref 8.4–10.5)
Chloride: 104 mEq/L (ref 96–112)
Creatinine, Ser: 1.03 mg/dL (ref 0.50–1.35)
GFR calc Af Amer: 79 mL/min — ABNORMAL LOW (ref >90)
GFR calc non Af Amer: 68 mL/min — ABNORMAL LOW (ref >90)
Glucose, Bld: 125 mg/dL — ABNORMAL HIGH (ref 70–99)
Potassium: 3.9 mEq/L (ref 3.5–5.1)
Sodium: 139 mEq/L (ref 135–145)

## 2012-07-11 LAB — CBC
HCT: 42.7 % (ref 39.0–52.0)
Hemoglobin: 14.6 g/dL (ref 13.0–17.0)
MCH: 33 pg (ref 26.0–34.0)
MCHC: 34.2 g/dL (ref 30.0–36.0)
MCV: 96.6 fL (ref 78.0–100.0)
Platelets: 152 10*3/uL (ref 150–400)
RBC: 4.42 MIL/uL (ref 4.22–5.81)
RDW: 13.4 % (ref 11.5–15.5)
WBC: 12.2 10*3/uL — ABNORMAL HIGH (ref 4.0–10.5)

## 2012-07-11 LAB — GLUCOSE, CAPILLARY
Glucose-Capillary: 133 mg/dL — ABNORMAL HIGH (ref 70–99)
Glucose-Capillary: 146 mg/dL — ABNORMAL HIGH (ref 70–99)
Glucose-Capillary: 167 mg/dL — ABNORMAL HIGH (ref 70–99)
Glucose-Capillary: 151 mg/dL — ABNORMAL HIGH (ref 70–99)

## 2012-07-11 MED ORDER — SODIUM CHLORIDE 0.9 % IJ SOLN
INTRAMUSCULAR | Status: AC
  Filled 2012-07-11: qty 10

## 2012-07-11 NOTE — Evaluation (Signed)
Physical Therapy Evaluation Patient Details Name: Sean Lozano MRN: [id number] DOB: 11-06-1933 Today's Date: 07/11/2012 Time: 1355-1440 PT Time Calculation (min): 45 min  PT Assessment / Plan / Recommendation Clinical Impression  Pt adm s/p fall backwards from a 3 foot height, with hitting head, +LOC, Rt parafalcine and tentorial SDH, and Rt & Lt BPPV. Pt becomes very nauseous and unsteady when vertigo is triggered with movement. Reports vertigo is completely gone when he lies still, and returns for brief periods (up to 15 seconds whenever he moves). Pt's previous falls all have been mechanical falls, however will benefit from PT to address his BPPV and the deficits listed below.    PT Assessment  Patient needs continued PT services    Follow Up Recommendations  Home health PT;Supervision for mobility/OOB    Does the patient have the potential to tolerate intense rehabilitation      Barriers to Discharge None      Equipment Recommendations  Rolling walker with 5" wheels    Recommendations for Other Services     Frequency Min 4X/week    Precautions / Restrictions Precautions Precautions: Fall   Pertinent Vitals/Pain Beginning of session, pt reported really no headache, feeling upper and lower back pain 2-3/10 By end of session, still without headache, back pain 4-5/10 and nausea 8/10 (RN in to provide Zofran)      Mobility  Bed Mobility Bed Mobility: Rolling Right;Right Sidelying to Sit;Sit to Supine Rolling Right: 4: Min assist (during Epley maneuver) Right Sidelying to Sit: 3: Mod assist;HOB flat (during Epley maneuver) Sit to Supine: 5: Supervision Details for Bed Mobility Assistance: bed mobility in conjunction with BPPV treatment Transfers Transfers: Sit to Stand;Stand to Sit;Stand Pivot Transfers Sit to Stand: 4: Min guard Stand to Sit: 4: Min guard Stand Pivot Transfers: 4: Min guard Details for Transfer Assistance: all x2 reps; pt hesitant to move due to fear  of triggering vertigo Ambulation/Gait Ambulation/Gait Assistance: Not tested (comment)    Vestibular Assessment  Pt reported dizziness whenever he moves (see Subjective). Attempted Hallpike-Dix testing lying backwards from sitting on EOB and pt unable to extend upper thorax and neck to obtain correct position. Performed modified Hallpike to pt's Rt with + spinning and nystagmus (pt initially closed eyes, however noted upbeating when eyes opened and vertigo lasted ~20 seconds). On return to sitting, pt again with vertigo of short duration (~20 sec), however nausea became more intense (~8/10 and required 4 minutes to subside). Modified Hallpike-Dix to pt's Lt with + spinning of lesser intensity 6/10 however longer duration 60 seconds. On return to sitting, pt very nauseous and RN called and in to provide IV Zofran. Pt remained very nauseous after sitting EOB and using gaze stabilzation vs eyes closed x 8 minutes. Assisted pt to supine (per pt request). Agreed to allow pt time to rest before initiating treatment maneuvers for BPPV.   PT Diagnosis: Difficulty walking;Acute pain  PT Problem List: Decreased activity tolerance;Decreased balance;Decreased mobility;Pain;Other (comment) (vertigo) PT Treatment Interventions: DME instruction;Gait training;Stair training;Functional mobility training;Therapeutic activities;Therapeutic exercise;Balance training;Patient/family education   PT Goals Acute Rehab PT Goals PT Goal Formulation: With patient/family Time For Goal Achievement: 07/18/12 Potential to Achieve Goals: Good Pt will Roll Supine to Right Side: with modified independence;Other (comment) (dizziness <3/10) PT Goal: Rolling Supine to Right Side - Progress: Goal set today Pt will go Supine/Side to Sit: with modified independence;Other (comment) (dizziness <3/10) PT Goal: Supine/Side to Sit - Progress: Goal set today Pt will go Sit to Supine/Side:  with modified independence;Other (comment) (dizziness  <3/10) PT Goal: Sit to Supine/Side - Progress: Goal set today Pt will go Sit to Stand: with supervision;Other (comment) (dizziness <3/10) PT Goal: Sit to Stand - Progress: Goal set today Pt will Ambulate: >150 feet;with supervision;with least restrictive assistive device;Other (comment) (dizziness <3/10) PT Goal: Ambulate - Progress: Goal set today Additional Goals Additional Goal #1: Pt will verbalize gaze stabilization techniques to use for control of symptoms. PT Goal: Additional Goal #1 - Progress: Goal set today  Visit Information  Last PT Received On: 07/11/12 Assistance Needed: +1    Subjective Data  Subjective: Pt reports he gets dizzy everytime he moves. Reports initially it felt like he was floating, now feels like spinning. Reports it lasts ~15 seconds at a time. Patient Stated Goal: to feel better   Prior Functioning  Home Living Lives With: Spouse Available Help at Discharge: Family;Available 24 hours/day Home Adaptive Equipment: Walker - standard Additional Comments: Wife has narcolepsy and does not drive Prior Function Level of Independence: Independent Able to Take Stairs?: Reciprically Driving: Yes Vocation: Retired Comments: Pt reports 3 falls this month, 1st he slipped on ice (did not hit head) 2nd in restaurant and thought there was a wall behind the curtain and went to lean back against wall and fell through curtain (did hit his head, no LOC), and 3rd recent fall (he does not remember falling or next ~15 minutes and ?g syncope/cardiac cause). Communication Communication: No difficulties    Cognition  Cognition Overall Cognitive Status: Appears within functional limits for tasks assessed/performed Arousal/Alertness: Awake/alert Orientation Level: Appears intact for tasks assessed Behavior During Session: [facility] for tasks performed Cognition - Other Comments: hesitates when speaking (? word finding, however pt and wife notice no change from baseline)     Extremity/Trunk Assessment Right Lower Extremity Assessment RLE ROM/Strength/Tone: [facility] for tasks assessed Left Lower Extremity Assessment LLE ROM/Strength/Tone: [facility] for tasks assessed Trunk Assessment Trunk Assessment: Other exceptions Trunk Exceptions: limited neck ROM (fused C3-5)   Balance Balance Balance Assessed: Yes Static Sitting Balance Static Sitting - Balance Support: Bilateral upper extremity supported;Feet supported Static Sitting - Level of Assistance: 4: Min assist;3: Mod assist Static Sitting - Comment/# of Minutes: on initial sitting (x3 during testing), pt with vertigo and +sway requiring assist to maintian balance; as symptoms subsided, pt progressed to [employer] Standing - Balance Support: Left upper extremity supported Static Standing - Level of Assistance: 4: Min assist Static Standing - Comment/# of Minutes: minguard assist due to vertigo  End of Session PT - End of Session Activity Tolerance: Treatment limited secondary to medical complications (Comment) (vertigo, nausea) Patient left: in bed;with call bell/phone within reach;with family/visitor present Nurse Communication: Other (comment) (+BPPV and to return after pt's system settles down)  GP     Kenda Kloehn 07/11/2012, 4:18 PM   07/11/2012 Veda Canning, PT Pager: [phone]

## 2012-07-11 NOTE — Progress Notes (Signed)
Minimal headache. No other problems.  Awake and alert. Oriented and appropriate. Cranial nerve function intact. Motor 5/5 bilaterally.  Followup head CT scan stable with small amount of parafalcine hemorrhage extending down over the tentorium. No evidence of cortical contusion edema or other significant issue.  Status post mechanical fall with resultant closed head injury complicated by antiplatelet agents use. Followup head CT scan stable. May mobilize without restriction from my standpoint. Patient needs to remain off antiplatelet therapy for release 2 weeks. Followup with neurosurgery as needed as an outpatient.

## 2012-07-11 NOTE — Progress Notes (Signed)
[facility] Progress Note [place] TEAM 1 - Stepdown/ICU TEAM   Sean Lozano:[id number] DOB: 1933/08/19 DOA: 07/09/2012 PCP: Oliver Barre, MD  Brief narrative: 79 year old male patient with history of coronary artery disease and past PCI's on Plavix and aspirin. He was sent to the hospital after a mechanical fall. He reported standing on a 3 foot tall entertainment center to adjust his screen TV when he lost his balance fell backward and hit his head against a concrete floor. He only remembers waking up in the custody of EMS after the fall.  He had no dizziness or lightheadedness prior to the fall. He endorses this his third fall in the past month. All were mechanical related. In the ER CT scan revealed right tentorial and parafalcine subdural hematoma but no skull fracture nor any subluxation or fracture in the cervical spine. Left shoulder x-ray and thoracic spine were also normal. Neurosurgery was consulted by the emergency room physician who recommended medical admission for observation to the step down unit.  Assessment/Plan:  SDH (subdural hematoma) -NS following -Plavix/ASA on hold (see below)- NS recommends for TOTAL 2 weeks -FU CT head 4/1 demonstrated stability of SDH -clinically stable on exam today - denies HA but endorsed dizziness which began after SDH -PT/OT evaluation for gait stability/dizziness post SDH  CAD (coronary artery disease) -pt with history of NSTEMI 9/13 after Plavix and ASA were held 5 days pre procedure;because of this both the patient and his son were reluctant to allow the Medicine or NS doctors to hold these meds before current situation had been reviewed by the pt's cardiologist -Dr. Tresa Endo rapidly evaluated the pt after admission and discussed in great detail the rationale behind holding Plavix and ASA in the current situation explaining the neurosurgical risk of continuing for now outweighs the cardiac benefit:  "There is a risk of MI and/or  instent restenosis while holding dual-antiplatelet therapy as I discussed with Mr. Merkel and his family but at this juncture, there is a much higher risk of expansion of his subdural hematoma. His last stent was actually in late 2012 so he is greater than 1 year after stent placement but his current medical plan is indefinite dual-antiplatelet therapy given his MI/instent restenosis while off asa/plavix for 4-5 days. I spoke with Mr. Elman, both sons and his wife about the plans as delineated here and they understand the careful balance of intracranial bleeding and coronary artery disease treatment. We will continue beta-blocker."  -cont Beta blocker  Chronic diastolic congestive heart failure, NYHA class 1/ECHO 2010 -compensated at present  BENIGN PROSTATIC HYPERTROPHY -monitor for urinary retention -cont Flomax -with dizziness check OVS   Type II or unspecified type diabetes mellitus without mention of complication -HgbA1c 6.5 -diet controlled at home/cont SSI here  Leukocytosis -likely due to SDH - follow  DEPRESSION  GERD -PPI  Hyperlipidemia -Pravachol/Niacin  DVT prophylaxis: SCDs only due to ICH Code Status: Full Family Communication: Patient Disposition Plan: Transfer to Neuro telemetry Isolation: None  Consultants: Neurosurgery Cardiology  Procedures: None  Antibiotics: None  HPI/Subjective: Patient alert without any specific complaints verbalized. No chest pain or shortness of breath.  Denies HA or focal neurologic complaints.  Objective: Blood pressure 124/81, pulse 64, temperature 98.5 F (36.9 C), temperature source Oral, resp. rate 18, height 5\' 11"  (1.803 m), weight 96.6 kg (212 lb 15.4 oz), SpO2 93.00%.  Intake/Output Summary (Last 24 hours) at 07/11/12 1405 Last data filed at 07/11/12 0700  Gross per 24 hour  Intake  1140 ml  Output    200 ml  Net    940 ml    Exam: General: No acute respiratory distress Lungs: Clear to auscultation  bilaterally without wheezes or crackles, RA Cardiovascular: Regular rate and rhythm without murmur gallop or rub normal S1 and S2, no peripheral edema or JVD Abdomen: Nontender, nondistended, soft, bowel sounds positive, no rebound, no ascites, no appreciable mass Musculoskeletal: No significant cyanosis, clubbing of bilateral lower extremities Neurological: Alert and oriented x 4, moves all extremities x 4 without focal neurological deficits, CN 2-13 intact  Data Reviewed: Basic Metabolic Panel:  Recent Labs Lab 07/09/12 1430 07/10/12 0500 07/11/12 0430  NA 135 139 139  K 3.9 3.9 3.9  CL 99 105 104  CO2 25 25 25   GLUCOSE 167* 120* 125*  BUN 13 14 14   CREATININE 1.11 1.09 1.03  CALCIUM 9.1 8.8 8.8   CBC:  Recent Labs Lab 07/09/12 1430 07/10/12 0500 07/11/12 0430  WBC 13.0* 13.3* 12.2*  NEUTROABS 9.9*  --   --   HGB 15.7 15.0 14.6  HCT 43.9 43.3 42.7  MCV 94.6 95.4 96.6  PLT 151 161 152   CBG:  Recent Labs Lab 07/10/12 1242 07/10/12 1652 07/10/12 2108 07/11/12 0827 07/11/12 1148  GLUCAP 157* 114* 156* 133* 146*    Recent Results (from the past 240 hour(s))  MRSA PCR SCREENING     Status: None   Collection Time    07/09/12 11:24 PM      Result Value Range Status   MRSA by PCR NEGATIVE  NEGATIVE Final   Comment:            The GeneXpert MRSA Assay (FDA     approved for NASAL specimens     only), is one component of a     comprehensive MRSA colonization     surveillance program. It is not     intended to diagnose MRSA     infection nor to guide or     monitor treatment for     MRSA infections.     Studies:  Recent x-ray studies have been reviewed in detail by the Attending Physician  Scheduled Meds:  Reviewed in detail by the Attending Physician   Junious Silk, ANP [facility] Office  [phone] Pager [phone]  On-Call/Text Page:      [email or web address]      password [email or web address]  If 7PM-7AM, please contact  night-coverage [email or web address] Password [email or web address] 07/11/2012, 2:05 PM   LOS: 2 days    I have examined the patient, reviewed the chart and modified the above note which I agree with.   Jennifermarie Franzen,MD [phone] 07/11/2012, 8:43 PM

## 2012-07-11 NOTE — Progress Notes (Signed)
Physical Therapy Treatment Patient Details Name: Sean Lozano MRN: [id number] DOB: 08-Jan-1934 Today's Date: 07/11/2012 Time: 1455-1530 PT Time Calculation (min): 35 min  PT Assessment / Plan / Recommendation Comments on Treatment Session  79 yo adm s/p fall with Rt parafalcine and tentorial SDH with +BPPV bil posterior canals. Pt persevered through the Epley maneuver despite incr nausea. All questions addressed.    Follow Up Recommendations  Home health PT;Supervision for mobility/OOB     Does the patient have the potential to tolerate intense rehabilitation     Barriers to Discharge None      Equipment Recommendations  Rolling walker with 5" wheels    Recommendations for Other Services    Frequency Min 4X/week   Plan Discharge plan remains appropriate;Frequency remains appropriate    Precautions / Restrictions Precautions Precautions: Fall   Pertinent Vitals/Pain Neck and back pain during Epley maneuver for BPPV, otherwise denied pain    Mobility  Bed Mobility Bed Mobility: Rolling Right;Right Sidelying to Sit;Sit to Supine Rolling Right: 4: Min assist Right Sidelying to Sit: 3: Mod assist;HOB flat Sit to Supine: 5: Supervision Details for Bed Mobility Assistance: bed mobility in conjunction with BPPV treatment Transfers Transfers: Sit to Stand;Stand to Sit;Stand Pivot Transfers Sit to Stand: 4: Min guard Stand to Sit: 4: Min guard Stand Pivot Transfers: 4: Min guard Details for Transfer Assistance: after cannalith repositioning, pt denied vertigo when he stood up to move to the chair Ambulation/Gait Ambulation/Gait Assistance: Not tested (comment)    Vestibular Rehab Performed treatment for Lt BPPV utilizing hospital bed to first move towards upright position (reverse trendelenburg) and then rotated pt's head to his Lt and placed in trendelenburg (-10 degrees), vertigo lasted <30 seconds; rotated his head to his Rt with +symptoms x 15Seconds; rolled onto his right  with head rotated to Rt with incr in vertigo. Symptoms began to subside at ~ 75 seconds and suddenly intensified for ~15 seconds and subsided. Returned to upright sitting.Assisted to sit up in the recliner with pt, wife, and RN educated on pt sitting up x 1 hour. Educated pt on probable need to treat the other ear on 4/2.   PT Diagnosis: Difficulty walking;Acute pain  PT Problem List: Decreased activity tolerance;Decreased balance;Decreased mobility;Pain;Other (comment) (vertigo) PT Treatment Interventions: DME instruction;Gait training;Stair training;Functional mobility training;Therapeutic activities;Therapeutic exercise;Balance training;Patient/family education   PT Goals Acute Rehab PT Goals PT Goal Formulation: With patient/family Time For Goal Achievement: 07/18/12 Potential to Achieve Goals: Good Pt will Roll Supine to Right Side: with modified independence;Other (comment) PT Goal: Rolling Supine to Right Side - Progress: Progressing toward goal Pt will go Supine/Side to Sit: with modified independence;Other (comment) PT Goal: Supine/Side to Sit - Progress: Progressing toward goal Pt will go Sit to Supine/Side: with modified independence;Other (comment) (dizziness <3/10) PT Goal: Sit to Supine/Side - Progress: Goal set today Pt will go Sit to Stand: with supervision;Other (comment) PT Goal: Sit to Stand - Progress: Progressing toward goal Pt will Ambulate: >150 feet;with supervision;with least restrictive assistive device;Other (comment) PT Goal: Ambulate - Progress: Progressing toward goal Additional Goals Additional Goal #1: Pt will verbalize gaze stabilization techniques to use for control of symptoms. PT Goal: Additional Goal #1 - Progress: Progressing toward goal  Visit Information  Last PT Received On: 07/11/12 Assistance Needed: +1    Subjective Data  Subjective: "I'll try anything once" with regards to whether he wanted to try treatment for BPPV Patient Stated Goal: to  feel better   Cognition  Cognition  Overall Cognitive Status: Appears within functional limits for tasks assessed/performed Arousal/Alertness: Awake/alert Orientation Level: Appears intact for tasks assessed Behavior During Session: [facility] for tasks performed Cognition - Other Comments: hesitates when speaking (? word finding, however pt and wife notice no change from baseline)    Balance  Balance Balance Assessed: Yes Static Sitting Balance Static Sitting - Balance Support: Bilateral upper extremity supported;Feet supported Static Sitting - Level of Assistance: 4: Min assist;3: Mod assist Static Sitting - Comment/# of Minutes: on initial sitting (x3 during testing), pt with vertigo and +sway requiring assist to maintian balance; as symptoms subsided, pt progressed to minguard assist Static Standing Balance Static Standing - Balance Support: Left upper extremity supported Static Standing - Level of Assistance: 4: Min assist Static Standing - Comment/# of Minutes: minguard assist due to vertigo  End of Session PT - End of Session Activity Tolerance: Treatment limited secondary to medical complications (Comment) (nausea and vertigo) Patient left: in chair;with call bell/phone within reach;with family/visitor present Nurse Communication: Mobility status;Precautions   GP     Teon Hudnall 07/11/2012, 4:35 PM  07/11/2012 Veda Canning, PT Pager: [phone]

## 2012-07-11 NOTE — Progress Notes (Addendum)
Orthostatic BP: lying 121/68, sitting 123/79, standing 118/75  Pt to be TX to 3W-31, VSS, called report. Family notified of TX.

## 2012-07-12 LAB — GLUCOSE, CAPILLARY
Glucose-Capillary: 110 mg/dL — ABNORMAL HIGH (ref 70–99)
Glucose-Capillary: 115 mg/dL — ABNORMAL HIGH (ref 70–99)
Glucose-Capillary: 133 mg/dL — ABNORMAL HIGH (ref 70–99)
Glucose-Capillary: 132 mg/dL — ABNORMAL HIGH (ref 70–99)

## 2012-07-12 LAB — TROPONIN I: Troponin I: <0.3 ng/mL (ref <0.30)

## 2012-07-12 MED ORDER — NITROGLYCERIN 0.4 MG SL SUBL
SUBLINGUAL_TABLET | SUBLINGUAL | Status: AC
  Administered 2012-07-12: 20:00:00
  Filled 2012-07-12: qty 25

## 2012-07-12 NOTE — Progress Notes (Signed)
OT Cancellation Note  Patient Details Name: Sean Lozano MRN: [id number] DOB: 1933/09/27   Cancelled Treatment:     Currently with [name] for BPPV treatment. Ot to reattempt later today  Lucile Shutters Pager: [phone]  07/12/2012, 11:28 AM

## 2012-07-12 NOTE — Progress Notes (Signed)
Pt resting in bed and c/o of non radiating CP. V/S stable. EKG obtained. PA and NP on call made aware. New orders received. 1 SL nitro given which relieved pt's pain. Will cont to monitor pt.

## 2012-07-12 NOTE — Evaluation (Signed)
Occupational Therapy Evaluation Patient Details Name: Sean Lozano MRN: [id number] DOB: 1933/06/27 Today's Date: 07/12/2012 Time: 8295-6213 OT Time Calculation (min): 13 min  OT Assessment / Plan / Recommendation Clinical Impression  79 yo male  s/p fall backwards from a 3 foot height, with hitting head, +LOC, Rt parafalcine and tentorial SDH, and Rt & Lt BPPV.  Ot to follow acutely. Recommend follow up with outpatient    OT Assessment  Patient needs continued OT Services    Follow Up Recommendations  Outpatient OT    Barriers to Discharge      Equipment Recommendations  None recommended by OT    Recommendations for Other Services    Frequency  Min 2X/week    Precautions / Restrictions Precautions Precautions: Fall Restrictions Weight Bearing Restrictions: No   Pertinent Vitals/Pain Nausea     ADL  Grooming: Wash/dry hands;Min guard Where Assessed - Grooming: Unsupported standing Toilet Transfer: Minimal assistance Toilet Transfer Method: Sit to stand Toilet Transfer Equipment: Raised toilet seat with arms (or 3-in-1 over toilet) Equipment Used: Rolling walker Transfers/Ambulation Related to ADLs: Pt with x1 LOB with ambulation to the sink ADL Comments: Pt educated on gaze stabilization for mobility to help with nystagmus. pt able to complete bed mobility and ambulate to the sink. Upon returning supine pt shifted eyes to the right and nystagmus noted.    OT Diagnosis: Generalized weakness  OT Problem List: Decreased activity tolerance;Impaired balance (sitting and/or standing);Decreased knowledge of use of DME or AE;Decreased knowledge of precautions OT Treatment Interventions: Self-care/ADL training;DME and/or AE instruction;Therapeutic activities;Patient/family education;Balance training   OT Goals Acute Rehab OT Goals OT Goal Formulation: With patient/family Time For Goal Achievement: 07/26/12 Potential to Achieve Goals: Good ADL Goals Pt Will Perform Lower  Body Bathing: with modified independence;Sit to stand from chair ADL Goal: Lower Body Bathing - Progress: Goal set today Pt Will Perform Lower Body Dressing: with modified independence;Sit to stand from chair ADL Goal: Lower Body Dressing - Progress: Goal set today Pt Will Transfer to Toilet: with modified independence;3-in-1;Ambulation ADL Goal: Toilet Transfer - Progress: Goal set today Pt Will Perform Toileting - Hygiene: with modified independence;Sit to stand from 3-in-1/toilet ADL Goal: Toileting - Hygiene - Progress: Goal set today  Visit Information  Last OT Received On: 07/12/12 Assistance Needed: +1    Subjective Data  Subjective: "I feel worse today than yesterday" Patient Stated Goal: to go home and have this better   Prior Functioning     Home Living Lives With: Spouse Available Help at Discharge: Family;Available 24 hours/day Type of Home: House Bathroom Toilet: Standard Home Adaptive Equipment: Walker - standard Additional Comments: Wife has narcolepsy and does not drive Prior Function Level of Independence: Independent Able to Take Stairs?: Reciprically Driving: Yes Vocation: Retired Comments: Pt reports 3 falls this month, 1st he slipped on ice (did not hit head) 2nd in restaurant and thought there was a wall behind the curtain and went to lean back against wall and fell through curtain (did hit his head, no LOC), and 3rd recent fall (he does not remember falling or next ~15 minutes and ?g syncope/cardiac cause Communication Communication: No difficulties Dominant Hand: Right         Vision/Perception Vision - History Baseline Vision: No visual deficits Patient Visual Report: No change from baseline   Cognition  Cognition Overall Cognitive Status: Appears within functional limits for tasks assessed/performed Arousal/Alertness: Awake/alert Orientation Level: Appears intact for tasks assessed Behavior During Session: [facility] for tasks performed  Extremity/Trunk Assessment Right Upper Extremity Assessment RUE ROM/Strength/Tone: Within functional levels RUE Coordination: WFL - gross/fine motor Left Upper Extremity Assessment LUE ROM/Strength/Tone: Within functional levels LUE Coordination: WFL - gross/fine motor     Mobility Bed Mobility Bed Mobility: Left Sidelying to Sit;Sit to Supine;Rolling Left Rolling Right: 5: Supervision;With rail Rolling Left: 5: Supervision Right Sidelying to Sit: 5: Supervision;With rails;HOB elevated Left Sidelying to Sit: 5: Supervision;HOB flat Sit to Supine: 5: Supervision;With rail Details for Bed Mobility Assistance: pt with decr nystagmus fixating eyes on non moving object  Transfers Sit to Stand: 5: Supervision;From bed Stand to Sit: 5: Supervision;To chair/3-in-1 Details for Transfer Assistance: after cannalith repositioning, pt denied vertigo when he stood up to move to the chair     Exercise Other Exercises Other Exercises: Pt static standing at sink to wash hands   Balance Static Sitting Balance Static Sitting - Balance Support: Feet supported;Bilateral upper extremity supported Static Sitting - Level of Assistance: 4: Min assist Static Sitting - Comment/# of Minutes: few moments after repositioning upon sitting provided min support for balance due to dizziness, then able to sit unsupported   End of Session OT - End of Session Activity Tolerance: Patient tolerated treatment well Patient left: in bed;with call bell/phone within reach;with family/visitor present Nurse Communication: Mobility status;Precautions  GO     Lucile Shutters 07/12/2012, 3:01 PM Pager: [phone]

## 2012-07-12 NOTE — Progress Notes (Signed)
Physical Therapy Treatment Patient Details Name: Sean Lozano MRN: [id number] DOB: 1933-11-26 Today's Date: 07/12/2012 Time: 1100-1155 PT Time Calculation (min): 55 min  PT Assessment / Plan / Recommendation Comments on Treatment Session  Patient demonstrated bilateral BPPV with treatment for both sides provided today.  Noted increased intensity with left side testing and treatment which improved with repititions today.  Did not proceed further due to increasing nausea and pt did not want to take medication.  Feel should be better at least to allow d/c home tomorrow whether or not BPPV is completely gone due to improved mobility and can be seen on out patient basis if needed.      Follow Up Recommendations  Supervision for mobility/OOB;Outpatient PT           Equipment Recommendations  Rolling walker with 5" wheels       Frequency Min 4X/week   Plan Discharge plan needs to be updated    Precautions / Restrictions Precautions Precautions: Fall   Pertinent Vitals/Pain Min c/o neck/upper back soreness    Mobility  Bed Mobility Bed Mobility: Left Sidelying to Sit;Sit to Supine;Rolling Left Rolling Right: 5: Supervision Rolling Left: 5: Supervision Right Sidelying to Sit: 5: Supervision;HOB flat Left Sidelying to Sit: 5: Supervision;HOB flat Sit to Supine: 4: Min assist;HOB flat Details for Bed Mobility Assistance: long sit to supine with support for head rotation and to slow descent due to spinal sorenss with dix hallpike and rolling and up to sit with Eply's Transfers Sit to Stand: 5: Supervision;From bed Stand to Sit: 5: Supervision;To chair/3-in-1 Stand Pivot Transfers: 4: Min guard Details for Transfer Assistance: after cannalith repositioning, pt denied vertigo when he stood up to move to the chair    Vestibular Exercises Performed dix hall pike to right with positive right rotary nystagmus mim- mod intensity lasting approximately 10-15 seconds.  Performed Eply's  repositioning maneuver for right posterior canal BPPV.  Performed dix hall pike to left positive for upbeating nystagmus lasting about 20 seconds moderate to severe intensity.  Performed Eply's repositioning x 2 for left posterior canal BPPV.  First repitition difficult to note rotary component to nystagmus in hall pike position, but able to see left rotation with second repitition.  Then noted in final position of Eply's with first repitition low intensity downbeating nystagmus lasting about 60 seconds.  Did not see nystagmus in final position of Eply's wtih second repitition.  Mild nausea after both repititions to left.    PT Goals Acute Rehab PT Goals Pt will Roll Supine to Right Side: with modified independence;Other (comment) PT Goal: Rolling Supine to Right Side - Progress: Progressing toward goal Pt will go Supine/Side to Sit: with modified independence;Other (comment) PT Goal: Supine/Side to Sit - Progress: Progressing toward goal Pt will go Sit to Supine/Side: with modified independence;Other (comment) PT Goal: Sit to Supine/Side - Progress: Progressing toward goal Pt will go Sit to Stand: with supervision;Other (comment) PT Goal: Sit to Stand - Progress: Progressing toward goal Additional Goals Additional Goal #1: Pt will verbalize gaze stabilization techniques to use for control of symptoms. PT Goal: Additional Goal #1 - Progress: Progressing toward goal  Visit Information  Last PT Received On: 07/12/12    Subjective Data  Subjective: Feeling much better after getting crystals moved yesterday.   Cognition  Cognition Overall Cognitive Status: Appears within functional limits for tasks assessed/performed Arousal/Alertness: Awake/alert Orientation Level: Appears intact for tasks assessed Behavior During Session: [facility] for tasks performed    Balance  Static Sitting Balance Static Sitting - Balance Support: Feet supported;Bilateral upper extremity supported Static Sitting - Level  of Assistance: 4: Min assist Static Sitting - Comment/# of Minutes: few moments after repositioning upon sitting provided min support for balance due to dizziness, then able to sit unsupported  End of Session PT - End of Session Activity Tolerance: Patient limited by fatigue (and nausea) Patient left: in chair;with call bell/phone within reach;with family/visitor present   GP     [facility] 07/12/2012, 1:54 PM Sheran Lawless, PT [phone] 07/12/2012

## 2012-07-12 NOTE — Progress Notes (Signed)
[facility] Progress Note [place] TEAM 1 - Stepdown/ICU TEAM   Sean Lozano ZOX:[id number] DOB: 31-May-1933 DOA: 07/09/2012 PCP: Sean Barre, MD  Brief narrative: 79 year old male patient with history of coronary artery disease and past PCI's on Plavix and aspirin. He was sent to the hospital after a mechanical fall. He reported standing on a 3 foot tall entertainment center to adjust his screen TV when he lost his balance fell backward and hit his head against a concrete floor. He only remembers waking up in the custody of EMS after the fall.  He had no dizziness or lightheadedness prior to the fall. He endorses this his third fall in the past month. All were mechanical related. In the ER CT scan revealed right tentorial and parafalcine subdural hematoma but no skull fracture nor any subluxation or fracture in the cervical spine. Left shoulder x-ray and thoracic spine were also normal. Neurosurgery was consulted by the emergency room physician who recommended medical admission for observation to the step down unit.  Assessment/Plan:  SDH (subdural hematoma) -NS following -Plavix/ASA on hold (see below)- NS recommends for TOTAL 2 weeks -FU CT head 4/1 demonstrated stability of SDH -clinically stable on exam today - denies HA but endorsed dizziness which began after SDH -PT/OT evaluation for gait stability/dizziness post SDH-PT working with pt WJ:[id number] maneuvers  CAD (coronary artery disease) -pt with history of NSTEMI 9/13 after Plavix and ASA were held 5 days pre procedure;because of this both the patient and his son were reluctant to allow the Medicine or NS doctors to hold these meds before current situation had been reviewed by the pt's cardiologist -Dr. Tresa Endo rapidly evaluated the pt after admission and discussed in great detail the rationale behind holding Plavix and ASA in the current situation explaining the neurosurgical risk of continuing for now outweighs the cardiac  benefit:  "There is a risk of MI and/or instent restenosis while holding dual-antiplatelet therapy as I discussed with Sean Lozano but at this juncture, there is a much higher risk of expansion of his subdural hematoma. His last stent was actually in late 2012 so he is greater than 1 year after stent placement but his current medical plan is indefinite dual-antiplatelet therapy given his MI/instent restenosis while off asa/plavix for 4-5 days. I spoke with Sean Lozano, both sons and his wife about the plans as delineated here and they understand the careful balance of intracranial bleeding and coronary artery disease treatment. We will continue beta-blocker."  -cont Beta blocker  Chronic diastolic congestive heart failure, NYHA class 1/ECHO 2010 -compensated at present  BENIGN PROSTATIC HYPERTROPHY -monitor for urinary retention -cont Flomax -with dizziness check OVS   Type II or unspecified type diabetes mellitus without mention of complication -HgbA1c 6.5 -diet controlled at home/cont SSI here  Leukocytosis -likely due to SDH - follow  DEPRESSION  GERD -PPI  Hyperlipidemia -Pravachol/Niacin  DVT prophylaxis: SCDs only due to ICH Code Status: Full Lozano Communication: Patient Disposition Plan: Telemetry-keep inpt until safe to ambulate Isolation: None  Consultants: Neurosurgery Cardiology  Procedures: None  Antibiotics: None  HPI/Subjective: Patient alert -PT at bedside performing vertigo maneuvers (Epley's).  Denies HA or focal neurologic complaints.  Objective: Blood pressure 106/58, pulse 67, temperature 98 F (36.7 C), temperature source Oral, resp. rate 17, height 5\' 11"  (1.803 m), weight 96.163 kg (212 lb), SpO2 92.00%.  Intake/Output Summary (Last 24 hours) at 07/12/12 1506 Last data filed at 07/12/12 1350  Gross per 24 hour  Intake  600 ml  Output      0 ml  Net    600 ml    Exam: General: No acute respiratory distress Lungs: Clear  to auscultation bilaterally without wheezes or crackles, RA Cardiovascular: Regular rate and rhythm without murmur gallop or rub normal S1 and S2, no peripheral edema or JVD Abdomen: Nontender, nondistended, soft, bowel sounds positive, no rebound, no ascites, no appreciable mass Musculoskeletal: No significant cyanosis, clubbing of bilateral lower extremities Neurological: Alert and oriented x 4, moves all extremities x 4 without focal neurological deficits, CN 2-13 intact  Data Reviewed: Basic Metabolic Panel:  Recent Labs Lab 07/09/12 1430 07/10/12 0500 07/11/12 0430  NA 135 139 139  K 3.9 3.9 3.9  CL 99 105 104  CO2 25 25 25   GLUCOSE 167* 120* 125*  BUN 13 14 14   CREATININE 1.11 1.09 1.03  CALCIUM 9.1 8.8 8.8   CBC:  Recent Labs Lab 07/09/12 1430 07/10/12 0500 07/11/12 0430  WBC 13.0* 13.3* 12.2*  NEUTROABS 9.9*  --   --   HGB 15.7 15.0 14.6  HCT 43.9 43.3 42.7  MCV 94.6 95.4 96.6  PLT 151 161 152   CBG:  Recent Labs Lab 07/11/12 1148 07/11/12 1751 07/11/12 2041 07/12/12 0729 07/12/12 1122  GLUCAP 146* 167* 151* 110* 115*    Recent Results (from the past 240 hour(s))  MRSA PCR SCREENING     Status: None   Collection Time    07/09/12 11:24 PM      Result Value Range Status   MRSA by PCR NEGATIVE  NEGATIVE Final   Comment:            The GeneXpert MRSA Assay (FDA     approved for NASAL specimens     only), is one component of a     comprehensive MRSA colonization     surveillance program. It is not     intended to diagnose MRSA     infection nor to guide or     monitor treatment for     MRSA infections.     Studies:  Recent x-ray studies have been reviewed in detail by the Attending Physician  Scheduled Meds:  Reviewed in detail by the Attending Physician   Junious Silk, ANP [facility] Office  [phone] Pager [phone]  On-Call/Text Page:      [email or web address]      password [email or web address]  If 7PM-7AM, please contact  night-coverage [email or web address] Password [email or web address] 07/12/2012, 3:06 PM   LOS: 3 days   I have examined the patient, reviewed the chart and modified the above note which I agree with.   Clarivel Callaway,MD [phone] 07/25/2012, 9:07 PM

## 2012-07-13 DIAGNOSIS — R079 Chest pain, unspecified: Secondary | ICD-10-CM | POA: Diagnosis not present

## 2012-07-13 LAB — TROPONIN I
Troponin I: <0.3 ng/mL (ref <0.30)
Troponin I: <0.3 ng/mL (ref <0.30)

## 2012-07-13 LAB — GLUCOSE, CAPILLARY
Glucose-Capillary: 120 mg/dL — ABNORMAL HIGH (ref 70–99)
Glucose-Capillary: 178 mg/dL — ABNORMAL HIGH (ref 70–99)
Glucose-Capillary: 140 mg/dL — ABNORMAL HIGH (ref 70–99)
Glucose-Capillary: 123 mg/dL — ABNORMAL HIGH (ref 70–99)

## 2012-07-13 MED ORDER — ASPIRIN 81 MG PO CHEW
81.0000 mg | CHEWABLE_TABLET | Freq: Once | ORAL | Status: AC
  Administered 2012-07-13: 81 mg via ORAL
  Filled 2012-07-13: qty 1

## 2012-07-13 MED ORDER — CLOPIDOGREL BISULFATE 75 MG PO TABS
75.0000 mg | ORAL_TABLET | Freq: Every day | ORAL | Status: DC
  Administered 2012-07-14 – 2012-07-15 (×2): 75 mg via ORAL
  Filled 2012-07-13 (×2): qty 1

## 2012-07-13 NOTE — Progress Notes (Signed)
Occupational Therapy Treatment Patient Details Name: Sean Lozano MRN: [id number] DOB: 1933-10-13 Today's Date: 07/13/2012 Time: 2951-8841 OT Time Calculation (min): 44 min  OT Assessment / Plan / Recommendation Comments on Treatment Session Patient tolerated session well. Dizziness when patient bent over to doff sock, so patient educated in different technique of crossing leg over knee to reach sock. Patient continues to benefit from skilled OT intervention.    Precautions / Restrictions Precautions Precautions: Fall Restrictions Weight Bearing Restrictions: No   Pertinent Vitals/Pain No c/o pain    ADL  Grooming: Performed;Teeth care;Wash/dry hands;Supervision/safety Where Assessed - Grooming: Unsupported standing Lower Body Dressing: Performed;Supervision/safety Where Assessed - Lower Body Dressing: Unsupported sit to stand ADL Comments: Reviewed gaze stabilization for mobility during ADLs. Patient also educated on bringing legs to him to don/doff socks and start pants in sitting to prevent dizziness during dressing tasks.       OT Goals ADL Goals Pt Will Perform Lower Body Dressing: with supervision ADL Goal: Lower Body Dressing - Progress: Progressing toward goals Pt Will Transfer to Toilet: with supervision;Ambulation;3-in-1 ADL Goal: Toilet Transfer - Progress: Progressing toward goals  Visit Information  Last OT Received On: 07/13/12 Assistance Needed: +1    Cognition  Cognition Overall Cognitive Status: Appears within functional limits for tasks assessed/performed Arousal/Alertness: Awake/alert Orientation Level: Appears intact for tasks assessed Behavior During Session: [facility] for tasks performed    Mobility  Transfers Transfers: Sit to Stand;Stand to Sit Sit to Stand: 5: Supervision Stand to Sit: 5: Supervision       Balance Balance Balance Assessed: No   End of Session OT - End of Session Activity Tolerance: Patient tolerated treatment well Patient  left: in chair;with call bell/phone within reach  GO    Gerre Ranum, OT/L  07/13/2012, 11:05 AM

## 2012-07-13 NOTE — Progress Notes (Addendum)
[facility] Progress Note [place] TEAM 1 - Stepdown/ICU TEAM   Sean Lozano ZOX:[id number] DOB: [birth year] DOA: 07/09/2012 PCP: Sean Barre, MD  Brief narrative: 79 year old male patient with history of coronary artery disease and past PCI's on Plavix and aspirin. He was sent to the hospital after a mechanical fall. He reported standing on a 3 foot tall entertainment center to adjust his screen TV when he lost his balance fell backward and hit his head against a concrete floor. He only remembers waking up in the custody of EMS after the fall.  He had no dizziness or lightheadedness prior to the fall. He endorses this his third fall in the past month. All were mechanical related. In the ER CT scan revealed right tentorial and parafalcine subdural hematoma but no skull fracture nor any subluxation or fracture in the cervical spine. Left shoulder x-ray and thoracic spine were also normal. Neurosurgery was consulted by the emergency room physician who recommended medical admission for observation to the step down unit.  Assessment/Plan:  SDH (subdural hematoma) -NS following -Plavix/ASA on hold (see below)- NS recommends for TOTAL 2 weeks -Unfortunately due to typical CP that resolved with SL NTG Dr. Myrtis Ser has carefully reviewed risk/benefit profile and has determined we need to resume Plavix & ASA and monitor INPATIENT closely with consideration of CT head later to document SDH stability- please see his note from 4/3. -FU CT head 4/1 demonstrated stability of SDH -clinically stable on exam today - denies HA but endorsed dizziness which began after SDH -PT/OT evaluation for gait stability/dizziness post SDH-PT working with pt WJ:[id number] maneuvers  CAD (coronary artery disease) -pt with history of NSTEMI 9/13 after Plavix and ASA were held 5 days pre procedure;because of this both the patient and his son were reluctant to allow the Medicine or NS doctors to hold these meds before current  situation had been reviewed by the pt's cardiologist -Dr. Tresa Endo rapidly evaluated the pt after admission and discussed in great detail the rationale behind holding Plavix and ASA in the current situation explaining the neurosurgical risk of continuing for now outweighs the cardiac benefit:  "There is a risk of MI and/or instent restenosis while holding dual-antiplatelet therapy as I discussed with Mr. Saindon and his family but at this juncture, there is a much higher risk of expansion of his subdural hematoma. His last stent was actually in late 2012 so he is greater than 1 year after stent placement but his current medical plan is indefinite dual-antiplatelet therapy given his MI/instent restenosis while off asa/plavix for 4-5 days. I spoke with Mr. Mendell, both sons and his wife about the plans as delineated here and they understand the careful balance of intracranial bleeding and coronary artery disease treatment. We will continue beta-blocker."  -cont Beta blocker -see above regarding resumption of Plavix and ASA -Currently no CP verbalized  Chronic diastolic congestive heart failure, NYHA class 1/ECHO 2010 -compensated at present  BENIGN PROSTATIC HYPERTROPHY -monitor for urinary retention -cont Flomax -with dizziness check OVS   Type II or unspecified type diabetes mellitus without mention of complication -HgbA1c 6.5 -diet controlled at home/cont SSI here  Leukocytosis -likely due to SDH - follow  DEPRESSION  GERD -PPI  Hyperlipidemia -Pravachol/Niacin  DVT prophylaxis: SCDs only due to SDH Code Status: Full Family Communication: Patient Disposition Plan: Telemetry Isolation: None  Consultants: Neurosurgery Cardiology  Procedures: None  Antibiotics: None  HPI/Subjective: Patient alert -no CP.NO dizziness or SOB, no weakness  Objective: Blood pressure 102/67,  pulse 77, temperature 98 F (36.7 C), temperature source Oral, resp. rate 20, height 5\' 11"  (1.803 m),  weight 96.163 kg (212 lb), SpO2 93.00%.  Intake/Output Summary (Last 24 hours) at 07/13/12 1534 Last data filed at 07/13/12 0800  Gross per 24 hour  Intake    640 ml  Output      0 ml  Net    640 ml    Exam: General: No acute respiratory distress Lungs: Clear to auscultation bilaterally without wheezes or crackles, RA Cardiovascular: Regular rate and rhythm without murmur gallop or rub normal S1 and S2, no peripheral edema or JVD Abdomen: Nontender, nondistended, soft, bowel sounds positive, no rebound, no ascites, no appreciable mass Musculoskeletal: No significant cyanosis, clubbing of bilateral lower extremities Neurological: Alert and oriented x 4, moves all extremities x 4 without focal neurological deficits, CN 2-13 intact  Data Reviewed: Basic Metabolic Panel:  Recent Labs Lab 07/09/12 1430 07/10/12 0500 07/11/12 0430  NA 135 139 139  K 3.9 3.9 3.9  CL 99 105 104  CO2 25 25 25   GLUCOSE 167* 120* 125*  BUN 13 14 14   CREATININE 1.11 1.09 1.03  CALCIUM 9.1 8.8 8.8   CBC:  Recent Labs Lab 07/09/12 1430 07/10/12 0500 07/11/12 0430  WBC 13.0* 13.3* 12.2*  NEUTROABS 9.9*  --   --   HGB 15.7 15.0 14.6  HCT 43.9 43.3 42.7  MCV 94.6 95.4 96.6  PLT 151 161 152   CBG:  Recent Labs Lab 07/12/12 1122 07/12/12 1613 07/12/12 2101 07/13/12 0722 07/13/12 1130  GLUCAP 115* 133* 132* 120* 178*    Recent Results (from the past 240 hour(s))  MRSA PCR SCREENING     Status: None   Collection Time    07/09/12 11:24 PM      Result Value Range Status   MRSA by PCR NEGATIVE  NEGATIVE Final   Comment:            The GeneXpert MRSA Assay (FDA     approved for NASAL specimens     only), is one component of a     comprehensive MRSA colonization     surveillance program. It is not     intended to diagnose MRSA     infection nor to guide or     monitor treatment for     MRSA infections.     Studies:  Recent x-ray studies have been reviewed in detail by the  Attending Physician  Scheduled Meds:  Reviewed in detail by the Attending Physician   Junious Silk, ANP [facility] Office  [phone] Pager [phone]  On-Call/Text Page:      [email or web address]      password [email or web address]  If 7PM-7AM, please contact night-coverage [email or web address] Password [facility] 07/13/2012, 3:34 PM   LOS: 4 days    I have examined the patient, reviewed the chart and modified the above note which I agree with.   RIZWAN,SAIMA,MD [phone] 07/13/2012, 6:23 PM

## 2012-07-13 NOTE — Progress Notes (Addendum)
Patient ID: Sean Lozano, male   DOB: 13-Feb-1934, 79 y.o.   MRN: [id number]   SUBJECTIVE:  The patient had chest discomfort last night. He has not been having chest discomfort at home before this admission. We have been very concerned about holding his aspirin and Plavix. His pain last night was only mild. He says it was relieved with one nitroglycerin. I'm very concerned about this. Everyone has been working together carefully concerning the use of aspirin Plavix and this patient has had a subdural with a fall. I feel we may have to restart his aspirin and Plavix. Please see my discussion below. I will try to communicate with the patient's main doctors this morning.  His EKG did not change. Troponins were drawn and showed no significant change.   Filed Vitals:   07/12/12 2000 07/12/12 2058 07/13/12 0029 07/13/12 0400  BP: 144/88 111/70 109/67 113/72  Pulse: 66 70 69 61  Temp:  98.7 F (37.1 C) 98 F (36.7 C) 98 F (36.7 C)  TempSrc:  Oral Oral Oral  Resp: 18 17 18 20   Height:      Weight:      SpO2: 96% 95% 96% 96%    Intake/Output Summary (Last 24 hours) at 07/13/12 0752 Last data filed at 07/12/12 1713  Gross per 24 hour  Intake    840 ml  Output      0 ml  Net    840 ml    LABS: Basic Metabolic Panel:  Recent Labs  08/65/78 0430  NA 139  K 3.9  CL 104  CO2 25  GLUCOSE 125*  BUN 14  CREATININE 1.03  CALCIUM 8.8   Liver Function Tests: No results found for this basename: AST, ALT, ALKPHOS, BILITOT, PROT, ALBUMIN,  in the last 72 hours No results found for this basename: LIPASE, AMYLASE,  in the last 72 hours CBC:  Recent Labs  07/11/12 0430  WBC 12.2*  HGB 14.6  HCT 42.7  MCV 96.6  PLT 152   Cardiac Enzymes:  Recent Labs  07/12/12 1950 07/13/12 0207  TROPONINI <0.30 <0.30   BNP: No components found with this basename: POCBNP,  D-Dimer: No results found for this basename: DDIMER,  in the last 72 hours Hemoglobin A1C: No results found for this  basename: HGBA1C,  in the last 72 hours Fasting Lipid Panel: No results found for this basename: CHOL, HDL, LDLCALC, TRIG, CHOLHDL, LDLDIRECT,  in the last 72 hours Thyroid Function Tests: No results found for this basename: TSH, T4TOTAL, FREET3, T3FREE, THYROIDAB,  in the last 72 hours  RADIOLOGY: Dg Thoracic Spine 2 View  07/09/2012  *RADIOLOGY REPORT*  Clinical Data: Back pain status post fall today.  THORACIC SPINE - 2 VIEW  Comparison: Chest radiographs 12/20/2011.  Findings: Patient is status post multilevel cervical fusion.  The thoracic alignment is stable with a mild convex right scoliosis. There is no evidence of acute fracture or paraspinal abnormality. Multiple paraspinal osteophytes are noted.  Patient is status post CABG.  Coronary artery stents are also noted.  IMPRESSION: No evidence of acute thoracic spine injury.   Original Report Authenticated By: Carey Bullocks, M.D.    Ct Head Wo Contrast  07/11/2012  *RADIOLOGY REPORT*  Clinical Data: Subdural hemorrhage  CT HEAD WITHOUT CONTRAST  Technique:  Contiguous axial images were obtained from the base of the skull through the vertex without contrast.  Comparison: 07/09/2012  Findings: 7.4 mm interhemispheric subdural hematoma on the right is unchanged.  This extends along the right tentorium, unchanged.  No new area of hemorrhage.  Mild generalized atrophy and mild chronic microvascular ischemia. No acute infarct.  No mass lesion.  No midline shift.  IMPRESSION: Stable subdural hemorrhage.  Negative for acute infarct or new hemorrhage.  Negative for hydrocephalus.   Original Report Authenticated By: Janeece Riggers, M.D.    Ct Head Wo Contrast  07/09/2012  *RADIOLOGY REPORT*  Clinical Data:  Altered mental status after falling today.  CT HEAD WITHOUT CONTRAST CT CERVICAL SPINE WITHOUT CONTRAST  Technique:  Multidetector CT imaging of the head and cervical spine was performed following the standard protocol without intravenous contrast.   Multiplanar CT image reconstructions of the cervical spine were also generated.  Comparison:  05/23/2007.  CT HEAD  Findings: Right tentorial and parafalcine subdural hematoma, measuring 8 mm in maximum thickness on image number 21.  No associated mass effect.  Enlarged ventricles and subarachnoid spaces.  Patchy white matter low density in both cerebral hemispheres.  No skull fractures or paranasal sinus air-fluid levels.  Minimal left sphenoid sinus mucosal thickening.  IMPRESSION:  1.  Right tentorial and parafalcine subdural hematoma, as described above. 2.  Minimally progressive atrophy and chronic small vessel white matter ischemic changes. 3.  Minimal chronic sphenoid sinusitis.  CT CERVICAL SPINE  Findings: Interbody bone plug and anterior screw and plate fixation at the C4-C6 levels with normal alignment.  Solid interbody bone plug anterior fusion at the C3-4 level.  Anterior and posterior bony bridging at the C2-3 level.  Anterior and posterior spur formation at the C6-7 level and anterior spurs at the C7-T1 and T1- T2 levels.  Facet degenerative changes at multiple levels.  No prevertebral soft tissue swelling, fractures or subluxations. Bilateral carotid artery atheromatous calcifications.  IMPRESSION:  1.  No fracture or subluxation. 2.  Postoperative and degenerative changes, as described above. 3.  Bilateral carotid artery atheromatous calcifications.  Critical Value/emergent results were called by telephone at the time of interpretation on 07/09/2012 at 1514 hours to Dr. Laveda Norman, who verbally acknowledged these results.   Original Report Authenticated By: Beckie Salts, M.D.    Ct Cervical Spine Wo Contrast  07/09/2012  *RADIOLOGY REPORT*  Clinical Data:  Altered mental status after falling today.  CT HEAD WITHOUT CONTRAST CT CERVICAL SPINE WITHOUT CONTRAST  Technique:  Multidetector CT imaging of the head and cervical spine was performed following the standard protocol without intravenous contrast.   Multiplanar CT image reconstructions of the cervical spine were also generated.  Comparison:  05/23/2007.  CT HEAD  Findings: Right tentorial and parafalcine subdural hematoma, measuring 8 mm in maximum thickness on image number 21.  No associated mass effect.  Enlarged ventricles and subarachnoid spaces.  Patchy white matter low density in both cerebral hemispheres.  No skull fractures or paranasal sinus air-fluid levels.  Minimal left sphenoid sinus mucosal thickening.  IMPRESSION:  1.  Right tentorial and parafalcine subdural hematoma, as described above. 2.  Minimally progressive atrophy and chronic small vessel white matter ischemic changes. 3.  Minimal chronic sphenoid sinusitis.  CT CERVICAL SPINE  Findings: Interbody bone plug and anterior screw and plate fixation at the C4-C6 levels with normal alignment.  Solid interbody bone plug anterior fusion at the C3-4 level.  Anterior and posterior bony bridging at the C2-3 level.  Anterior and posterior spur formation at the C6-7 level and anterior spurs at the C7-T1 and T1- T2 levels.  Facet degenerative changes at multiple levels.  No prevertebral soft  tissue swelling, fractures or subluxations. Bilateral carotid artery atheromatous calcifications.  IMPRESSION:  1.  No fracture or subluxation. 2.  Postoperative and degenerative changes, as described above. 3.  Bilateral carotid artery atheromatous calcifications.  Critical Value/emergent results were called by telephone at the time of interpretation on 07/09/2012 at 1514 hours to Dr. Laveda Norman, who verbally acknowledged these results.   Original Report Authenticated By: Beckie Salts, M.D.    Dg Shoulder Left  07/09/2012  *RADIOLOGY REPORT*  Clinical Data: Shoulder pain.  Fall.  LEFT SHOULDER - 2+ VIEW  Comparison: Cervical spine CT 07/09/2012  Findings: The mineralization and alignment are normal.  There is no evidence of acute fracture or dislocation.  The subacromial space is preserved.  There are moderate  glenohumeral degenerative changes.  Patient is status post lower cervical fusion.  The left C6 screw is absent.  IMPRESSION: No acute osseous findings.  Moderate glenohumeral degenerative changes.   Original Report Authenticated By: Carey Bullocks, M.D.     PHYSICAL EXAM  Patient is oriented to person time and place. Affect is normal. There is no jugulovenous distention. Lungs are clear. Respiratory effort is nonlabored. Cardiac exam her vitals S1 and S2. There no clicks or significant murmurs. The abdomen is soft. Is no peripheral edema.   TELEMETRY:  I have reviewed telemetry today July 13, 2012. There is normal sinus rhythm.   ASSESSMENT AND PLAN:     CAD (coronary artery disease)     I am very concerned about the patient's return of chest discomfort. Clearly he is anxious about this and this is making the situation worse. I am very aware of the concerns concerning the possible resumption of aspirin and Plavix. However, he if he hasn't recurred acute event in the hospital the situation may be even worse. Also it seems unlikely that the addition of heparin would be wise at this point. I wonder if 1 approach could be considered as follows:  Resume aspirin Plavix today. Continue to watch the patient in the hospital for possible return of chest pain. Continue to watch the patient in the hospital with aggressive followup CT scanning to be sure that there is no change in the subdural findings with the resumption of his aspirin and Plavix. This is a difficult call but may be a prudent way to proceed.       SDH (subdural hematoma)     Fortunately the patient is remaining stable. This subdural occurred when the patient fell while on aspirin and Plavix. His brain lesions stabilize while he was still on aspirin and Plavix as it was decreasing in efficacy over the past several days since the event occurred on March 31. Possibly this means he would be stable back on the medicine. This will be discussed  carefully with others.       Willa Rough 07/13/2012 7:52 AM  Cell  [phone]  8:18 AM  UPDATE:   I have now spoken with Junious Silk, NP who is part of the patient's primary care team. I have explained my thoughts. She and her team will look further into this possible approach.  I also called and spoken again with the patient to let him know that there would be further discussion about this issue as the day goes on. He understands. I think this approach of restarting aspirin Plavix today is the plan that he would like to follow. He does understand all the risks.  Jerral Bonito, MD

## 2012-07-13 NOTE — Care Management Note (Unsigned)
    Page 1 of 1   07/13/2012     2:09:54 PM   CARE MANAGEMENT NOTE 07/13/2012  Patient:  Sean Lozano, Sean Lozano   Account Number:  [id number]  Date Initiated:  07/13/2012  Documentation initiated by:  GRAVES-BIGELOW,Jennfer Gassen  Subjective/Objective Assessment:   Pt admitted with Mechanical fall resulting in subdural hematoma.     Action/Plan:   CM will continue to monitor for disposition needs. Per PT/OTrecommendations plan for outpatient PT/OT services.   Anticipated DC Date:  07/14/2012   Anticipated DC Plan:  HOME/SELF CARE      DC Planning Services  CM consult      Choice offered to / List presented to:             Status of service:  In process, will continue to follow Medicare Important Message given?   (If response is "NO", the following Medicare IM given date fields will be blank) Date Medicare IM given:   Date Additional Medicare IM given:    Discharge Disposition:    Per UR Regulation:  Reviewed for med. necessity/level of care/duration of stay  If discussed at Long Length of Stay Meetings, dates discussed:    Comments:

## 2012-07-13 NOTE — Progress Notes (Signed)
Physical Therapy Treatment Patient Details Name: Sean Lozano MRN: [id number] DOB: Jan 25, 1934 Today's Date: 07/13/2012 Time: 5366-4403 PT Time Calculation (min): 57 min  PT Assessment / Plan / Recommendation Comments on Treatment Session  Patient with central signs during vestibular assessment.  Feel SDH is contributing to signs we see during positional testing and cannot rule out that this is main cause.  Will hold further BPPV treatment and focus on mobility, safety, compensation and leave further testing to outpatient PT.      Follow Up Recommendations  Outpatient PT;Supervision for mobility/OOB           Equipment Recommendations  Rolling walker with 5" wheels       Frequency Min 4X/week   Plan Discharge plan remains appropriate    Precautions / Restrictions Precautions Precautions: Fall Restrictions Weight Bearing Restrictions: No   Pertinent Vitals/Pain No c/o    Mobility  Bed Mobility Rolling Right: 4: Min guard Right Sidelying to Sit: 4: Min guard Sit to Supine: 4: Min assist Details for Bed Mobility Assistance: during mobility and canalith repositioning cues for visual compensation Transfers Transfers: Sit to Stand;Stand to Sit;Stand Pivot Transfers Sit to Stand: 5: Supervision Stand to Sit: 5: Supervision Stand Pivot Transfers: 5: Supervision Ambulation/Gait Ambulation/Gait Assistance: 4: Min guard;5: Supervision Ambulation Distance (Feet): 400 Feet Assistive device: Rolling walker Ambulation/Gait Assistance Details: cues for visual compensation and encouraged to walk more with nursing staff today Gait Pattern: Step-through pattern;Trunk flexed;Decreased stride length    Vestibular Assessment    Patient with ageotropic nystagmus with supine head roll to both sides, performed left dix hallpike and brief but intense upgoing nystagmus noted.  Performed canalith repositioning x 1 for left BPPV.  Seated vestibular assessment; smooth pursuits with direction  changing gaze holding nystagmus to left and right; saccades with overshoots, VOR intact and only mild symptoms provoked vertically (not horizontally,) VOR cancellation with nystagmus to both sides.  Coordination testing intact UE and LE.   Education: Educated pt and wife feel SDH is contributing to nystagmus and cannot rule in or out BPPV, but obvious central signs noted.  Feel as resolves may be able to tell more specifically which canal(s) may be involved.  Discussed plan to hold on further BPPV treatments until has outpatient follow up and best to focus on balance, compensation and mobility for remainder of hospitalization.     PT Goals Acute Rehab PT Goals Pt will Roll Supine to Right Side: with modified independence;Other (comment) PT Goal: Rolling Supine to Right Side - Progress: Progressing toward goal Pt will go Supine/Side to Sit: with modified independence;Other (comment) PT Goal: Supine/Side to Sit - Progress: Progressing toward goal Pt will go Sit to Stand: with supervision;Other (comment) PT Goal: Sit to Stand - Progress: Progressing toward goal Pt will Ambulate: >150 feet;with supervision;with least restrictive assistive device;Other (comment) PT Goal: Ambulate - Progress: Progressing toward goal Additional Goals Additional Goal #1: Pt will verbalize gaze stabilization techniques to use for control of symptoms. PT Goal: Additional Goal #1 - Progress: Progressing toward goal  Visit Information  Last PT Received On: 07/13/12 Assistance Needed: +1    Subjective Data  Subjective: Had chest pressure last night.  Think my cardiologist is going to restart meds.   Cognition  Cognition Overall Cognitive Status: Appears within functional limits for tasks assessed/performed Arousal/Alertness: Awake/alert Orientation Level: Appears intact for tasks assessed Behavior During Session: [facility] for tasks performed    Balance  Balance Balance Assessed: No Static Sitting Balance Static  Sitting -  Balance Support: Feet supported;Bilateral upper extremity supported Static Sitting - Level of Assistance: 6: Modified independent (Device/Increase time) Static Standing Balance Static Standing - Balance Support: No upper extremity supported Static Standing - Level of Assistance: 5: Stand by assistance Static Standing - Comment/# of Minutes: standing momentarily before getting to chair, bent to allow him to hold chair   End of Session PT - End of Session Equipment Utilized During Treatment: Gait belt Activity Tolerance: Patient tolerated treatment well Patient left: in chair;with call bell/phone within reach;with family/visitor present   GP     [facility] 07/13/2012, 1:52 PM Sheran Lawless, PT [phone] 07/13/2012

## 2012-07-14 DIAGNOSIS — E119 Type 2 diabetes mellitus without complications: Secondary | ICD-10-CM

## 2012-07-14 LAB — GLUCOSE, CAPILLARY
Glucose-Capillary: 126 mg/dL — ABNORMAL HIGH (ref 70–99)
Glucose-Capillary: 120 mg/dL — ABNORMAL HIGH (ref 70–99)
Glucose-Capillary: 121 mg/dL — ABNORMAL HIGH (ref 70–99)
Glucose-Capillary: 136 mg/dL — ABNORMAL HIGH (ref 70–99)

## 2012-07-14 MED ORDER — ASPIRIN 81 MG PO TBEC: 81.0000 mg | DELAYED_RELEASE_TABLET | Freq: Every day | ORAL | Status: DC

## 2012-07-14 MED ORDER — MENTHOL 3 MG MT LOZG
1.0000 | LOZENGE | OROMUCOSAL | Status: DC | PRN
  Filled 2012-07-14: qty 9

## 2012-07-14 MED ORDER — ASPIRIN EC 81 MG PO TBEC
81.0000 mg | DELAYED_RELEASE_TABLET | Freq: Every day | ORAL | Status: DC
  Administered 2012-07-14: 81 mg via ORAL
  Filled 2012-07-14 (×2): qty 1

## 2012-07-14 NOTE — Progress Notes (Signed)
[facility] Progress Note Sean TEAM 1 - Stepdown/ICU TEAM   MANU Lozano ZOX:[id number] DOB: 06/21/1933 DOA: 07/09/2012 PCP: Oliver Barre, MD  Brief narrative: 80 year old male patient with history of coronary artery disease and past PCI's on Plavix and aspirin. He was sent to the hospital after a mechanical fall. He reported standing on a 3 foot tall entertainment center to adjust his screen TV when he lost his balance fell backward and hit his head against a concrete floor. He only remembers waking up in the custody of EMS after the fall.  He had no dizziness or lightheadedness prior to the fall. He endorses this his third fall in the past month. All were mechanical related. In the ER CT scan revealed right tentorial and parafalcine subdural hematoma but no skull fracture nor any subluxation or fracture in the cervical spine. Left shoulder x-ray and thoracic spine were also normal. Neurosurgery was consulted by the emergency room physician who recommended medical admission for observation to the step down unit.  Assessment/Plan:  SDH (subdural hematoma) -NS has seen  -Plavix/ASA initially held (see below) - NS recommended for TOTAL 2 weeks -due to typical CP that resolved with SL NTG Dr. Myrtis Ser has carefully reviewed risk/benefit profile and has determined we need to resume Plavix & ASA and monitor closely with consideration of CT head to document SDH stability- please see his note from 4/3 -FU CT head 4/1 demonstrated stability of SDH -clinically stable on exam 4/4 - denies HA -will repeat CT Head 4/5 -PT/OT evaluation for gait stability/dizziness post SDH -PT working with pt WJ:[id number] maneuvers  CAD (coronary artery disease) -pt with history of NSTEMI 9/13 after Plavix and ASA were held 5 days pre procedure; because of this both the patient and his son were reluctant to allow the Medicine or NS doctors to hold these meds before current situation had been reviewed by the pt's  cardiologist -Dr. Tresa Endo rapidly evaluated the pt after admission and discussed in great detail the rationale behind holding Plavix and ASA in the current situation explaining the neurosurgical risk of continuing initially outweighed the cardiac benefit: -cont Beta blocker -see above regarding recent resumption of Plavix and ASA -Currently no CP verbalized  Chronic diastolic congestive heart failure, NYHA class 1/ECHO 2010 -compensated at present  BENIGN PROSTATIC HYPERTROPHY -monitor for urinary retention -cont Flomax   Type II or unspecified type diabetes mellitus without mention of complication -HgbA1c 6.5 -diet controlled at home/cont SSI here  Leukocytosis -likely due to SDH - follow  DEPRESSION  GERD -PPI  Hyperlipidemia -Pravachol/Niacin  DVT prophylaxis: SCDs only due to SDH Code Status: Full Family Communication: Patient Disposition Plan: Telemetry Isolation: None  Consultants: Neurosurgery Cardiology  Procedures: None  Antibiotics: None  HPI/Subjective: Patient alert -no further CP.  Minimal positional dizziness. No SOB, no weakness.  Objective: Blood pressure 120/74, pulse 66, temperature 97.9 F (36.6 C), temperature source Oral, resp. rate 18, height 5\' 11"  (1.803 m), weight 96.163 kg (212 lb), SpO2 94.00%.  Intake/Output Summary (Last 24 hours) at 07/14/12 1131 Last data filed at 07/13/12 1700  Gross per 24 hour  Intake    760 ml  Output      0 ml  Net    760 ml    Exam: General: No acute respiratory distress Lungs: Clear to auscultation bilaterally without wheezes or crackles, RA Cardiovascular: Regular rate and rhythm without murmur gallop or rub normal S1 and S2, no peripheral edema or JVD Abdomen: Nontender, nondistended, soft, bowel  sounds positive, no rebound, no ascites, no appreciable mass Musculoskeletal: No significant cyanosis, clubbing of bilateral lower extremities Neurological: Alert and oriented x 4, moves all extremities x  4 without focal neurological deficits, CN II-XII intact  Data Reviewed: Basic Metabolic Panel:  Recent Labs Lab 07/09/12 1430 07/10/12 0500 07/11/12 0430  NA 135 139 139  K 3.9 3.9 3.9  CL 99 105 104  CO2 25 25 25   GLUCOSE 167* 120* 125*  BUN 13 14 14   CREATININE 1.11 1.09 1.03  CALCIUM 9.1 8.8 8.8   CBC:  Recent Labs Lab 07/09/12 1430 07/10/12 0500 07/11/12 0430  WBC 13.0* 13.3* 12.2*  NEUTROABS 9.9*  --   --   HGB 15.7 15.0 14.6  HCT 43.9 43.3 42.7  MCV 94.6 95.4 96.6  PLT 151 161 152   CBG:  Recent Labs Lab 07/13/12 0722 07/13/12 1130 07/13/12 1630 07/13/12 2126 07/14/12 0753  GLUCAP 120* 178* 140* 123* 126*    Recent Results (from the past 240 hour(s))  MRSA PCR SCREENING     Status: None   Collection Time    07/09/12 11:24 PM      Result Value Range Status   MRSA by PCR NEGATIVE  NEGATIVE Final   Comment:            The GeneXpert MRSA Assay (FDA     approved for NASAL specimens     only), is one component of a     comprehensive MRSA colonization     surveillance program. It is not     intended to diagnose MRSA     infection nor to guide or     monitor treatment for     MRSA infections.     Studies:  Recent x-ray studies have been reviewed in detail by the Attending Physician  Scheduled Meds:  Reviewed in detail by the Attending Physician   Junious Silk, ANP [facility] Office  [phone] Pager [phone]  On-Call/Text Page:      [email or web address]      password [email or web address]  If 7PM-7AM, please contact night-coverage [email or web address] Password [email or web address] 07/14/2012, 11:31 AM   LOS: 5 days    I have personally examined this patient and reviewed the entire database. I have reviewed the above note, made any necessary editorial changes, and agree with its content.  Lonia Blood, MD [facility]

## 2012-07-14 NOTE — Progress Notes (Signed)
Patient Name: Sean Lozano Date of Encounter: 07/14/2012  Active Problems:   DEPRESSION   GERD   BENIGN PROSTATIC HYPERTROPHY   Hyperlipidemia   Type II or unspecified type diabetes mellitus without mention of complication, uncontrolled   CAD (coronary artery disease)   SDH (subdural hematoma)   Leukocytosis   Chronic diastolic congestive heart failure, NYHA class 1/ECHO 2010   Chest pain    SUBJECTIVE: NO more chest pain, dizziness is remarkably improved. Wants to do more therapy as an outpatient.   OBJECTIVE Filed Vitals:   07/13/12 2020 07/13/12 2137 07/14/12 0000 07/14/12 0400  BP: 124/74  98/53 113/75  Pulse: 71  63 72  Temp: 98.2 F (36.8 C)  98.1 F (36.7 C) 97.9 F (36.6 C)  TempSrc: Oral  Oral Oral  Resp: 18     Height:      Weight:      SpO2: 97% 91% 92% 97%    Intake/Output Summary (Last 24 hours) at 07/14/12 0701 Last data filed at 07/13/12 1700  Gross per 24 hour  Intake   1160 ml  Output      0 ml  Net   1160 ml   Filed Weights   07/09/12 1900 07/09/12 1919 07/12/12 0400  Weight: 212 lb 15.4 oz (96.6 kg) 212 lb 15.4 oz (96.6 kg) 212 lb (96.163 kg)    PHYSICAL EXAM General: Well developed, well nourished, male in no acute distress. Head: Normocephalic, atraumatic.  Neck: Supple without bruits, JVD not elevated. Lungs:  Resp regular and unlabored, CTA. Heart: RRR, S1, S2, no S3, S4, or murmur; no rub. Abdomen: Soft, non-tender, non-distended, BS + x 4.  Extremities: No clubbing, cyanosis, no edema.  Neuro: Alert and oriented X 3. Moves all extremities spontaneously. Psych: Normal affect.  LABS: CBC:No results found for this basename: WBC, NEUTROABS, HGB, HCT, MCV, PLT,  in the last 72 hours INR:No results found for this basename: INR,  in the last 72 hours Basic Metabolic Panel:No results found for this basename: NA, K, CL, CO2, GLUCOSE, BUN, CREATININE, CALCIUM, MG, PHOS,  in the last 72 hours Liver Function Tests:No results found for  this basename: AST, ALT, ALKPHOS, BILITOT, PROT, ALBUMIN,  in the last 72 hours Cardiac Enzymes: Recent Labs  07/12/12 1950 07/13/12 0207 07/13/12 0749  TROPONINI <0.30 <0.30 <0.30    TELE:   SR, 1 st degree AV block   Radiology/Studies: Ct Head Wo Contrast 07/11/2012  *RADIOLOGY REPORT*  Clinical Data: Subdural hemorrhage  CT HEAD WITHOUT CONTRAST  Technique:  Contiguous axial images were obtained from the base of the skull through the vertex without contrast.  Comparison: 07/09/2012  Findings: 7.4 mm interhemispheric subdural hematoma on the right is unchanged.  This extends along the right tentorium, unchanged.  No new area of hemorrhage.  Mild generalized atrophy and mild chronic microvascular ischemia. No acute infarct.  No mass lesion.  No midline shift.  IMPRESSION: Stable subdural hemorrhage.  Negative for acute infarct or new hemorrhage.  Negative for hydrocephalus.   Original Report Authenticated By: Janeece Riggers, M.D.     Current Medications:  . cholecalciferol  1,000 Units Oral Daily  . clopidogrel  75 mg Oral Q breakfast  . insulin aspart  0-5 Units Subcutaneous QHS  . insulin aspart  0-9 Units Subcutaneous TID WC  . metoprolol succinate  12.5 mg Oral BID  . mometasone-formoterol  2 puff Inhalation BID  . montelukast  10 mg Oral QHS  . niacin  1,000 mg Oral QHS  . pantoprazole  40 mg Oral Daily  . pravastatin  40 mg Oral q1800  . tamsulosin  0.4 mg Oral QPC supper      ASSESSMENT AND PLAN: 79 year old male admitted 3/30 with fall -> SDH on ASA/Plavix. Antiplatelet agents initially held. Primary cardiologist JK. He is getting PT/OT, improving but had some angina. 4/3, much discussion with primary care re: ASA/Plavis. Final decision: since fall was mechanical and SDH had stabilized, best option is to restart ASA/Plavix and follow closely.    CAD (coronary artery disease) - ASA started back 4/3, Plavix restarts 4/4, on BB, no angina yesterday, continue to monitor for  symptoms.    SDH (subdural hematoma) - after a fall, reason for holding ASA/Plavix, improving.   This and other issues, per primary MD. Volume status is good. Active Problems:   DEPRESSION   GERD   BENIGN PROSTATIC HYPERTROPHY   Hyperlipidemia   Type II or unspecified type diabetes mellitus without mention of complication, uncontrolled   Leukocytosis   Chronic diastolic congestive heart failure, NYHA class 1/ECHO 2010   Chest pain   Signed, Theodore Demark , PA-C 7:01 AM 07/14/2012 Agree with above assessment. No further chest discomfort today. ASA restarted yesterday, plavix to restart today. Continue to monitor closely.  Continue ambulation. Probably home over weekend if stable.

## 2012-07-15 ENCOUNTER — Inpatient Hospital Stay (HOSPITAL_COMMUNITY): Payer: Medicare Other

## 2012-07-15 LAB — CBC
HCT: 46.1 % (ref 39.0–52.0)
Hemoglobin: 16.4 g/dL (ref 13.0–17.0)
MCH: 34.3 pg — ABNORMAL HIGH (ref 26.0–34.0)
MCHC: 35.6 g/dL (ref 30.0–36.0)
MCV: 96.4 fL (ref 78.0–100.0)
Platelets: 176 10*3/uL (ref 150–400)
RBC: 4.78 MIL/uL (ref 4.22–5.81)
RDW: 12.9 % (ref 11.5–15.5)
WBC: 11.1 10*3/uL — ABNORMAL HIGH (ref 4.0–10.5)

## 2012-07-15 LAB — GLUCOSE, CAPILLARY
Glucose-Capillary: 126 mg/dL — ABNORMAL HIGH (ref 70–99)
Glucose-Capillary: 139 mg/dL — ABNORMAL HIGH (ref 70–99)

## 2012-07-15 NOTE — Progress Notes (Signed)
Patient ID: Sean Lozano, male   DOB: 1933/06/03, 79 y.o.   MRN: [id number]    SUBJECTIVE: No complaints this am, has been walking.  Restarted ASA yesterday, Plavix today.    Marland Kitchen aspirin EC  81 mg Oral Daily  . cholecalciferol  1,000 Units Oral Daily  . clopidogrel  75 mg Oral Q breakfast  . insulin aspart  0-5 Units Subcutaneous QHS  . insulin aspart  0-9 Units Subcutaneous TID WC  . metoprolol succinate  12.5 mg Oral BID  . mometasone-formoterol  2 puff Inhalation BID  . montelukast  10 mg Oral QHS  . niacin  1,000 mg Oral QHS  . pantoprazole  40 mg Oral Daily  . pravastatin  40 mg Oral q1800  . tamsulosin  0.4 mg Oral QPC supper      Filed Vitals:   07/14/12 2100 07/14/12 2103 07/15/12 0500 07/15/12 1049  BP: 114/67  94/55 115/73  Pulse: 59  66 69  Temp: 98.1 F (36.7 C)  98.1 F (36.7 C)   TempSrc:      Resp: 18  18   Height:      Weight:      SpO2: 95% 95% 94%     Intake/Output Summary (Last 24 hours) at 07/15/12 1103 Last data filed at 07/15/12 0900  Gross per 24 hour  Intake   1120 ml  Output      0 ml  Net   1120 ml    LABS: Basic Metabolic Panel: No results found for this basename: NA, K, CL, CO2, GLUCOSE, BUN, CREATININE, CALCIUM, MG, PHOS,  in the last 72 hours Liver Function Tests: No results found for this basename: AST, ALT, ALKPHOS, BILITOT, PROT, ALBUMIN,  in the last 72 hours No results found for this basename: LIPASE, AMYLASE,  in the last 72 hours CBC: No results found for this basename: WBC, NEUTROABS, HGB, HCT, MCV, PLT,  in the last 72 hours Cardiac Enzymes:  Recent Labs  07/12/12 1950 07/13/12 0207 07/13/12 0749  TROPONINI <0.30 <0.30 <0.30   BNP: No components found with this basename: POCBNP,  D-Dimer: No results found for this basename: DDIMER,  in the last 72 hours Hemoglobin A1C: No results found for this basename: HGBA1C,  in the last 72 hours Fasting Lipid Panel: No results found for this basename: CHOL, HDL, LDLCALC,  TRIG, CHOLHDL, LDLDIRECT,  in the last 72 hours Thyroid Function Tests: No results found for this basename: TSH, T4TOTAL, FREET3, T3FREE, THYROIDAB,  in the last 72 hours Anemia Panel: No results found for this basename: VITAMINB12, FOLATE, FERRITIN, TIBC, IRON, RETICCTPCT,  in the last 72 hours  RADIOLOGY: Dg Thoracic Spine 2 View  07/09/2012  *RADIOLOGY REPORT*  Clinical Data: Back pain status post fall today.  THORACIC SPINE - 2 VIEW  Comparison: Chest radiographs 12/20/2011.  Findings: Patient is status post multilevel cervical fusion.  The thoracic alignment is stable with a mild convex right scoliosis. There is no evidence of acute fracture or paraspinal abnormality. Multiple paraspinal osteophytes are noted.  Patient is status post CABG.  Coronary artery stents are also noted.  IMPRESSION: No evidence of acute thoracic spine injury.   Original Report Authenticated By: Carey Bullocks, M.D.    Ct Head Wo Contrast  07/15/2012  *RADIOLOGY REPORT*  Clinical Data: Fall.  Followup subdural hematoma.  High blood pressure.  CT HEAD WITHOUT CONTRAST  Technique:  Contiguous axial images were obtained from the base of the skull through the vertex without  contrast.  Comparison: 07/11/2012.  Findings: Broad based right parafalcine subdural hematoma with extension along the right tentorium appears similar to the prior exam.  Maximal thickness 9.4 mm.  No new intracranial hemorrhage noted.  Small vessel disease type changes without CT evidence of large acute infarct.  No intracranial mass lesion detected on this unenhanced exam.  Vascular calcifications.  IMPRESSION: Broad based right parafalcine subdural hematoma with extension along the right tentorium appears similar to the prior exam. Maximal thickness 9.4 mm.  No new intracranial hemorrhage noted.   Original Report Authenticated By: Lacy Duverney, M.D.    Ct Head Wo Contrast  07/11/2012  *RADIOLOGY REPORT*  Clinical Data: Subdural hemorrhage  CT HEAD WITHOUT  CONTRAST  Technique:  Contiguous axial images were obtained from the base of the skull through the vertex without contrast.  Comparison: 07/09/2012  Findings: 7.4 mm interhemispheric subdural hematoma on the right is unchanged.  This extends along the right tentorium, unchanged.  No new area of hemorrhage.  Mild generalized atrophy and mild chronic microvascular ischemia. No acute infarct.  No mass lesion.  No midline shift.  IMPRESSION: Stable subdural hemorrhage.  Negative for acute infarct or new hemorrhage.  Negative for hydrocephalus.   Original Report Authenticated By: Janeece Riggers, M.D.    Ct Head Wo Contrast  07/09/2012  *RADIOLOGY REPORT*  Clinical Data:  Altered mental status after falling today.  CT HEAD WITHOUT CONTRAST CT CERVICAL SPINE WITHOUT CONTRAST  Technique:  Multidetector CT imaging of the head and cervical spine was performed following the standard protocol without intravenous contrast.  Multiplanar CT image reconstructions of the cervical spine were also generated.  Comparison:  05/23/2007.  CT HEAD  Findings: Right tentorial and parafalcine subdural hematoma, measuring 8 mm in maximum thickness on image number 21.  No associated mass effect.  Enlarged ventricles and subarachnoid spaces.  Patchy white matter low density in both cerebral hemispheres.  No skull fractures or paranasal sinus air-fluid levels.  Minimal left sphenoid sinus mucosal thickening.  IMPRESSION:  1.  Right tentorial and parafalcine subdural hematoma, as described above. 2.  Minimally progressive atrophy and chronic small vessel white matter ischemic changes. 3.  Minimal chronic sphenoid sinusitis.  CT CERVICAL SPINE  Findings: Interbody bone plug and anterior screw and plate fixation at the C4-C6 levels with normal alignment.  Solid interbody bone plug anterior fusion at the C3-4 level.  Anterior and posterior bony bridging at the C2-3 level.  Anterior and posterior spur formation at the C6-7 level and anterior spurs at  the C7-T1 and T1- T2 levels.  Facet degenerative changes at multiple levels.  No prevertebral soft tissue swelling, fractures or subluxations. Bilateral carotid artery atheromatous calcifications.  IMPRESSION:  1.  No fracture or subluxation. 2.  Postoperative and degenerative changes, as described above. 3.  Bilateral carotid artery atheromatous calcifications.  Critical Value/emergent results were called by telephone at the time of interpretation on 07/09/2012 at 1514 hours to Dr. Laveda Norman, who verbally acknowledged these results.   Original Report Authenticated By: Beckie Salts, M.D.    Ct Cervical Spine Wo Contrast  07/09/2012  *RADIOLOGY REPORT*  Clinical Data:  Altered mental status after falling today.  CT HEAD WITHOUT CONTRAST CT CERVICAL SPINE WITHOUT CONTRAST  Technique:  Multidetector CT imaging of the head and cervical spine was performed following the standard protocol without intravenous contrast.  Multiplanar CT image reconstructions of the cervical spine were also generated.  Comparison:  05/23/2007.  CT HEAD  Findings: Right tentorial and  parafalcine subdural hematoma, measuring 8 mm in maximum thickness on image number 21.  No associated mass effect.  Enlarged ventricles and subarachnoid spaces.  Patchy white matter low density in both cerebral hemispheres.  No skull fractures or paranasal sinus air-fluid levels.  Minimal left sphenoid sinus mucosal thickening.  IMPRESSION:  1.  Right tentorial and parafalcine subdural hematoma, as described above. 2.  Minimally progressive atrophy and chronic small vessel white matter ischemic changes. 3.  Minimal chronic sphenoid sinusitis.  CT CERVICAL SPINE  Findings: Interbody bone plug and anterior screw and plate fixation at the C4-C6 levels with normal alignment.  Solid interbody bone plug anterior fusion at the C3-4 level.  Anterior and posterior bony bridging at the C2-3 level.  Anterior and posterior spur formation at the C6-7 level and anterior spurs at  the C7-T1 and T1- T2 levels.  Facet degenerative changes at multiple levels.  No prevertebral soft tissue swelling, fractures or subluxations. Bilateral carotid artery atheromatous calcifications.  IMPRESSION:  1.  No fracture or subluxation. 2.  Postoperative and degenerative changes, as described above. 3.  Bilateral carotid artery atheromatous calcifications.  Critical Value/emergent results were called by telephone at the time of interpretation on 07/09/2012 at 1514 hours to Dr. Laveda Norman, who verbally acknowledged these results.   Original Report Authenticated By: Beckie Salts, M.D.    Dg Shoulder Left  07/09/2012  *RADIOLOGY REPORT*  Clinical Data: Shoulder pain.  Fall.  LEFT SHOULDER - 2+ VIEW  Comparison: Cervical spine CT 07/09/2012  Findings: The mineralization and alignment are normal.  There is no evidence of acute fracture or dislocation.  The subacromial space is preserved.  There are moderate glenohumeral degenerative changes.  Patient is status post lower cervical fusion.  The left C6 screw is absent.  IMPRESSION: No acute osseous findings.  Moderate glenohumeral degenerative changes.   Original Report Authenticated By: Carey Bullocks, M.D.     PHYSICAL EXAM General: NAD Neck: No JVD, no thyromegaly or thyroid nodule.  Lungs: Clear to auscultation bilaterally with normal respiratory effort. CV: Nondisplaced PMI.  Heart regular S1/S2, no S3/S4, no murmur.  No peripheral edema.  No carotid bruit.  Normal pedal pulses.  Abdomen: Soft, nontender, no hepatosplenomegaly, no distention.  Neurologic: Alert and oriented x 3.  Psych: Normal affect. Extremities: No clubbing or cyanosis.   TELEMETRY: Reviewed telemetry pt in NSR  ASSESSMENT AND PLAN:  79 yo with history of CAD s/p CABG and PCI was admitted after mechanical fall with SDH.  After extensive discussion, he has been restarted on ASA and Plavix.  He has been recommended to remain on long-term DAPT in the past.  No problems so far.  He is  to get a head CT this afternoon and may be discharged if stable.  Will need followup soon with Dr. Myrtis Ser.   Sean Lozano 07/15/2012 11:06 AM

## 2012-07-15 NOTE — Discharge Summary (Addendum)
DISCHARGE SUMMARY  VINEET KINNEY  MR#: [id number]  DOB:03-07-1934  Date of Admission: 07/09/2012 Date of Discharge: 07/15/2012  Attending Physician:MCCLUNG,JEFFREY T  Patient's MWN:UUVOZ Jonny Ruiz, MD  Consults: Cardiology - [facility] Neurosurgery - Dr. Jordan Likes / Dr. Phoebe Perch  Disposition: D/C Home   Follow-up Appts:     Follow-up Information   Follow up with Oliver Barre, MD. Schedule an appointment as soon as possible for a visit in 5 days.   Contact information:   [address] [place] [address] [phone]      Discharge Diagnoses: SDH  CAD  Chronic diastolic congestive heart failure, NYHA class 1/ECHO 2010  BENIGN PROSTATIC HYPERTROPHY  Type II diabetes mellitus DEPRESSION  GERD  Hyperlipidemia   Initial presentation: 79 year old male patient with history of coronary artery disease and past PCI's on Plavix and aspirin. He was sent to the hospital after a mechanical fall. He reported standing on a 3 foot tall entertainment center to adjust his TV when he lost his balance fell backward and hit his head against a concrete floor. He only remembers waking up in the custody of EMS after the fall. He had no dizziness or lightheadedness prior to the fall. He endorsed this was his third fall in the past month. All were mechanical related. In the ER CT scan revealed right tentorial and parafalcine subdural hematoma but no skull fracture nor any subluxation or fracture in the cervical spine. Left shoulder x-ray and thoracic spine were also normal. Neurosurgery was consulted by the emergency room physician who recommended medical admission for observation to the step down unit.   Hospital Course:  SDH (subdural hematoma)  -NS followed in consultation -Plavix/ASA initially held - NS recommended to continue to hold these meds for TOTAL of 2 weeks  -due to anginal type CP that resolved with SL NTG Dr. Myrtis Ser carefully reviewed risk/benefit profile and decided to  resume Plavix & ASA and monitor closely with an extended hospital stay - please see his note from 4/3  -FU CTs of head 4/1 and again on 4/5 demonstrated stability of SDH  -clinically stable on serial exam - denied HA, visual change, dizziness, focal neuro deficit, or somnolence at time of d/c  -PT/OT evaluated for gait stability post SDH  -PT worked with pt LO:VFIEPPI maneuvers   CAD (coronary artery disease)  -pt with history of NSTEMI 9/13 after Plavix and ASA were held 5 days pre procedure; because of this both the patient and his son were reluctant to allow the Medicine or NS doctors to hold these meds before situation had been reviewed by the pt's Cardiologist  -Dr. Tresa Endo rapidly evaluated the pt after admission and discussed in great detail the rationale behind holding Plavix and ASA explaining the neurologic risk of continuing initially outweighed the cardiac benefit -cont Beta blocker  -see above regarding resumption of Plavix and ASA  -no CP at time of d/c home   Chronic diastolic congestive heart failure, NYHA class 1/ECHO 2010  -compensated th/o this hospital stay  BENIGN PROSTATIC HYPERTROPHY  -no difficulty with urinary retention  -cont Flomax   Type II or unspecified type diabetes mellitus without mention of complication  -HgbA1c 6.5  -diet controlled - no change in chronic tx plan   DEPRESSION  -well compensated   GERD  -PPI   Hyperlipidemia  -Pravachol/Niacin     Medication List    TAKE these medications       ADVAIR DISKUS 250-50 MCG/DOSE  Aepb  Generic drug:  Fluticasone-Salmeterol  Inhale 1 puff into the lungs every 12 (twelve) hours.     aspirin EC 81 MG EC tablet  Generic drug:  aspirin  Take 81 mg by mouth daily.     clopidogrel 75 MG tablet  Commonly known as:  PLAVIX  Take 1 tablet (75 mg total) by mouth daily.     diphenhydrAMINE 25 MG tablet  Commonly known as:  BENADRYL  Take 25 mg by mouth daily as needed for allergies.     glucose  blood test strip  Commonly known as:  ONE TOUCH ULTRA TEST  Use as instructed once daily to check blood sugar.  Diagnosis code 250.02     loratadine 10 MG tablet  Commonly known as:  CLARITIN  Take 10 mg by mouth daily as needed for allergies.     metoprolol succinate 25 MG 24 hr tablet  Commonly known as:  TOPROL-XL  Take 12.5 mg by mouth 2 (two) times daily.     montelukast 10 MG tablet  Commonly known as:  SINGULAIR  TAKE 1 TABLET BY MOUTH AT BEDTIME     niacin 1000 MG CR tablet  Commonly known as:  NIASPAN  Take 1,000 mg by mouth at bedtime. Take with aspirin     nitroGLYCERIN 0.4 MG SL tablet  Commonly known as:  NITROSTAT  Place 0.4 mg under the tongue every 5 (five) minutes as needed. For chest pain     nitroGLYCERIN 0.4 MG/SPRAY spray  Commonly known as:  NITROLINGUAL  Place 1 spray under the tongue every 5 (five) minutes as needed for chest pain. 1 spray under tongue every 5 minutes as needed for chest pain (up to 3 doses)     pantoprazole 40 MG tablet  Commonly known as:  PROTONIX  TAKE 1 TABLET (40 MG TOTAL) BY MOUTH DAILY.     pravastatin 40 MG tablet  Commonly known as:  PRAVACHOL  Take 40 mg by mouth every evening.     PROVENTIL HFA 108 (90 BASE) MCG/ACT inhaler  Generic drug:  albuterol  Inhale 2 puffs into the lungs every 6 (six) hours as needed. For shortness of breath or wheezing     tamsulosin 0.4 MG Caps  Commonly known as:  FLOMAX  Take 0.4 mg by mouth daily after supper.     Vitamin D3 2000 UNITS Tabs  Take 1 tablet by mouth daily.        Day of Discharge BP 130/83  Pulse 66  Temp(Src) 97.5 F (36.4 C) (Oral)  Resp 18  Ht 5\' 11"  (1.803 m)  Wt 96.163 kg (212 lb)  BMI 29.58 kg/m2  SpO2 95%  Physical Exam: General: No acute respiratory distress Lungs: Clear to auscultation bilaterally without wheezes or crackles Cardiovascular: Regular rate and rhythm without murmur gallop or rub normal  Abdomen: Nontender, nondistended, soft, bowel  sounds positive, no rebound, no ascites, no appreciable mass Extremities: No significant cyanosis, clubbing, or edema bilateral lower extremities Neuro:  Alert and oriented x4, CN II-XII intact, no focal deficits   Subjective:  Denies HA, visual change, focal neuro deficit, dizziness/vertigo, somnolence, or confusion.   CBC     Status: Abnormal   Collection Time    07/15/12 11:33 AM      Result Value Range   WBC 11.1 (*) 4.0 - 10.5 K/uL   RBC 4.78  4.22 - 5.81 MIL/uL   Hemoglobin 16.4  13.0 - 17.0 g/dL   HCT 46.1  39.0 - 52.0 %   MCV 96.4  78.0 - 100.0 fL   MCH 34.3 (*) 26.0 - 34.0 pg   MCHC 35.6  30.0 - 36.0 g/dL   RDW 11.9  14.7 - 82.9 %   Platelets 176  150 - 400 K/uL    Time spent in discharge (includes decision making & examination of pt): >35 minutes  07/15/2012, 4:31 PM   Lonia Blood, MD [facility] Office  [phone] Pager [phone]  On-Call/Text Page:      [email or web address]      password [facility]

## 2012-07-16 LAB — GLUCOSE, CAPILLARY: Glucose-Capillary: 122 mg/dL — ABNORMAL HIGH (ref 70–99)

## 2012-07-17 ENCOUNTER — Encounter: Payer: Self-pay | Admitting: Cardiology

## 2012-07-17 ENCOUNTER — Other Ambulatory Visit: Payer: Self-pay | Admitting: Internal Medicine

## 2012-07-17 ENCOUNTER — Encounter: Payer: Self-pay | Admitting: Internal Medicine

## 2012-07-17 DIAGNOSIS — R42 Dizziness and giddiness: Secondary | ICD-10-CM

## 2012-07-17 DIAGNOSIS — F0781 Postconcussional syndrome: Secondary | ICD-10-CM

## 2012-07-17 DIAGNOSIS — R269 Unspecified abnormalities of gait and mobility: Secondary | ICD-10-CM

## 2012-07-17 NOTE — Telephone Encounter (Signed)
Per pt email  In [facility] 3/30-4/5 from fall which resulted in concussion.Had both Physical & Occupational Therapy while there & it was suggested that I continue @ [facility] [address] Thought I was scheduled to see you later this month but it is not until 6/10. Still having some Vertigo & dizzyness, Do you want to see me before Referring for the Rehab? Wallace Cullens [phone]

## 2012-07-20 ENCOUNTER — Encounter: Payer: Self-pay | Admitting: Internal Medicine

## 2012-07-20 ENCOUNTER — Ambulatory Visit (INDEPENDENT_AMBULATORY_CARE_PROVIDER_SITE_OTHER): Payer: 59 | Admitting: Internal Medicine

## 2012-07-20 ENCOUNTER — Telehealth: Payer: Self-pay | Admitting: Internal Medicine

## 2012-07-20 VITALS — BP 102/72 | HR 66 | Temp 97.3°F | Ht 71.5 in | Wt 210.5 lb

## 2012-07-20 DIAGNOSIS — S065X9A Traumatic subdural hemorrhage with loss of consciousness of unspecified duration, initial encounter: Secondary | ICD-10-CM

## 2012-07-20 DIAGNOSIS — I5032 Chronic diastolic (congestive) heart failure: Secondary | ICD-10-CM

## 2012-07-20 DIAGNOSIS — IMO0001 Reserved for inherently not codable concepts without codable children: Secondary | ICD-10-CM

## 2012-07-20 DIAGNOSIS — I6200 Nontraumatic subdural hemorrhage, unspecified: Secondary | ICD-10-CM

## 2012-07-20 MED ORDER — MOMETASONE FURO-FORMOTEROL FUM 100-5 MCG/ACT IN AERO: 2.0000 | INHALATION_SPRAY | Freq: Two times a day (BID) | RESPIRATORY_TRACT | Status: DC

## 2012-07-20 MED ORDER — CYCLOBENZAPRINE HCL 5 MG PO TABS: 5.0000 mg | ORAL_TABLET | Freq: Three times a day (TID) | ORAL | Status: DC | PRN

## 2012-07-20 MED ORDER — TRAMADOL HCL 50 MG PO TABS: 50.0000 mg | ORAL_TABLET | Freq: Three times a day (TID) | ORAL | Status: DC | PRN

## 2012-07-20 NOTE — Assessment & Plan Note (Signed)
stable overall by history and exam, and pt to continue medical treatment as before,  to f/u any worsening symptoms or concerns, has card f/u planned

## 2012-07-20 NOTE — Progress Notes (Signed)
Subjective:    Patient ID: Sean Lozano, male    DOB: 01/08/1934, 79 y.o.   MRN: [id number]  HPI  Here to f/u after recent hospn for SDH related to fall. Has ongoing diffuse HA, but Pt denies new neurological symptoms such as facial or extremity weakness or numbness .  Has walker at home, not using.  Last head CT apr 4 stable.  Now resumed plavix, no overt bleeding or bruising.  Has some mild lightheadedness on standing or first getting up in the am. No further falls.  Pt denies fever, wt loss, night sweats, loss of appetite, or other constitutional symptoms   Pt denies polydipsia, polyuria.  Pt denies chest pain, increased sob or doe, wheezing, orthopnea, PND, increased LE swelling, palpitations, dizziness or syncope. Denies worsening depressive symptoms, suicidal ideation, or panic Past Medical History  Diagnosis Date  . CAD (coronary artery disease)     a. CABG x 6 in 1992. b. s/p DES x 2 to SVG -> OM1/OM2 in 09/2010. c. s/p DES to mid LAD 10/2010. d. NSTEMI (off DAPT) s/p PTCA of instent renarrowing of DES in SVG-OM, Recs for lifelong DAPT   . Hyperlipidemia     intolerance to Crestor Zocor Vytorin. He tolerates Pravachol...low HDL  . GERD (gastroesophageal reflux disease)   . Rosacea   . Prostatitis   . Depression   . Benign prostatic hypertrophy   . Asthma   . Paresthesias     successful surgery by Dr. Julio Sicks  . Arthritis   . Diabetes mellitus     type II-diet  . Diverticulosis   . Hx of CABG     1992  . Ejection fraction     EF 60%, echo, June, 2012  . BARRETTS ESOPHAGUS 06/02/2007  . Bladder neck obstruction 09/29/2010  . SKIN LESION 06/02/2007  . 1St degree AV block   . Sinus bradycardia     asymptomatic   . Cervical disc disease    Past Surgical History  Procedure Laterality Date  . C-spine disc surgery  03/2006  . C4-5 surgery  01/2009  . Cardiac catheterization      stent placed  . Coronary artery bypass graft    . Appendectomy    . Tonsillectomy    . Coccyx  fracture surgery  1997    reports that he has quit smoking. He started smoking about 57 years ago. He does not have any smokeless tobacco history on file. He reports that  drinks alcohol. He reports that he does not use illicit drugs. family history includes Aneurysm in an unspecified family member; Diabetes in his sister; Heart disease in his father; Hyperlipidemia in an unspecified family member; and Liver cancer in an unspecified family member. Allergies  Allergen Reactions  . Atenolol Other (See Comments)    depression  . Codeine Other (See Comments)    Crazy feeling  . Ezetimibe-Simvastatin Other (See Comments)    Severe hip pain  . Rosuvastatin Other (See Comments)    Severe hip pain  . Simvastatin Other (See Comments)    Severe hip pain   Current Outpatient Prescriptions on File Prior to Visit  Medication Sig Dispense Refill  . albuterol (PROVENTIL HFA) 108 (90 BASE) MCG/ACT inhaler Inhale 2 puffs into the lungs every 6 (six) hours as needed. For shortness of breath or wheezing      . aspirin (ASPIRIN EC) 81 MG EC tablet Take 81 mg by mouth daily.        Marland Kitchen  Cholecalciferol (VITAMIN D3) 2000 UNITS TABS Take 1 tablet by mouth daily.       . clopidogrel (PLAVIX) 75 MG tablet Take 1 tablet (75 mg total) by mouth daily.  30 tablet  5  . diphenhydrAMINE (BENADRYL) 25 MG tablet Take 25 mg by mouth daily as needed for allergies.       Marland Kitchen glucose blood (ONE TOUCH ULTRA TEST) test strip Use as instructed once daily to check blood sugar.  Diagnosis code 250.02  100 each  2  . loratadine (CLARITIN) 10 MG tablet Take 10 mg by mouth daily as needed for allergies.       . metoprolol succinate (TOPROL-XL) 25 MG 24 hr tablet Take 12.5 mg by mouth 2 (two) times daily.      . montelukast (SINGULAIR) 10 MG tablet TAKE 1 TABLET BY MOUTH AT BEDTIME  30 tablet  11  . niacin (NIASPAN) 1000 MG CR tablet Take 1,000 mg by mouth at bedtime. Take with aspirin      . nitroGLYCERIN (NITROLINGUAL) 0.4 MG/SPRAY  spray Place 1 spray under the tongue every 5 (five) minutes as needed for chest pain. 1 spray under tongue every 5 minutes as needed for chest pain (up to 3 doses)      . nitroGLYCERIN (NITROSTAT) 0.4 MG SL tablet Place 0.4 mg under the tongue every 5 (five) minutes as needed. For chest pain      . pantoprazole (PROTONIX) 40 MG tablet TAKE 1 TABLET (40 MG TOTAL) BY MOUTH DAILY.  90 tablet  1  . pravastatin (PRAVACHOL) 40 MG tablet Take 40 mg by mouth every evening.      . tamsulosin (FLOMAX) 0.4 MG CAPS Take 0.4 mg by mouth daily after supper.       No current facility-administered medications on file prior to visit.   Review of Systems  Constitutional: Negative for unexpected weight change, or unusual diaphoresis  HENT: Negative for tinnitus.   Eyes: Negative for photophobia and visual disturbance.  Respiratory: Negative for choking and stridor.   Gastrointestinal: Negative for vomiting and blood in stool.  Genitourinary: Negative for hematuria and decreased urine volume.  Musculoskeletal: Negative for acute joint swelling Skin: Negative for color change and wound.  Neurological: Negative for tremors and numbness other than noted  Psychiatric/Behavioral: Negative for decreased concentration or  hyperactivity.       Objective:   Physical Exam BP 102/72  Pulse 66  Temp(Src) 97.3 F (36.3 C) (Oral)  Ht 5' 11.5" (1.816 m)  Wt 210 lb 8 oz (95.482 kg)  BMI 28.95 kg/m2  SpO2 94% VS noted,  Constitutional: Pt appears well-developed and well-nourished.  HENT: Head: NCAT.  Right Ear: External ear normal.  Left Ear: External ear normal.  Eyes: Conjunctivae and EOM are normal. Pupils are equal, round, and reactive to light.  Neck: Normal range of motion. Neck supple.  Cardiovascular: Normal rate and regular rhythm.   Pulmonary/Chest: Effort normal and breath sounds normal.  Abd:  Soft, NT, non-distended, + BS Neurological: Pt is alert. Motor 5/5  Skin: Skin is warm. No erythema. No LE  edema Psychiatric: Pt behavior is normal.    Assessment & Plan:

## 2012-07-20 NOTE — Telephone Encounter (Signed)
Patient Information:  Caller Name: [place]  Phone: [phone]  Patient: Sean Lozano, Sean Lozano  Gender: Male  DOB: 1933-12-15  Age: 79 Years  PCP: Oliver Barre (Adults only)  Office Follow Up:  Does the office need to follow up with this patient?: No  Instructions For The Office: N/A  RN Note:  Patient fell suffering a concussion on 07/09/12 had subdural hematoma between the 2 hemispheres of brain per patient.  Blood thinners stopped at the time but have been resumed on 04/3 or 3; Patient states is doing ok except for recurrent headaches that are relieved with Tylenol but quickly returns.  Rates the pain as a 7-8/10 pain scale.  Triaged with a disposition to be seen today.  Appointment made for patient to see Dr. Jonny Ruiz today at 11:00.  Caller demonstrated his understanding.  Symptoms  Reason For Call & Symptoms: Larey Seat a week and suffered a concussion.  Was discharged on 07/15/12- having recurrent headache that is helped with Tylenol, but they always come back.  Reviewed Health History In EMR: Yes  Reviewed Medications In EMR: Yes  Reviewed Allergies In EMR: Yes  Reviewed Surgeries / Procedures: Yes  Date of Onset of Symptoms: 07/09/2012  Treatments Tried: Helps, but they come back.  Treatments Tried Worked: Yes  Guideline(s) Used:  Concussion (mTBI) Less Than 14 Days Ago Follow-Up Call  Disposition Per Guideline:   See Today in Office  Reason For Disposition Reached:   [1] Concussion symptoms staying SAME (not worse) AND [2] age > 60 years  Advice Given:  Call Back If:   You become worse.  Patient Will Follow Care Advice:  YES  Appointment Scheduled:  07/20/2012 11:00:00 Appointment Scheduled Provider:  Oliver Barre (Adults only)

## 2012-07-20 NOTE — Patient Instructions (Addendum)
Please take all new medication as prescribed - the tramadol, and the dulera Please continue all other medications as before, and refills have been done if requested. Please keep your appointments with your specialists as you have planned - Physical Therapy, and Cardiology Please continue your efforts at being more active, low cholesterol diet, and weight control Thank you for enrolling in MyChart. Please follow the instructions below to securely access your online medical record. MyChart allows you to send messages to your doctor, view your test results, renew your prescriptions, schedule appointments, and more. Please return in 6 months, or sooner if needed

## 2012-07-20 NOTE — Assessment & Plan Note (Signed)
stable overall by history and exam, recent data reviewed with pt, and pt to continue medical treatment as before,  to f/u any worsening symptoms or concerns Lab Results  Component Value Date   HGBA1C 6.5* 07/09/2012

## 2012-07-20 NOTE — Assessment & Plan Note (Signed)
Stable neuro exam, for pain control,  to f/u any worsening symptoms or concerns

## 2012-07-22 ENCOUNTER — Emergency Department (HOSPITAL_COMMUNITY)
Admission: EM | Admit: 2012-07-22 | Discharge: 2012-07-22 | Disposition: A | Discharge status: Home / Self Care | Payer: 59 | Attending: Emergency Medicine | Admitting: Emergency Medicine

## 2012-07-22 ENCOUNTER — Emergency Department (HOSPITAL_COMMUNITY): Payer: 59

## 2012-07-22 ENCOUNTER — Encounter (HOSPITAL_COMMUNITY): Payer: Self-pay | Admitting: *Deleted

## 2012-07-22 DIAGNOSIS — I2510 Atherosclerotic heart disease of native coronary artery without angina pectoris: Secondary | ICD-10-CM | POA: Insufficient documentation

## 2012-07-22 DIAGNOSIS — E785 Hyperlipidemia, unspecified: Secondary | ICD-10-CM | POA: Insufficient documentation

## 2012-07-22 DIAGNOSIS — T50905A Adverse effect of unspecified drugs, medicaments and biological substances, initial encounter: Secondary | ICD-10-CM

## 2012-07-22 DIAGNOSIS — N400 Enlarged prostate without lower urinary tract symptoms: Secondary | ICD-10-CM | POA: Insufficient documentation

## 2012-07-22 DIAGNOSIS — Z8739 Personal history of other diseases of the musculoskeletal system and connective tissue: Secondary | ICD-10-CM | POA: Insufficient documentation

## 2012-07-22 DIAGNOSIS — K219 Gastro-esophageal reflux disease without esophagitis: Secondary | ICD-10-CM | POA: Insufficient documentation

## 2012-07-22 DIAGNOSIS — Z87828 Personal history of other (healed) physical injury and trauma: Secondary | ICD-10-CM | POA: Insufficient documentation

## 2012-07-22 DIAGNOSIS — Z8719 Personal history of other diseases of the digestive system: Secondary | ICD-10-CM | POA: Insufficient documentation

## 2012-07-22 DIAGNOSIS — R5381 Other malaise: Secondary | ICD-10-CM | POA: Insufficient documentation

## 2012-07-22 DIAGNOSIS — Z79899 Other long term (current) drug therapy: Secondary | ICD-10-CM | POA: Insufficient documentation

## 2012-07-22 DIAGNOSIS — R630 Anorexia: Secondary | ICD-10-CM | POA: Insufficient documentation

## 2012-07-22 DIAGNOSIS — Z87891 Personal history of nicotine dependence: Secondary | ICD-10-CM | POA: Insufficient documentation

## 2012-07-22 DIAGNOSIS — Z8679 Personal history of other diseases of the circulatory system: Secondary | ICD-10-CM | POA: Insufficient documentation

## 2012-07-22 DIAGNOSIS — Z7902 Long term (current) use of antithrombotics/antiplatelets: Secondary | ICD-10-CM | POA: Insufficient documentation

## 2012-07-22 DIAGNOSIS — Z872 Personal history of diseases of the skin and subcutaneous tissue: Secondary | ICD-10-CM | POA: Insufficient documentation

## 2012-07-22 DIAGNOSIS — T398X5A Adverse effect of other nonopioid analgesics and antipyretics, not elsewhere classified, initial encounter: Secondary | ICD-10-CM | POA: Insufficient documentation

## 2012-07-22 DIAGNOSIS — R112 Nausea with vomiting, unspecified: Secondary | ICD-10-CM

## 2012-07-22 DIAGNOSIS — M549 Dorsalgia, unspecified: Secondary | ICD-10-CM | POA: Insufficient documentation

## 2012-07-22 DIAGNOSIS — R51 Headache: Secondary | ICD-10-CM | POA: Insufficient documentation

## 2012-07-22 DIAGNOSIS — E119 Type 2 diabetes mellitus without complications: Secondary | ICD-10-CM | POA: Insufficient documentation

## 2012-07-22 DIAGNOSIS — J45909 Unspecified asthma, uncomplicated: Secondary | ICD-10-CM | POA: Insufficient documentation

## 2012-07-22 DIAGNOSIS — Z9181 History of falling: Secondary | ICD-10-CM | POA: Insufficient documentation

## 2012-07-22 DIAGNOSIS — Z87448 Personal history of other diseases of urinary system: Secondary | ICD-10-CM | POA: Insufficient documentation

## 2012-07-22 DIAGNOSIS — Z8659 Personal history of other mental and behavioral disorders: Secondary | ICD-10-CM | POA: Insufficient documentation

## 2012-07-22 DIAGNOSIS — Z7982 Long term (current) use of aspirin: Secondary | ICD-10-CM | POA: Insufficient documentation

## 2012-07-22 LAB — URINALYSIS, ROUTINE W REFLEX MICROSCOPIC
Bilirubin Urine: NEGATIVE
Glucose, UA: NEGATIVE mg/dL
Hgb urine dipstick: NEGATIVE
Ketones, ur: 15 mg/dL — AB
Leukocytes, UA: NEGATIVE
Nitrite: NEGATIVE
Protein, ur: NEGATIVE mg/dL
Specific Gravity, Urine: 1.009 (ref 1.005–1.030)
Urobilinogen, UA: 1 mg/dL (ref 0.0–1.0)
pH: 6.5 (ref 5.0–8.0)

## 2012-07-22 LAB — COMPREHENSIVE METABOLIC PANEL
ALT: 16 U/L (ref 0–53)
AST: 20 U/L (ref 0–37)
Albumin: 4 g/dL (ref 3.5–5.2)
Alkaline Phosphatase: 66 U/L (ref 39–117)
BUN: 10 mg/dL (ref 6–23)
CO2: 24 mEq/L (ref 19–32)
Calcium: 9.7 mg/dL (ref 8.4–10.5)
Chloride: 99 mEq/L (ref 96–112)
Creatinine, Ser: 0.95 mg/dL (ref 0.50–1.35)
GFR calc Af Amer: >90 mL/min (ref >90)
GFR calc non Af Amer: 78 mL/min — ABNORMAL LOW (ref >90)
Glucose, Bld: 130 mg/dL — ABNORMAL HIGH (ref 70–99)
Potassium: 3.9 mEq/L (ref 3.5–5.1)
Sodium: 133 mEq/L — ABNORMAL LOW (ref 135–145)
Total Bilirubin: 0.7 mg/dL (ref 0.3–1.2)
Total Protein: 7.6 g/dL (ref 6.0–8.3)

## 2012-07-22 LAB — TROPONIN I
Troponin I: <0.3 ng/mL (ref <0.30)
Troponin I: <0.3 ng/mL (ref <0.30)

## 2012-07-22 LAB — CBC WITH DIFFERENTIAL/PLATELET
Basophils Absolute: 0 10*3/uL (ref 0.0–0.1)
Basophils Relative: 0 % (ref 0–1)
Eosinophils Absolute: 0.1 10*3/uL (ref 0.0–0.7)
Eosinophils Relative: 1 % (ref 0–5)
HCT: 46.4 % (ref 39.0–52.0)
Hemoglobin: 17.1 g/dL — ABNORMAL HIGH (ref 13.0–17.0)
Lymphocytes Relative: 22 % (ref 12–46)
Lymphs Abs: 2.7 10*3/uL (ref 0.7–4.0)
MCH: 34.5 pg — ABNORMAL HIGH (ref 26.0–34.0)
MCHC: 36.9 g/dL — ABNORMAL HIGH (ref 30.0–36.0)
MCV: 93.7 fL (ref 78.0–100.0)
Monocytes Absolute: 0.8 10*3/uL (ref 0.1–1.0)
Monocytes Relative: 6 % (ref 3–12)
Neutro Abs: 8.6 10*3/uL — ABNORMAL HIGH (ref 1.7–7.7)
Neutrophils Relative %: 70 % (ref 43–77)
Platelets: 213 10*3/uL (ref 150–400)
RBC: 4.95 MIL/uL (ref 4.22–5.81)
RDW: 12.4 % (ref 11.5–15.5)
WBC: 12.3 10*3/uL — ABNORMAL HIGH (ref 4.0–10.5)

## 2012-07-22 LAB — PROTIME-INR
INR: 1.01 (ref 0.00–1.49)
Prothrombin Time: 13.2 seconds (ref 11.6–15.2)

## 2012-07-22 MED ORDER — SODIUM CHLORIDE 0.9 % IV SOLN
Freq: Once | INTRAVENOUS | Status: AC
  Administered 2012-07-22: 21:00:00 via INTRAVENOUS

## 2012-07-22 MED ORDER — ONDANSETRON HCL 4 MG PO TABS: 4.0000 mg | ORAL_TABLET | Freq: Four times a day (QID) | ORAL | Status: DC

## 2012-07-22 MED ORDER — HYDROCODONE-ACETAMINOPHEN 5-325 MG PO TABS
2.0000 | ORAL_TABLET | ORAL | Status: DC | PRN
Start: 2012-07-22 — End: 2012-07-24

## 2012-07-22 MED ORDER — ONDANSETRON HCL 4 MG/2ML IJ SOLN
4.0000 mg | Freq: Once | INTRAMUSCULAR | Status: AC
  Administered 2012-07-22: 4 mg via INTRAVENOUS
  Filled 2012-07-22: qty 2

## 2012-07-22 NOTE — ED Notes (Signed)
Pt given sprite. Tolerated well.

## 2012-07-22 NOTE — ED Notes (Signed)
No other c/o pain

## 2012-07-22 NOTE — ED Notes (Signed)
Pt states N/V x 5, Mild HA left frontal region, which decreased the pain without resolving it. Recent discharged from hospital last Saturday for a fall

## 2012-07-22 NOTE — ED Provider Notes (Signed)
History     CSN: [id number]  Arrival date & time 07/22/12  4098   First MD Initiated Contact with Patient 07/22/12 1953      Chief Complaint  Patient presents with  . Emesis    (Consider location/radiation/quality/duration/timing/severity/associated sxs/prior treatment) HPI Comments: Patient presents with a one-day history of nausea and vomiting. Last episode of vomiting was at 2 PM. States he's felt nauseated ever since being placed on tramadol by his PCP 5 days ago. He saw his PCP with recurrent headache after falling in developing a subdural hematoma. He was discharged from the hospital one week ago. He states the pain has gradually progressed in his right frontal area. Denies thunderclap onset. He is on aspirin Plavix but not Coumadin. His anticoagulation was held but restarted before discharge. Patient admits to feeling nauseated after taking the tramadol today the first day he vomited. Denies abdominal pain, diarrhea, fever, urinary symptoms. No chest pain or shortness of breath.  The history is provided by the patient and the spouse.    Past Medical History  Diagnosis Date  . CAD (coronary artery disease)     a. CABG x 6 in 1992. b. s/p DES x 2 to SVG -> OM1/OM2 in 09/2010. c. s/p DES to mid LAD 10/2010. d. NSTEMI (off DAPT) s/p PTCA of instent renarrowing of DES in SVG-OM, Recs for lifelong DAPT   . Hyperlipidemia     intolerance to Crestor Zocor Vytorin. He tolerates Pravachol...low HDL  . GERD (gastroesophageal reflux disease)   . Rosacea   . Prostatitis   . Depression   . Benign prostatic hypertrophy   . Asthma   . Paresthesias     successful surgery by Dr. Julio Sicks  . Arthritis   . Diabetes mellitus     type II-diet  . Diverticulosis   . Hx of CABG     1992  . Ejection fraction     EF 60%, echo, June, 2012  . BARRETTS ESOPHAGUS 06/02/2007  . Bladder neck obstruction 09/29/2010  . SKIN LESION 06/02/2007  . 1St degree AV block   . Sinus bradycardia    asymptomatic   . Cervical disc disease     Past Surgical History  Procedure Laterality Date  . C-spine disc surgery  03/2006  . C4-5 surgery  01/2009  . Cardiac catheterization      stent placed  . Coronary artery bypass graft    . Appendectomy    . Tonsillectomy    . Coccyx fracture surgery  1997    Family History  Problem Relation Age of Onset  . Heart disease Father     mother  . Aneurysm    . Hyperlipidemia    . Liver cancer      grandfather  . Diabetes Sister     mother    History  Substance Use Topics  . Smoking status: Former Smoker    Start date: 01/03/1955  . Smokeless tobacco: Not on file  . Alcohol Use: Yes      Review of Systems  Constitutional: Positive for activity change and appetite change.  Respiratory: Negative for cough, chest tightness and shortness of breath.   Cardiovascular: Negative for chest pain.  Gastrointestinal: Positive for nausea and vomiting. Negative for abdominal pain and diarrhea.  Genitourinary: Negative for dysuria, hematuria and testicular pain.  Musculoskeletal: Positive for back pain.  Skin: Negative for rash.  Neurological: Positive for weakness and headaches. Negative for dizziness and light-headedness.  A complete 10 system  review of systems was obtained and all systems are negative except as noted in the HPI and PMH.    Allergies  Atenolol; Codeine; Ezetimibe-simvastatin; Rosuvastatin; and Simvastatin  Home Medications   Current Outpatient Rx  Name  Route  Sig  Dispense  Refill  . albuterol (PROVENTIL HFA) 108 (90 BASE) MCG/ACT inhaler   Inhalation   Inhale 2 puffs into the lungs every 6 (six) hours as needed. For shortness of breath or wheezing         . aspirin (ASPIRIN EC) 81 MG EC tablet   Oral   Take 81 mg by mouth daily.           . Cholecalciferol (VITAMIN D3) 2000 UNITS TABS   Oral   Take 1 tablet by mouth daily.          . clopidogrel (PLAVIX) 75 MG tablet   Oral   Take 1 tablet (75 mg  total) by mouth daily.   30 tablet   5   . cyclobenzaprine (FLEXERIL) 5 MG tablet   Oral   Take 1 tablet (5 mg total) by mouth 3 (three) times daily as needed for muscle spasms.   90 tablet   1   . diphenhydrAMINE (BENADRYL) 25 MG tablet   Oral   Take 25 mg by mouth daily as needed for allergies.          Marland Kitchen glucose blood (ONE TOUCH ULTRA TEST) test strip      Use as instructed once daily to check blood sugar.  Diagnosis code 250.02   100 each   2   . loratadine (CLARITIN) 10 MG tablet   Oral   Take 10 mg by mouth daily as needed for allergies.          . metoprolol succinate (TOPROL-XL) 25 MG 24 hr tablet   Oral   Take 12.5 mg by mouth 2 (two) times daily.         . mometasone-formoterol (DULERA) 100-5 MCG/ACT AERO   Inhalation   Inhale 2 puffs into the lungs 2 (two) times daily.   13 g   11   . montelukast (SINGULAIR) 10 MG tablet      TAKE 1 TABLET BY MOUTH AT BEDTIME   30 tablet   11   . niacin (NIASPAN) 1000 MG CR tablet   Oral   Take 1,000 mg by mouth at bedtime. Take with aspirin         . nitroGLYCERIN (NITROLINGUAL) 0.4 MG/SPRAY spray   Sublingual   Place 1 spray under the tongue every 5 (five) minutes as needed for chest pain. 1 spray under tongue every 5 minutes as needed for chest pain (up to 3 doses)         . nitroGLYCERIN (NITROSTAT) 0.4 MG SL tablet   Sublingual   Place 0.4 mg under the tongue every 5 (five) minutes as needed. For chest pain         . pantoprazole (PROTONIX) 40 MG tablet      TAKE 1 TABLET (40 MG TOTAL) BY MOUTH DAILY.   90 tablet   1   . pravastatin (PRAVACHOL) 40 MG tablet   Oral   Take 40 mg by mouth every evening.         . tamsulosin (FLOMAX) 0.4 MG CAPS   Oral   Take 0.4 mg by mouth daily after supper.         . traMADol (ULTRAM) 50 MG tablet  Oral   Take 1 tablet (50 mg total) by mouth every 8 (eight) hours as needed for pain.   60 tablet   0   . HYDROcodone-acetaminophen (NORCO/VICODIN)  5-325 MG per tablet   Oral   Take 2 tablets by mouth every 4 (four) hours as needed for pain.   10 tablet   0   . ondansetron (ZOFRAN) 4 MG tablet   Oral   Take 1 tablet (4 mg total) by mouth every 6 (six) hours.   12 tablet   0     BP 110/76  Pulse 77  Temp(Src) 98.2 F (36.8 C) (Oral)  Resp 20  SpO2 94%  Physical Exam  Constitutional: He is oriented to person, place, and time. He appears well-developed and well-nourished. No distress.  HENT:  Head: Normocephalic and atraumatic.  Mouth/Throat: Oropharynx is clear and moist. No oropharyngeal exudate.  Eyes: Conjunctivae and EOM are normal. Pupils are equal, round, and reactive to light.  Neck: Normal range of motion. Neck supple.  Cardiovascular: Normal rate, regular rhythm and normal heart sounds.   No murmur heard. Pulmonary/Chest: Effort normal and breath sounds normal. No respiratory distress.  Abdominal: Soft. There is no tenderness. There is no rebound and no guarding.  Musculoskeletal: Normal range of motion. He exhibits no edema and no tenderness.  Neurological: He is alert and oriented to person, place, and time. No cranial nerve deficit. He exhibits normal muscle tone. Coordination normal.  CN 2-12 intact, 5/5 strength throughout, no ataxia on finger to nose  Skin: Skin is warm.    ED Course  Procedures (including critical care time)  Labs Reviewed  CBC WITH DIFFERENTIAL - Abnormal; Notable for the following:    WBC 12.3 (*)    Hemoglobin 17.1 (*)    MCH 34.5 (*)    MCHC 36.9 (*)    Neutro Abs 8.6 (*)    All other components within normal limits  COMPREHENSIVE METABOLIC PANEL - Abnormal; Notable for the following:    Sodium 133 (*)    Glucose, Bld 130 (*)    GFR calc non Af Amer 78 (*)    All other components within normal limits  URINALYSIS, ROUTINE W REFLEX MICROSCOPIC - Abnormal; Notable for the following:    Ketones, ur 15 (*)    All other components within normal limits  PROTIME-INR  TROPONIN I   TROPONIN I   Ct Head Wo Contrast  07/22/2012  *RADIOLOGY REPORT*  Clinical Data: 79 year old male with vomiting.  Recent subdural hematoma.  CT HEAD WITHOUT CONTRAST  Technique:  Contiguous axial images were obtained from the base of the skull through the vertex without contrast.  Comparison: 07/15/2012 and earlier.  Findings: Visualized paranasal sinuses and mastoids are clear. Visualized orbits and scalp soft tissues are within normal limits. No acute osseous abnormality identified.  Calcified atherosclerosis at the skull base.  Right parafalcine hematoma is less distinct and less dense compatible with interval expected evolution.  Volume also appears decreased.  Maximum thickness is the same at 9-10 mm.  There is now only minimal blood layering on the tentorium.  No other extra-axial hemorrhage identified.  No intra-axial hemorrhage.  No ventriculomegaly.  No midline shift or significant intracranial mass effect.  Basilar cisterns are patent. No suspicious intracranial vascular hyperdensity.  Scattered bilateral white matter hypodensity is stable. No evidence of cortically based acute infarction identified.  IMPRESSION: 1.  Interval expected evolution of small right para falcine subdural hematoma: mildly decreased in size and  less dense/distinct. 2.  No new intracranial abnormality.   Original Report Authenticated By: Erskine Speed, M.D.      1. Nausea and vomiting   2. Medication adverse effect, initial encounter       MDM  One-day history of nausea and vomiting after being started on tramadol. Recent subdural hematoma.  No focal neuro deficits.  CT scan shows expected evolution of subdural hematoma. Slightly decreased in size.  Patient stable in ED. No vomiting. Tolerating by mouth. Labs at baseline. Troponin negative  Suspect nausea vomiting secondary to tramadol. We'll discontinue. Patient states he's had hydrocodone in the past without difficulty. We'll prescribe small amount. Followup  with PCP and neurosurgery as scheduled.   Date: 07/22/2012  Rate: 67  Rhythm: normal sinus rhythm  QRS Axis: normal  Intervals: normal  ST/T Wave abnormalities: normal  Conduction Disutrbances:first-degree A-V block   Narrative Interpretation:   Old EKG Reviewed: unchanged    Glynn Octave, MD 07/22/12 2309

## 2012-07-22 NOTE — ED Notes (Signed)
Pt dc to home.  Pt states understanding to dc instructions.  Pt ambulatory to exit without difficulty.  Denies need for w/c. 

## 2012-07-24 ENCOUNTER — Telehealth: Payer: Self-pay | Admitting: Internal Medicine

## 2012-07-24 MED ORDER — HYDROCODONE-ACETAMINOPHEN 5-325 MG PO TABS: 1.0000 | ORAL_TABLET | Freq: Four times a day (QID) | ORAL | Status: DC | PRN

## 2012-07-24 NOTE — Telephone Encounter (Signed)
Called the patient and he went to the ER Saturday night.  They d/c the tramadol and gave him hydrocodone, the son was not sure how much was given.

## 2012-07-24 NOTE — Telephone Encounter (Signed)
N/v could be related to tramadol, ok to d/c  OK for change back to hydrocodone 5/325 - Done hardcopy to robin

## 2012-07-24 NOTE — Telephone Encounter (Signed)
[facility] Triage Call Report Triage Record Num: [id number] Operator: April Finney Patient Name: Sean Lozano Call Date & Time: 07/22/2012 6:54:38PM Patient Phone: [phone] PCP: Oliver Barre Patient Gender: Male PCP Fax : [phone] Patient DOB: 1933-07-20 Practice Name: [facility] Reason for Call: Caller: Al/Other; PCP: Oliver Barre (Adults only); CB#: [phone]; Call regarding Vomiting All Day; Larey Seat 2 weeks ago and had a concusion and was hospitalized. Headaches off and on for the past week that have been severe. Today started vomiting and continues to have nausea and dizziness. See in ED care advice given per head injury protocol due to vomited more than once since injury. Protocol(s) Used: Nausea or Vomiting Recommended Outcome per Protocol: Call Provider within 24 Hours Reason for Outcome: Symptoms began after starting or changing dose of prescription, nonprescription, alternative medication, or illicit drug Care Advice: ~ SYMPTOM / CONDITION MANAGEMENT 04/

## 2012-07-24 NOTE — Telephone Encounter (Signed)
Sorry, ok to cont the hydrocodone he already has, just trying to help

## 2012-07-25 ENCOUNTER — Ambulatory Visit: Payer: Medicare Other | Attending: Internal Medicine | Admitting: Physical Therapy

## 2012-07-25 DIAGNOSIS — R269 Unspecified abnormalities of gait and mobility: Secondary | ICD-10-CM | POA: Insufficient documentation

## 2012-07-25 DIAGNOSIS — IMO0001 Reserved for inherently not codable concepts without codable children: Secondary | ICD-10-CM | POA: Insufficient documentation

## 2012-07-25 DIAGNOSIS — H8110 Benign paroxysmal vertigo, unspecified ear: Secondary | ICD-10-CM | POA: Insufficient documentation

## 2012-07-27 ENCOUNTER — Ambulatory Visit: Payer: Medicare Other | Admitting: Physical Therapy

## 2012-07-29 ENCOUNTER — Encounter: Payer: Self-pay | Admitting: Cardiology

## 2012-07-29 DIAGNOSIS — S065X9A Traumatic subdural hemorrhage with loss of consciousness of unspecified duration, initial encounter: Secondary | ICD-10-CM | POA: Insufficient documentation

## 2012-07-31 ENCOUNTER — Ambulatory Visit (INDEPENDENT_AMBULATORY_CARE_PROVIDER_SITE_OTHER): Payer: Medicare Other | Admitting: Cardiology

## 2012-07-31 ENCOUNTER — Encounter: Payer: Self-pay | Admitting: Cardiology

## 2012-07-31 VITALS — BP 112/68 | HR 71 | Ht 71.5 in | Wt 207.0 lb

## 2012-07-31 DIAGNOSIS — I2510 Atherosclerotic heart disease of native coronary artery without angina pectoris: Secondary | ICD-10-CM

## 2012-07-31 DIAGNOSIS — R55 Syncope and collapse: Secondary | ICD-10-CM

## 2012-07-31 DIAGNOSIS — I25810 Atherosclerosis of coronary artery bypass graft(s) without angina pectoris: Secondary | ICD-10-CM

## 2012-07-31 DIAGNOSIS — R9430 Abnormal result of cardiovascular function study, unspecified: Secondary | ICD-10-CM

## 2012-07-31 DIAGNOSIS — S065X9A Traumatic subdural hemorrhage with loss of consciousness of unspecified duration, initial encounter: Secondary | ICD-10-CM

## 2012-07-31 DIAGNOSIS — R0989 Other specified symptoms and signs involving the circulatory and respiratory systems: Secondary | ICD-10-CM

## 2012-07-31 DIAGNOSIS — E785 Hyperlipidemia, unspecified: Secondary | ICD-10-CM

## 2012-07-31 DIAGNOSIS — I6200 Nontraumatic subdural hemorrhage, unspecified: Secondary | ICD-10-CM

## 2012-07-31 NOTE — Assessment & Plan Note (Signed)
The patient had had in-stent restenosis in September, 2013. It was felt that he should be kept on aspirin and Plavix indefinitely. Aspirin and Plavix were held briefly with his subdural hematoma on July 09, 2012. With very careful consideration, his aspirin and Plavix were actually started in the range of 1 week after his event. In my notes at the beginning of this report I explained that he had some pain in the hospital. We resumed his meds and fortunately he's been stable. In addition he's had a followup head CT forced nausea. This did not show any change in the area of prior bleeding. It seems that it continues to be safe to continue his aspirin and Plavix. He is not having any cardiac symptoms. No further workup at this time.

## 2012-07-31 NOTE — Assessment & Plan Note (Signed)
The patient had symptoms related to simvastatin and Crestor in the past. He is on Pravachol. Over time I will decide if we should try Lipitor. I feel it is not prudent to make that change at this time with him still having other symptoms

## 2012-07-31 NOTE — Assessment & Plan Note (Signed)
Today there is a soft carotid bruit. I reviewed very carefully and the patient has not had carotid Dopplers over time. In particular Sean Lozano did not have a carotid Doppler during his hospitalization. This needs to be done to rule out the possibility that some of his symptoms recently before his fall could be related to carotid stenoses. A carotid Doppler will be scheduled.

## 2012-07-31 NOTE — Progress Notes (Signed)
HPI   The patient returns to followup coronary disease. He also returns for a post hospital visit. On July 09, 2012, the patient was admitted to the hospital with a subdural hematoma. He had climbed a small area to work on a wall in his home. He says that the next thing he knows he woke up and he was being transported to the hospital. He fell and hit his head. He had a subdural hematoma. He did not require surgery. Looking back at his history now, he mentions that he had a few episodes that may have been falling episodes. He had not had prior episodes suggestive of true syncope. At this point we are still not completely clear if syncope played a role with this episode.  In the hospital he was evaluated by neurosurgery. It was felt that he did not need a surgical intervention. His aspirin and Plavix were put on hold. The initial plan was to hold these medicines for approximately 2 weeks. After several days in the hospital he did have one episode of chest discomfort. I was quite concerned about this. There was no EKG change. As his body he was having decreased a fax from the aspirin and Plavix and he had been on, he did not have any significant increased bleeding from his injury. Considering this and considering all factors including the fact that he had recurrent chest pain and including the fact that he had had stent thrombosis in the past, I recommended that we resume his aspirin and Plavix. I tapped about this it length with the patient and with the primary care team. We all agreed that we should take this risk. His aspirin and Plavix were resumed.  Eventually he was discharged from the hospital. He had some nausea afterwards and was seen back in the emergency room. A head CT scan did not show any further bleeding. It was felt that his symptoms might be related to tramadol. His meds were changed. He is feeling better.  He is continuing with rehabilitation after his subdural hematoma. He is not having  any significant chest pain.  As part of today's evaluation I reviewed the extensive information from his hospitalization.  Allergies  Allergen Reactions  . Atenolol Other (See Comments)    depression  . Codeine Other (See Comments)    Crazy feeling  . Ezetimibe-Simvastatin Other (See Comments)    Severe hip pain  . Rosuvastatin Other (See Comments)    Severe hip pain  . Simvastatin Other (See Comments)    Severe hip pain    Current Outpatient Prescriptions  Medication Sig Dispense Refill  . albuterol (PROVENTIL HFA) 108 (90 BASE) MCG/ACT inhaler Inhale 2 puffs into the lungs every 6 (six) hours as needed. For shortness of breath or wheezing      . aspirin (ASPIRIN EC) 81 MG EC tablet Take 81 mg by mouth daily.        . Cholecalciferol (VITAMIN D3) 2000 UNITS TABS Take 1 tablet by mouth daily.       . clopidogrel (PLAVIX) 75 MG tablet Take 1 tablet (75 mg total) by mouth daily.  30 tablet  5  . cyclobenzaprine (FLEXERIL) 5 MG tablet Take 1 tablet (5 mg total) by mouth 3 (three) times daily as needed for muscle spasms.  90 tablet  1  . diphenhydrAMINE (BENADRYL) 25 MG tablet Take 25 mg by mouth daily as needed for allergies.       Marland Kitchen glucose blood (ONE TOUCH ULTRA TEST)  test strip Use as instructed once daily to check blood sugar.  Diagnosis code 250.02  100 each  2  . HYDROcodone-acetaminophen (NORCO/VICODIN) 5-325 MG per tablet Take 1 tablet by mouth every 6 (six) hours as needed for pain.  40 tablet  0  . loratadine (CLARITIN) 10 MG tablet Take 10 mg by mouth daily as needed for allergies.       . metoprolol succinate (TOPROL-XL) 25 MG 24 hr tablet Take 12.5 mg by mouth 2 (two) times daily.      . mometasone-formoterol (DULERA) 100-5 MCG/ACT AERO Inhale 2 puffs into the lungs 2 (two) times daily.  13 g  11  . montelukast (SINGULAIR) 10 MG tablet TAKE 1 TABLET BY MOUTH AT BEDTIME  30 tablet  11  . niacin (NIASPAN) 1000 MG CR tablet Take 1,000 mg by mouth at bedtime. Take with aspirin       . nitroGLYCERIN (NITROLINGUAL) 0.4 MG/SPRAY spray Place 1 spray under the tongue every 5 (five) minutes as needed for chest pain. 1 spray under tongue every 5 minutes as needed for chest pain (up to 3 doses)      . nitroGLYCERIN (NITROSTAT) 0.4 MG SL tablet Place 0.4 mg under the tongue every 5 (five) minutes as needed. For chest pain      . ondansetron (ZOFRAN) 4 MG tablet Take 1 tablet (4 mg total) by mouth every 6 (six) hours.  12 tablet  0  . pantoprazole (PROTONIX) 40 MG tablet TAKE 1 TABLET (40 MG TOTAL) BY MOUTH DAILY.  90 tablet  1  . pravastatin (PRAVACHOL) 40 MG tablet Take 40 mg by mouth every evening.      . tamsulosin (FLOMAX) 0.4 MG CAPS Take 0.4 mg by mouth daily after supper.      . traMADol (ULTRAM) 50 MG tablet Take 1 tablet (50 mg total) by mouth every 8 (eight) hours as needed for pain.  60 tablet  0   No current facility-administered medications for this visit.    History   Social History  . Marital Status: Married    Spouse Name: N/A    Number of Children: 3  . Years of Education: N/A   Occupational History  . retired-real estate    Social History Main Topics  . Smoking status: Former Smoker    Start date: 01/03/1955  . Smokeless tobacco: Not on file  . Alcohol Use: Yes  . Drug Use: No  . Sexually Active: Not on file   Other Topics Concern  . Not on file   Social History Narrative   Pt has 51yo son-paranoid schizophrenic, unempolyed, lives with him.    Family History  Problem Relation Age of Onset  . Heart disease Father     mother  . Aneurysm    . Hyperlipidemia    . Liver cancer      grandfather  . Diabetes Sister     mother    Past Medical History  Diagnosis Date  . CAD (coronary artery disease)     a. CABG x 6 in 1992. b. s/p DES x 2 to SVG -> OM1/OM2 in 09/2010. c. s/p DES to mid LAD 10/2010. d. NSTEMI (off DAPT) s/p PTCA of instent renarrowing of DES in SVG-OM, Recs for lifelong DAPT   . Hyperlipidemia     intolerance to Crestor  Zocor Vytorin. He tolerates Pravachol...low HDL  . GERD (gastroesophageal reflux disease)   . Rosacea   . Prostatitis   . Depression   .  Benign prostatic hypertrophy   . Asthma   . Paresthesias     successful surgery by Dr. Julio Sicks  . Arthritis   . Diabetes mellitus     type II-diet  . Diverticulosis   . Hx of CABG     1992  . Ejection fraction     EF 60%, echo, June, 2012  . BARRETTS ESOPHAGUS 06/02/2007  . Bladder neck obstruction 09/29/2010  . SKIN LESION 06/02/2007  . 1St degree AV block   . Sinus bradycardia     asymptomatic   . Cervical disc disease   . Subdural hematoma     Subdural hematoma secondary to falling off a ladder  April, 2014,  medical therapy,  subdural did not expand while blood levels of ddual antiplatelet therapy decreased over one week.  Episode of chest pain in the hospital,, dual antiplatelet therapy restarted approximately one week after it was held    Past Surgical History  Procedure Laterality Date  . C-spine disc surgery  03/2006  . C4-5 surgery  01/2009  . Cardiac catheterization      stent placed  . Coronary artery bypass graft    . Appendectomy    . Tonsillectomy    . Coccyx fracture surgery  1997    Patient Active Problem List  Diagnosis  . DEPRESSION  . ASTHMA  . GERD  . BARRETTS ESOPHAGUS  . BENIGN PROSTATIC HYPERTROPHY  . Rosacea  . SKIN LESION  . FATIGUE  . Preventative health care  . Dyspnea  . Bladder neck obstruction  . Hyperlipidemia  . Type II or unspecified type diabetes mellitus without mention of complication, uncontrolled  . Hx of CABG  . Ejection fraction  . CAD (coronary artery disease)  . Erectile dysfunction  . Acute upper respiratory infections of unspecified site  . SDH (subdural hematoma)  . Chronic diastolic congestive heart failure, NYHA class 1/ECHO 2010  . Subdural hematoma    ROS   Patient denies fever, chills, headache, sweats, rash, change in vision, change in hearing, chest pain, cough,  urinary symptoms. He did have nausea related to medications. He still has residual feelings related to his head injury. All other systems are reviewed and are negative.  PHYSICAL EXAM  The patient is here with one of his sons. He is oriented to person time and place. Affect is normal. He appears stable. There is questionable soft carotid bruit. There is no jugular venous distention. Lungs are clear. Respiratory effort is nonlabored. Cardiac exam reveals S1 and S2. The abdomen is soft. There is no peripheral edema. There are no musculoskeletal deformities. There are no skin rashes.  Filed Vitals:   07/31/12 1155  BP: 112/68  Pulse: 71  Height: 5' 11.5" (1.816 m)  Weight: 207 lb (93.895 kg)  SpO2: 97%     ASSESSMENT & PLAN

## 2012-07-31 NOTE — Assessment & Plan Note (Signed)
Prior to his subdural hematoma injury, he has had some episodes with falling. None of those episodes sound like true syncope. He says that he had no warning before his episode leading to his subdural hematoma. However we both agree that he hit his head and lost consciousness. There is no definite proof of syncope. However an event recorder will be placed to be sure that we are not missing bradycardia or tachyarrhythmias.  As part of today's evaluation I spent extensive time reviewing his records and spent greater than 25 minutes in total. More than half of 25 minutes was spent with the rectal contact with the patient and his son talking about his coronary disease and the use of aspirin and Plavix and the consideration of whether he had syncope and many other issues.

## 2012-07-31 NOTE — Assessment & Plan Note (Signed)
The patient's ejection fraction was normal in September, 2013. It does not appear that an arrhythmia based on LV dysfunction played any role here.

## 2012-07-31 NOTE — Patient Instructions (Addendum)
Your physician recommends that you schedule a follow-up appointment in:   8 WEEKS WITH DR Myrtis Ser  Your physician recommends that you continue on your current medications as directed. Please refer to the Current Medication list given to you today. Your physician has recommended that you wear an event monitor. Event monitors are medical devices that record the heart's electrical activity. Doctors most often [place] these monitors to diagnose arrhythmias. Arrhythmias are problems with the speed or rhythm of the heartbeat. The monitor is a small, portable device. You can wear one while you do your normal daily activities. This is usually used to diagnose what is causing palpitations/syncope (passing out).  Your physician has requested that you have a carotid duplex. This test is an ultrasound of the carotid arteries in your neck. It looks at blood flow through these arteries that supply the brain with blood. Allow one hour for this exam. There are no restrictions or special instructions.

## 2012-07-31 NOTE — Assessment & Plan Note (Signed)
Fortunately he continues to improve from his subdural hematoma.

## 2012-08-01 ENCOUNTER — Ambulatory Visit: Payer: Medicare Other | Admitting: Physical Therapy

## 2012-08-03 ENCOUNTER — Ambulatory Visit: Payer: Medicare Other | Admitting: Physical Therapy

## 2012-08-08 ENCOUNTER — Ambulatory Visit: Payer: Medicare Other | Admitting: Physical Therapy

## 2012-08-10 ENCOUNTER — Other Ambulatory Visit: Payer: Self-pay | Admitting: *Deleted

## 2012-08-10 ENCOUNTER — Encounter (INDEPENDENT_AMBULATORY_CARE_PROVIDER_SITE_OTHER): Payer: Medicare Other

## 2012-08-10 DIAGNOSIS — I6522 Occlusion and stenosis of left carotid artery: Secondary | ICD-10-CM

## 2012-08-10 DIAGNOSIS — I25810 Atherosclerosis of coronary artery bypass graft(s) without angina pectoris: Secondary | ICD-10-CM

## 2012-08-10 DIAGNOSIS — R55 Syncope and collapse: Secondary | ICD-10-CM

## 2012-08-10 DIAGNOSIS — I6529 Occlusion and stenosis of unspecified carotid artery: Secondary | ICD-10-CM

## 2012-08-11 ENCOUNTER — Other Ambulatory Visit: Payer: Self-pay | Admitting: *Deleted

## 2012-08-11 ENCOUNTER — Ambulatory Visit: Payer: Medicare Other | Attending: Internal Medicine | Admitting: Physical Therapy

## 2012-08-11 DIAGNOSIS — R269 Unspecified abnormalities of gait and mobility: Secondary | ICD-10-CM | POA: Insufficient documentation

## 2012-08-11 DIAGNOSIS — H8110 Benign paroxysmal vertigo, unspecified ear: Secondary | ICD-10-CM | POA: Insufficient documentation

## 2012-08-11 DIAGNOSIS — IMO0001 Reserved for inherently not codable concepts without codable children: Secondary | ICD-10-CM | POA: Insufficient documentation

## 2012-08-15 ENCOUNTER — Ambulatory Visit: Payer: Medicare Other | Admitting: Physical Therapy

## 2012-08-17 ENCOUNTER — Ambulatory Visit: Payer: Medicare Other | Admitting: Physical Therapy

## 2012-08-20 ENCOUNTER — Encounter: Payer: Self-pay | Admitting: Cardiology

## 2012-08-20 DIAGNOSIS — I779 Disorder of arteries and arterioles, unspecified: Secondary | ICD-10-CM | POA: Insufficient documentation

## 2012-08-20 DIAGNOSIS — I739 Peripheral vascular disease, unspecified: Secondary | ICD-10-CM

## 2012-08-20 NOTE — Progress Notes (Signed)
   The patient recently had a syncopal episode with a subdural hematoma. When I saw him after the hospitalization her arrange for carotid Doppler. This study shows a very tight left internal carotid artery stenosis. The patient was immediately referred for vascular surgery consultation that will be done in the next few days.  Jerral Bonito, MD

## 2012-08-21 ENCOUNTER — Encounter: Payer: Self-pay | Admitting: Vascular Surgery

## 2012-08-21 ENCOUNTER — Ambulatory Visit: Payer: Medicare Other | Admitting: Physical Therapy

## 2012-08-22 ENCOUNTER — Other Ambulatory Visit (INDEPENDENT_AMBULATORY_CARE_PROVIDER_SITE_OTHER): Payer: Medicare Other | Admitting: Vascular Surgery

## 2012-08-22 ENCOUNTER — Ambulatory Visit (INDEPENDENT_AMBULATORY_CARE_PROVIDER_SITE_OTHER): Payer: Medicare Other | Admitting: Vascular Surgery

## 2012-08-22 ENCOUNTER — Encounter: Payer: Self-pay | Admitting: Vascular Surgery

## 2012-08-22 DIAGNOSIS — I6529 Occlusion and stenosis of unspecified carotid artery: Secondary | ICD-10-CM

## 2012-08-22 NOTE — Progress Notes (Signed)
Subjective:     Patient ID: Sean Lozano, male   DOB: 08/21/1933, 79 y.o.   MRN: [id number]  HPI this 79-year-old male referred by Dr. Jeff Katz for evaluation of left carotid stenosis. This patient denies any history of stroke or specific hemispheric TIAs including lateralizing weakness, aphasia, (she checks, diplopia, or blurred vision. He has had 2 or 3 falling episodes over the past 3 months which are unexplained. He denies chronic dizziness. Recently he had a carotid duplex exam performed at the [facility] which revealed severe left ICA stenosis with no right ICA flow reduction and he was referred for further evaluation.  Past Medical History  Diagnosis Date  . CAD (coronary artery disease)     a. CABG x 6 in 1992. b. s/p DES x 2 to SVG -> OM1/OM2 in 09/2010. c. s/p DES to mid LAD 10/2010. d. NSTEMI (off DAPT) s/p PTCA of instent renarrowing of DES in SVG-OM, Recs for lifelong DAPT   . Hyperlipidemia     intolerance to Crestor Zocor Vytorin. He tolerates Pravachol...low HDL  . GERD (gastroesophageal reflux disease)   . Rosacea   . Prostatitis   . Depression   . Benign prostatic hypertrophy   . Asthma   . Paresthesias     successful surgery by Dr. Henry Pool  . Arthritis   . Diabetes mellitus     type II-diet  . Diverticulosis   . Hx of CABG     1992  . Ejection fraction     EF 60%, echo, June, 2012  . BARRETTS ESOPHAGUS 06/02/2007  . Bladder neck obstruction 09/29/2010  . SKIN LESION 06/02/2007  . 1St degree AV block   . Sinus bradycardia     asymptomatic   . Cervical disc disease   . Subdural hematoma     Subdural hematoma secondary to falling off a ladder  April, 2014,  medical therapy,  subdural did not expand while blood levels of ddual antiplatelet therapy decreased over one week.  Episode of chest pain in the hospital,, dual antiplatelet therapy restarted approximately one week after it was held  . Carotid bruit     April, 2014  . Syncope     The patient lost  consciousness and hit his head July 09 2012, not clear yet if he had true syncope  . Carotid artery disease     Doppler, May, 2014, 0-39% R. ICA, 80-99% LICA, with recent syncope patient sent for consult with vascular surgery  . Fall at home July 09, 2012  . Concussion July 09, 2012    Scalp    History  Substance Use Topics  . Smoking status: Former Smoker    Start date: 01/03/1955  . Smokeless tobacco: Never Used  . Alcohol Use: Yes    Family History  Problem Relation Age of Onset  . Heart disease Father     mother  . Aneurysm    . Hyperlipidemia    . Liver cancer      grandfather  . Diabetes Sister     mother    Allergies  Allergen Reactions  . Atenolol Other (See Comments)    depression  . Codeine Other (See Comments)    Crazy feeling  . Ezetimibe-Simvastatin Other (See Comments)    Severe hip pain  . Rosuvastatin Other (See Comments)    Severe hip pain  . Simvastatin Other (See Comments)    Severe hip pain    Current outpatient prescriptions:ADVAIR DISKUS 250-50 MCG/DOSE   AEPB, 2 (two) times daily., Disp: , Rfl: ;  albuterol (PROVENTIL HFA) 108 (90 BASE) MCG/ACT inhaler, Inhale 2 puffs into the lungs every 6 (six) hours as needed. For shortness of breath or wheezing, Disp: , Rfl: ;  aspirin (ASPIRIN EC) 81 MG EC tablet, Take 81 mg by mouth daily.  , Disp: , Rfl: ;  Cholecalciferol (VITAMIN D3) 2000 UNITS TABS, Take 1 tablet by mouth daily. , Disp: , Rfl:  clopidogrel (PLAVIX) 75 MG tablet, Take 1 tablet (75 mg total) by mouth daily., Disp: 30 tablet, Rfl: 5;  cyclobenzaprine (FLEXERIL) 5 MG tablet, Take 1 tablet (5 mg total) by mouth 3 (three) times daily as needed for muscle spasms., Disp: 90 tablet, Rfl: 1;  diphenhydrAMINE (BENADRYL) 25 MG tablet, Take 25 mg by mouth daily as needed for allergies. , Disp: , Rfl:  glucose blood (ONE TOUCH ULTRA TEST) test strip, Use as instructed once daily to check blood sugar.  Diagnosis code 250.02, Disp: 100 each, Rfl: 2;   HYDROcodone-acetaminophen (NORCO/VICODIN) 5-325 MG per tablet, Take 1 tablet by mouth every 6 (six) hours as needed for pain., Disp: 40 tablet, Rfl: 0;  loratadine (CLARITIN) 10 MG tablet, Take 10 mg by mouth daily as needed for allergies. , Disp: , Rfl:  metoprolol succinate (TOPROL-XL) 25 MG 24 hr tablet, Take 12.5 mg by mouth 2 (two) times daily., Disp: , Rfl: ;  mometasone-formoterol (DULERA) 100-5 MCG/ACT AERO, Inhale 2 puffs into the lungs 2 (two) times daily., Disp: 13 g, Rfl: 11;  montelukast (SINGULAIR) 10 MG tablet, TAKE 1 TABLET BY MOUTH AT BEDTIME, Disp: 30 tablet, Rfl: 11;  niacin (NIASPAN) 1000 MG CR tablet, Take 1,000 mg by mouth at bedtime. Take with aspirin, Disp: , Rfl:  nitroGLYCERIN (NITROLINGUAL) 0.4 MG/SPRAY spray, Place 1 spray under the tongue every 5 (five) minutes as needed for chest pain. 1 spray under tongue every 5 minutes as needed for chest pain (up to 3 doses), Disp: , Rfl: ;  nitroGLYCERIN (NITROSTAT) 0.4 MG SL tablet, Place 0.4 mg under the tongue every 5 (five) minutes as needed. For chest pain, Disp: , Rfl:  ondansetron (ZOFRAN) 4 MG tablet, Take 1 tablet (4 mg total) by mouth every 6 (six) hours., Disp: 12 tablet, Rfl: 0;  pantoprazole (PROTONIX) 40 MG tablet, TAKE 1 TABLET (40 MG TOTAL) BY MOUTH DAILY., Disp: 90 tablet, Rfl: 1;  pravastatin (PRAVACHOL) 40 MG tablet, Take 40 mg by mouth every evening., Disp: , Rfl: ;  tamsulosin (FLOMAX) 0.4 MG CAPS, Take 0.4 mg by mouth daily after supper., Disp: , Rfl:  traMADol (ULTRAM) 50 MG tablet, Take 1 tablet (50 mg total) by mouth every 8 (eight) hours as needed for pain., Disp: 60 tablet, Rfl: 0  BP 112/77  Pulse 65  Resp 16  Ht 5' 11.5" (1.816 m)  Wt 209 lb (94.802 kg)  BMI 28.75 kg/m2  SpO2 97%  Body mass index is 28.75 kg/(m^2).           Review of Systems denies chest pain, dyspnea on exertion, PND, orthopnea, hemoptysis, claudication. Does have a history of DVT, and coronary artery bypass grafting in 1992  by Dr. Wilson. Patient had recurrent chest pain in 2012 and had 4 stents placed by Dr. Michael Cooper.     Objective:   Physical Exam blood pressure 112/77 heart rate 60 respirations 16 Gen.-alert and oriented x3 in no apparent distress HEENT normal for age Lungs no rhonchi or wheezing Cardiovascular regular rhythm no murmurs carotid   pulses 3+ palpable -soft bruit on the left Abdomen soft nontender no palpable masses Musculoskeletal free of  major deformities Skin clear -no rashes Neurologic normal Lower extremities 3+ femoral and dorsalis pedis pulses palpable bilaterally with no edema  Today I ordered a carotid duplex exam which are reviewed and interpreted and compared to the recent study. Patient does have a 85-90% left ICA stenosis which is in agreement with the previous study.       Assessment:     #1 severe left ICA stenosis-likely asymptomatic-2 recent falling spells #2 history of coronary artery disease-coronary artery bypass grafting 1992 with 4 stents placed 2012-currently asymptomatic Discuss situation at length with Dr. Jeff Katz who feels the patient does not need nuclear stress test preop Plan to keep patient on Plavix perioperatively. Discussed with patient increase the possibility of neck hematoma because of Plavix    Plan:     Plan left carotid endarterectomy on Wednesday, May 28-risks and benefits thoroughly discussed the patient would like to proceed      

## 2012-08-24 ENCOUNTER — Encounter: Payer: Self-pay | Admitting: Cardiology

## 2012-08-24 ENCOUNTER — Other Ambulatory Visit: Payer: Self-pay | Admitting: *Deleted

## 2012-08-24 MED ORDER — PRAVASTATIN SODIUM 40 MG PO TABS: 40.0000 mg | ORAL_TABLET | Freq: Every evening | ORAL | Status: DC

## 2012-08-24 NOTE — Progress Notes (Signed)
   I spoke with Dr. Hart Rochester of vascular surgery today. The patient has a tight carotid and will be having carotid endarterectomy in the near future. This patient has significant coronary disease. His last intervention was in September, 2013. He has not had any significant exertional chest pain. He had one episode of pain while in the hospital recovering from his subdural hematoma. There was significant anxiety at that time because his aspirin and Plavix were on hold. These meds were resumed early after his subdural and he did very well. He has not had any significant recurrent chest pain.  I feel that the patient is stable from the cardiac viewpoint. He is cleared to have endarterectomy. I feel it is most prudent not to proceed with any other cardiac testing at this time. I have asked Dr. Hart Rochester to plan to do his carotid endarterectomy on aspirin and Plavix. Dr. Hart Rochester tells me that he is able to do this. He will explain to the patient and family that this can possibly lead to some increased bleeding. Considering all factors I feel it is definitely best to do the procedure on aspirin and Plavix.  Jerral Bonito, MD

## 2012-08-28 ENCOUNTER — Encounter (HOSPITAL_COMMUNITY): Payer: Self-pay | Admitting: Pharmacy Technician

## 2012-08-30 ENCOUNTER — Other Ambulatory Visit: Payer: Self-pay

## 2012-08-31 ENCOUNTER — Encounter (HOSPITAL_COMMUNITY)
Admission: RE | Admit: 2012-08-31 | Discharge: 2012-08-31 | Disposition: A | Discharge status: Home / Self Care | Payer: Medicare Other | Source: Ambulatory Visit | Attending: Vascular Surgery | Admitting: Vascular Surgery

## 2012-08-31 ENCOUNTER — Ambulatory Visit: Payer: Medicare Other | Admitting: Physical Therapy

## 2012-08-31 NOTE — Pre-Procedure Instructions (Signed)
Sean Lozano  08/31/2012   Your procedure is scheduled on:  May 29  Report to [facility] at 05:30 AM.  Call this number if you have problems the morning of surgery: [phone]   Remember:   Do not eat food or drink liquids after midnight.   Take these medicines the morning of surgery with A SIP OF WATER: Advair, Albuterol, Benadryl (if needed), Hydrocodone (if needed), Claritin (if needed), Metoprolol, Dulera, Zofran (if needed), Protonix,    STOP Niacin, Vitamin d after today  Do not wear jewelry, make-up or nail polish.  Do not wear lotions, powders, or perfumes. You may wear deodorant.  Do not shave 48 hours prior to surgery. Men may shave face and neck.  Do not bring valuables to the hospital.  Contacts, dentures or bridgework may not be worn into surgery.  Leave suitcase in the car. After surgery it may be brought to your room.  For patients admitted to the hospital, checkout time is 11:00 AM the day of  discharge.   Special Instructions: Shower using CHG 2 nights before surgery and the night before surgery.  If you shower the day of surgery use CHG.  Use special wash - you have one bottle of CHG for all showers.  You should use approximately 1/3 of the bottle for each shower.   Please read over the following fact sheets that you were given: Pain Booklet, Coughing and Deep Breathing, Blood Transfusion Information and Surgical Site Infection Prevention

## 2012-08-31 NOTE — Progress Notes (Signed)
Called patient and left return message regarding PAT

## 2012-09-01 ENCOUNTER — Encounter: Payer: Self-pay | Admitting: Internal Medicine

## 2012-09-01 ENCOUNTER — Other Ambulatory Visit: Payer: Self-pay | Admitting: *Deleted

## 2012-09-01 MED ORDER — CLOPIDOGREL BISULFATE 75 MG PO TABS: 75.0000 mg | ORAL_TABLET | Freq: Every day | ORAL | Status: DC

## 2012-09-01 NOTE — Telephone Encounter (Signed)
Sean Lozano to contact pt - needs to re-schedule

## 2012-09-01 NOTE — Progress Notes (Addendum)
Anesthesia Chart Review:  Patient is a 79 year old male scheduled for left CEA by Dr. Hart Rochester on 09/07/12.  Recent carotid duplex showed 80-99% LICAS and 0-39% RICAS. PAT appointment is scheduled for 09/05/12.  Other history includes SDH 07/09/12 following a fall with LOC, CAD s/p CABG '92 with NSTEMI 07/13/05 s/p DES LAD graft, DES OM graft 10/12/10 and DES LAD 11/02/10, NSTEMI 12/21/11 s/p PCI for instent re-stenosis of previously placed DES to SVG to the OM, HLD, GERD, Barrett's esophagus, rosacea, BPH with prostatits, diverticulosis, arthritis, former smoker. PCP is Dr. Oliver Barre.  Cardiologist is Dr. Myrtis Ser.  He has cleared patient for this procedure without additional cardiac testing, but did request that procedure be done while patient is on both Plavix and ASA.   In regards to his SDH, he was evaluated by neurosurgeon Dr. Jordan Likes who did not recommend surgery.  He did ask that patient remain off antiplatelet therapy for 2 weeks and PRN neurosurgery follow-up.  However, patient was only off his anti-platelet therapy for less than a week because he developed chest pains when off Plavix/ASA.  Patient is now back on dual anti-platelet therapy with follow-up head CT on 07/22/12 showing "interval expected evolution of small right para falcine subdural hematoma: mildly decreased in size and less dense/distinct."  EKG on 07/22/12 showed SR, first degree AVB, LAD.  He is currently wearing a Holter Monitor with plans to remove it just prior to surgery.  Cardiac cath on 12/21/2011 showed: Left mainstem: As on the prior study (11/02/10), there is disease of the LMCA leading into the AV circ. There are tandem lesions of 60, 70, and 90 leading to the av circ. The AV circ shows a small branch and the OMs are occluded. Left anterior descending (LAD): totally occluded proximally. Left circumflex (LCx): see above. Right coronary artery (RCA): total occlusion proximally. SVG to the diagonal is occluded. SVG to the distal RCA is  occluded. The SVG to the two OM branches has been stented in three locations. The ostial stent has 30% narrowing. The mid stent has 95% thrombotic in stent restenosis. The distal stent to the second branch is widely patent. The LIMA to the LAD is widely patent. It inserts to the diagonal LAD interface and fills both antegrade. There is perhaps 30% narrowing just beyond the insertion. The stent site is widely patent and the distal LAD fills the PDA in a retrograde manner. It also retrograde fills an RV branch. Left ventriculography: Left ventricular systolic function is normal, LVEF is estimated at 55-65%, there is no significant mitral regurgitation.   Echo on 10/08/10 showed: - Left ventricle: The cavity size was normal. Wall thickness was normal. Systolic function was normal. The estimated ejection fraction was in the range of 60% to 65%. Wall motion was normal; there were no regional wall motion abnormalities. Doppler parameters are consistent with abnormal left ventricular relaxation (grade 1 diastolic dysfunction). - Left atrium: The atrium was mildly dilated. - Atrial septum: No defect or patent foramen ovale was identified. - Tricuspid valve: Trivial regurgitation.  2V CXR on 12/20/11 showed no active lung disease. Mild linear atelectasis and/or scarring at the left lung base.   Labs are pending his PAT evaluation.  I did review currently available information with anesthesiologist Dr. Krista Blue.  No additional recommendations if patient does not have any new CV or neurological changes.  Velna Ochs [facility]/Anesthesiology Phone [phone] 09/01/2012 5:21 PM  Addendum: 09/05/12 I spoke with patient during  his PAT evaluation. He denies any chest pain or other new CV symptoms.  He denies any significant headaches or visual changes.  He was experiencing vertigo after his fall in March 2014, but he has been undergoing treatments with PT (hall pike maneuver?) and has had  significant improvement.  VSS today.  Heart rate RRR.  Lungs clear.  No LE edema noted.  Preoperative labs noted.

## 2012-09-05 ENCOUNTER — Encounter (HOSPITAL_COMMUNITY): Payer: Self-pay

## 2012-09-05 ENCOUNTER — Encounter (HOSPITAL_COMMUNITY)
Admission: RE | Admit: 2012-09-05 | Discharge: 2012-09-05 | Disposition: A | Discharge status: Home / Self Care | Payer: Medicare Other | Source: Ambulatory Visit | Attending: Vascular Surgery | Admitting: Vascular Surgery

## 2012-09-05 LAB — CBC
HCT: 47.9 % (ref 39.0–52.0)
Hemoglobin: 16.6 g/dL (ref 13.0–17.0)
MCH: 33.5 pg (ref 26.0–34.0)
MCHC: 34.7 g/dL (ref 30.0–36.0)
MCV: 96.6 fL (ref 78.0–100.0)
Platelets: 172 10*3/uL (ref 150–400)
RBC: 4.96 MIL/uL (ref 4.22–5.81)
RDW: 13.1 % (ref 11.5–15.5)
WBC: 10 10*3/uL (ref 4.0–10.5)

## 2012-09-05 LAB — COMPREHENSIVE METABOLIC PANEL
ALT: 16 U/L (ref 0–53)
AST: 25 U/L (ref 0–37)
Albumin: 3.9 g/dL (ref 3.5–5.2)
Alkaline Phosphatase: 65 U/L (ref 39–117)
BUN: 12 mg/dL (ref 6–23)
CO2: 23 mEq/L (ref 19–32)
Calcium: 9.7 mg/dL (ref 8.4–10.5)
Chloride: 101 mEq/L (ref 96–112)
Creatinine, Ser: 1.04 mg/dL (ref 0.50–1.35)
GFR calc Af Amer: 78 mL/min — ABNORMAL LOW (ref >90)
GFR calc non Af Amer: 67 mL/min — ABNORMAL LOW (ref >90)
Glucose, Bld: 107 mg/dL — ABNORMAL HIGH (ref 70–99)
Potassium: 4.6 mEq/L (ref 3.5–5.1)
Sodium: 136 mEq/L (ref 135–145)
Total Bilirubin: 0.5 mg/dL (ref 0.3–1.2)
Total Protein: 7.6 g/dL (ref 6.0–8.3)

## 2012-09-05 LAB — URINALYSIS, ROUTINE W REFLEX MICROSCOPIC
Bilirubin Urine: NEGATIVE
Glucose, UA: NEGATIVE mg/dL
Hgb urine dipstick: NEGATIVE
Ketones, ur: NEGATIVE mg/dL
Leukocytes, UA: NEGATIVE
Nitrite: NEGATIVE
Protein, ur: NEGATIVE mg/dL
Specific Gravity, Urine: 1.014 (ref 1.005–1.030)
Urobilinogen, UA: 0.2 mg/dL (ref 0.0–1.0)
pH: 7 (ref 5.0–8.0)

## 2012-09-05 LAB — PREPARE RBC (CROSSMATCH)

## 2012-09-05 LAB — SURGICAL PCR SCREEN
MRSA, PCR: NEGATIVE
Staphylococcus aureus: POSITIVE — AB

## 2012-09-05 LAB — PROTIME-INR
INR: 1.01 (ref 0.00–1.49)
Prothrombin Time: 13.2 seconds (ref 11.6–15.2)

## 2012-09-05 LAB — APTT: aPTT: 37 seconds (ref 24–37)

## 2012-09-06 ENCOUNTER — Ambulatory Visit: Payer: Medicare Other | Admitting: Physical Therapy

## 2012-09-06 MED ORDER — DEXTROSE 5 % IV SOLN
1.5000 g | INTRAVENOUS | Status: AC
  Administered 2012-09-07: 1.5 g via INTRAVENOUS
  Filled 2012-09-06: qty 1.5

## 2012-09-07 ENCOUNTER — Encounter (HOSPITAL_COMMUNITY)
Admission: RE | Disposition: A | Discharge status: Home / Self Care | Payer: Self-pay | Source: Ambulatory Visit | Attending: Vascular Surgery

## 2012-09-07 ENCOUNTER — Encounter (HOSPITAL_COMMUNITY): Payer: Self-pay | Admitting: *Deleted

## 2012-09-07 ENCOUNTER — Encounter (HOSPITAL_COMMUNITY): Payer: Self-pay | Admitting: Vascular Surgery

## 2012-09-07 ENCOUNTER — Inpatient Hospital Stay (HOSPITAL_COMMUNITY)
Admission: RE | Admit: 2012-09-07 | Discharge: 2012-09-08 | DRG: 039 | Disposition: A | Discharge status: Home / Self Care | Payer: Medicare Other | Source: Ambulatory Visit | Attending: Vascular Surgery | Admitting: Vascular Surgery

## 2012-09-07 ENCOUNTER — Telehealth: Payer: Self-pay | Admitting: Vascular Surgery

## 2012-09-07 ENCOUNTER — Inpatient Hospital Stay (HOSPITAL_COMMUNITY): Payer: Medicare Other | Admitting: Anesthesiology

## 2012-09-07 DIAGNOSIS — K5730 Diverticulosis of large intestine without perforation or abscess without bleeding: Secondary | ICD-10-CM | POA: Diagnosis present

## 2012-09-07 DIAGNOSIS — I6529 Occlusion and stenosis of unspecified carotid artery: Secondary | ICD-10-CM

## 2012-09-07 DIAGNOSIS — F3289 Other specified depressive episodes: Secondary | ICD-10-CM | POA: Diagnosis present

## 2012-09-07 DIAGNOSIS — M5090 Cervical disc disorder, unspecified, unspecified cervical region: Secondary | ICD-10-CM | POA: Diagnosis present

## 2012-09-07 DIAGNOSIS — E785 Hyperlipidemia, unspecified: Secondary | ICD-10-CM | POA: Diagnosis present

## 2012-09-07 DIAGNOSIS — I2510 Atherosclerotic heart disease of native coronary artery without angina pectoris: Secondary | ICD-10-CM | POA: Diagnosis present

## 2012-09-07 DIAGNOSIS — I252 Old myocardial infarction: Secondary | ICD-10-CM

## 2012-09-07 DIAGNOSIS — N400 Enlarged prostate without lower urinary tract symptoms: Secondary | ICD-10-CM | POA: Diagnosis present

## 2012-09-07 DIAGNOSIS — K219 Gastro-esophageal reflux disease without esophagitis: Secondary | ICD-10-CM | POA: Diagnosis present

## 2012-09-07 DIAGNOSIS — E119 Type 2 diabetes mellitus without complications: Secondary | ICD-10-CM | POA: Diagnosis present

## 2012-09-07 DIAGNOSIS — Z951 Presence of aortocoronary bypass graft: Secondary | ICD-10-CM

## 2012-09-07 DIAGNOSIS — I440 Atrioventricular block, first degree: Secondary | ICD-10-CM | POA: Diagnosis present

## 2012-09-07 DIAGNOSIS — Z9861 Coronary angioplasty status: Secondary | ICD-10-CM

## 2012-09-07 DIAGNOSIS — I9589 Other hypotension: Secondary | ICD-10-CM | POA: Diagnosis not present

## 2012-09-07 DIAGNOSIS — Z87891 Personal history of nicotine dependence: Secondary | ICD-10-CM

## 2012-09-07 DIAGNOSIS — F329 Major depressive disorder, single episode, unspecified: Secondary | ICD-10-CM | POA: Diagnosis present

## 2012-09-07 DIAGNOSIS — K2270 Barrett's esophagus without dysplasia: Secondary | ICD-10-CM | POA: Diagnosis present

## 2012-09-07 DIAGNOSIS — M129 Arthropathy, unspecified: Secondary | ICD-10-CM | POA: Diagnosis present

## 2012-09-07 DIAGNOSIS — I779 Disorder of arteries and arterioles, unspecified: Secondary | ICD-10-CM | POA: Diagnosis present

## 2012-09-07 DIAGNOSIS — Z86718 Personal history of other venous thrombosis and embolism: Secondary | ICD-10-CM

## 2012-09-07 DIAGNOSIS — J45909 Unspecified asthma, uncomplicated: Secondary | ICD-10-CM | POA: Diagnosis present

## 2012-09-07 HISTORY — PX: ENDARTERECTOMY: SHX5162

## 2012-09-07 LAB — GLUCOSE, CAPILLARY
Glucose-Capillary: 117 mg/dL — ABNORMAL HIGH (ref 70–99)
Glucose-Capillary: 127 mg/dL — ABNORMAL HIGH (ref 70–99)
Glucose-Capillary: 136 mg/dL — ABNORMAL HIGH (ref 70–99)
Glucose-Capillary: 116 mg/dL — ABNORMAL HIGH (ref 70–99)

## 2012-09-07 SURGERY — ENDARTERECTOMY, CAROTID
Anesthesia: General | Site: Neck | Laterality: Left

## 2012-09-07 MED ORDER — ONDANSETRON HCL 4 MG/2ML IJ SOLN
INTRAMUSCULAR | Status: DC | PRN
  Administered 2012-09-07: 4 mg via INTRAVENOUS

## 2012-09-07 MED ORDER — MONTELUKAST SODIUM 10 MG PO TABS
10.0000 mg | ORAL_TABLET | Freq: Every day | ORAL | Status: DC
  Administered 2012-09-07: 10 mg via ORAL
  Filled 2012-09-07 (×2): qty 1

## 2012-09-07 MED ORDER — GLYCOPYRROLATE 0.2 MG/ML IJ SOLN
INTRAMUSCULAR | Status: DC | PRN
  Administered 2012-09-07: 0.6 mg via INTRAVENOUS
  Administered 2012-09-07: 0.2 mg via INTRAVENOUS

## 2012-09-07 MED ORDER — CLOPIDOGREL BISULFATE 75 MG PO TABS
75.0000 mg | ORAL_TABLET | Freq: Every day | ORAL | Status: DC
  Administered 2012-09-08: 75 mg via ORAL
  Filled 2012-09-07: qty 1

## 2012-09-07 MED ORDER — ACETAMINOPHEN 325 MG PO TABS: 325.0000 mg | ORAL_TABLET | ORAL | Status: DC | PRN

## 2012-09-07 MED ORDER — ONDANSETRON HCL 4 MG PO TABS: 4.0000 mg | ORAL_TABLET | Freq: Four times a day (QID) | ORAL | Status: DC | PRN

## 2012-09-07 MED ORDER — ASPIRIN 81 MG PO TBEC
81.0000 mg | DELAYED_RELEASE_TABLET | Freq: Every day | ORAL | Status: DC
  Filled 2012-09-07 (×2): qty 1

## 2012-09-07 MED ORDER — SODIUM CHLORIDE 0.9 % IV BOLUS (SEPSIS)
500.0000 mL | Freq: Once | INTRAVENOUS | Status: AC
  Administered 2012-09-07: 500 mL via INTRAVENOUS

## 2012-09-07 MED ORDER — HYDROCODONE-ACETAMINOPHEN 5-325 MG PO TABS: 1.0000 | ORAL_TABLET | Freq: Four times a day (QID) | ORAL | Status: DC | PRN

## 2012-09-07 MED ORDER — PANTOPRAZOLE SODIUM 40 MG PO TBEC
40.0000 mg | DELAYED_RELEASE_TABLET | Freq: Every day | ORAL | Status: DC
  Administered 2012-09-08: 40 mg via ORAL
  Filled 2012-09-07: qty 1

## 2012-09-07 MED ORDER — SODIUM CHLORIDE 0.9 % IV SOLN: INTRAVENOUS | Status: DC

## 2012-09-07 MED ORDER — ALUM & MAG HYDROXIDE-SIMETH 200-200-20 MG/5ML PO SUSP: 15.0000 mL | ORAL | Status: DC | PRN

## 2012-09-07 MED ORDER — MOMETASONE FURO-FORMOTEROL FUM 100-5 MCG/ACT IN AERO
2.0000 | INHALATION_SPRAY | Freq: Two times a day (BID) | RESPIRATORY_TRACT | Status: DC
  Administered 2012-09-07 – 2012-09-08 (×2): 2 via RESPIRATORY_TRACT
  Filled 2012-09-07: qty 8.8

## 2012-09-07 MED ORDER — FENTANYL CITRATE 0.05 MG/ML IJ SOLN
INTRAMUSCULAR | Status: DC | PRN
  Administered 2012-09-07 (×2): 50 ug via INTRAVENOUS
  Administered 2012-09-07: 25 ug via INTRAVENOUS
  Administered 2012-09-07: 75 ug via INTRAVENOUS
  Administered 2012-09-07: 50 ug via INTRAVENOUS

## 2012-09-07 MED ORDER — ACETAMINOPHEN 650 MG RE SUPP: 325.0000 mg | RECTAL | Status: DC | PRN

## 2012-09-07 MED ORDER — DOCUSATE SODIUM 100 MG PO CAPS
100.0000 mg | ORAL_CAPSULE | Freq: Every day | ORAL | Status: DC
  Administered 2012-09-08: 100 mg via ORAL
  Filled 2012-09-07: qty 1

## 2012-09-07 MED ORDER — OXYCODONE HCL 5 MG PO TABS: 5.0000 mg | ORAL_TABLET | Freq: Once | ORAL | Status: DC | PRN

## 2012-09-07 MED ORDER — LIDOCAINE HCL (CARDIAC) 20 MG/ML IV SOLN
INTRAVENOUS | Status: DC | PRN
  Administered 2012-09-07: 100 mg via INTRAVENOUS

## 2012-09-07 MED ORDER — MORPHINE SULFATE 2 MG/ML IJ SOLN
2.0000 mg | INTRAMUSCULAR | Status: DC | PRN
  Administered 2012-09-07: 2 mg via INTRAVENOUS
  Filled 2012-09-07: qty 1

## 2012-09-07 MED ORDER — NITROGLYCERIN 0.4 MG SL SUBL: 0.4000 mg | SUBLINGUAL_TABLET | SUBLINGUAL | Status: DC | PRN

## 2012-09-07 MED ORDER — SODIUM CHLORIDE 0.9 % IV SOLN
INTRAVENOUS | Status: DC
  Administered 2012-09-07: 12:00:00 via INTRAVENOUS

## 2012-09-07 MED ORDER — TAMSULOSIN HCL 0.4 MG PO CAPS
0.4000 mg | ORAL_CAPSULE | Freq: Every day | ORAL | Status: DC
  Administered 2012-09-07: 0.4 mg via ORAL
  Filled 2012-09-07 (×2): qty 1

## 2012-09-07 MED ORDER — OXYCODONE HCL 5 MG/5ML PO SOLN: 5.0000 mg | Freq: Once | ORAL | Status: DC | PRN

## 2012-09-07 MED ORDER — LACTATED RINGERS IV SOLN
INTRAVENOUS | Status: DC | PRN
  Administered 2012-09-07 (×2): via INTRAVENOUS

## 2012-09-07 MED ORDER — METOPROLOL TARTRATE 1 MG/ML IV SOLN: 2.0000 mg | INTRAVENOUS | Status: DC | PRN

## 2012-09-07 MED ORDER — SODIUM CHLORIDE 0.9 % IV SOLN: 500.0000 mL | Freq: Once | INTRAVENOUS | Status: AC | PRN

## 2012-09-07 MED ORDER — DEXTROSE 5 % IV SOLN
1.5000 g | Freq: Two times a day (BID) | INTRAVENOUS | Status: AC
  Administered 2012-09-07 – 2012-09-08 (×2): 1.5 g via INTRAVENOUS
  Filled 2012-09-07 (×2): qty 1.5

## 2012-09-07 MED ORDER — ARTIFICIAL TEARS OP OINT
TOPICAL_OINTMENT | OPHTHALMIC | Status: DC | PRN
  Administered 2012-09-07: 1 via OPHTHALMIC

## 2012-09-07 MED ORDER — HEMOSTATIC AGENTS (NO CHARGE) OPTIME
TOPICAL | Status: DC | PRN
  Administered 2012-09-07: 1 via TOPICAL

## 2012-09-07 MED ORDER — CYCLOBENZAPRINE HCL 10 MG PO TABS: 5.0000 mg | ORAL_TABLET | Freq: Three times a day (TID) | ORAL | Status: DC | PRN

## 2012-09-07 MED ORDER — PHENOL 1.4 % MT LIQD: 1.0000 | OROMUCOSAL | Status: DC | PRN

## 2012-09-07 MED ORDER — NEOSTIGMINE METHYLSULFATE 1 MG/ML IJ SOLN
INTRAMUSCULAR | Status: DC | PRN
  Administered 2012-09-07: 4 mg via INTRAVENOUS

## 2012-09-07 MED ORDER — MUPIROCIN 2 % EX OINT
TOPICAL_OINTMENT | Freq: Two times a day (BID) | CUTANEOUS | Status: DC
  Administered 2012-09-07 – 2012-09-08 (×2): via NASAL
  Filled 2012-09-07: qty 22

## 2012-09-07 MED ORDER — ONDANSETRON HCL 4 MG/2ML IJ SOLN
4.0000 mg | Freq: Four times a day (QID) | INTRAMUSCULAR | Status: DC | PRN
  Administered 2012-09-07: 4 mg via INTRAVENOUS
  Filled 2012-09-07: qty 2

## 2012-09-07 MED ORDER — PANTOPRAZOLE SODIUM 40 MG PO TBEC: 40.0000 mg | DELAYED_RELEASE_TABLET | Freq: Every day | ORAL | Status: DC

## 2012-09-07 MED ORDER — INSULIN ASPART 100 UNIT/ML ~~LOC~~ SOLN
0.0000 [IU] | Freq: Three times a day (TID) | SUBCUTANEOUS | Status: DC
  Administered 2012-09-07: 2 [IU] via SUBCUTANEOUS

## 2012-09-07 MED ORDER — PHENYLEPHRINE HCL 10 MG/ML IJ SOLN
10.0000 mg | INTRAVENOUS | Status: DC | PRN
  Administered 2012-09-07: 10 ug/min via INTRAVENOUS

## 2012-09-07 MED ORDER — ALBUTEROL SULFATE HFA 108 (90 BASE) MCG/ACT IN AERS
2.0000 | INHALATION_SPRAY | Freq: Four times a day (QID) | RESPIRATORY_TRACT | Status: DC | PRN
Start: 2012-09-07 — End: 2012-09-08
  Filled 2012-09-07: qty 6.7

## 2012-09-07 MED ORDER — NALOXONE HCL 0.4 MG/ML IJ SOLN: 0.4000 mg | Freq: Once | INTRAMUSCULAR | Status: DC

## 2012-09-07 MED ORDER — METOPROLOL SUCCINATE 12.5 MG HALF TABLET
12.5000 mg | ORAL_TABLET | Freq: Two times a day (BID) | ORAL | Status: DC
  Administered 2012-09-07 – 2012-09-08 (×2): 12.5 mg via ORAL
  Filled 2012-09-07 (×3): qty 1

## 2012-09-07 MED ORDER — HYDROMORPHONE HCL PF 1 MG/ML IJ SOLN: 0.2500 mg | INTRAMUSCULAR | Status: DC | PRN

## 2012-09-07 MED ORDER — PROTAMINE SULFATE 10 MG/ML IV SOLN
INTRAVENOUS | Status: DC | PRN
  Administered 2012-09-07: 50 mg via INTRAVENOUS

## 2012-09-07 MED ORDER — 0.9 % SODIUM CHLORIDE (POUR BTL) OPTIME
TOPICAL | Status: DC | PRN
  Administered 2012-09-07: 2000 mL

## 2012-09-07 MED ORDER — NALOXONE HCL 0.4 MG/ML IJ SOLN
INTRAMUSCULAR | Status: AC
  Filled 2012-09-07: qty 1

## 2012-09-07 MED ORDER — ROCURONIUM BROMIDE 100 MG/10ML IV SOLN
INTRAVENOUS | Status: DC | PRN
  Administered 2012-09-07: 50 mg via INTRAVENOUS

## 2012-09-07 MED ORDER — HYDRALAZINE HCL 20 MG/ML IJ SOLN: 10.0000 mg | INTRAMUSCULAR | Status: DC | PRN

## 2012-09-07 MED ORDER — GUAIFENESIN-DM 100-10 MG/5ML PO SYRP: 15.0000 mL | ORAL_SOLUTION | ORAL | Status: DC | PRN

## 2012-09-07 MED ORDER — SODIUM CHLORIDE 0.9 % IR SOLN
Status: DC | PRN
  Administered 2012-09-07: 08:00:00

## 2012-09-07 MED ORDER — PROMETHAZINE HCL 25 MG/ML IJ SOLN: 6.2500 mg | INTRAMUSCULAR | Status: DC | PRN

## 2012-09-07 MED ORDER — POTASSIUM CHLORIDE CRYS ER 20 MEQ PO TBCR: 20.0000 meq | EXTENDED_RELEASE_TABLET | Freq: Once | ORAL | Status: AC | PRN

## 2012-09-07 MED ORDER — HEPARIN SODIUM (PORCINE) 1000 UNIT/ML IJ SOLN
INTRAMUSCULAR | Status: DC | PRN
  Administered 2012-09-07: 6000 [IU] via INTRAVENOUS

## 2012-09-07 MED ORDER — LIDOCAINE HCL (PF) 1 % IJ SOLN
INTRAMUSCULAR | Status: AC
  Filled 2012-09-07: qty 30

## 2012-09-07 MED ORDER — PROPOFOL 10 MG/ML IV BOLUS
INTRAVENOUS | Status: DC | PRN
  Administered 2012-09-07: 150 mg via INTRAVENOUS

## 2012-09-07 SURGICAL SUPPLY — 53 items
CANISTER SUCTION 2500CC (MISCELLANEOUS) ×2 IMPLANT
CATH ROBINSON RED A/P 18FR (CATHETERS) ×2 IMPLANT
CATH SUCT 10FR WHISTLE TIP (CATHETERS) ×2 IMPLANT
CLIP TI MEDIUM 24 (CLIP) ×2 IMPLANT
CLIP TI WIDE RED SMALL 24 (CLIP) ×2 IMPLANT
CLOTH BEACON ORANGE TIMEOUT ST (SAFETY) ×2 IMPLANT
COVER SURGICAL LIGHT HANDLE (MISCELLANEOUS) ×2 IMPLANT
CRADLE DONUT ADULT HEAD (MISCELLANEOUS) ×2 IMPLANT
DECANTER SPIKE VIAL GLASS SM (MISCELLANEOUS) IMPLANT
DRAIN HEMOVAC 1/8 X 5 (WOUND CARE) ×1 IMPLANT
DRAPE WARM FLUID 44X44 (DRAPE) ×2 IMPLANT
DRSG COVADERM 4X8 (GAUZE/BANDAGES/DRESSINGS) ×1 IMPLANT
ELECT REM PT RETURN 9FT ADLT (ELECTROSURGICAL) ×2
ELECTRODE REM PT RTRN 9FT ADLT (ELECTROSURGICAL) ×1 IMPLANT
EVACUATOR SILICONE 100CC (DRAIN) ×1 IMPLANT
GLOVE BIO SURGEON STRL SZ7 (GLOVE) ×3 IMPLANT
GLOVE BIOGEL PI IND STRL 6.5 (GLOVE) IMPLANT
GLOVE BIOGEL PI IND STRL 7.0 (GLOVE) IMPLANT
GLOVE BIOGEL PI IND STRL 7.5 (GLOVE) IMPLANT
GLOVE BIOGEL PI INDICATOR 6.5 (GLOVE) ×1
GLOVE BIOGEL PI INDICATOR 7.0 (GLOVE) ×1
GLOVE BIOGEL PI INDICATOR 7.5 (GLOVE) ×2
GLOVE SS BIOGEL STRL SZ 6.5 (GLOVE) IMPLANT
GLOVE SS BIOGEL STRL SZ 7 (GLOVE) ×1 IMPLANT
GLOVE SUPERSENSE BIOGEL SZ 6.5 (GLOVE) ×1
GLOVE SUPERSENSE BIOGEL SZ 7 (GLOVE) ×2
GOWN STRL NON-REIN LRG LVL3 (GOWN DISPOSABLE) ×6 IMPLANT
INSERT FOGARTY SM (MISCELLANEOUS) ×2 IMPLANT
KIT BASIN OR (CUSTOM PROCEDURE TRAY) ×2 IMPLANT
KIT ROOM TURNOVER OR (KITS) ×2 IMPLANT
NEEDLE 22X1 1/2 (OR ONLY) (NEEDLE) IMPLANT
NS IRRIG 1000ML POUR BTL (IV SOLUTION) ×4 IMPLANT
PACK CAROTID (CUSTOM PROCEDURE TRAY) ×2 IMPLANT
PAD ARMBOARD 7.5X6 YLW CONV (MISCELLANEOUS) ×4 IMPLANT
PATCH HEMASHIELD 8X75 (Vascular Products) ×1 IMPLANT
SHUNT CAROTID BYPASS 12FRX15.5 (VASCULAR PRODUCTS) IMPLANT
SPONGE LAP 18X18 X RAY DECT (DISPOSABLE) ×1 IMPLANT
SUT ETHILON 3 0 PS 1 (SUTURE) ×1 IMPLANT
SUT PROLENE 6 0 BV (SUTURE) ×4 IMPLANT
SUT PROLENE 6 0 CC (SUTURE) ×2 IMPLANT
SUT PROLENE 7 0 BV1 MDA (SUTURE) ×1 IMPLANT
SUT SILK 2 0 FS (SUTURE) ×2 IMPLANT
SUT SILK 3 0 TIES 17X18 (SUTURE)
SUT SILK 3-0 18XBRD TIE BLK (SUTURE) IMPLANT
SUT VIC AB 2-0 CT1 27 (SUTURE) ×2
SUT VIC AB 2-0 CT1 TAPERPNT 27 (SUTURE) ×1 IMPLANT
SUT VIC AB 3-0 X1 27 (SUTURE) ×2 IMPLANT
SYR CONTROL 10ML LL (SYRINGE) IMPLANT
TAPE STRIPS DRAPE STRL (GAUZE/BANDAGES/DRESSINGS) ×1 IMPLANT
TOWEL OR 17X24 6PK STRL BLUE (TOWEL DISPOSABLE) ×2 IMPLANT
TOWEL OR 17X26 10 PK STRL BLUE (TOWEL DISPOSABLE) ×2 IMPLANT
VITASURE W/FLOW CONTROL (MISCELLANEOUS) ×1 IMPLANT
WATER STERILE IRR 1000ML POUR (IV SOLUTION) ×2 IMPLANT

## 2012-09-07 NOTE — Telephone Encounter (Addendum)
Message copied by Rosalyn Charters on Thu Sep 07, 2012 12:26 PM ------      Message from: Marlowe Shores      Created: Thu Sep 07, 2012  9:12 AM       2 week CEA F/U Hart Rochester ------  notifed patient of fu appt. with dr. Hart Rochester on 09-19-12 8:30

## 2012-09-07 NOTE — Anesthesia Postprocedure Evaluation (Signed)
Anesthesia Post Note  Patient: Sean Lozano  Procedure(s) Performed: Procedure(s) (LRB): ENDARTERECTOMY CAROTID with patch angioplasty (Left)  Anesthesia type: general  Patient location: PACU  Post pain: Pain level controlled  Post assessment: Patient's Cardiovascular Status Stable  Last Vitals:  Filed Vitals:   09/07/12 1134  BP:   Pulse: 56  Temp:   Resp: 12    Post vital signs: Reviewed and stable  Level of consciousness: sedated  Complications: No apparent anesthesia complications

## 2012-09-07 NOTE — Preoperative (Signed)
Beta Blockers   Reason not to administer Beta Blockers:Not Applicable 

## 2012-09-07 NOTE — Progress Notes (Signed)
UR complete.  Orvill Coulthard RN, MSN 

## 2012-09-07 NOTE — Discharge Summary (Signed)
[facility] Discharge Summary   Patient ID:  Sean Lozano MRN: [id number] DOB/AGE: 1933/12/04 79 y.o.  Admit date: 09/07/2012 Discharge date: 09/07/2012 Date of Surgery: 09/07/2012 Surgeon: Surgeon(s): Pryor Ochoa, MD Fransisco Hertz, MD  Admission Diagnosis: Severe Left Internal Carotid Artery Stenosis   Discharge Diagnoses:  Severe Left Internal Carotid Artery Stenosis   Secondary Diagnoses: Past Medical History  Diagnosis Date  . CAD (coronary artery disease)     a. CABG x 6 in 1992. b. s/p DES x 2 to SVG -> OM1/OM2 in 09/2010. c. s/p DES to mid LAD 10/2010. d. NSTEMI (off DAPT) s/p PTCA of instent renarrowing of DES in SVG-OM, Recs for lifelong DAPT   . Hyperlipidemia     intolerance to Crestor Zocor Vytorin. He tolerates Pravachol...low HDL  . GERD (gastroesophageal reflux disease)   . Rosacea   . Prostatitis   . Depression   . Benign prostatic hypertrophy   . Asthma   . Paresthesias     successful surgery by Dr. Julio Sicks  . Arthritis   . Diabetes mellitus     type II-diet  . Diverticulosis   . Hx of CABG     1992  . Ejection fraction     EF 60%, echo, June, 2012  . BARRETTS ESOPHAGUS 06/02/2007  . Bladder neck obstruction 09/29/2010  . SKIN LESION 06/02/2007  . 1St degree AV block   . Sinus bradycardia     asymptomatic   . Cervical disc disease   . Subdural hematoma     Subdural hematoma secondary to falling off a ladder  April, 2014,  medical therapy,  subdural did not expand while blood levels of ddual antiplatelet therapy decreased over one week.  Episode of chest pain in the hospital,, dual antiplatelet therapy restarted approximately one week after it was held  . Carotid bruit     April, 2014  . Syncope     The patient lost consciousness and hit his head July 09 2012, not clear yet if he had true syncope  . Carotid artery disease     Doppler, May, 2014, 0-39% R. ICA, 80-99% LICA, with recent syncope patient sent for consult with  vascular surgery  . Fall at home July 09, 2012  . Concussion July 09, 2012    Scalp    Procedure(s): Left ENDARTERECTOMY CAROTID with patch angioplasty  Discharged Condition: good  HPI:  80 year old male referred by Dr. Jerral Bonito for evaluation of left carotid stenosis. This patient denies any history of stroke or specific hemispheric TIAs including lateralizing weakness, aphasia, (she checks, diplopia, or blurred vision. He has had 2 or 3 falling episodes over the past 3 months which are unexplained. He denies chronic dizziness. Recently he had a carotid duplex exam performed at the [facility] which revealed severe left ICA stenosis with no right ICA flow reduction and he was referred for further evaluation. Doppler, May, 2014, 0-39% R. ICA, 80-99% LICA, with recent syncope. Pt was admittted fior Left CEA     Hospital Course:  ANTWIAN Lozano is a 79 y.o. male is S/P Left Procedure(s): ENDARTERECTOMY CAROTID with patch angioplasty Extubated: POD # 0 Physical exam: Neuro exam WNL Speech fluent Good and equal strength BUE/BLE Post-op wounds healing well Pt. Ambulating, voiding and taking PO diet without difficulty. Pt pain controlled with PO pain meds. Labs as below Complications: one episode post-op hypotension - poss vaso vagal as resolved spontaneously vs Morphine IV NS Bolus  given also No further episodes overnight  Consults:     Significant Diagnostic Studies: CBC Lab Results  Component Value Date   WBC 10.0 09/05/2012   HGB 16.6 09/05/2012   HCT 47.9 09/05/2012   MCV 96.6 09/05/2012   PLT 172 09/05/2012    BMET    Component Value Date/Time   NA 136 09/05/2012 1000   K 4.6 09/05/2012 1000   CL 101 09/05/2012 1000   CO2 23 09/05/2012 1000   GLUCOSE 107* 09/05/2012 1000   BUN 12 09/05/2012 1000   CREATININE 1.04 09/05/2012 1000   CALCIUM 9.7 09/05/2012 1000   GFRNONAA 67* 09/05/2012 1000   GFRAA 78* 09/05/2012 1000   COAG Lab Results  Component Value Date    INR 1.01 09/05/2012   INR 1.01 07/22/2012   INR 1.05 07/09/2012     Disposition:  Discharge to :Home Discharge Orders   Future Appointments Provider Department Dept Phone   09/19/2012 8:30 AM Pryor Ochoa, MD [facility] [phone]   10/04/2012 11:00 AM Luis Abed, MD [facility] Main Office [place]) [phone]   10/24/2012 9:00 AM Corwin Levins, MD [facility] [phone]   Future Orders Complete By Expires     CAROTID Sugery: Call MD for difficulty swallowing or speaking; weakness in arms or legs that is a new symtom; severe headache.  If you have increased swelling in the neck and/or  are having difficulty breathing, CALL 911  As directed     Call MD for:  redness, tenderness, or signs of infection (pain, swelling, bleeding, redness, odor or green/yellow discharge around incision site)  As directed     Call MD for:  severe or increased pain, loss or decreased feeling  in affected limb(s)  As directed     Call MD for:  temperature >100.5  As directed     Driving Restrictions  As directed     Comments:      No driving for 2 weeks    Increase activity slowly  As directed     Comments:      Walk with assistance use walker or cane as needed    Lifting restrictions  As directed     Comments:      No lifting for 4 weeks    May shower   As directed     Scheduling Instructions:      Saturday    No dressing needed  As directed     Resume previous diet  As directed     may wash over wound with mild soap and water  As directed         Medication List    STOP taking these medications       traMADol 50 MG tablet  Commonly known as:  ULTRAM      TAKE these medications       ADVAIR DISKUS 250-50 MCG/DOSE Aepb  Generic drug:  Fluticasone-Salmeterol  Inhale 1 puff into the lungs 2 (two) times daily.     aspirin EC 81 MG EC tablet  Generic drug:  aspirin  Take 81 mg by mouth daily.     clopidogrel 75 MG tablet   Commonly known as:  PLAVIX  Take 1 tablet (75 mg total) by mouth daily.     cyclobenzaprine 5 MG tablet  Commonly known as:  FLEXERIL  Take 5 mg by mouth 3 (three) times daily as needed for muscle spasms.  diphenhydrAMINE 25 MG tablet  Commonly known as:  BENADRYL  Take 25 mg by mouth daily as needed for allergies.     glucose blood test strip  Commonly known as:  ONE TOUCH ULTRA TEST  Use as instructed once daily to check blood sugar.  Diagnosis code 250.02     HYDROcodone-acetaminophen 5-325 MG per tablet  Commonly known as:  NORCO/VICODIN  Take 1 tablet by mouth every 6 (six) hours as needed for pain.     loratadine 10 MG tablet  Commonly known as:  CLARITIN  Take 10 mg by mouth daily as needed for allergies.     metoprolol succinate 25 MG 24 hr tablet  Commonly known as:  TOPROL-XL  Take 12.5 mg by mouth 2 (two) times daily.     mometasone-formoterol 100-5 MCG/ACT Aero  Commonly known as:  DULERA  Inhale 2 puffs into the lungs 2 (two) times daily.     montelukast 10 MG tablet  Commonly known as:  SINGULAIR  Take 10 mg by mouth at bedtime.     niacin 1000 MG CR tablet  Commonly known as:  NIASPAN  Take 1,000 mg by mouth at bedtime. Take with aspirin     nitroGLYCERIN 0.4 MG SL tablet  Commonly known as:  NITROSTAT  Place 0.4 mg under the tongue every 5 (five) minutes as needed for chest pain.     nitroGLYCERIN 0.4 MG/SPRAY spray  Commonly known as:  NITROLINGUAL  Place 1 spray under the tongue every 5 (five) minutes as needed for chest pain.     ondansetron 4 MG tablet  Commonly known as:  ZOFRAN  Take 4 mg by mouth every 6 (six) hours as needed for nausea.     pantoprazole 40 MG tablet  Commonly known as:  PROTONIX  Take 40 mg by mouth daily.     pravastatin 40 MG tablet  Commonly known as:  PRAVACHOL  Take 1 tablet (40 mg total) by mouth every evening.     PROVENTIL HFA 108 (90 BASE) MCG/ACT inhaler  Generic drug:  albuterol  Inhale 2 puffs into  the lungs every 6 (six) hours as needed for wheezing or shortness of breath.     tamsulosin 0.4 MG Caps  Commonly known as:  FLOMAX  Take 0.4 mg by mouth daily after supper.     Vitamin D3 2000 UNITS Tabs  Take 1 tablet by mouth daily.       Verbal and written Discharge instructions given to the patient. Wound care per Discharge AVS     Follow-up Information   Follow up with [name], MD In 2 weeks. (office will arrange-sent)    Contact information:   [address] [place] [address] [phone]       Signed: Marlowe Shores 09/07/2012, 1:56 PM   Agree with above DC home and return to office in 2 weeks

## 2012-09-07 NOTE — H&P (View-Only) (Signed)
Subjective:     Patient ID: Sean Lozano, male   DOB: Aug 27, 1933, 80 y.o.   MRN: [id number]  HPI this 79 year old male referred by Dr. Jerral Bonito for evaluation of left carotid stenosis. This patient denies any history of stroke or specific hemispheric TIAs including lateralizing weakness, aphasia, (she checks, diplopia, or blurred vision. He has had 2 or 3 falling episodes over the past 3 months which are unexplained. He denies chronic dizziness. Recently he had a carotid duplex exam performed at the [facility] heart care which revealed severe left ICA stenosis with no right ICA flow reduction and he was referred for further evaluation.  Past Medical History  Diagnosis Date  . CAD (coronary artery disease)     a. CABG x 6 in 1992. b. s/p DES x 2 to SVG -> OM1/OM2 in 09/2010. c. s/p DES to mid LAD 10/2010. d. NSTEMI (off DAPT) s/p PTCA of instent renarrowing of DES in SVG-OM, Recs for lifelong DAPT   . Hyperlipidemia     intolerance to Crestor Zocor Vytorin. He tolerates Pravachol...low HDL  . GERD (gastroesophageal reflux disease)   . Rosacea   . Prostatitis   . Depression   . Benign prostatic hypertrophy   . Asthma   . Paresthesias     successful surgery by Dr. Julio Sicks  . Arthritis   . Diabetes mellitus     type II-diet  . Diverticulosis   . Hx of CABG     1992  . Ejection fraction     EF 60%, echo, June, 2012  . BARRETTS ESOPHAGUS 06/02/2007  . Bladder neck obstruction 09/29/2010  . SKIN LESION 06/02/2007  . 1St degree AV block   . Sinus bradycardia     asymptomatic   . Cervical disc disease   . Subdural hematoma     Subdural hematoma secondary to falling off a ladder  April, 2014,  medical therapy,  subdural did not expand while blood levels of ddual antiplatelet therapy decreased over one week.  Episode of chest pain in the hospital,, dual antiplatelet therapy restarted approximately one week after it was held  . Carotid bruit     April, 2014  . Syncope     The patient lost  consciousness and hit his head July 09 2012, not clear yet if he had true syncope  . Carotid artery disease     Doppler, May, 2014, 0-39% R. ICA, 80-99% LICA, with recent syncope patient sent for consult with vascular surgery  . Fall at home July 09, 2012  . Concussion July 09, 2012    Scalp    History  Substance Use Topics  . Smoking status: Former Smoker    Start date: 01/03/1955  . Smokeless tobacco: Never Used  . Alcohol Use: Yes    Family History  Problem Relation Age of Onset  . Heart disease Father     mother  . Aneurysm    . Hyperlipidemia    . Liver cancer      grandfather  . Diabetes Sister     mother    Allergies  Allergen Reactions  . Atenolol Other (See Comments)    depression  . Codeine Other (See Comments)    Crazy feeling  . Ezetimibe-Simvastatin Other (See Comments)    Severe hip pain  . Rosuvastatin Other (See Comments)    Severe hip pain  . Simvastatin Other (See Comments)    Severe hip pain    Current outpatient prescriptions:ADVAIR DISKUS 250-50 MCG/DOSE  AEPB, 2 (two) times daily., Disp: , Rfl: ;  albuterol (PROVENTIL HFA) 108 (90 BASE) MCG/ACT inhaler, Inhale 2 puffs into the lungs every 6 (six) hours as needed. For shortness of breath or wheezing, Disp: , Rfl: ;  aspirin (ASPIRIN EC) 81 MG EC tablet, Take 81 mg by mouth daily.  , Disp: , Rfl: ;  Cholecalciferol (VITAMIN D3) 2000 UNITS TABS, Take 1 tablet by mouth daily. , Disp: , Rfl:  clopidogrel (PLAVIX) 75 MG tablet, Take 1 tablet (75 mg total) by mouth daily., Disp: 30 tablet, Rfl: 5;  cyclobenzaprine (FLEXERIL) 5 MG tablet, Take 1 tablet (5 mg total) by mouth 3 (three) times daily as needed for muscle spasms., Disp: 90 tablet, Rfl: 1;  diphenhydrAMINE (BENADRYL) 25 MG tablet, Take 25 mg by mouth daily as needed for allergies. , Disp: , Rfl:  glucose blood (ONE TOUCH ULTRA TEST) test strip, Use as instructed once daily to check blood sugar.  Diagnosis code 250.02, Disp: 100 each, Rfl: 2;   HYDROcodone-acetaminophen (NORCO/VICODIN) 5-325 MG per tablet, Take 1 tablet by mouth every 6 (six) hours as needed for pain., Disp: 40 tablet, Rfl: 0;  loratadine (CLARITIN) 10 MG tablet, Take 10 mg by mouth daily as needed for allergies. , Disp: , Rfl:  metoprolol succinate (TOPROL-XL) 25 MG 24 hr tablet, Take 12.5 mg by mouth 2 (two) times daily., Disp: , Rfl: ;  mometasone-formoterol (DULERA) 100-5 MCG/ACT AERO, Inhale 2 puffs into the lungs 2 (two) times daily., Disp: 13 g, Rfl: 11;  montelukast (SINGULAIR) 10 MG tablet, TAKE 1 TABLET BY MOUTH AT BEDTIME, Disp: 30 tablet, Rfl: 11;  niacin (NIASPAN) 1000 MG CR tablet, Take 1,000 mg by mouth at bedtime. Take with aspirin, Disp: , Rfl:  nitroGLYCERIN (NITROLINGUAL) 0.4 MG/SPRAY spray, Place 1 spray under the tongue every 5 (five) minutes as needed for chest pain. 1 spray under tongue every 5 minutes as needed for chest pain (up to 3 doses), Disp: , Rfl: ;  nitroGLYCERIN (NITROSTAT) 0.4 MG SL tablet, Place 0.4 mg under the tongue every 5 (five) minutes as needed. For chest pain, Disp: , Rfl:  ondansetron (ZOFRAN) 4 MG tablet, Take 1 tablet (4 mg total) by mouth every 6 (six) hours., Disp: 12 tablet, Rfl: 0;  pantoprazole (PROTONIX) 40 MG tablet, TAKE 1 TABLET (40 MG TOTAL) BY MOUTH DAILY., Disp: 90 tablet, Rfl: 1;  pravastatin (PRAVACHOL) 40 MG tablet, Take 40 mg by mouth every evening., Disp: , Rfl: ;  tamsulosin (FLOMAX) 0.4 MG CAPS, Take 0.4 mg by mouth daily after supper., Disp: , Rfl:  traMADol (ULTRAM) 50 MG tablet, Take 1 tablet (50 mg total) by mouth every 8 (eight) hours as needed for pain., Disp: 60 tablet, Rfl: 0  BP 112/77  Pulse 65  Resp 16  Ht 5' 11.5" (1.816 m)  Wt 209 lb (94.802 kg)  BMI 28.75 kg/m2  SpO2 97%  Body mass index is 28.75 kg/(m^2).           Review of Systems denies chest pain, dyspnea on exertion, PND, orthopnea, hemoptysis, claudication. Does have a history of DVT, and coronary artery bypass grafting in 1992  by Dr. Andrey Campanile. Patient had recurrent chest pain in 2012 and had 4 stents placed by Dr. Tonny Bollman.     Objective:   Physical Exam blood pressure 112/77 heart rate 60 respirations 16 Gen.-alert and oriented x3 in no apparent distress HEENT normal for age Lungs no rhonchi or wheezing Cardiovascular regular rhythm no murmurs carotid  pulses 3+ palpable -soft bruit on the left Abdomen soft nontender no palpable masses Musculoskeletal free of  major deformities Skin clear -no rashes Neurologic normal Lower extremities 3+ femoral and dorsalis pedis pulses palpable bilaterally with no edema  Today I ordered a carotid duplex exam which are reviewed and interpreted and compared to the recent study. Patient does have a 85-90% left ICA stenosis which is in agreement with the previous study.       Assessment:     #1 severe left ICA stenosis-likely asymptomatic-2 recent falling spells #2 history of coronary artery disease-coronary artery bypass grafting 1992 with 4 stents placed 2012-currently asymptomatic Discuss situation at length with Dr. Jerral Bonito who feels the patient does not need nuclear stress test preop Plan to keep patient on Plavix perioperatively. Discussed with patient increase the possibility of neck hematoma because of Plavix    Plan:     Plan left carotid endarterectomy on Wednesday, May 28-risks and benefits thoroughly discussed the patient would like to proceed

## 2012-09-07 NOTE — Progress Notes (Signed)
Pt blood pressure dropped down to a cuff pressure of 57/43 and A-line pressure of 48/29 at 1409. PA at bedside during event. 500cc bolus ordered. Pt blood pressure corrected quickly. Will continue to monitor.

## 2012-09-07 NOTE — Transfer of Care (Signed)
Immediate Anesthesia Transfer of Care Note  Patient: Sean Lozano  Procedure(s) Performed: Procedure(s): ENDARTERECTOMY CAROTID with patch angioplasty (Left)  Patient Location: PACU  Anesthesia Type:General  Level of Consciousness: awake, alert , oriented and sedated  Airway & Oxygen Therapy: Patient Spontanous Breathing and Patient connected to face mask oxygen  Post-op Assessment: Report given to PACU RN, Post -op Vital signs reviewed and stable and Patient moving all extremities  Post vital signs: Reviewed and stable  Complications: No apparent anesthesia complications

## 2012-09-07 NOTE — Anesthesia Preprocedure Evaluation (Addendum)
Anesthesia Evaluation  Patient identified by MRN, date of birth, ID band Patient awake    Reviewed: Allergy & Precautions, H&P , NPO status , Patient's Chart, lab work & pertinent test results, reviewed documented beta blocker date and time   History of Anesthesia Complications Negative for: history of anesthetic complications  Airway Mallampati: II TM Distance: >3 FB Neck ROM: Full    Dental  (+) Teeth Intact, Caps and Dental Advisory Given   Pulmonary shortness of breath and with exertion, asthma ,    Pulmonary exam normal       Cardiovascular + CAD, + Peripheral Vascular Disease and +CHF  a. CABG x 6 in 1992. b. s/p DES x 2 to SVG -> OM1/OM2 in 09/2010. c. s/p DES to mid LAD 10/2010. d. NSTEMI (off DAPT) s/p PTCA of instent renarrowing of DES in SVG-OM, Recs for lifelong DAPT  EF 60%, echo, June, 2012   Neuro/Psych PSYCHIATRIC DISORDERS Depression    GI/Hepatic GERD-  Medicated,  Endo/Other  diabetes, Type 2  Renal/GU      Musculoskeletal   Abdominal   Peds  Hematology   Anesthesia Other Findings   Reproductive/Obstetrics                          Anesthesia Physical Anesthesia Plan  ASA: III  Anesthesia Plan: General   Post-op Pain Management:    Induction: Intravenous  Airway Management Planned: Oral ETT  Additional Equipment: Arterial line  Intra-op Plan:   Post-operative Plan: Extubation in OR  Informed Consent: I have reviewed the patients History and Physical, chart, labs and discussed the procedure including the risks, benefits and alternatives for the proposed anesthesia with the patient or authorized representative who has indicated his/her understanding and acceptance.   Dental advisory given  Plan Discussed with: CRNA, Anesthesiologist and Surgeon  Anesthesia Plan Comments:        Anesthesia Quick Evaluation

## 2012-09-07 NOTE — Interval H&P Note (Signed)
History and Physical Interval Note:  09/07/2012 7:35 AM  Sean Lozano  has presented today for surgery, with the diagnosis of Severe Left Internal Carotid Artery Stenosis   The various methods of treatment have been discussed with the patient and family. After consideration of risks, benefits and other options for treatment, the patient has consented to  Procedure(s): ENDARTERECTOMY CAROTID (Left) as a surgical intervention .  The patient's history has been reviewed, patient examined, no change in status, stable for surgery.  I have reviewed the patient's chart and labs.  Questions were answered to the patient's satisfaction.     [name]

## 2012-09-07 NOTE — Progress Notes (Addendum)
VASCULAR AND VEIN SURGERY POST - OP CEA PROGRESS NOTE  Date of Surgery: 09/07/2012  Surgeon(s): Pryor Ochoa, MD Fransisco Hertz, MD Day of Surgery left CEA .  HPI: Sean Lozano is a 79 y.o. male who is S/P left CEA . Patient C/O nausea. Noted SBP 53 by aline - confirmed by BP cuff. Pt just received 2mg  of morphine IV.  Patient denies headache; Patient denies difficulty swallowing; denies weakness in upper or lower extremities; Pt. denies other symptoms of stroke or TIA.  Physical Exam:  BP Readings from Last 3 Encounters:  09/07/12 103/57  09/07/12 103/57  09/05/12 137/84   Temp Readings from Last 3 Encounters:  09/07/12 97 F (36.1 C)   09/07/12 97 F (36.1 C)   09/05/12 97.7 F (36.5 C)    SpO2 Readings from Last 3 Encounters:  09/07/12 95%  09/07/12 95%  09/05/12 95%   Pulse Readings from Last 3 Encounters:  09/07/12 61  09/07/12 61  09/05/12 67    Pt is A&O x 3 Gait is guarded Speech is fluent left Neck Wound is healing well Patient with Negative tongue deviation and Negative facial droop Pt has good and equal strength in all extremities  Assessment/Plan:: Sean Lozano is a 79 y.o. male is S/P Left CEA Acute Hypotension - sec to morphine IV vs vaso vagal response to movement Will stop Morphine, Give 500cc NS Bolus BP recovering to normal spontaneously and nausea resolving. Neuro exam intact Pt has voided JP drain with minimal drainage 25cc since surgery  ROCZNIAK,REGINA J  09/07/2012 2:19 PM   agree with above Blood pressure responded to IV fluids

## 2012-09-07 NOTE — OR Nursing (Signed)
Patient displayed equal bilateral strength in hands and feet, tongue midline; pre-op and post-op. 

## 2012-09-07 NOTE — Op Note (Signed)
OPERATIVE REPORT  Date of Surgery: 09/07/2012  Surgeon: [name], MD  Assistant: Dr. Leonides Sake, Clearence Ped  Pre-op Diagnosis: Severe Left Internal Carotid Artery Stenosis -asymptomatic Post-op Diagnosis: Same with redundancy of carotid artery  Procedure: Procedure(s): #1 left carotid endarterectomy with Dacron patch angioplasty #2 resection of redundant carotid artery with primary reanastomosis  Anesthesia: Gen. endotracheal  EBL: Minimal  Complications: None Drains-one Jackson-Pratt  Procedure Details: The patient was taken to the operating room placed in the supine position at which time satisfactory general endotracheal anesthesia was administered. The left neck was prepped with Betadine scrub and solution draped in routine sterile manner. Incision was made on the anterior border of the sternocleidomastoid muscle carried down through subcutaneous tissue and platysma using the Bovie. The common facial vein and external jugular veins were ligated with 3-0 silk ties and divided. The common internal and external carotid artery were dissected free encircled with vessel loops. There was some significant redundancy in the distal common and proximal internal carotid artery which would require resection and reanastomosis to eliminate a kink. #10 shunt was prepared and the patient was heparinized. Carotid vessels were occluded with vascular clamps longitudinal opening made in the common carotid artery with 15 blade and extended with the Potts scissors. #10 shunt was inserted without difficulty reestablishing flow in about 2 minutes. A standard endarterectomy was performed using an elevator and Potts scissors with an eversion endarterectomy of the external carotid. The plaque feathered off the distal internal carotid artery nicely not requiring any tacking sutures. The plaque was causing an approximate 90-95% stenosis. It was heavily calcified. Following careful removal of all loose debris  using tacking sutures of 6-0 Prolene a 1-2 cm segment of internal carotid artery was resected with a primary anastomosis done using continuous 6-0 Prolene. When this was completed a Dacron patch was sewn into place. Prior to completion of the closure appropriate flushing was performed and removal of the shunt and thorough irrigation with heparin saline. Closure was completed with reestablishment of flow initially up the external then up the internal branch. Clot the carotid was occluded for less than 2 minutes for removal of the shunt. Protamine was given to reverse the heparin. There was excellent Doppler flow in the external and internal carotid arteries and the redundancy was eliminated no kinks. There was some significant oozing due to the patient being on Plavix. Therefore a Jackson-Pratt drain was brought out through an inferiorly based stab wound and secured with a nylon suture. Also Vitasure was used to assist in coagulation. Adequate hemostasis was achieved and the wound closed in layers with Vicryl in a subcuticular fashion. Patient was taken to the recovery room in stable condition   [name], MD 09/07/2012 10:18 AM

## 2012-09-08 ENCOUNTER — Encounter (HOSPITAL_COMMUNITY): Payer: Self-pay | Admitting: Vascular Surgery

## 2012-09-08 LAB — BASIC METABOLIC PANEL
BUN: 9 mg/dL (ref 6–23)
CO2: 24 mEq/L (ref 19–32)
Calcium: 8.8 mg/dL (ref 8.4–10.5)
Chloride: 104 mEq/L (ref 96–112)
Creatinine, Ser: 0.92 mg/dL (ref 0.50–1.35)
GFR calc Af Amer: >90 mL/min (ref >90)
GFR calc non Af Amer: 79 mL/min — ABNORMAL LOW (ref >90)
Glucose, Bld: 115 mg/dL — ABNORMAL HIGH (ref 70–99)
Potassium: 3.8 mEq/L (ref 3.5–5.1)
Sodium: 137 mEq/L (ref 135–145)

## 2012-09-08 LAB — GLUCOSE, CAPILLARY: Glucose-Capillary: 143 mg/dL — ABNORMAL HIGH (ref 70–99)

## 2012-09-08 LAB — TYPE AND SCREEN
ABO/RH(D): B POS
Antibody Screen: NEGATIVE
Unit division: 0
Unit division: 0

## 2012-09-08 LAB — CBC
HCT: 40.2 % (ref 39.0–52.0)
Hemoglobin: 14.1 g/dL (ref 13.0–17.0)
MCH: 33.6 pg (ref 26.0–34.0)
MCHC: 35.1 g/dL (ref 30.0–36.0)
MCV: 95.7 fL (ref 78.0–100.0)
Platelets: 153 10*3/uL (ref 150–400)
RBC: 4.2 MIL/uL — ABNORMAL LOW (ref 4.22–5.81)
RDW: 13.3 % (ref 11.5–15.5)
WBC: 11.6 10*3/uL — ABNORMAL HIGH (ref 4.0–10.5)

## 2012-09-08 LAB — HEMOGLOBIN A1C
Hgb A1c MFr Bld: 6.5 % — ABNORMAL HIGH (ref <5.7)
Mean Plasma Glucose: 140 mg/dL — ABNORMAL HIGH (ref <117)

## 2012-09-08 NOTE — Progress Notes (Addendum)
VASCULAR AND VEIN SURGERY POST - OP CEA PROGRESS NOTE  Date of Surgery: 09/07/2012  Surgeon(s): Pryor Ochoa, MD Fransisco Hertz, MD 1 Day Post-Op left CEA .  HPI: Sean Lozano is a 79 y.o. male who is 1 Day Post-Op left CEA . Patient is doing well. No further issues of hypotension Patient denies headache; Patient denies difficulty swallowing; denies weakness in upper or lower extremities; Pt. denies other symptoms of stroke or TIA. Ambulating Has virtigo at home - uses cane  Significant Diagnostic Studies: CBC Lab Results  Component Value Date   WBC 11.6* 09/08/2012   HGB 14.1 09/08/2012   HCT 40.2 09/08/2012   MCV 95.7 09/08/2012   PLT 153 09/08/2012    BMET    Component Value Date/Time   NA 137 09/08/2012 0445   K 3.8 09/08/2012 0445   CL 104 09/08/2012 0445   CO2 24 09/08/2012 0445   GLUCOSE 115* 09/08/2012 0445   BUN 9 09/08/2012 0445   CREATININE 0.92 09/08/2012 0445   CALCIUM 8.8 09/08/2012 0445   GFRNONAA 79* 09/08/2012 0445   GFRAA >90 09/08/2012 0445    COAG Lab Results  Component Value Date   INR 1.01 09/05/2012   INR 1.01 07/22/2012   INR 1.05 07/09/2012   No results found for this basename: PTT      Intake/Output Summary (Last 24 hours) at 09/08/12 0731 Last data filed at 09/08/12 0000  Gross per 24 hour  Intake   3850 ml  Output   1650 ml  Net   2200 ml    Physical Exam:  BP Readings from Last 3 Encounters:  09/07/12 111/60  09/07/12 111/60  09/05/12 137/84   Temp Readings from Last 3 Encounters:  09/07/12 98 F (36.7 C) Oral  09/07/12 98 F (36.7 C) Oral  09/05/12 97.7 F (36.5 C)    SpO2 Readings from Last 3 Encounters:  09/07/12 92%  09/07/12 92%  09/05/12 95%   Pulse Readings from Last 3 Encounters:  09/07/12 77  09/07/12 77  09/05/12 67    Pt is A&O x 3 Gait is normal Speech is fluent left Neck Wound is healing well Patient with Negative tongue deviation and Negative facial droop Pt has good and equal strength in all  extremities  Assessment/Plan:: Sean Lozano is a 79 y.o. male is S/P Left CEA Pt is voiding, ambulating and taking po well Neuro exam WNL Hypotension resolved Discharge to: Home Follow-up in 2 weeks  Minimal JP drainage - will DC  ROCZNIAK,REGINA J  09/08/2012 7:31 AM  Agree with above assessment Left neck wound looks good we'll remove JP drain Neurologic exam normal Swallowing well with no hoarseness and ambulating well  Agree with DC home today and return to office in 2 weeks

## 2012-09-08 NOTE — Progress Notes (Signed)
A-line dc'd by night nurse.  Site with pressure dressing intact, no sign of bleeding, positive cms.

## 2012-09-08 NOTE — Progress Notes (Addendum)
Discharge instructions given to wife and patient with teachback. Questions asked and answered, nodded head for understanding. Discharged to home with wife and son. No complaints. JP removed intact has small amount of bleeding, patient tol. without event. Back to check lt dressing to chest  and saturated with bloody secretions. New pressure dressing placed, patient and wife given instructions.  No drainage to dressing when discharged.

## 2012-09-13 ENCOUNTER — Ambulatory Visit: Payer: Medicare Other | Admitting: Physical Therapy

## 2012-09-19 ENCOUNTER — Ambulatory Visit: Payer: Medicare Other | Admitting: Internal Medicine

## 2012-09-19 ENCOUNTER — Ambulatory Visit: Payer: Medicare Other | Admitting: Vascular Surgery

## 2012-09-20 ENCOUNTER — Encounter: Payer: Self-pay | Admitting: Vascular Surgery

## 2012-09-21 ENCOUNTER — Ambulatory Visit (INDEPENDENT_AMBULATORY_CARE_PROVIDER_SITE_OTHER): Payer: Medicare Other | Admitting: Vascular Surgery

## 2012-09-21 ENCOUNTER — Encounter: Payer: Self-pay | Admitting: Vascular Surgery

## 2012-09-21 ENCOUNTER — Other Ambulatory Visit: Payer: Self-pay | Admitting: *Deleted

## 2012-09-21 DIAGNOSIS — Z48812 Encounter for surgical aftercare following surgery on the circulatory system: Secondary | ICD-10-CM

## 2012-09-21 DIAGNOSIS — I6529 Occlusion and stenosis of unspecified carotid artery: Secondary | ICD-10-CM

## 2012-09-21 NOTE — Progress Notes (Signed)
Subjective:     Patient ID: Sean Lozano, male   DOB: December 16, 1933, 79 y.o.   MRN: [id number]  HPI this 79 year old male returns for initial followup 2 weeks post left carotid endarterectomy for severe left carotid stenosis. The patient had a few falling episodes preoperatively the etiology of which was on certain. He has had no difficulty swallowing, hoarseness, lateralizing weakness, amaurosis fugax, diplopia, blurred vision, or syncope since his surgery. He has continued on his daily Plavix which he did perioperatively.  Past Medical History  Diagnosis Date  . CAD (coronary artery disease)     a. CABG x 6 in 1992. b. s/p DES x 2 to SVG -> OM1/OM2 in 09/2010. c. s/p DES to mid LAD 10/2010. d. NSTEMI (off DAPT) s/p PTCA of instent renarrowing of DES in SVG-OM, Recs for lifelong DAPT   . Hyperlipidemia     intolerance to Crestor Zocor Vytorin. He tolerates Pravachol...low HDL  . GERD (gastroesophageal reflux disease)   . Rosacea   . Prostatitis   . Depression   . Benign prostatic hypertrophy   . Asthma   . Paresthesias     successful surgery by Dr. Julio Sicks  . Arthritis   . Diabetes mellitus     type II-diet  . Diverticulosis   . Hx of CABG     1992  . Ejection fraction     EF 60%, echo, June, 2012  . BARRETTS ESOPHAGUS 06/02/2007  . Bladder neck obstruction 09/29/2010  . SKIN LESION 06/02/2007  . 1St degree AV block   . Sinus bradycardia     asymptomatic   . Cervical disc disease   . Subdural hematoma     Subdural hematoma secondary to falling off a ladder  April, 2014,  medical therapy,  subdural did not expand while blood levels of ddual antiplatelet therapy decreased over one week.  Episode of chest pain in the hospital,, dual antiplatelet therapy restarted approximately one week after it was held  . Carotid bruit     April, 2014  . Syncope     The patient lost consciousness and hit his head July 09 2012, not clear yet if he had true syncope  . Carotid artery disease    Doppler, May, 2014, 0-39% R. ICA, 80-99% LICA, with recent syncope patient sent for consult with vascular surgery  . Fall at home July 09, 2012  . Concussion July 09, 2012    Scalp    History  Substance Use Topics  . Smoking status: Former Smoker    Start date: 01/03/1955  . Smokeless tobacco: Never Used  . Alcohol Use: Yes     Comment: occ    Family History  Problem Relation Age of Onset  . Heart disease Father     mother  . Aneurysm    . Hyperlipidemia    . Liver cancer      grandfather  . Diabetes Sister     mother    Allergies  Allergen Reactions  . Adhesive (Tape)     Breakdown skin  . Atenolol Other (See Comments)    depression  . Codeine Other (See Comments)    Crazy feeling  . Ezetimibe-Simvastatin Other (See Comments)    Severe hip pain  . Rosuvastatin Other (See Comments)    Severe hip pain  . Simvastatin Other (See Comments)    Severe hip pain  . Ultram (Tramadol) Nausea And Vomiting  . Zofran (Ondansetron Hcl)     Severe nausea and  constipation    Current outpatient prescriptions:ADVAIR DISKUS 250-50 MCG/DOSE AEPB, Inhale 1 puff into the lungs 2 (two) times daily. , Disp: , Rfl: ;  albuterol (PROVENTIL HFA) 108 (90 BASE) MCG/ACT inhaler, Inhale 2 puffs into the lungs every 6 (six) hours as needed for wheezing or shortness of breath. , Disp: , Rfl: ;  aspirin (ASPIRIN EC) 81 MG EC tablet, Take 81 mg by mouth daily.  , Disp: , Rfl:  Cholecalciferol (VITAMIN D3) 2000 UNITS TABS, Take 1 tablet by mouth daily. , Disp: , Rfl: ;  clopidogrel (PLAVIX) 75 MG tablet, Take 1 tablet (75 mg total) by mouth daily., Disp: 30 tablet, Rfl: 5;  cyclobenzaprine (FLEXERIL) 5 MG tablet, Take 5 mg by mouth 3 (three) times daily as needed for muscle spasms., Disp: , Rfl: ;  diphenhydrAMINE (BENADRYL) 25 MG tablet, Take 25 mg by mouth daily as needed for allergies. , Disp: , Rfl:  glucose blood (ONE TOUCH ULTRA TEST) test strip, Use as instructed once daily to check blood sugar.   Diagnosis code 250.02, Disp: 100 each, Rfl: 2;  HYDROcodone-acetaminophen (NORCO/VICODIN) 5-325 MG per tablet, Take 1 tablet by mouth every 6 (six) hours as needed for pain., Disp: 30 tablet, Rfl: 0;  loratadine (CLARITIN) 10 MG tablet, Take 10 mg by mouth daily as needed for allergies. , Disp: , Rfl:  metoprolol succinate (TOPROL-XL) 25 MG 24 hr tablet, Take 12.5 mg by mouth 2 (two) times daily., Disp: , Rfl: ;  mometasone-formoterol (DULERA) 100-5 MCG/ACT AERO, Inhale 2 puffs into the lungs 2 (two) times daily., Disp: 13 g, Rfl: 11;  montelukast (SINGULAIR) 10 MG tablet, Take 10 mg by mouth at bedtime., Disp: , Rfl: ;  niacin (NIASPAN) 1000 MG CR tablet, Take 1,000 mg by mouth at bedtime. Take with aspirin, Disp: , Rfl:  nitroGLYCERIN (NITROLINGUAL) 0.4 MG/SPRAY spray, Place 1 spray under the tongue every 5 (five) minutes as needed for chest pain. , Disp: , Rfl: ;  nitroGLYCERIN (NITROSTAT) 0.4 MG SL tablet, Place 0.4 mg under the tongue every 5 (five) minutes as needed for chest pain. , Disp: , Rfl: ;  pantoprazole (PROTONIX) 40 MG tablet, Take 40 mg by mouth daily., Disp: , Rfl:  pravastatin (PRAVACHOL) 40 MG tablet, Take 1 tablet (40 mg total) by mouth every evening., Disp: 30 tablet, Rfl: 1;  tamsulosin (FLOMAX) 0.4 MG CAPS, Take 0.4 mg by mouth daily after supper., Disp: , Rfl:   BP 136/83  Pulse 64  Temp(Src) 97.9 F (36.6 C) (Oral)  Resp 16  Ht 5' 11.5" (1.816 m)  Wt 209 lb (94.802 kg)  BMI 28.75 kg/m2  SpO2 98%  Body mass index is 28.75 kg/(m^2).          Review of Systems he denies chest pain, dyspnea on exertion, PND, orthopnea, hemoptysis    Objective:   Physical Exam blood pressure 136/83 heart rate 64 respirations 18 General well-developed well-nourished male in no apparent distress alert and oriented x3 Neurologic exam normal Left neck with well healed incision with minimal swelling-3+ carotid pulse no audible bruit    Assessment:     Doing well 2 weeks post left  carotid endarterectomy with resection of redundant carotid artery and primary reanastomosis    Plan:     Return in 6 months with carotid duplex exam in our office to check both carotid arteries

## 2012-09-26 ENCOUNTER — Encounter: Payer: Self-pay | Admitting: Cardiology

## 2012-09-26 ENCOUNTER — Other Ambulatory Visit: Payer: Self-pay

## 2012-09-26 DIAGNOSIS — E785 Hyperlipidemia, unspecified: Secondary | ICD-10-CM

## 2012-09-26 MED ORDER — NIACIN ER (ANTIHYPERLIPIDEMIC) 1000 MG PO TBCR: 1000.0000 mg | EXTENDED_RELEASE_TABLET | Freq: Every day | ORAL | Status: DC

## 2012-10-02 ENCOUNTER — Encounter: Payer: Self-pay | Admitting: Cardiology

## 2012-10-04 ENCOUNTER — Ambulatory Visit (INDEPENDENT_AMBULATORY_CARE_PROVIDER_SITE_OTHER): Payer: Medicare Other | Admitting: Cardiology

## 2012-10-04 ENCOUNTER — Encounter: Payer: Self-pay | Admitting: Cardiology

## 2012-10-04 VITALS — BP 110/74 | HR 72 | Ht 71.5 in | Wt 209.8 lb

## 2012-10-04 DIAGNOSIS — S065XAA Traumatic subdural hemorrhage with loss of consciousness status unknown, initial encounter: Secondary | ICD-10-CM

## 2012-10-04 DIAGNOSIS — R55 Syncope and collapse: Secondary | ICD-10-CM

## 2012-10-04 DIAGNOSIS — I2510 Atherosclerotic heart disease of native coronary artery without angina pectoris: Secondary | ICD-10-CM

## 2012-10-04 DIAGNOSIS — S065X9A Traumatic subdural hemorrhage with loss of consciousness of unspecified duration, initial encounter: Secondary | ICD-10-CM

## 2012-10-04 DIAGNOSIS — I779 Disorder of arteries and arterioles, unspecified: Secondary | ICD-10-CM

## 2012-10-04 DIAGNOSIS — I6200 Nontraumatic subdural hemorrhage, unspecified: Secondary | ICD-10-CM

## 2012-10-04 MED ORDER — PANTOPRAZOLE SODIUM 40 MG PO TBEC: 40.0000 mg | DELAYED_RELEASE_TABLET | Freq: Every day | ORAL | Status: DC

## 2012-10-04 MED ORDER — CLOPIDOGREL BISULFATE 75 MG PO TABS: 75.0000 mg | ORAL_TABLET | Freq: Every day | ORAL | Status: DC

## 2012-10-04 MED ORDER — NIACIN ER (ANTIHYPERLIPIDEMIC) 1000 MG PO TBCR: 1000.0000 mg | EXTENDED_RELEASE_TABLET | Freq: Every day | ORAL | Status: DC

## 2012-10-04 MED ORDER — METOPROLOL SUCCINATE ER 25 MG PO TB24: 12.5000 mg | ORAL_TABLET | Freq: Two times a day (BID) | ORAL | Status: DC

## 2012-10-04 MED ORDER — PRAVASTATIN SODIUM 40 MG PO TABS: 40.0000 mg | ORAL_TABLET | Freq: Every evening | ORAL | Status: DC

## 2012-10-04 NOTE — Assessment & Plan Note (Signed)
He appears to be completely recovered from this. The subdural was related to a fall. He was put back on antiplatelet medicines after only 5 days after the fall and he is done extremely well.

## 2012-10-04 NOTE — Progress Notes (Signed)
HPI  The patient is seen back to followup his coronary disease and his carotid artery disease. I saw him last in the office on able 21st, 2014. At that time there was an extensive note about his hospitalization for subdural hematoma from a fall.. He had a carotid bruit. I made a decision to proceed with carotid Dopplers. This showed a tight carotid. The patient needed surgery. I spoke with Dr. Hart Rochester who said that he could do the procedure with the patient on Plavix. It has been very important to keep him on Plavix.  An additional part of the workup for his initial fall was a 21 day event recorder. This was done and I have reviewed it completely. It shows only sinus rhythm. There is no bradycardia or tachycardia.  Left carotid endarterectomy was done successfully. He is now back in the office and doing extremely well. Vertigo that he had related to his fall is now gone completely. He is extremely appreciative of everyone's care. He's feeling very well.  Allergies  Allergen Reactions  . Adhesive (Tape)     Breakdown skin  . Atenolol Other (See Comments)    depression  . Codeine Other (See Comments)    Crazy feeling  . Ezetimibe-Simvastatin Other (See Comments)    Severe hip pain  . Rosuvastatin Other (See Comments)    Severe hip pain  . Simvastatin Other (See Comments)    Severe hip pain  . Ultram (Tramadol) Nausea And Vomiting  . Zofran (Ondansetron Hcl)     Severe nausea and constipation    Current Outpatient Prescriptions  Medication Sig Dispense Refill  . ADVAIR DISKUS 250-50 MCG/DOSE AEPB Inhale 1 puff into the lungs 2 (two) times daily.       Marland Kitchen albuterol (PROVENTIL HFA) 108 (90 BASE) MCG/ACT inhaler Inhale 2 puffs into the lungs every 6 (six) hours as needed for wheezing or shortness of breath.       Marland Kitchen aspirin (ASPIRIN EC) 81 MG EC tablet Take 81 mg by mouth daily.        . Cholecalciferol (VITAMIN D3) 2000 UNITS TABS Take 1 tablet by mouth daily.       . clopidogrel (PLAVIX)  75 MG tablet Take 1 tablet (75 mg total) by mouth daily.  30 tablet  5  . cyclobenzaprine (FLEXERIL) 5 MG tablet Take 5 mg by mouth 3 (three) times daily as needed for muscle spasms.      . diphenhydrAMINE (BENADRYL) 25 MG tablet Take 25 mg by mouth daily as needed for allergies.       Marland Kitchen glucose blood (ONE TOUCH ULTRA TEST) test strip Use as instructed once daily to check blood sugar.  Diagnosis code 250.02  100 each  2  . HYDROcodone-acetaminophen (NORCO/VICODIN) 5-325 MG per tablet Take 1 tablet by mouth every 6 (six) hours as needed for pain.  30 tablet  0  . loratadine (CLARITIN) 10 MG tablet Take 10 mg by mouth daily as needed for allergies.       . metoprolol succinate (TOPROL-XL) 25 MG 24 hr tablet Take 12.5 mg by mouth 2 (two) times daily.      . mometasone-formoterol (DULERA) 100-5 MCG/ACT AERO Inhale 2 puffs into the lungs 2 (two) times daily.  13 g  11  . montelukast (SINGULAIR) 10 MG tablet Take 10 mg by mouth at bedtime.      . niacin (NIASPAN) 1000 MG CR tablet Take 1 tablet (1,000 mg total) by mouth at bedtime. Take  with aspirin  30 tablet  3  . nitroGLYCERIN (NITROLINGUAL) 0.4 MG/SPRAY spray Place 1 spray under the tongue every 5 (five) minutes as needed for chest pain.       . nitroGLYCERIN (NITROSTAT) 0.4 MG SL tablet Place 0.4 mg under the tongue every 5 (five) minutes as needed for chest pain.       . pantoprazole (PROTONIX) 40 MG tablet Take 40 mg by mouth daily.      . pravastatin (PRAVACHOL) 40 MG tablet Take 1 tablet (40 mg total) by mouth every evening.  30 tablet  1  . tamsulosin (FLOMAX) 0.4 MG CAPS Take 0.4 mg by mouth daily after supper.       No current facility-administered medications for this visit.    History   Social History  . Marital Status: Married    Spouse Name: N/A    Number of Children: 3  . Years of Education: N/A   Occupational History  . retired-real estate    Social History Main Topics  . Smoking status: Former Smoker    Start date:  01/03/1955  . Smokeless tobacco: Never Used  . Alcohol Use: Yes     Comment: occ  . Drug Use: No  . Sexually Active: Not on file   Other Topics Concern  . Not on file   Social History Narrative   Pt has 51yo son-paranoid schizophrenic, unempolyed, lives with him.    Family History  Problem Relation Age of Onset  . Heart disease Father     mother  . Aneurysm    . Hyperlipidemia    . Liver cancer      grandfather  . Diabetes Sister     mother    Past Medical History  Diagnosis Date  . CAD (coronary artery disease)     a. CABG x 6 in 1992. b. s/p DES x 2 to SVG -> OM1/OM2 in 09/2010. c. s/p DES to mid LAD 10/2010. d. NSTEMI (off DAPT) s/p PTCA of instent renarrowing of DES in SVG-OM, Recs for lifelong DAPT   . Hyperlipidemia     intolerance to Crestor Zocor Vytorin. He tolerates Pravachol...low HDL  . GERD (gastroesophageal reflux disease)   . Rosacea   . Prostatitis   . Depression   . Benign prostatic hypertrophy   . Asthma   . Paresthesias     successful surgery by Dr. Julio Sicks  . Arthritis   . Diabetes mellitus     type II-diet  . Diverticulosis   . Hx of CABG     1992  . Ejection fraction     EF 60%, echo, June, 2012  . BARRETTS ESOPHAGUS 06/02/2007  . Bladder neck obstruction 09/29/2010  . SKIN LESION 06/02/2007  . 1St degree AV block   . Sinus bradycardia     asymptomatic   . Cervical disc disease   . Subdural hematoma     Subdural hematoma secondary to falling off a ladder  April, 2014,  medical therapy,  subdural did not expand while blood levels of ddual antiplatelet therapy decreased over one week.  Episode of chest pain in the hospital,, dual antiplatelet therapy restarted approximately one week after it was held  . Carotid bruit     April, 2014  . Syncope     The patient lost consciousness and hit his head July 09 2012, not clear yet if he had true syncope  . Carotid artery disease     Doppler, May, 2014, 0-39% R.  ICA, 80-99% LICA, with recent  syncope patient sent for consult with vascular surgery  . Fall at home July 09, 2012  . Concussion July 09, 2012    Scalp    Past Surgical History  Procedure Laterality Date  . C-spine disc surgery  03/2006  . C4-5 surgery  01/2009  . Cardiac catheterization  July 2012    stent placed  X's  4  . Appendectomy    . Tonsillectomy    . Coccyx fracture surgery  1997  . Coronary artery bypass graft  1992  . Barretts esophagus   20/20/09  . Bladder neck obstruction  09/29/10  . Endarterectomy Left 09/07/2012    Procedure: ENDARTERECTOMY CAROTID with patch angioplasty;  Surgeon: Pryor Ochoa, MD;  Location: [facility];  Service: Vascular;  Laterality: Left;    Patient Active Problem List   Diagnosis Date Noted  . Occlusion and stenosis of carotid artery without mention of cerebral infarction 08/22/2012  . Carotid artery disease   . Syncope   . Subdural hematoma   . Chronic diastolic congestive heart failure, NYHA class 1/ECHO 2010 07/10/2012  . SDH (subdural hematoma) 07/09/2012  . Erectile dysfunction 04/01/2011  . Ejection fraction   . CAD (coronary artery disease)   . Hyperlipidemia   . Type II or unspecified type diabetes mellitus without mention of complication, uncontrolled   . Hx of CABG   . Preventative health care 09/29/2010  . Dyspnea 09/29/2010  . Bladder neck obstruction 09/29/2010  . DEPRESSION 06/04/2007  . ASTHMA 06/04/2007  . GERD 06/04/2007  . BENIGN PROSTATIC HYPERTROPHY 06/04/2007  . BARRETTS ESOPHAGUS 06/02/2007  . Rosacea 06/02/2007  . SKIN LESION 06/02/2007  . FATIGUE 06/02/2007    ROS   Patient denies fever, chills, headache, sweats, rash, change in vision, change in hearing, chest pain, cough, nausea vomiting, urinary symptoms. All other systems are reviewed and are negative.  PHYSICAL EXAM   He looks great today. He has lost weight. There is no jugulovenous distention. His left carotid endarterectomy site is beautifully healed. Lungs are clear.  Respiratory effort is nonlabored. He has lost a little weight. Cardiac exam reveals S1 and S2. There no clicks or significant murmurs. The abdomen is soft. There is no peripheral edema. There are no musculoskeletal deformities. There are no skin rashes.  Filed Vitals:   10/04/12 1110  BP: 110/74  Pulse: 72  Height: 5' 11.5" (1.816 m)  Weight: 209 lb 12.8 oz (95.165 kg)  SpO2: 97%     ASSESSMENT & PLAN

## 2012-10-04 NOTE — Assessment & Plan Note (Signed)
It is not clear that the patient had a true syncopal episode. He did lose consciousness for a brief period after he hit the floor. His event recorder shows no arrhythmias. He has normal left ventricular function. He had a very tight carotid that has now been treated surgically. He had an event recorder showing no arrhythmias in 21 days. At this point I feel that no further workup is needed.  As part of today's evaluation I have spent greater than 25 minutes with his total valuation. I have done extensive review of his records. More than half of this time however was spent discussing all of the events with him and his overall care and making plans for his future cardiac status.

## 2012-10-04 NOTE — Assessment & Plan Note (Signed)
He is doing extremely well after his carotid endarterectomy. No further workup.

## 2012-10-04 NOTE — Assessment & Plan Note (Signed)
Coronary disease remains stable. It is very important to keep him on dual antiplatelet therapy. He has not had any recurrent chest discomfort.

## 2012-10-04 NOTE — Patient Instructions (Addendum)
**Note De-identified  Obfuscation** Your physician recommends that you continue on your current medications as directed. Please refer to the Current Medication list given to you today.  Your physician wants you to follow-up in: 6 months. You will receive a reminder letter in the mail two months in advance. If you don't receive a letter, please call our office to schedule the follow-up appointment.  

## 2012-10-17 ENCOUNTER — Other Ambulatory Visit: Payer: Self-pay

## 2012-10-17 MED ORDER — TAMSULOSIN HCL 0.4 MG PO CAPS: 0.4000 mg | ORAL_CAPSULE | Freq: Every day | ORAL | Status: DC

## 2012-10-17 MED ORDER — MONTELUKAST SODIUM 10 MG PO TABS: 10.0000 mg | ORAL_TABLET | Freq: Every day | ORAL | Status: DC

## 2012-10-24 ENCOUNTER — Encounter: Payer: Self-pay | Admitting: Internal Medicine

## 2012-10-24 ENCOUNTER — Ambulatory Visit (INDEPENDENT_AMBULATORY_CARE_PROVIDER_SITE_OTHER): Payer: 59 | Admitting: Internal Medicine

## 2012-10-24 ENCOUNTER — Other Ambulatory Visit (INDEPENDENT_AMBULATORY_CARE_PROVIDER_SITE_OTHER): Payer: 59

## 2012-10-24 VITALS — BP 112/70 | HR 72 | Temp 97.5°F | Ht 71.5 in | Wt 211.0 lb

## 2012-10-24 DIAGNOSIS — IMO0001 Reserved for inherently not codable concepts without codable children: Secondary | ICD-10-CM

## 2012-10-24 DIAGNOSIS — Z0000 Encounter for general adult medical examination without abnormal findings: Secondary | ICD-10-CM

## 2012-10-24 LAB — LIPID PANEL
Cholesterol: 136 mg/dL (ref 0–200)
HDL: 37.7 mg/dL — ABNORMAL LOW (ref >39.00)
LDL Cholesterol: 82 mg/dL (ref 0–99)
Total CHOL/HDL Ratio: 4
Triglycerides: 80 mg/dL (ref 0.0–149.0)
VLDL: 16 mg/dL (ref 0.0–40.0)

## 2012-10-24 LAB — PSA: PSA: 1.51 ng/mL (ref 0.10–4.00)

## 2012-10-24 LAB — TSH: TSH: 2.13 u[IU]/mL (ref 0.35–5.50)

## 2012-10-24 MED ORDER — TAMSULOSIN HCL 0.4 MG PO CAPS: 0.4000 mg | ORAL_CAPSULE | Freq: Every day | ORAL | Status: DC

## 2012-10-24 MED ORDER — MONTELUKAST SODIUM 10 MG PO TABS: 10.0000 mg | ORAL_TABLET | Freq: Every day | ORAL | Status: DC

## 2012-10-24 MED ORDER — PANTOPRAZOLE SODIUM 40 MG PO TBEC: 40.0000 mg | DELAYED_RELEASE_TABLET | Freq: Every day | ORAL | Status: DC

## 2012-10-24 NOTE — Patient Instructions (Signed)
Please continue all other medications as before, and refills have been done if requested. Please have the pharmacy call with any other refills you may need. Please continue your efforts at being more active, low cholesterol diet, and weight control. You are otherwise up to date with prevention measures today. Please keep your appointments with your specialists as you have planned Please go to the LAB in the Basement (turn left off the elevator) for the tests to be done today You will be contacted by phone if any changes need to be made immediately.  Otherwise, you will receive a letter about your results with an explanation, but please check with MyChart first.  Please remember to sign up for My Chart if you have not done so, as this will be important to you in the future with finding out test results, communicating by private email, and scheduling acute appointments online when needed.  Please return in 6 months, or sooner if needed, with Lab testing done 3-5 days before  

## 2012-10-24 NOTE — Progress Notes (Addendum)
Subjective:    Patient ID: Sean Lozano, male    DOB: 1934-02-09, 79 y.o.   MRN: [id number]  HPI Here to f/u and wellness - "I havent felt this good in a long time." , in eval for syncope was found left carotid dz, now s/o recent left CEA per Dr Hart Rochester and has done well; about that time finished PT, vertigo has since resolved. Here to f/u; overall doing ok,  Pt denies chest pain, increased sob or doe, wheezing, orthopnea, PND, increased LE swelling, palpitations, dizziness or syncope.  Pt denies polydipsia, polyuria, or low sugar symptoms such as weakness or confusion improved with po intake.  Pt denies new neurological symptoms such as new headache, or facial or extremity weakness or numbness.   Pt states overall good compliance with meds, has been trying to follow lower cholesterol, diabetic diet, with wt overall stable,  but little exercise however. Last a1c 6.5 in may 2014. CBG's evrey other day in loww 100's (low 98).  Doing silver sneakers at the gym, has a trainer for the group as well, trying to stay active 3 times per wk.  Due f/u Dr Hart Rochester dec 2014, and Dr Myrtis Ser about that time as well.  Pt denies polydipsia, polyuria, or low sugar symptoms. Pt states overall good compliance with treatment and medications, good tolerability, and has been trying to follow lower cholesterol diet.  Pt denies worsening depressive symptoms, suicidal ideation or panic. No fever, night sweats, wt loss, loss of appetite, or other constitutional symptoms.  Pt states good ability with ADL's, has mid to high fall risk based on history recent, home safety reviewed and adequate, no other significant changes in hearing or vision, and only occasionally active with exercise, to do more as above Past Medical History  Diagnosis Date  . CAD (coronary artery disease)     a. CABG x 6 in 1992. b. s/p DES x 2 to SVG -> OM1/OM2 in 09/2010. c. s/p DES to mid LAD 10/2010. d. NSTEMI (off DAPT) s/p PTCA of instent renarrowing of DES in  SVG-OM, Recs for lifelong DAPT   . Hyperlipidemia     intolerance to Crestor Zocor Vytorin. He tolerates Pravachol...low HDL  . GERD (gastroesophageal reflux disease)   . Rosacea   . Prostatitis   . Depression   . Benign prostatic hypertrophy   . Asthma   . Paresthesias     successful surgery by Dr. Julio Sicks  . Arthritis   . Diabetes mellitus     type II-diet  . Diverticulosis   . Hx of CABG     1992  . Ejection fraction     EF 60%, echo, June, 2012  . BARRETTS ESOPHAGUS 06/02/2007  . Bladder neck obstruction 09/29/2010  . SKIN LESION 06/02/2007  . 1St degree AV block   . Sinus bradycardia     asymptomatic   . Cervical disc disease   . Subdural hematoma     Subdural hematoma secondary to falling off a ladder  April, 2014,  medical therapy,  subdural did not expand while blood levels of ddual antiplatelet therapy decreased over one week.  Episode of chest pain in the hospital,, dual antiplatelet therapy restarted approximately one week after it was held  . Carotid bruit     April, 2014  . Syncope     The patient lost consciousness and hit his head July 09 2012, not clear yet if he had true syncope  . Carotid artery disease  Doppler, May, 2014, 0-39% R. ICA, 80-99% LICA, with recent syncope patient sent for consult with vascular surgery  . Fall at home July 09, 2012  . Concussion July 09, 2012    Scalp   Past Surgical History  Procedure Laterality Date  . C-spine disc surgery  03/2006  . C4-5 surgery  01/2009  . Cardiac catheterization  July 2012    stent placed  X's  4  . Appendectomy    . Tonsillectomy    . Coccyx fracture surgery  1997  . Coronary artery bypass graft  1992  . Barretts esophagus   20/20/09  . Bladder neck obstruction  09/29/10  . Endarterectomy Left 09/07/2012    Procedure: ENDARTERECTOMY CAROTID with patch angioplasty;  Surgeon: Pryor Ochoa, MD;  Location: [facility] OR;  Service: Vascular;  Laterality: Left;    reports that he has quit smoking. He  started smoking about 57 years ago. He has never used smokeless tobacco. He reports that  drinks alcohol. He reports that he does not use illicit drugs. family history includes Aneurysm in an unspecified family member; Diabetes in his sister; Heart disease in his father; Hyperlipidemia in an unspecified family member; and Liver cancer in an unspecified family member. Allergies  Allergen Reactions  . Adhesive (Tape)     Breakdown skin  . Atenolol Other (See Comments)    depression  . Codeine Other (See Comments)    Crazy feeling  . Ezetimibe-Simvastatin Other (See Comments)    Severe hip pain  . Rosuvastatin Other (See Comments)    Severe hip pain  . Simvastatin Other (See Comments)    Severe hip pain  . Ultram (Tramadol) Nausea And Vomiting  . Zofran (Ondansetron Hcl)     Severe nausea and constipation   Current Outpatient Prescriptions on File Prior to Visit  Medication Sig Dispense Refill  . aspirin (ASPIRIN EC) 81 MG EC tablet Take 81 mg by mouth daily.        . Cholecalciferol (VITAMIN D3) 2000 UNITS TABS Take 1 tablet by mouth daily.       . clopidogrel (PLAVIX) 75 MG tablet Take 1 tablet (75 mg total) by mouth daily.  90 tablet  3  . glucose blood (ONE TOUCH ULTRA TEST) test strip Use as instructed once daily to check blood sugar.  Diagnosis code 250.02  100 each  2  . metoprolol succinate (TOPROL-XL) 25 MG 24 hr tablet Take 0.5 tablets (12.5 mg total) by mouth 2 (two) times daily.  90 tablet  3  . mometasone-formoterol (DULERA) 100-5 MCG/ACT AERO Inhale 2 puffs into the lungs 2 (two) times daily.  13 g  11  . niacin (NIASPAN) 1000 MG CR tablet Take 1 tablet (1,000 mg total) by mouth at bedtime. Take with aspirin  90 tablet  3  . nitroGLYCERIN (NITROLINGUAL) 0.4 MG/SPRAY spray Place 1 spray under the tongue every 5 (five) minutes as needed for chest pain.       . nitroGLYCERIN (NITROSTAT) 0.4 MG SL tablet Place 0.4 mg under the tongue every 5 (five) minutes as needed for chest  pain.       . pravastatin (PRAVACHOL) 40 MG tablet Take 1 tablet (40 mg total) by mouth every evening.  90 tablet  3  . diphenhydrAMINE (BENADRYL) 25 MG tablet Take 25 mg by mouth daily as needed for allergies.       Marland Kitchen loratadine (CLARITIN) 10 MG tablet Take 10 mg by mouth daily as needed for allergies.  No current facility-administered medications on file prior to visit.   Review of Systems Constitutional: Negative for diaphoresis, activity change, appetite change or unexpected weight change.  HENT: Negative for hearing loss, ear pain, facial swelling, mouth sores and neck stiffness.   Eyes: Negative for pain, redness and visual disturbance.  Respiratory: Negative for shortness of breath and wheezing.   Cardiovascular: Negative for chest pain and palpitations.  Gastrointestinal: Negative for diarrhea, blood in stool, abdominal distention or other pain Genitourinary: Negative for hematuria, flank pain or change in urine volume.  Musculoskeletal: Negative for myalgias and joint swelling.  Skin: Negative for color change and wound.  Neurological: Negative for syncope and numbness. other than noted Hematological: Negative for adenopathy.  Psychiatric/Behavioral: Negative for hallucinations, self-injury, decreased concentration and agitation.      Objective:   Physical Exam BP 112/70  Pulse 72  Temp(Src) 97.5 F (36.4 C) (Oral)  Ht 5' 11.5" (1.816 m)  Wt 211 lb (95.709 kg)  BMI 29.02 kg/m2  SpO2 94% VS noted,  Constitutional: Pt is oriented to person, place, and time. Appears well-developed and well-nourished.  Head: Normocephalic and atraumatic.  Right Ear: External ear normal.  Left Ear: External ear normal.  Nose: Nose normal.  Mouth/Throat: Oropharynx is clear and moist.  Eyes: Conjunctivae and EOM are normal. Pupils are equal, round, and reactive to light.  Neck: Normal range of motion. Neck supple. No JVD present. No tracheal deviation present.  Cardiovascular: Normal  rate, regular rhythm, normal heart sounds and intact distal pulses.   Pulmonary/Chest: Effort normal and breath sounds normal.  Abdominal: Soft. Bowel sounds are normal. There is no tenderness. No HSM  Musculoskeletal: Normal range of motion. Exhibits no edema.  Lymphadenopathy:  Has no cervical adenopathy.  Neurological: Pt is alert and oriented to person, place, and time. Pt has normal reflexes. No cranial nerve deficit. Gait - slow, somewhat unsteady Skin: Skin is warm and dry. No rash noted.  Psychiatric:  Has  normal mood and affect. Behavior is normal.     Assessment & Plan:  Quality Measures addressed:  CAD  - drug therapy for lower cholesterol: pt declines further medication ACE/ARB therapy for CAD, Diabetes, and/or LVSD: pt declines further medication

## 2012-10-24 NOTE — Assessment & Plan Note (Signed)

## 2012-11-03 ENCOUNTER — Encounter: Payer: Self-pay | Admitting: Gastroenterology

## 2013-02-15 ENCOUNTER — Other Ambulatory Visit: Payer: Self-pay

## 2013-03-23 ENCOUNTER — Ambulatory Visit (INDEPENDENT_AMBULATORY_CARE_PROVIDER_SITE_OTHER): Payer: 59 | Admitting: Cardiology

## 2013-03-23 ENCOUNTER — Encounter: Payer: Self-pay | Admitting: Cardiology

## 2013-03-23 VITALS — BP 130/76 | HR 66 | Ht 71.5 in | Wt 217.0 lb

## 2013-03-23 DIAGNOSIS — I779 Disorder of arteries and arterioles, unspecified: Secondary | ICD-10-CM

## 2013-03-23 DIAGNOSIS — I2510 Atherosclerotic heart disease of native coronary artery without angina pectoris: Secondary | ICD-10-CM

## 2013-03-23 DIAGNOSIS — E785 Hyperlipidemia, unspecified: Secondary | ICD-10-CM

## 2013-03-23 NOTE — Assessment & Plan Note (Signed)
His carotid disease is stable after his surgery May, 2014. He will be following up with Dr. Hart Rochester.

## 2013-03-23 NOTE — Progress Notes (Signed)
HPI  Patient is seen to follow coronary disease. He's actually doing well. He had fallen with a subdural hematoma. He had a carotid bruit and it turned out that he had tight stenosis. He had surgery while on Plavix and he did very well. An event recorder showed no major arrhythmias. He's done very well. He has very slight residual vertigo. He's not having any chest pain or shortness of breath.  Allergies  Allergen Reactions  . Adhesive [Tape]     Breakdown skin  . Atenolol Other (See Comments)    depression  . Codeine Other (See Comments)    Crazy feeling  . Ezetimibe-Simvastatin Other (See Comments)    Severe hip pain  . Rosuvastatin Other (See Comments)    Severe hip pain  . Simvastatin Other (See Comments)    Severe hip pain  . Ultram [Tramadol] Nausea And Vomiting  . Zofran [Ondansetron Hcl]     Severe nausea and constipation    Current Outpatient Prescriptions  Medication Sig Dispense Refill  . aspirin (ASPIRIN EC) 81 MG EC tablet Take 81 mg by mouth daily.        . Cholecalciferol (VITAMIN D3) 2000 UNITS TABS Take 1 tablet by mouth daily.       . clopidogrel (PLAVIX) 75 MG tablet Take 1 tablet (75 mg total) by mouth daily.  90 tablet  3  . diphenhydrAMINE (BENADRYL) 25 MG tablet Take 25 mg by mouth daily as needed for allergies.       Marland Kitchen glucose blood (ONE TOUCH ULTRA TEST) test strip Use as instructed once daily to check blood sugar.  Diagnosis code 250.02  100 each  2  . loratadine (CLARITIN) 10 MG tablet Take 10 mg by mouth daily as needed for allergies.       . metoprolol succinate (TOPROL-XL) 25 MG 24 hr tablet Take 0.5 tablets (12.5 mg total) by mouth 2 (two) times daily.  90 tablet  3  . mometasone-formoterol (DULERA) 100-5 MCG/ACT AERO Inhale 2 puffs into the lungs 2 (two) times daily.  13 g  11  . montelukast (SINGULAIR) 10 MG tablet Take 1 tablet (10 mg total) by mouth at bedtime.  90 tablet  3  . niacin (NIASPAN) 1000 MG CR tablet Take 1 tablet (1,000 mg total)  by mouth at bedtime. Take with aspirin  90 tablet  3  . nitroGLYCERIN (NITROLINGUAL) 0.4 MG/SPRAY spray Place 1 spray under the tongue every 5 (five) minutes as needed for chest pain.       . nitroGLYCERIN (NITROSTAT) 0.4 MG SL tablet Place 0.4 mg under the tongue every 5 (five) minutes as needed for chest pain.       . pantoprazole (PROTONIX) 40 MG tablet Take 1 tablet (40 mg total) by mouth daily.  90 tablet  3  . pravastatin (PRAVACHOL) 40 MG tablet Take 1 tablet (40 mg total) by mouth every evening.  90 tablet  3  . tamsulosin (FLOMAX) 0.4 MG CAPS Take 1 capsule (0.4 mg total) by mouth daily after supper.  90 capsule  3   No current facility-administered medications for this visit.    History   Social History  . Marital Status: Married    Spouse Name: N/A    Number of Children: 3  . Years of Education: N/A   Occupational History  . retired-real estate    Social History Main Topics  . Smoking status: Former Smoker    Start date: 01/03/1955  . Smokeless  tobacco: Never Used  . Alcohol Use: Yes     Comment: occ  . Drug Use: No  . Sexual Activity: Not on file   Other Topics Concern  . Not on file   Social History Narrative   Pt has 51yo son-paranoid schizophrenic, unempolyed, lives with him.    Family History  Problem Relation Age of Onset  . Heart disease Father     mother  . Aneurysm    . Hyperlipidemia    . Liver cancer      grandfather  . Diabetes Sister     mother    Past Medical History  Diagnosis Date  . CAD (coronary artery disease)     a. CABG x 6 in 1992. b. s/p DES x 2 to SVG -> OM1/OM2 in 09/2010. c. s/p DES to mid LAD 10/2010. d. NSTEMI (off DAPT) s/p PTCA of instent renarrowing of DES in SVG-OM, Recs for lifelong DAPT   . Hyperlipidemia     intolerance to Crestor Zocor Vytorin. He tolerates Pravachol...low HDL  . GERD (gastroesophageal reflux disease)   . Rosacea   . Prostatitis   . Depression   . Benign prostatic hypertrophy   . Asthma   .  Paresthesias     successful surgery by Dr. Julio Sicks  . Arthritis   . Diabetes mellitus     type II-diet  . Diverticulosis   . Hx of CABG     1992  . Ejection fraction     EF 60%, echo, June, 2012  . BARRETTS ESOPHAGUS 06/02/2007  . Bladder neck obstruction 09/29/2010  . SKIN LESION 06/02/2007  . 1St degree AV block   . Sinus bradycardia     asymptomatic   . Cervical disc disease   . Subdural hematoma     Subdural hematoma secondary to falling off a ladder  April, 2014,  medical therapy,  subdural did not expand while blood levels of ddual antiplatelet therapy decreased over one week.  Episode of chest pain in the hospital,, dual antiplatelet therapy restarted approximately one week after it was held  . Carotid bruit     April, 2014  . Syncope     The patient lost consciousness and hit his head July 09 2012, not clear yet if he had true syncope  . Carotid artery disease     Doppler, May, 2014, 0-39% R. ICA, 80-99% LICA, with recent syncope patient sent for consult with vascular surgery  . Fall at home July 09, 2012  . Concussion July 09, 2012    Scalp    Past Surgical History  Procedure Laterality Date  . C-spine disc surgery  03/2006  . C4-5 surgery  01/2009  . Cardiac catheterization  July 2012    stent placed  X's  4  . Appendectomy    . Tonsillectomy    . Coccyx fracture surgery  1997  . Coronary artery bypass graft  1992  . Barretts esophagus   20/20/09  . Bladder neck obstruction  09/29/10  . Endarterectomy Left 09/07/2012    Procedure: ENDARTERECTOMY CAROTID with patch angioplasty;  Surgeon: Pryor Ochoa, MD;  Location: [facility] OR;  Service: Vascular;  Laterality: Left;    Patient Active Problem List   Diagnosis Date Noted  . Occlusion and stenosis of carotid artery without mention of cerebral infarction 08/22/2012  . Carotid artery disease   . Syncope   . Subdural hematoma   . Chronic diastolic congestive heart failure, NYHA class 1/ECHO 2010  07/10/2012  . SDH  (subdural hematoma) 07/09/2012  . Erectile dysfunction 04/01/2011  . Ejection fraction   . CAD (coronary artery disease)   . Hyperlipidemia   . Type II or unspecified type diabetes mellitus without mention of complication, uncontrolled   . Hx of CABG   . Preventative health care 09/29/2010  . Dyspnea 09/29/2010  . Bladder neck obstruction 09/29/2010  . DEPRESSION 06/04/2007  . ASTHMA 06/04/2007  . GERD 06/04/2007  . BENIGN PROSTATIC HYPERTROPHY 06/04/2007  . BARRETTS ESOPHAGUS 06/02/2007  . Rosacea 06/02/2007  . SKIN LESION 06/02/2007  . FATIGUE 06/02/2007    ROS   Patient denies fever, chills, headache, sweats, rash, change in vision, change in hearing, chest pain, cough, nausea vomiting, urinary symptoms. All other systems are reviewed and are negative.  PHYSICAL EXAM  Patient is oriented to person time and place. Affect is normal. He is overweight. There is no jugulovenous distention. Lungs are clear. Respiratory effort is nonlabored. Cardiac exam reveals S1 and S2. There no clicks or significant murmurs. The abdomen is soft. Is no peripheral edema.  Filed Vitals:   03/23/13 1150  BP: 130/76  Pulse: 66  Height: 5' 11.5" (1.816 m)  Weight: 217 lb (98.431 kg)  SpO2: 93%     ASSESSMENT & PLAN

## 2013-03-23 NOTE — Patient Instructions (Signed)
Your physician recommends that you continue on your current medications as directed. Please refer to the Current Medication list given to you today.  Your physician wants you to follow-up in: 6 months. You will receive a reminder letter in the mail two months in advance. If you don't receive a letter, please call our office to schedule the follow-up appointment.  

## 2013-03-23 NOTE — Assessment & Plan Note (Signed)
Coronary disease is stable. We must keep him on aspirin plus Plavix. He is stable at this time.

## 2013-03-23 NOTE — Assessment & Plan Note (Signed)
The patient is current on Pravachol. He also takes niacin. I have discussed both these issues with him at length. We did see some increase in HDL from his niacin over time. I explained to him that many cardiologists are no longer using niacin. In his case, however, I feel it is very reasonable to continue it and this is what he wants to do. I also explained to him that it would be good to have him on a stronger statin. However he had marked symptoms from Crestor and Zocor and Vytorin. He is not willing to make another change and I agree. No change in therapy.

## 2013-03-26 ENCOUNTER — Encounter: Payer: Self-pay | Admitting: Vascular Surgery

## 2013-03-27 ENCOUNTER — Ambulatory Visit (HOSPITAL_COMMUNITY)
Admission: RE | Admit: 2013-03-27 | Discharge: 2013-03-27 | Disposition: A | Discharge status: Home / Self Care | Payer: Medicare Other | Source: Ambulatory Visit | Attending: Vascular Surgery | Admitting: Vascular Surgery

## 2013-03-27 ENCOUNTER — Ambulatory Visit: Payer: Medicare Other | Admitting: Vascular Surgery

## 2013-03-27 ENCOUNTER — Encounter: Payer: Self-pay | Admitting: Vascular Surgery

## 2013-03-27 ENCOUNTER — Other Ambulatory Visit (HOSPITAL_COMMUNITY): Payer: Self-pay

## 2013-03-27 ENCOUNTER — Ambulatory Visit (INDEPENDENT_AMBULATORY_CARE_PROVIDER_SITE_OTHER): Payer: 59 | Admitting: Vascular Surgery

## 2013-03-27 ENCOUNTER — Other Ambulatory Visit: Payer: Medicare Other

## 2013-03-27 DIAGNOSIS — I6529 Occlusion and stenosis of unspecified carotid artery: Secondary | ICD-10-CM | POA: Insufficient documentation

## 2013-03-27 DIAGNOSIS — I658 Occlusion and stenosis of other precerebral arteries: Secondary | ICD-10-CM | POA: Insufficient documentation

## 2013-03-27 DIAGNOSIS — Z48812 Encounter for surgical aftercare following surgery on the circulatory system: Secondary | ICD-10-CM | POA: Insufficient documentation

## 2013-03-27 NOTE — Progress Notes (Signed)
Subjective:     Patient ID: Sean Lozano, male   DOB: 30-Apr-1933, 79 y.o.   MRN: [id number]  HPI this 79 year old male returns 6 months post left carotid endarterectomy for severe asymptomatic left ICA stenosis. He's had no complications or complaints since that time. He denies difficulty swallowing, aphasia, lateralizing weakness, amaurosis fugax, diplopia, blurred vision, and syncope. He does take 1 aspirin per day.  Past Medical History  Diagnosis Date  . CAD (coronary artery disease)     a. CABG x 6 in 1992. b. s/p DES x 2 to SVG -> OM1/OM2 in 09/2010. c. s/p DES to mid LAD 10/2010. d. NSTEMI (off DAPT) s/p PTCA of instent renarrowing of DES in SVG-OM, Recs for lifelong DAPT   . Hyperlipidemia     intolerance to Crestor Zocor Vytorin. He tolerates Pravachol...low HDL  . GERD (gastroesophageal reflux disease)   . Rosacea   . Prostatitis   . Depression   . Benign prostatic hypertrophy   . Asthma   . Paresthesias     successful surgery by Dr. Julio Sicks  . Arthritis   . Diabetes mellitus     type II-diet  . Diverticulosis   . Hx of CABG     1992  . Ejection fraction     EF 60%, echo, June, 2012  . BARRETTS ESOPHAGUS 06/02/2007  . Bladder neck obstruction 09/29/2010  . SKIN LESION 06/02/2007  . 1St degree AV block   . Sinus bradycardia     asymptomatic   . Cervical disc disease   . Subdural hematoma     Subdural hematoma secondary to falling off a ladder  April, 2014,  medical therapy,  subdural did not expand while blood levels of ddual antiplatelet therapy decreased over one week.  Episode of chest pain in the hospital,, dual antiplatelet therapy restarted approximately one week after it was held  . Carotid bruit     April, 2014  . Syncope     The patient lost consciousness and hit his head July 09 2012, not clear yet if he had true syncope  . Carotid artery disease     Doppler, May, 2014, 0-39% R. ICA, 80-99% LICA, with recent syncope patient sent for consult with vascular  surgery  . Fall at home July 09, 2012  . Concussion July 09, 2012    Scalp    History  Substance Use Topics  . Smoking status: Former Smoker    Types: Cigarettes    Start date: 01/03/1955  . Smokeless tobacco: Never Used  . Alcohol Use: Yes     Comment: occ    Family History  Problem Relation Age of Onset  . Heart disease Father     mother  . Aneurysm    . Hyperlipidemia    . Liver cancer      grandfather  . Diabetes Sister     mother    Allergies  Allergen Reactions  . Adhesive [Tape]     Breakdown skin  . Atenolol Other (See Comments)    depression  . Codeine Other (See Comments)    Crazy feeling  . Ezetimibe-Simvastatin Other (See Comments)    Severe hip pain  . Rosuvastatin Other (See Comments)    Severe hip pain  . Simvastatin Other (See Comments)    Severe hip pain  . Ultram [Tramadol] Nausea And Vomiting  . Zofran [Ondansetron Hcl]     Severe nausea and constipation    Current outpatient prescriptions:aspirin (ASPIRIN EC) 81  MG EC tablet, Take 81 mg by mouth daily.  , Disp: , Rfl: ;  Cholecalciferol (VITAMIN D3) 2000 UNITS TABS, Take 1 tablet by mouth daily. , Disp: , Rfl: ;  clopidogrel (PLAVIX) 75 MG tablet, Take 1 tablet (75 mg total) by mouth daily., Disp: 90 tablet, Rfl: 3;  diphenhydrAMINE (BENADRYL) 25 MG tablet, Take 25 mg by mouth daily as needed for allergies. , Disp: , Rfl:  glucose blood (ONE TOUCH ULTRA TEST) test strip, Use as instructed once daily to check blood sugar.  Diagnosis code 250.02, Disp: 100 each, Rfl: 2;  loratadine (CLARITIN) 10 MG tablet, Take 10 mg by mouth daily as needed for allergies. , Disp: , Rfl: ;  metoprolol succinate (TOPROL-XL) 25 MG 24 hr tablet, Take 0.5 tablets (12.5 mg total) by mouth 2 (two) times daily., Disp: 90 tablet, Rfl: 3 mometasone-formoterol (DULERA) 100-5 MCG/ACT AERO, Inhale 2 puffs into the lungs 2 (two) times daily., Disp: 13 g, Rfl: 11;  montelukast (SINGULAIR) 10 MG tablet, Take 1 tablet (10 mg  total) by mouth at bedtime., Disp: 90 tablet, Rfl: 3;  niacin (NIASPAN) 1000 MG CR tablet, Take 1 tablet (1,000 mg total) by mouth at bedtime. Take with aspirin, Disp: 90 tablet, Rfl: 3 nitroGLYCERIN (NITROLINGUAL) 0.4 MG/SPRAY spray, Place 1 spray under the tongue every 5 (five) minutes as needed for chest pain. , Disp: , Rfl: ;  nitroGLYCERIN (NITROSTAT) 0.4 MG SL tablet, Place 0.4 mg under the tongue every 5 (five) minutes as needed for chest pain. , Disp: , Rfl: ;  pantoprazole (PROTONIX) 40 MG tablet, Take 1 tablet (40 mg total) by mouth daily., Disp: 90 tablet, Rfl: 3 pravastatin (PRAVACHOL) 40 MG tablet, Take 1 tablet (40 mg total) by mouth every evening., Disp: 90 tablet, Rfl: 3;  tamsulosin (FLOMAX) 0.4 MG CAPS, Take 1 capsule (0.4 mg total) by mouth daily after supper., Disp: 90 capsule, Rfl: 3  BP 156/91  Pulse 63  Resp 18  Ht 5' 11.5" (1.816 m)  Wt 217 lb (98.431 kg)  BMI 29.85 kg/m2  Body mass index is 29.85 kg/(m^2).           Review of Systems denies chest pain, dyspnea on exertion, PND, orthopnea, hemoptysis, claudication. All other systems negative for complete review of systems    Objective:   Physical Exam BP 156/91  Pulse 63  Resp 18  Ht 5' 11.5" (1.816 m)  Wt 217 lb (98.431 kg)  BMI 29.85 kg/m2  Gen.-alert and oriented x3 in no apparent distress HEENT normal for age Lungs no rhonchi or wheezing Cardiovascular regular rhythm no murmurs carotid pulses 3+ palpable no bruits audible Abdomen soft nontender no palpable masses Musculoskeletal free of  major deformities Skin clear -no rashes Neurologic normal Lower extremities 3+ femoral and dorsalis pedis pulses palpable bilaterally with no edema  Today I ordered a carotid duplex exam which I reviewed and interpreted. Left ICA endarterectomy site is widely patent. Right ICA has approximately 40% stenosis.       Assessment:     Doing well 6 months post left carotid endarterectomy for severe asymptomatic  left ICA stenosis    Plan:     Return 6 months for continued followup with carotid duplex exam unless patient develops symptoms in the interim Continue daily aspirin

## 2013-03-28 ENCOUNTER — Encounter: Payer: Self-pay | Admitting: *Deleted

## 2013-03-28 NOTE — Addendum Note (Signed)
Addended by: Sharee Pimple on: 03/28/2013 10:21 AM   Modules accepted: Orders

## 2013-04-19 ENCOUNTER — Ambulatory Visit (INDEPENDENT_AMBULATORY_CARE_PROVIDER_SITE_OTHER): Payer: Medicare Other | Admitting: Internal Medicine

## 2013-04-19 ENCOUNTER — Other Ambulatory Visit (INDEPENDENT_AMBULATORY_CARE_PROVIDER_SITE_OTHER): Payer: 59

## 2013-04-19 ENCOUNTER — Encounter: Payer: Self-pay | Admitting: Internal Medicine

## 2013-04-19 VITALS — BP 138/72 | HR 68 | Temp 98.0°F | Resp 16 | Wt 218.8 lb

## 2013-04-19 DIAGNOSIS — IMO0001 Reserved for inherently not codable concepts without codable children: Secondary | ICD-10-CM

## 2013-04-19 DIAGNOSIS — E1165 Type 2 diabetes mellitus with hyperglycemia: Principal | ICD-10-CM

## 2013-04-19 DIAGNOSIS — Z0000 Encounter for general adult medical examination without abnormal findings: Secondary | ICD-10-CM

## 2013-04-19 DIAGNOSIS — Z23 Encounter for immunization: Secondary | ICD-10-CM

## 2013-04-19 DIAGNOSIS — F329 Major depressive disorder, single episode, unspecified: Secondary | ICD-10-CM

## 2013-04-19 DIAGNOSIS — E785 Hyperlipidemia, unspecified: Secondary | ICD-10-CM

## 2013-04-19 DIAGNOSIS — J45909 Unspecified asthma, uncomplicated: Secondary | ICD-10-CM

## 2013-04-19 DIAGNOSIS — F3289 Other specified depressive episodes: Secondary | ICD-10-CM

## 2013-04-19 LAB — BASIC METABOLIC PANEL
BUN: 16 mg/dL (ref 6–23)
CO2: 28 mEq/L (ref 19–32)
Calcium: 9.4 mg/dL (ref 8.4–10.5)
Chloride: 103 mEq/L (ref 96–112)
Creatinine, Ser: 1.3 mg/dL (ref 0.4–1.5)
GFR: 57.26 mL/min — ABNORMAL LOW (ref >60.00)
Glucose, Bld: 115 mg/dL — ABNORMAL HIGH (ref 70–99)
Potassium: 4.2 mEq/L (ref 3.5–5.1)
Sodium: 138 mEq/L (ref 135–145)

## 2013-04-19 LAB — HEPATIC FUNCTION PANEL
ALT: 25 U/L (ref 0–53)
AST: 25 U/L (ref 0–37)
Albumin: 4.2 g/dL (ref 3.5–5.2)
Alkaline Phosphatase: 47 U/L (ref 39–117)
Bilirubin, Direct: 0.2 mg/dL (ref 0.0–0.3)
Total Bilirubin: 1.1 mg/dL (ref 0.3–1.2)
Total Protein: 7.3 g/dL (ref 6.0–8.3)

## 2013-04-19 LAB — LIPID PANEL
Cholesterol: 140 mg/dL (ref 0–200)
HDL: 35.5 mg/dL — ABNORMAL LOW (ref >39.00)
LDL Cholesterol: 82 mg/dL (ref 0–99)
Total CHOL/HDL Ratio: 4
Triglycerides: 113 mg/dL (ref 0.0–149.0)
VLDL: 22.6 mg/dL (ref 0.0–40.0)

## 2013-04-19 LAB — HEMOGLOBIN A1C: Hgb A1c MFr Bld: 6.5 % (ref 4.6–6.5)

## 2013-04-19 NOTE — Progress Notes (Signed)
Subjective:    Patient ID: Sean Lozano, male    DOB: 1933-12-01, 80 y.o.   MRN: [id number]  HPI  Here to f/u; overall doing ok,  Pt denies chest pain, increased sob or doe, wheezing, orthopnea, PND, increased LE swelling, palpitations, dizziness or syncope.  Pt denies polydipsia, polyuria, or low sugar symptoms such as weakness or confusion improved with po intake.  Pt denies new neurological symptoms such as new headache, or facial or extremity weakness or numbness.   Pt states overall good compliance with meds, has been trying to follow lower cholesterol, diabetic diet, with wt overall up a few lbs.  Has seen vascular and card and overall doing ok for now. Overall good compliance with treatment, and good medicine tolerability.  Some minor hasal congestion recently. Denies worsening depressive symptoms, suicidal ideation, or panic Past Medical History  Diagnosis Date  . CAD (coronary artery disease)     a. CABG x 6 in 1992. b. s/p DES x 2 to SVG -> OM1/OM2 in 09/2010. c. s/p DES to mid LAD 10/2010. d. NSTEMI (off DAPT) s/p PTCA of instent renarrowing of DES in SVG-OM, Recs for lifelong DAPT   . Hyperlipidemia     intolerance to Crestor Zocor Vytorin. He tolerates Pravachol...low HDL  . GERD (gastroesophageal reflux disease)   . Rosacea   . Prostatitis   . Depression   . Benign prostatic hypertrophy   . Asthma   . Paresthesias     successful surgery by Dr. Earnie Larsson  . Arthritis   . Diabetes mellitus     type II-diet  . Diverticulosis   . Hx of CABG     1992  . Ejection fraction     EF 60%, echo, June, 2012  . BARRETTS ESOPHAGUS 06/02/2007  . Bladder neck obstruction 09/29/2010  . SKIN LESION 06/02/2007  . 1St degree AV block   . Sinus bradycardia     asymptomatic   . Cervical disc disease   . Subdural hematoma     Subdural hematoma secondary to falling off a ladder  April, 2014,  medical therapy,  subdural did not expand while blood levels of ddual antiplatelet therapy decreased  over one week.  Episode of chest pain in the hospital,, dual antiplatelet therapy restarted approximately one week after it was held  . Carotid bruit     April, 2014  . Syncope     The patient lost consciousness and hit his head July 09 2012, not clear yet if he had true syncope  . Carotid artery disease     Doppler, May, 2014, [id number] R. ICA, [id number] LICA, with recent syncope patient sent for consult with vascular surgery  . Fall at home July 09, 2012  . Concussion July 09, 2012    Scalp   Past Surgical History  Procedure Laterality Date  . C-spine disc surgery  03/2006  . C4-5 surgery  01/2009  . Cardiac catheterization  July 2012    stent placed  X's  4  . Appendectomy    . Tonsillectomy    . Coccyx fracture surgery  1997  . Coronary artery bypass graft  1992  . Barretts esophagus   20/20/09  . Bladder neck obstruction  09/29/10  . Endarterectomy Left 09/07/2012    Procedure: ENDARTERECTOMY CAROTID with patch angioplasty;  Surgeon: Mal Misty, MD;  Location: [facility];  Service: Vascular;  Laterality: Left;    reports that he has quit smoking. His smoking use included  Cigarettes. He started smoking about 58 years ago. He smoked 0.00 packs per day. He has never used smokeless tobacco. He reports that he drinks alcohol. He reports that he does not use illicit drugs. family history includes Aneurysm in an other family member; Diabetes in his sister; Heart disease in his father; Hyperlipidemia in an other family member; Liver cancer in an other family member. Allergies  Allergen Reactions  . Adhesive [Tape]     Breakdown skin  . Atenolol Other (See Comments)    depression  . Codeine Other (See Comments)    Crazy feeling  . Ezetimibe-Simvastatin Other (See Comments)    Severe hip pain  . Rosuvastatin Other (See Comments)    Severe hip pain  . Simvastatin Other (See Comments)    Severe hip pain  . Ultram [Tramadol] Nausea And Vomiting  . Zofran [Ondansetron Hcl]     Severe  nausea and constipation   Current Outpatient Prescriptions on File Prior to Visit  Medication Sig Dispense Refill  . aspirin (ASPIRIN EC) 81 MG EC tablet Take 81 mg by mouth daily.        . Cholecalciferol (VITAMIN D3) 2000 UNITS TABS Take 1 tablet by mouth daily.       . clopidogrel (PLAVIX) 75 MG tablet Take 1 tablet (75 mg total) by mouth daily.  90 tablet  3  . diphenhydrAMINE (BENADRYL) 25 MG tablet Take 25 mg by mouth daily as needed for allergies.       Marland Kitchen glucose blood (ONE TOUCH ULTRA TEST) test strip Use as instructed once daily to check blood sugar.  Diagnosis code 250.02  100 each  2  . loratadine (CLARITIN) 10 MG tablet Take 10 mg by mouth daily as needed for allergies.       . metoprolol succinate (TOPROL-XL) 25 MG 24 hr tablet Take 0.5 tablets (12.5 mg total) by mouth 2 (two) times daily.  90 tablet  3  . mometasone-formoterol (DULERA) 100-5 MCG/ACT AERO Inhale 2 puffs into the lungs 2 (two) times daily.  13 g  11  . montelukast (SINGULAIR) 10 MG tablet Take 1 tablet (10 mg total) by mouth at bedtime.  90 tablet  3  . niacin (NIASPAN) 1000 MG CR tablet Take 1 tablet (1,000 mg total) by mouth at bedtime. Take with aspirin  90 tablet  3  . nitroGLYCERIN (NITROLINGUAL) 0.4 MG/SPRAY spray Place 1 spray under the tongue every 5 (five) minutes as needed for chest pain.       . nitroGLYCERIN (NITROSTAT) 0.4 MG SL tablet Place 0.4 mg under the tongue every 5 (five) minutes as needed for chest pain.       . pantoprazole (PROTONIX) 40 MG tablet Take 1 tablet (40 mg total) by mouth daily.  90 tablet  3  . pravastatin (PRAVACHOL) 40 MG tablet Take 1 tablet (40 mg total) by mouth every evening.  90 tablet  3  . tamsulosin (FLOMAX) 0.4 MG CAPS Take 1 capsule (0.4 mg total) by mouth daily after supper.  90 capsule  3   No current facility-administered medications on file prior to visit.   Review of Systems  Constitutional: Negative for unexpected weight change, or unusual diaphoresis  HENT:  Negative for tinnitus.   Eyes: Negative for photophobia and visual disturbance.  Respiratory: Negative for choking and stridor.   Gastrointestinal: Negative for vomiting and blood in stool.  Genitourinary: Negative for hematuria and decreased urine volume.  Musculoskeletal: Negative for acute joint swelling Skin: Negative  for color change and wound.  Neurological: Negative for tremors and numbness other than noted  Psychiatric/Behavioral: Negative for decreased concentration or  hyperactivity.       Objective:   Physical Exam BP 138/72  Pulse 68  Temp(Src) 98 F (36.7 C) (Oral)  Resp 16  Wt 218 lb 12.8 oz (99.247 kg)  SpO2 94% VS noted,  Constitutional: Pt appears well-developed and well-nourished.  HENT: Head: NCAT.  Right Ear: External ear normal.  Left Ear: External ear normal.  Eyes: Conjunctivae and EOM are normal. Pupils are equal, round, and reactive to light.  Neck: Normal range of motion. Neck supple.  Cardiovascular: Normal rate and regular rhythm.   Pulmonary/Chest: Effort normal and breath sounds normal.  Neurological: Pt is alert. Not confused  Skin: Skin is warm. No erythema.  Psychiatric: Pt behavior is normal. Thought content normal. not depressed affect     Assessment & Plan:

## 2013-04-19 NOTE — Assessment & Plan Note (Signed)
stable overall by history and exam, recent data reviewed with pt, and pt to continue medical treatment as before,  to f/u any worsening symptoms or concerns Lab Results  Component Value Date   HGBA1C 6.5* 09/08/2012   For f/u labs

## 2013-04-19 NOTE — Assessment & Plan Note (Signed)
stable overall by history and exam, recent data reviewed with pt, and pt to continue medical treatment as before,  to f/u any worsening symptoms or concerns Lab Results  Component Value Date   LDLCALC 82 10/24/2012

## 2013-04-19 NOTE — Assessment & Plan Note (Signed)
stable overall by history and exam, recent data reviewed with pt, and pt to continue medical treatment as before,  to f/u any worsening symptoms or concerns Lab Results  Component Value Date   WBC 11.6* 09/08/2012   HGB 14.1 09/08/2012   HCT 40.2 09/08/2012   PLT 153 09/08/2012   GLUCOSE 115* 09/08/2012   CHOL 136 10/24/2012   TRIG 80.0 10/24/2012   HDL 37.70* 10/24/2012   LDLDIRECT 87.6 09/19/2008   LDLCALC 82 10/24/2012   ALT 16 09/05/2012   AST 25 09/05/2012   NA 137 09/08/2012   K 3.8 09/08/2012   CL 104 09/08/2012   CREATININE 0.92 09/08/2012   BUN 9 09/08/2012   CO2 24 09/08/2012   TSH 2.13 10/24/2012   PSA 1.51 10/24/2012   INR 1.01 09/05/2012   HGBA1C 6.5* 09/08/2012

## 2013-04-19 NOTE — Assessment & Plan Note (Signed)
SpO2 Readings from Last 3 Encounters:  04/19/13 94%  03/23/13 93%  10/24/12 94%   stable overall by history and exam, recent data reviewed with pt, and pt to continue medical treatment as before,  to f/u any worsening symptoms or concerns

## 2013-04-19 NOTE — Patient Instructions (Signed)
You had the flu shot today Please continue all other medications as before, and refills have been done if requested. Please have the pharmacy call with any other refills you may need.  Please go to the LAB in the Basement (turn left off the elevator) for the tests to be done today You will be contacted by phone if any changes need to be made immediately.  Otherwise, you will receive a letter about your results with an explanation, but please check with MyChart first.  Please remember to sign up for My Chart if you have not done so, as this will be important to you in the future with finding out test results, communicating by private email, and scheduling acute appointments online when needed.  Please return in 6 months, or sooner if needed, with Lab testing done 3-5 days before

## 2013-08-23 ENCOUNTER — Other Ambulatory Visit: Payer: Self-pay | Admitting: Internal Medicine

## 2013-09-18 ENCOUNTER — Encounter: Payer: Self-pay | Admitting: Cardiology

## 2013-09-19 ENCOUNTER — Encounter: Payer: Self-pay | Admitting: Cardiology

## 2013-09-19 ENCOUNTER — Ambulatory Visit (INDEPENDENT_AMBULATORY_CARE_PROVIDER_SITE_OTHER): Payer: Medicare Other | Admitting: Cardiology

## 2013-09-19 VITALS — BP 127/74 | HR 69 | Ht 71.5 in | Wt 219.6 lb

## 2013-09-19 DIAGNOSIS — I739 Peripheral vascular disease, unspecified: Secondary | ICD-10-CM

## 2013-09-19 DIAGNOSIS — E785 Hyperlipidemia, unspecified: Secondary | ICD-10-CM

## 2013-09-19 DIAGNOSIS — I2510 Atherosclerotic heart disease of native coronary artery without angina pectoris: Secondary | ICD-10-CM

## 2013-09-19 DIAGNOSIS — I779 Disorder of arteries and arterioles, unspecified: Secondary | ICD-10-CM

## 2013-09-19 DIAGNOSIS — M25569 Pain in unspecified knee: Secondary | ICD-10-CM

## 2013-09-19 NOTE — Progress Notes (Signed)
Patient ID: Sean Lozano, male   DOB: 27-Feb-1934, 80 y.o.   MRN: [id number]    HPI  Patient returns today for follow up of his coronary disease. I saw him last in December, 2014. He is not having any recurrent symptoms. Prior to the last visit he had fallen and had a subdural. He had a tight carotid stenosis. He had carotid surgery while on aspirin products and he did well. We do everything we can to be sure his Plavix and aspirin are not stopped. He's been having some knee discomfort. He had an injection a year ago. He's had return of symptoms. At this point he and I both agree that it would not be appropriate to stop his Plavix to try to have knee surgery. The surgery could not be done on his Plavix.  Allergies  Allergen Reactions  . Adhesive [Tape]     Breakdown skin  . Atenolol Other (See Comments)    depression  . Codeine Other (See Comments)    Crazy feeling  . Ezetimibe-Simvastatin Other (See Comments)    Severe hip pain  . Rosuvastatin Other (See Comments)    Severe hip pain  . Simvastatin Other (See Comments)    Severe hip pain  . Ultram [Tramadol] Nausea And Vomiting  . Zofran [Ondansetron Hcl]     Severe nausea and constipation    Current Outpatient Prescriptions  Medication Sig Dispense Refill  . aspirin (ASPIRIN EC) 81 MG EC tablet Take 81 mg by mouth daily.        . Cholecalciferol (VITAMIN D3) 2000 UNITS TABS Take 1 tablet by mouth daily.       . clopidogrel (PLAVIX) 75 MG tablet Take 1 tablet (75 mg total) by mouth daily.  90 tablet  3  . diphenhydrAMINE (BENADRYL) 25 MG tablet Take 25 mg by mouth daily as needed for allergies.       . DULERA 100-5 MCG/ACT AERO USE 2 PUFFS TWICE A DAY  16 g  11  . Fluticasone-Salmeterol (ADVAIR HFA IN) Inhale into the lungs as directed.      Marland Kitchen glucose blood (ONE TOUCH ULTRA TEST) test strip Use as instructed once daily to check blood sugar.  Diagnosis code 250.02  100 each  2  . metoprolol succinate (TOPROL-XL) 25 MG 24 hr tablet  Take 0.5 tablets (12.5 mg total) by mouth 2 (two) times daily.  90 tablet  3  . montelukast (SINGULAIR) 10 MG tablet Take 1 tablet (10 mg total) by mouth at bedtime.  90 tablet  3  . niacin (NIASPAN) 1000 MG CR tablet Take 1 tablet (1,000 mg total) by mouth at bedtime. Take with aspirin  90 tablet  3  . nitroGLYCERIN (NITROLINGUAL) 0.4 MG/SPRAY spray Place 1 spray under the tongue every 5 (five) minutes as needed for chest pain.       . nitroGLYCERIN (NITROSTAT) 0.4 MG SL tablet Place 0.4 mg under the tongue every 5 (five) minutes as needed for chest pain.       . pantoprazole (PROTONIX) 40 MG tablet Take 1 tablet (40 mg total) by mouth daily.  90 tablet  3  . pravastatin (PRAVACHOL) 40 MG tablet Take 1 tablet (40 mg total) by mouth every evening.  90 tablet  3  . tamsulosin (FLOMAX) 0.4 MG CAPS Take 1 capsule (0.4 mg total) by mouth daily after supper.  90 capsule  3   No current facility-administered medications for this visit.    History  Social History  . Marital Status: Married    Spouse Name: N/A    Number of Children: 3  . Years of Education: N/A   Occupational History  . retired-real estate    Social History Main Topics  . Smoking status: Former Smoker    Types: Cigarettes    Start date: 01/03/1955  . Smokeless tobacco: Never Used  . Alcohol Use: Yes     Comment: occ  . Drug Use: No  . Sexual Activity: Not on file   Other Topics Concern  . Not on file   Social History Narrative   Pt has 70yo son-paranoid schizophrenic, unempolyed, lives with him.    Family History  Problem Relation Age of Onset  . Heart disease Father     mother  . Aneurysm    . Hyperlipidemia    . Liver cancer      grandfather  . Diabetes Sister     mother    Past Medical History  Diagnosis Date  . CAD (coronary artery disease)     a. CABG x 6 in 1992. b. s/p DES x 2 to SVG -> OM1/OM2 in 09/2010. c. s/p DES to mid LAD 10/2010. d. NSTEMI (off DAPT) s/p PTCA of instent renarrowing of DES  in SVG-OM, Recs for lifelong DAPT   . Hyperlipidemia     intolerance to Crestor Zocor Vytorin. He tolerates Pravachol...low HDL  . GERD (gastroesophageal reflux disease)   . Rosacea   . Prostatitis   . Depression   . Benign prostatic hypertrophy   . Asthma   . Paresthesias     successful surgery by Dr. Earnie Larsson  . Arthritis   . Diabetes mellitus     type II-diet  . Diverticulosis   . Hx of CABG     1992  . Ejection fraction     EF 60%, echo, June, 2012  . BARRETTS ESOPHAGUS 06/02/2007  . Bladder neck obstruction 09/29/2010  . SKIN LESION 06/02/2007  . 1St degree AV block   . Sinus bradycardia     asymptomatic   . Cervical disc disease   . Subdural hematoma     Subdural hematoma secondary to falling off a ladder  April, 2014,  medical therapy,  subdural did not expand while blood levels of ddual antiplatelet therapy decreased over one week.  Episode of chest pain in the hospital,, dual antiplatelet therapy restarted approximately one week after it was held  . Carotid bruit     April, 2014  . Syncope     The patient lost consciousness and hit his head July 09 2012, not clear yet if he had true syncope  . Carotid artery disease     Doppler, May, 2014, 9-38% R. ICA, 18-29% LICA, with recent syncope patient sent for consult with vascular surgery  . Fall at home July 09, 2012  . Concussion July 09, 2012    Scalp    Past Surgical History  Procedure Laterality Date  . C-spine disc surgery  03/2006  . C4-5 surgery  01/2009  . Cardiac catheterization  July 2012    stent placed  X's  4  . Appendectomy    . Tonsillectomy    . Coccyx fracture surgery  1997  . Coronary artery bypass graft  1992  . Barretts esophagus   20/20/09  . Bladder neck obstruction  09/29/10  . Endarterectomy Left 09/07/2012    Procedure: ENDARTERECTOMY CAROTID with patch angioplasty;  Surgeon: Mal Misty, MD;  Location: [facility];  Service: Vascular;  Laterality: Left;    Patient Active Problem List    Diagnosis Date Noted  . Knee pain 09/19/2013  . Occlusion and stenosis of carotid artery without mention of cerebral infarction 08/22/2012  . Carotid artery disease   . Syncope   . Subdural hematoma   . Chronic diastolic congestive heart failure, NYHA class 1/ECHO 2010 07/10/2012  . SDH (subdural hematoma) 07/09/2012  . Erectile dysfunction 04/01/2011  . Ejection fraction   . CAD (coronary artery disease)   . Hyperlipidemia   . Type II or unspecified type diabetes mellitus without mention of complication, uncontrolled   . Hx of CABG   . Preventative health care 09/29/2010  . Dyspnea 09/29/2010  . Bladder neck obstruction 09/29/2010  . DEPRESSION 06/04/2007  . ASTHMA 06/04/2007  . GERD 06/04/2007  . BENIGN PROSTATIC HYPERTROPHY 06/04/2007  . BARRETTS ESOPHAGUS 06/02/2007  . Rosacea 06/02/2007  . SKIN LESION 06/02/2007  . FATIGUE 06/02/2007    ROS   Patient denies fever, chills, headache, sweats, rash, change in vision, change in hearing, chest pain, cough, nausea or vomiting, urinary symptoms. All other systems are reviewed and are negative other than the history of present illness.  PHYSICAL EXAM  He looks good. He is oriented to person time and place. Affect is normal. Head is atraumatic. Sclera and conjunctiva are normal. There is no jugulovenous distention. Lungs are clear. Respiratory effort is nonlabored. Cardiac exam reveals S1 and S2. Abdomen is soft. It is no peripheral edema.  Filed Vitals:   09/19/13 0857  BP: 127/74  Pulse: 69  Height: 5' 11.5" (1.816 m)  Weight: 219 lb 9.6 oz (99.61 kg)   EKG is done today and reviewed by me. He has normal sinus rhythm with first-degree AV block. There is no change.  ASSESSMENT & PLAN

## 2013-09-19 NOTE — Assessment & Plan Note (Signed)
He will followup with Dr. Kellie Simmering concerning his post endarterectomy state.

## 2013-09-19 NOTE — Assessment & Plan Note (Signed)
Coronary disease is stable. Plan to keep him on aspirin and Plavix indefinitely.

## 2013-09-19 NOTE — Patient Instructions (Signed)
**Note De-identified  Obfuscation** Your physician recommends that you continue on your current medications as directed. Please refer to the Current Medication list given to you today.  Your physician wants you to follow-up in: 6 months. You will receive a reminder letter in the mail two months in advance. If you don't receive a letter, please call our office to schedule the follow-up appointment.  

## 2013-09-19 NOTE — Assessment & Plan Note (Signed)
For now we will plan no invasive procedures concerning his knee.

## 2013-09-19 NOTE — Assessment & Plan Note (Signed)
We have once again discussed all of the statins. He wants to remain on his Pravachol.

## 2013-09-24 ENCOUNTER — Encounter: Payer: Self-pay | Admitting: Family

## 2013-09-25 ENCOUNTER — Ambulatory Visit (INDEPENDENT_AMBULATORY_CARE_PROVIDER_SITE_OTHER): Payer: Medicare Other | Admitting: Family

## 2013-09-25 ENCOUNTER — Ambulatory Visit (HOSPITAL_COMMUNITY)
Admission: RE | Admit: 2013-09-25 | Discharge: 2013-09-25 | Disposition: A | Discharge status: Home / Self Care | Payer: Medicare Other | Source: Ambulatory Visit | Attending: Family | Admitting: Family

## 2013-09-25 DIAGNOSIS — I6529 Occlusion and stenosis of unspecified carotid artery: Secondary | ICD-10-CM | POA: Insufficient documentation

## 2013-09-25 DIAGNOSIS — I658 Occlusion and stenosis of other precerebral arteries: Secondary | ICD-10-CM | POA: Insufficient documentation

## 2013-10-09 ENCOUNTER — Other Ambulatory Visit: Payer: Self-pay

## 2013-10-09 ENCOUNTER — Encounter: Payer: Self-pay | Admitting: Family

## 2013-10-09 DIAGNOSIS — I6529 Occlusion and stenosis of unspecified carotid artery: Secondary | ICD-10-CM

## 2013-10-09 DIAGNOSIS — Z48812 Encounter for surgical aftercare following surgery on the circulatory system: Secondary | ICD-10-CM

## 2013-10-15 ENCOUNTER — Encounter: Payer: Self-pay | Admitting: Vascular Surgery

## 2013-10-17 ENCOUNTER — Other Ambulatory Visit: Payer: Self-pay | Admitting: Cardiology

## 2013-10-23 ENCOUNTER — Other Ambulatory Visit: Payer: Self-pay | Admitting: Cardiology

## 2013-10-25 ENCOUNTER — Encounter: Payer: 59 | Admitting: Internal Medicine

## 2013-10-25 ENCOUNTER — Other Ambulatory Visit: Payer: Self-pay | Admitting: Cardiology

## 2013-10-28 ENCOUNTER — Other Ambulatory Visit: Payer: Self-pay | Admitting: Cardiology

## 2013-10-29 NOTE — Telephone Encounter (Signed)
Called [facility] ([address]) and verified they received refill already sent in. Pharmacist stated they did and will let pt know medication is ready for pickup.   Approved        Disp Refills Start End      pantoprazole (PROTONIX) 40 MG tablet 90 tablet 1          Sig:  TAKE 1 TABLET (40 MG TOTAL) BY MOUTH DAILY.      Class:  Normal      DAW:  No      Authorizing Provider:  Carlena Bjornstad, MD      Ordering User:  Juventino Slovak, CMA

## 2013-10-30 ENCOUNTER — Other Ambulatory Visit: Payer: Self-pay | Admitting: Cardiology

## 2013-11-06 ENCOUNTER — Other Ambulatory Visit: Payer: Self-pay | Admitting: Internal Medicine

## 2013-11-13 ENCOUNTER — Encounter: Payer: Self-pay | Admitting: Internal Medicine

## 2013-11-20 ENCOUNTER — Encounter: Payer: Self-pay | Admitting: Internal Medicine

## 2013-11-20 ENCOUNTER — Other Ambulatory Visit (INDEPENDENT_AMBULATORY_CARE_PROVIDER_SITE_OTHER): Payer: Medicare Other

## 2013-11-20 ENCOUNTER — Ambulatory Visit (INDEPENDENT_AMBULATORY_CARE_PROVIDER_SITE_OTHER): Payer: Medicare Other | Admitting: Internal Medicine

## 2013-11-20 VITALS — BP 116/80 | HR 68 | Temp 97.8°F | Ht 72.0 in | Wt 216.8 lb

## 2013-11-20 DIAGNOSIS — Z23 Encounter for immunization: Secondary | ICD-10-CM

## 2013-11-20 DIAGNOSIS — Z0000 Encounter for general adult medical examination without abnormal findings: Secondary | ICD-10-CM

## 2013-11-20 DIAGNOSIS — E785 Hyperlipidemia, unspecified: Secondary | ICD-10-CM

## 2013-11-20 DIAGNOSIS — Z125 Encounter for screening for malignant neoplasm of prostate: Secondary | ICD-10-CM

## 2013-11-20 DIAGNOSIS — IMO0001 Reserved for inherently not codable concepts without codable children: Secondary | ICD-10-CM

## 2013-11-20 DIAGNOSIS — E1165 Type 2 diabetes mellitus with hyperglycemia: Secondary | ICD-10-CM

## 2013-11-20 LAB — BASIC METABOLIC PANEL
BUN: 15 mg/dL (ref 6–23)
CO2: 27 mEq/L (ref 19–32)
Calcium: 9.5 mg/dL (ref 8.4–10.5)
Chloride: 101 mEq/L (ref 96–112)
Creatinine, Ser: 1.3 mg/dL (ref 0.4–1.5)
GFR: 57.17 mL/min — ABNORMAL LOW (ref >60.00)
Glucose, Bld: 116 mg/dL — ABNORMAL HIGH (ref 70–99)
Potassium: 4.2 mEq/L (ref 3.5–5.1)
Sodium: 137 mEq/L (ref 135–145)

## 2013-11-20 LAB — URINALYSIS, ROUTINE W REFLEX MICROSCOPIC
Bilirubin Urine: NEGATIVE
Hgb urine dipstick: NEGATIVE
Ketones, ur: NEGATIVE
Leukocytes, UA: NEGATIVE
Nitrite: NEGATIVE
RBC / HPF: NONE SEEN (ref 0–?)
Specific Gravity, Urine: 1.01 (ref 1.000–1.030)
Total Protein, Urine: NEGATIVE
Urine Glucose: NEGATIVE
Urobilinogen, UA: 0.2 (ref 0.0–1.0)
WBC, UA: NONE SEEN (ref 0–?)
pH: 6.5 (ref 5.0–8.0)

## 2013-11-20 LAB — TSH: TSH: 3.69 u[IU]/mL (ref 0.35–4.50)

## 2013-11-20 LAB — CBC WITH DIFFERENTIAL/PLATELET
Basophils Absolute: 0 10*3/uL (ref 0.0–0.1)
Basophils Relative: 0.3 % (ref 0.0–3.0)
Eosinophils Absolute: 0.3 10*3/uL (ref 0.0–0.7)
Eosinophils Relative: 2.7 % (ref 0.0–5.0)
HCT: 49.8 % (ref 39.0–52.0)
Hemoglobin: 16.8 g/dL (ref 13.0–17.0)
Lymphocytes Relative: 34.9 % (ref 12.0–46.0)
Lymphs Abs: 3.8 10*3/uL (ref 0.7–4.0)
MCHC: 33.8 g/dL (ref 30.0–36.0)
MCV: 100.4 fl — ABNORMAL HIGH (ref 78.0–100.0)
Monocytes Absolute: 0.7 10*3/uL (ref 0.1–1.0)
Monocytes Relative: 6.5 % (ref 3.0–12.0)
Neutro Abs: 6.1 10*3/uL (ref 1.4–7.7)
Neutrophils Relative %: 55.6 % (ref 43.0–77.0)
Platelets: 177 10*3/uL (ref 150.0–400.0)
RBC: 4.96 Mil/uL (ref 4.22–5.81)
RDW: 13.4 % (ref 11.5–15.5)
WBC: 10.9 10*3/uL — ABNORMAL HIGH (ref 4.0–10.5)

## 2013-11-20 LAB — HEPATIC FUNCTION PANEL
ALT: 27 U/L (ref 0–53)
AST: 24 U/L (ref 0–37)
Albumin: 4.2 g/dL (ref 3.5–5.2)
Alkaline Phosphatase: 56 U/L (ref 39–117)
Bilirubin, Direct: 0.2 mg/dL (ref 0.0–0.3)
Total Bilirubin: 1.2 mg/dL (ref 0.2–1.2)
Total Protein: 7.4 g/dL (ref 6.0–8.3)

## 2013-11-20 LAB — LIPID PANEL
Cholesterol: 141 mg/dL (ref 0–200)
HDL: 39.7 mg/dL (ref >39.00)
LDL Cholesterol: 83 mg/dL (ref 0–99)
NonHDL: 101.3
Total CHOL/HDL Ratio: 4
Triglycerides: 91 mg/dL (ref 0.0–149.0)
VLDL: 18.2 mg/dL (ref 0.0–40.0)

## 2013-11-20 LAB — MICROALBUMIN / CREATININE URINE RATIO
Creatinine,U: 148.5 mg/dL
Microalb Creat Ratio: 0.5 mg/g (ref 0.0–30.0)
Microalb, Ur: 0.7 mg/dL (ref 0.0–1.9)

## 2013-11-20 LAB — PSA: PSA: 1.47 ng/mL (ref 0.10–4.00)

## 2013-11-20 LAB — HEMOGLOBIN A1C: Hgb A1c MFr Bld: 6.8 % — ABNORMAL HIGH (ref 4.6–6.5)

## 2013-11-20 NOTE — Progress Notes (Signed)
Subjective:    Patient ID: Sean Lozano, male    DOB: 06-20-1933, 80 y.o.   MRN: [id number]  HPI  Here for wellness and f/u;  Overall doing ok;  Pt denies CP, worsening SOB, DOE, wheezing, orthopnea, PND, worsening LE edema, palpitations, dizziness or syncope.  Pt denies neurological change such as new headache, facial or extremity weakness.  Pt denies polydipsia, polyuria, or low sugar symptoms. Pt states overall good compliance with treatment and medications, good tolerability, and has been trying to follow lower cholesterol diet.  Pt denies worsening depressive symptoms, suicidal ideation or panic. No fever, night sweats, wt loss, loss of appetite, or other constitutional symptoms.  Pt states good ability with ADL's, has low fall risk, home safety reviewed and adequate, no other significant changes in hearing or vision, and only occasionally active with exercise.  Son died 2022/10/18, ward of the state, nonhealthy lifestyle, leg gangrene.   Sees Dr Christophe Louis for left knee approx 1 per yr, seems to help pain radiating to the left hip.  Due for f/u carotid studies about dec 2015, s/p left cea per Dr lawson/vasc surg,, Pt denies new neurological symptoms such as new headache, or facial or extremity weakness or numbness other than above.  Wife also with worsening dementia, will need assisted living soon, for now he is primary caretaker.   Past Medical History  Diagnosis Date  . CAD (coronary artery disease)     a. CABG x 6 in 1992. b. s/p DES x 2 to SVG -> OM1/OM2 in 09/2010. c. s/p DES to mid LAD 10/2010. d. NSTEMI (off DAPT) s/p PTCA of instent renarrowing of DES in SVG-OM, Recs for lifelong DAPT   . Hyperlipidemia     intolerance to Crestor Zocor Vytorin. He tolerates Pravachol...low HDL  . GERD (gastroesophageal reflux disease)   . Rosacea   . Prostatitis   . Depression   . Benign prostatic hypertrophy   . Asthma   . Paresthesias     successful surgery by Dr. Earnie Larsson  . Arthritis   .  Diabetes mellitus     type II-diet  . Diverticulosis   . Hx of CABG     1992  . Ejection fraction     EF 60%, echo, June, 2012  . BARRETTS ESOPHAGUS 06/02/2007  . Bladder neck obstruction 09/29/2010  . SKIN LESION 06/02/2007  . 1St degree AV block   . Sinus bradycardia     asymptomatic   . Cervical disc disease   . Subdural hematoma     Subdural hematoma secondary to falling off a ladder  April, 2014,  medical therapy,  subdural did not expand while blood levels of ddual antiplatelet therapy decreased over one week.  Episode of chest pain in the hospital,, dual antiplatelet therapy restarted approximately one week after it was held  . Carotid bruit     April, 2014  . Syncope     The patient lost consciousness and hit his head July 09 2012, not clear yet if he had true syncope  . Carotid artery disease     Doppler, May, 2014, 9-67% R. ICA, 89-38% LICA, with recent syncope patient sent for consult with vascular surgery  . Fall at home July 09, 2012  . Concussion July 09, 2012    Scalp   Past Surgical History  Procedure Laterality Date  . C-spine disc surgery  03/2006  . C4-5 surgery  01/2009  . Cardiac catheterization  July 2012  stent placed  X's  4  . Appendectomy    . Tonsillectomy    . Coccyx fracture surgery  1997  . Coronary artery bypass graft  1992  . Barretts esophagus   20/20/09  . Bladder neck obstruction  09/29/10  . Endarterectomy Left 09/07/2012    Procedure: ENDARTERECTOMY CAROTID with patch angioplasty;  Surgeon: Mal Misty, MD;  Location: [facility];  Service: Vascular;  Laterality: Left;    reports that he has quit smoking. His smoking use included Cigarettes. He started smoking about 58 years ago. He smoked 0.00 packs per day. He has never used smokeless tobacco. He reports that he drinks alcohol. He reports that he does not use illicit drugs. family history includes Aneurysm in an other family member; Diabetes in his sister; Heart disease in his father;  Hyperlipidemia in an other family member; Liver cancer in an other family member. Allergies  Allergen Reactions  . Adhesive [Tape]     Breakdown skin  . Atenolol Other (See Comments)    depression  . Codeine Other (See Comments)    Crazy feeling  . Ezetimibe-Simvastatin Other (See Comments)    Severe hip pain  . Rosuvastatin Other (See Comments)    Severe hip pain  . Simvastatin Other (See Comments)    Severe hip pain  . Ultram [Tramadol] Nausea And Vomiting  . Zofran [Ondansetron Hcl]     Severe nausea and constipation   Current Outpatient Prescriptions on File Prior to Visit  Medication Sig Dispense Refill  . aspirin (ASPIRIN EC) 81 MG EC tablet Take 81 mg by mouth daily.        . Cholecalciferol (VITAMIN D3) 2000 UNITS TABS Take 1 tablet by mouth daily.       . clopidogrel (PLAVIX) 75 MG tablet TAKE 1 TABLET (75 MG TOTAL) BY MOUTH DAILY.  90 tablet  1  . diphenhydrAMINE (BENADRYL) 25 MG tablet Take 25 mg by mouth daily as needed for allergies.       . DULERA 100-5 MCG/ACT AERO USE 2 PUFFS TWICE A DAY  16 g  11  . Fluticasone-Salmeterol (ADVAIR HFA IN) Inhale into the lungs as directed.      Marland Kitchen glucose blood (ONE TOUCH ULTRA TEST) test strip Use as instructed once daily to check blood sugar.  Diagnosis code 250.02  100 each  2  . metoprolol succinate (TOPROL-XL) 25 MG 24 hr tablet TAKE 1/2 TABLET BY MOUTH TWICE A DAY  90 tablet  3  . montelukast (SINGULAIR) 10 MG tablet Take 1 tablet (10 mg total) by mouth at bedtime.  90 tablet  3  . montelukast (SINGULAIR) 10 MG tablet TAKE 1 TABLET (10 MG TOTAL) BY MOUTH AT BEDTIME.  90 tablet  2  . niacin (NIASPAN) 1000 MG CR tablet TAKE 1 TABLET (1,000 MG TOTAL) BY MOUTH AT BEDTIME. TAKE WITH ASPIRIN  90 tablet  1  . nitroGLYCERIN (NITROLINGUAL) 0.4 MG/SPRAY spray Place 1 spray under the tongue every 5 (five) minutes as needed for chest pain.       . nitroGLYCERIN (NITROSTAT) 0.4 MG SL tablet Place 0.4 mg under the tongue every 5 (five) minutes  as needed for chest pain.       . pantoprazole (PROTONIX) 40 MG tablet Take 1 tablet (40 mg total) by mouth daily.  90 tablet  3  . pantoprazole (PROTONIX) 40 MG tablet TAKE 1 TABLET (40 MG TOTAL) BY MOUTH DAILY.  90 tablet  1  . pravastatin (PRAVACHOL)  40 MG tablet TAKE 1 TABLET (40 MG TOTAL) BY MOUTH EVERY EVENING.  90 tablet  1  . tamsulosin (FLOMAX) 0.4 MG CAPS capsule TAKE 1 CAPSULE (0.4 MG TOTAL) BY MOUTH DAILY AFTER SUPPER.  90 capsule  2  . tamsulosin (FLOMAX) 0.4 MG CAPS Take 1 capsule (0.4 mg total) by mouth daily after supper.  90 capsule  3   No current facility-administered medications on file prior to visit.    Review of Systems Constitutional: Negative for increased diaphoresis, other activity, appetite or other siginficant weight change  HENT: Negative for worsening hearing loss, ear pain, facial swelling, mouth sores and neck stiffness.   Eyes: Negative for other worsening pain, redness or visual disturbance.  Respiratory: Negative for shortness of breath and wheezing.   Cardiovascular: Negative for chest pain and palpitations.  Gastrointestinal: Negative for diarrhea, blood in stool, abdominal distention or other pain Genitourinary: Negative for hematuria, flank pain or change in urine volume.  Musculoskeletal: Negative for myalgias or other joint complaints.  Skin: Negative for color change and wound.  Neurological: Negative for syncope and numbness. other than noted Hematological: Negative for adenopathy. or other swelling Psychiatric/Behavioral: Negative for hallucinations, self-injury, decreased concentration or other worsening agitation.      Objective:   Physical Exam BP 116/80  Pulse 68  Temp(Src) 97.8 F (36.6 C) (Oral)  Ht 6' (1.829 m)  Wt 216 lb 12 oz (98.317 kg)  BMI 29.39 kg/m2  SpO2 93% VS noted,  Constitutional: Pt is oriented to person, place, and time. Appears well-developed and well-nourished.  Head: Normocephalic and atraumatic.  Right Ear:  External ear normal.  Left Ear: External ear normal.  Nose: Nose normal.  Mouth/Throat: Oropharynx is clear and moist.  Eyes: Conjunctivae and EOM are normal. Pupils are equal, round, and reactive to light.  Neck: Normal range of motion. Neck supple. No JVD present. No tracheal deviation present.  Cardiovascular: Normal rate, regular rhythm, normal heart sounds and intact distal pulses.   Pulmonary/Chest: Effort normal and breath sounds without rales or wheezing  Abdominal: Soft. Bowel sounds are normal. NT. No HSM  Musculoskeletal: Normal range of motion. Exhibits no edema.  Lymphadenopathy:  Has no cervical adenopathy.  Neurological: Pt is alert and oriented to person, place, and time. Pt has normal reflexes. No cranial nerve deficit. Motor grossly intact Skin: Skin is warm and dry. No rash noted.  Psychiatric:  Has normal mood and affect. Behavior is normal.     Assessment & Plan:

## 2013-11-20 NOTE — Progress Notes (Signed)
Pre visit review using our clinic review tool, if applicable. No additional management support is needed unless otherwise documented below in the visit note. 

## 2013-11-20 NOTE — Patient Instructions (Addendum)
You had the new Prevnar pneumonia shot today  You are given the prescription for the shingles shot, to take to [facility] as it is less expensive  Please continue all other medications as before, and refills have been done if requested.  Please have the pharmacy call with any other refills you may need.  Please continue your efforts at being more active, low cholesterol diet, and weight control.  You are otherwise up to date with prevention measures today.  Please keep your appointments with your specialists as you may have planned  Your blood work was already drawn today  You will be contacted by phone if any changes need to be made immediately.  Otherwise, you will receive a letter about your results with an explanation, but please check with MyChart first  Please remember to sign up for MyChart if you have not done so, as this will be important to you in the future with finding out test results, communicating by private email, and scheduling acute appointments online when needed.  Please return in 6 months, or sooner if needed, with Lab testing done 3-5 days before

## 2013-11-25 NOTE — Assessment & Plan Note (Signed)

## 2013-11-25 NOTE — Assessment & Plan Note (Signed)
stable overall by history and exam, recent data reviewed with pt, and pt to continue medical treatment as before,  to f/u any worsening symptoms or concerns Lab Results  Component Value Date   HGBA1C 6.8* 11/20/2013

## 2013-11-25 NOTE — Assessment & Plan Note (Signed)
stable overall by history and exam, recent data reviewed with pt, and pt to continue medical treatment as before,  to f/u any worsening symptoms or concerns Lab Results  Component Value Date   LDLCALC 83 11/20/2013

## 2014-01-25 ENCOUNTER — Other Ambulatory Visit: Payer: Self-pay

## 2014-03-21 ENCOUNTER — Encounter (HOSPITAL_COMMUNITY): Payer: Self-pay | Admitting: Cardiology

## 2014-04-10 ENCOUNTER — Other Ambulatory Visit: Payer: Self-pay | Admitting: Cardiology

## 2014-04-13 ENCOUNTER — Other Ambulatory Visit: Payer: Self-pay | Admitting: Cardiology

## 2014-04-18 ENCOUNTER — Other Ambulatory Visit: Payer: Self-pay | Admitting: Cardiology

## 2014-04-20 ENCOUNTER — Other Ambulatory Visit: Payer: Self-pay | Admitting: Cardiology

## 2014-05-23 ENCOUNTER — Encounter: Payer: Self-pay | Admitting: Internal Medicine

## 2014-05-23 ENCOUNTER — Ambulatory Visit (INDEPENDENT_AMBULATORY_CARE_PROVIDER_SITE_OTHER): Payer: Medicare Other | Admitting: Internal Medicine

## 2014-05-23 ENCOUNTER — Ambulatory Visit: Payer: Medicare Other | Admitting: Internal Medicine

## 2014-05-23 ENCOUNTER — Other Ambulatory Visit (INDEPENDENT_AMBULATORY_CARE_PROVIDER_SITE_OTHER): Payer: Medicare Other

## 2014-05-23 VITALS — BP 170/92 | HR 69 | Temp 97.9°F | Ht 72.0 in | Wt 222.0 lb

## 2014-05-23 DIAGNOSIS — E119 Type 2 diabetes mellitus without complications: Secondary | ICD-10-CM

## 2014-05-23 DIAGNOSIS — Z0000 Encounter for general adult medical examination without abnormal findings: Secondary | ICD-10-CM

## 2014-05-23 DIAGNOSIS — F32A Depression, unspecified: Secondary | ICD-10-CM

## 2014-05-23 DIAGNOSIS — F329 Major depressive disorder, single episode, unspecified: Secondary | ICD-10-CM

## 2014-05-23 DIAGNOSIS — E785 Hyperlipidemia, unspecified: Secondary | ICD-10-CM

## 2014-05-23 DIAGNOSIS — Z0189 Encounter for other specified special examinations: Secondary | ICD-10-CM

## 2014-05-23 LAB — HEPATIC FUNCTION PANEL
ALT: 18 U/L (ref 0–53)
AST: 20 U/L (ref 0–37)
Albumin: 4.1 g/dL (ref 3.5–5.2)
Alkaline Phosphatase: 54 U/L (ref 39–117)
Bilirubin, Direct: 0.2 mg/dL (ref 0.0–0.3)
Total Bilirubin: 0.8 mg/dL (ref 0.2–1.2)
Total Protein: 7.2 g/dL (ref 6.0–8.3)

## 2014-05-23 LAB — BASIC METABOLIC PANEL
BUN: 20 mg/dL (ref 6–23)
CO2: 28 mEq/L (ref 19–32)
Calcium: 9.4 mg/dL (ref 8.4–10.5)
Chloride: 104 mEq/L (ref 96–112)
Creatinine, Ser: 1.15 mg/dL (ref 0.40–1.50)
GFR: 65.19 mL/min (ref >60.00)
Glucose, Bld: 104 mg/dL — ABNORMAL HIGH (ref 70–99)
Potassium: 4.2 mEq/L (ref 3.5–5.1)
Sodium: 138 mEq/L (ref 135–145)

## 2014-05-23 LAB — LIPID PANEL
Cholesterol: 123 mg/dL (ref 0–200)
HDL: 33 mg/dL — ABNORMAL LOW (ref >39.00)
LDL Cholesterol: 57 mg/dL (ref 0–99)
NonHDL: 90
Total CHOL/HDL Ratio: 4
Triglycerides: 164 mg/dL — ABNORMAL HIGH (ref 0.0–149.0)
VLDL: 32.8 mg/dL (ref 0.0–40.0)

## 2014-05-23 LAB — MICROALBUMIN / CREATININE URINE RATIO
Creatinine,U: 120.7 mg/dL
Microalb Creat Ratio: 0.6 mg/g (ref 0.0–30.0)
Microalb, Ur: <0.7 mg/dL (ref 0.0–1.9)

## 2014-05-23 LAB — HEMOGLOBIN A1C: Hgb A1c MFr Bld: 6.9 % — ABNORMAL HIGH (ref 4.6–6.5)

## 2014-05-23 NOTE — Progress Notes (Signed)
Pre visit review using our clinic review tool, if applicable. No additional management support is needed unless otherwise documented below in the visit note. 

## 2014-05-23 NOTE — Progress Notes (Signed)
Subjective:    Patient ID: Sean Lozano, male    DOB: 05-Mar-1934, 81 y.o.   MRN: [id number]  HPI  Here to f/u; overall doing ok,  Pt denies chest pain, increased sob or doe, wheezing, orthopnea, PND, increased LE swelling, palpitations, dizziness or syncope.  Pt denies polydipsia, polyuria, or low sugar symptoms such as weakness or confusion improved with po intake.  Pt denies new neurological symptoms such as new headache, or facial or extremity weakness or numbness.   Pt states overall good compliance with meds, has been trying to follow lower cholesterol, diabetic diet, with wt overall stable,  but little exercise however. Denies worsening depressive symptoms, suicidal ideation, or panic; BP Readings from Last 3 Encounters:  05/23/14 170/92  11/20/13 116/80  09/19/13 127/74  BP's at home < 140/90. Pt continues to have recurring LBP without change in severity, bowel or bladder change, fever, wt loss,  worsening LE pain/numbness/weakness except for Left sciatica pain, but no significant gait change or falls, has seen Dr Luci Bank [facility] maybe once per yr Past Medical History  Diagnosis Date  . CAD (coronary artery disease)     a. CABG x 6 in 1992. b. s/p DES x 2 to SVG -> OM1/OM2 in 09/2010. c. s/p DES to mid LAD 10/2010. d. NSTEMI (off DAPT) s/p PTCA of instent renarrowing of DES in SVG-OM, Recs for lifelong DAPT   . Hyperlipidemia     intolerance to Crestor Zocor Vytorin. He tolerates Pravachol...low HDL  . GERD (gastroesophageal reflux disease)   . Rosacea   . Prostatitis   . Depression   . Benign prostatic hypertrophy   . Asthma   . Paresthesias     successful surgery by Dr. Earnie Larsson  . Arthritis   . Diabetes mellitus     type II-diet  . Diverticulosis   . Hx of CABG     1992  . Ejection fraction     EF 60%, echo, June, 2012  . BARRETTS ESOPHAGUS 06/02/2007  . Bladder neck obstruction 09/29/2010  . SKIN LESION 06/02/2007  . 1St degree AV block   . Sinus bradycardia    asymptomatic   . Cervical disc disease   . Subdural hematoma     Subdural hematoma secondary to falling off a ladder  April, 2014,  medical therapy,  subdural did not expand while blood levels of ddual antiplatelet therapy decreased over one week.  Episode of chest pain in the hospital,, dual antiplatelet therapy restarted approximately one week after it was held  . Carotid bruit     April, 2014  . Syncope     The patient lost consciousness and hit his head July 09 2012, not clear yet if he had true syncope  . Carotid artery disease     Doppler, May, 2014, 8-12% R. ICA, 75-17% LICA, with recent syncope patient sent for consult with vascular surgery  . Fall at home July 09, 2012  . Concussion July 09, 2012    Scalp   Past Surgical History  Procedure Laterality Date  . C-spine disc surgery  03/2006  . C4-5 surgery  01/2009  . Cardiac catheterization  July 2012    stent placed  X's  4  . Appendectomy    . Tonsillectomy    . Coccyx fracture surgery  1997  . Coronary artery bypass graft  1992  . Barretts esophagus   20/20/09  . Bladder neck obstruction  09/29/10  . Endarterectomy Left 09/07/2012  Procedure: ENDARTERECTOMY CAROTID with patch angioplasty;  Surgeon: Mal Misty, MD;  Location: [facility];  Service: Vascular;  Laterality: Left;  . Left heart catheterization with coronary/graft angiogram N/A 12/21/2011    Procedure: LEFT HEART CATHETERIZATION WITH Beatrix Fetters;  Surgeon: Hillary Bow, MD;  Location: [facility] CATH LAB;  Service: Cardiovascular;  Laterality: N/A;    reports that he has quit smoking. His smoking use included Cigarettes. He started smoking about 59 years ago. He has never used smokeless tobacco. He reports that he drinks alcohol. He reports that he does not use illicit drugs. family history includes Aneurysm in an other family member; Diabetes in his sister; Heart disease in his father; Hyperlipidemia in an other family member; Liver cancer in an other  family member. Allergies  Allergen Reactions  . Adhesive [Tape]     Breakdown skin  . Atenolol Other (See Comments)    depression  . Codeine Other (See Comments)    Crazy feeling  . Ezetimibe-Simvastatin Other (See Comments)    Severe hip pain  . Rosuvastatin Other (See Comments)    Severe hip pain  . Simvastatin Other (See Comments)    Severe hip pain  . Ultram [Tramadol] Nausea And Vomiting  . Zofran [Ondansetron Hcl]     Severe nausea and constipation   Current Outpatient Prescriptions on File Prior to Visit  Medication Sig Dispense Refill  . aspirin (ASPIRIN EC) 81 MG EC tablet Take 81 mg by mouth daily.      . Cholecalciferol (VITAMIN D3) 2000 UNITS TABS Take 1 tablet by mouth daily.     . clopidogrel (PLAVIX) 75 MG tablet TAKE 1 TABLET (75 MG TOTAL) BY MOUTH DAILY. 90 tablet 1  . diphenhydrAMINE (BENADRYL) 25 MG tablet Take 25 mg by mouth daily as needed for allergies.     . DULERA 100-5 MCG/ACT AERO USE 2 PUFFS TWICE A DAY 16 g 11  . Fluticasone-Salmeterol (ADVAIR HFA IN) Inhale into the lungs as directed.    Marland Kitchen glucose blood (ONE TOUCH ULTRA TEST) test strip Use as instructed once daily to check blood sugar.  Diagnosis code 250.02 100 each 2  . metoprolol succinate (TOPROL-XL) 25 MG 24 hr tablet TAKE 1/2 TABLET BY MOUTH TWICE A DAY 90 tablet 3  . montelukast (SINGULAIR) 10 MG tablet TAKE 1 TABLET (10 MG TOTAL) BY MOUTH AT BEDTIME. 90 tablet 2  . niacin (NIASPAN) 1000 MG CR tablet TAKE 1 TABLET (1,000 MG TOTAL) BY MOUTH AT BEDTIME. TAKE WITH ASPIRIN 90 tablet 0  . nitroGLYCERIN (NITROLINGUAL) 0.4 MG/SPRAY spray Place 1 spray under the tongue every 5 (five) minutes as needed for chest pain.     . nitroGLYCERIN (NITROSTAT) 0.4 MG SL tablet Place 0.4 mg under the tongue every 5 (five) minutes as needed for chest pain.     . pantoprazole (PROTONIX) 40 MG tablet TAKE 1 TABLET BY MOUTH ONCE DAILY 90 tablet 1  . pravastatin (PRAVACHOL) 40 MG tablet TAKE 1 TABLET (40 MG TOTAL) BY  MOUTH EVERY EVENING. 90 tablet 0  . tamsulosin (FLOMAX) 0.4 MG CAPS Take 1 capsule (0.4 mg total) by mouth daily after supper. 90 capsule 3   No current facility-administered medications on file prior to visit.    Review of Systems  Constitutional: Negative for unusual diaphoresis or other sweats  HENT: Negative for ringing in ear Eyes: Negative for double vision or worsening visual disturbance.  Respiratory: Negative for choking and stridor.   Gastrointestinal: Negative for vomiting or  other signifcant bowel change Genitourinary: Negative for hematuria or decreased urine volume.  Musculoskeletal: Negative for other MSK pain or swelling Skin: Negative for color change and worsening wound.  Neurological: Negative for tremors and numbness other than noted  Psychiatric/Behavioral: Negative for decreased concentration or agitation other than above       Objective:   Physical Exam BP 170/92 mmHg  Pulse 69  Temp(Src) 97.9 F (36.6 C) (Oral)  Ht 6' (1.829 m)  Wt 222 lb (100.699 kg)  BMI 30.10 kg/m2 VS noted,  Constitutional: Pt appears well-developed, well-nourished.  HENT: Head: NCAT.  Right Ear: External ear normal.  Left Ear: External ear normal.  Eyes: . Pupils are equal, round, and reactive to light. Conjunctivae and EOM are normal Neck: Normal range of motion. Neck supple.  Cardiovascular: Normal rate and regular rhythm.   Pulmonary/Chest: Effort normal and breath sounds without rales or wheezing.  Abd:  Soft, NT, ND, + BS Neurological: Pt is alert. Not confused , motor grossly intact Skin: Skin is warm. No rash Psychiatric: Pt behavior is normal. No agitation. not depressed affect    Assessment & Plan:

## 2014-05-23 NOTE — Patient Instructions (Signed)

## 2014-05-23 NOTE — Assessment & Plan Note (Signed)
stable overall by history and exam, recent data reviewed with pt, and pt to continue medical treatment as before,  to f/u any worsening symptoms or concerns Lab Results  Component Value Date   WBC 10.9* 11/20/2013   HGB 16.8 11/20/2013   HCT 49.8 11/20/2013   PLT 177.0 11/20/2013   GLUCOSE 116* 11/20/2013   CHOL 141 11/20/2013   TRIG 91.0 11/20/2013   HDL 39.70 11/20/2013   LDLDIRECT 87.6 09/19/2008   LDLCALC 83 11/20/2013   ALT 27 11/20/2013   AST 24 11/20/2013   NA 137 11/20/2013   K 4.2 11/20/2013   CL 101 11/20/2013   CREATININE 1.3 11/20/2013   BUN 15 11/20/2013   CO2 27 11/20/2013   TSH 3.69 11/20/2013   PSA 1.47 11/20/2013   INR 1.01 09/05/2012   HGBA1C 6.8* 11/20/2013   MICROALBUR 0.7 11/20/2013

## 2014-05-23 NOTE — Assessment & Plan Note (Signed)
stable overall by history and exam, recent data reviewed with pt, and pt to continue medical treatment as before,  to f/u any worsening symptoms or concerns Lab Results  Component Value Date   LDLCALC 83 11/20/2013

## 2014-05-23 NOTE — Assessment & Plan Note (Signed)
stable overall by history and exam, recent data reviewed with pt, and pt to continue medical treatment as before,  to f/u any worsening symptoms or concerns Lab Results  Component Value Date   HGBA1C 6.8* 11/20/2013   For f/u lab

## 2014-06-04 ENCOUNTER — Encounter: Payer: Self-pay | Admitting: Gastroenterology

## 2014-07-10 ENCOUNTER — Other Ambulatory Visit: Payer: Self-pay | Admitting: *Deleted

## 2014-07-10 MED ORDER — NITROGLYCERIN 0.4 MG SL SUBL: 0.4000 mg | SUBLINGUAL_TABLET | SUBLINGUAL | Status: DC | PRN

## 2014-07-12 ENCOUNTER — Other Ambulatory Visit: Payer: Self-pay

## 2014-07-12 ENCOUNTER — Other Ambulatory Visit: Payer: Self-pay | Admitting: Cardiology

## 2014-07-12 MED ORDER — NITROGLYCERIN 0.4 MG/SPRAY TL SOLN: 1.0000 | Status: DC | PRN

## 2014-07-28 ENCOUNTER — Encounter: Payer: Self-pay | Admitting: Cardiology

## 2014-07-31 ENCOUNTER — Encounter: Payer: Self-pay | Admitting: Gastroenterology

## 2014-08-02 ENCOUNTER — Other Ambulatory Visit: Payer: Self-pay | Admitting: Internal Medicine

## 2014-08-16 ENCOUNTER — Encounter: Payer: Self-pay | Admitting: Cardiology

## 2014-08-16 ENCOUNTER — Ambulatory Visit (INDEPENDENT_AMBULATORY_CARE_PROVIDER_SITE_OTHER): Payer: Medicare Other | Admitting: Cardiology

## 2014-08-16 VITALS — BP 140/82 | HR 63 | Ht 71.5 in | Wt 218.0 lb

## 2014-08-16 DIAGNOSIS — I25810 Atherosclerosis of coronary artery bypass graft(s) without angina pectoris: Secondary | ICD-10-CM | POA: Diagnosis not present

## 2014-08-16 DIAGNOSIS — I779 Disorder of arteries and arterioles, unspecified: Secondary | ICD-10-CM

## 2014-08-16 DIAGNOSIS — E785 Hyperlipidemia, unspecified: Secondary | ICD-10-CM | POA: Diagnosis not present

## 2014-08-16 DIAGNOSIS — I2510 Atherosclerotic heart disease of native coronary artery without angina pectoris: Secondary | ICD-10-CM

## 2014-08-16 DIAGNOSIS — I739 Peripheral vascular disease, unspecified: Secondary | ICD-10-CM

## 2014-08-16 NOTE — Patient Instructions (Signed)
Medication Instructions:  Same  Labwork: None  Testing/Procedures: None  Follow-Up: Your physician wants you to follow-up in: 6 months. You will receive a reminder letter in the mail two months in advance. If you don't receive a letter, please call our office to schedule the follow-up appointment.

## 2014-08-16 NOTE — Assessment & Plan Note (Signed)
The patient has known coronary disease. He is post-CABG and recurrent severe disease over time with multiple interventions. He is been very dependent on Plavix along with his aspirin. He had a subdural hematoma after falling off a ladder in April, 2014. Dual antiplatelet therapy was held for one week. He had one episode of chest pain in the hospital. After careful consideration we all decided to restart his dual antiplatelet therapy understanding the risks involved in a patient who had had a subdural hematoma. He has done well with this. He is stable at this time. No further workup at this time.

## 2014-08-16 NOTE — Progress Notes (Signed)
Cardiology Office Note   Date:  08/16/2014   ID:  Sean Lozano, DOB [birth year], MRN [id number]  PCP:  Cathlean Cower, MD  Cardiologist:  Dola Argyle, MD   Chief Complaint  Patient presents with  . Appointment    Follow-up coronary artery disease      History of Present Illness: Sean Lozano is a 81 y.o. male who presents  for follow-up of coronary artery disease. Fortunately he is been stable. He is not having any significant chest pain.  The patient is aware that I will be retiring at the end of September, 2016. He knows Dr. Burt Knack well and would like to be followed by Dr. Burt Knack in the future. We will arrange this.  Past Medical History  Diagnosis Date  . CAD (coronary artery disease)     a. CABG x 6 in 1992. b. s/p DES x 2 to SVG -> OM1/OM2 in 09/2010. c. s/p DES to mid LAD 10/2010. d. NSTEMI (off DAPT) s/p PTCA of instent renarrowing of DES in SVG-OM, Recs for lifelong DAPT   . Hyperlipidemia     intolerance to Crestor Zocor Vytorin. He tolerates Pravachol...low HDL  . GERD (gastroesophageal reflux disease)   . Rosacea   . Prostatitis   . Depression   . Benign prostatic hypertrophy   . Asthma   . Paresthesias     successful surgery by Dr. Earnie Larsson  . Arthritis   . Diabetes mellitus     type II-diet  . Diverticulosis   . Hx of CABG     1992  . Ejection fraction     EF 60%, echo, June, 2012  . BARRETTS ESOPHAGUS 06/02/2007  . Bladder neck obstruction 09/29/2010  . SKIN LESION 06/02/2007  . 1St degree AV block   . Sinus bradycardia     asymptomatic   . Cervical disc disease   . Subdural hematoma     Subdural hematoma secondary to falling off a ladder  April, 2014,  medical therapy,  subdural did not expand while blood levels of ddual antiplatelet therapy decreased over one week.  Episode of chest pain in the hospital,, dual antiplatelet therapy restarted approximately one week after it was held  . Carotid bruit     April, 2014  . Syncope     The patient lost  consciousness and hit his head July 09 2012, not clear yet if he had true syncope  . Carotid artery disease     Doppler, May, 2014, 2-37% R. ICA, 62-83% LICA, with recent syncope patient sent for consult with vascular surgery  . Fall at home July 09, 2012  . Concussion July 09, 2012    Scalp    Past Surgical History  Procedure Laterality Date  . C-spine disc surgery  03/2006  . C4-5 surgery  01/2009  . Cardiac catheterization  July 2012    stent placed  X's  4  . Appendectomy    . Tonsillectomy    . Coccyx fracture surgery  1997  . Coronary artery bypass graft  1992  . Barretts esophagus   20/20/09  . Bladder neck obstruction  09/29/10  . Endarterectomy Left 09/07/2012    Procedure: ENDARTERECTOMY CAROTID with patch angioplasty;  Surgeon: Mal Misty, MD;  Location: [facility];  Service: Vascular;  Laterality: Left;  . Left heart catheterization with coronary/graft angiogram N/A 12/21/2011    Procedure: LEFT HEART CATHETERIZATION WITH Beatrix Fetters;  Surgeon: Hillary Bow, MD;  Location: [facility] CATH  LAB;  Service: Cardiovascular;  Laterality: N/A;    Patient Active Problem List   Diagnosis Date Noted  . Knee pain 09/19/2013  . Occlusion and stenosis of carotid artery without mention of cerebral infarction 08/22/2012  . Carotid artery disease   . Syncope   . Chronic diastolic congestive heart failure, NYHA class 1/ECHO 2010 07/10/2012  . SDH (subdural hematoma) 07/09/2012  . Erectile dysfunction 04/01/2011  . Ejection fraction   . CAD (coronary artery disease)   . Hyperlipidemia   . Diabetes   . Hx of CABG   . Preventative health care 09/29/2010  . Dyspnea 09/29/2010  . Bladder neck obstruction 09/29/2010  . Depression 06/04/2007  . ASTHMA 06/04/2007  . GERD 06/04/2007  . BENIGN PROSTATIC HYPERTROPHY 06/04/2007  . BARRETTS ESOPHAGUS 06/02/2007  . Rosacea 06/02/2007  . SKIN LESION 06/02/2007  . FATIGUE 06/02/2007      Current Outpatient Prescriptions    Medication Sig Dispense Refill  . aspirin (ASPIRIN EC) 81 MG EC tablet Take 81 mg by mouth daily.      . Cholecalciferol (VITAMIN D3) 2000 UNITS TABS Take 1 tablet by mouth daily.     . clopidogrel (PLAVIX) 75 MG tablet TAKE 1 TABLET (75 MG TOTAL) BY MOUTH DAILY. 90 tablet 1  . diphenhydrAMINE (BENADRYL) 25 MG tablet Take 25 mg by mouth daily as needed for allergies.     . Fluticasone-Salmeterol (ADVAIR HFA IN) Inhale into the lungs as directed.    Marland Kitchen glucose blood (ONE TOUCH ULTRA TEST) test strip Use as instructed once daily to check blood sugar.  Diagnosis code 250.02 100 each 2  . metoprolol succinate (TOPROL-XL) 25 MG 24 hr tablet TAKE 1/2 TABLET BY MOUTH TWICE A DAY 90 tablet 3  . mometasone-formoterol (DULERA) 100-5 MCG/ACT AERO Inhale 2 puffs into the lungs 2 (two) times daily.    . montelukast (SINGULAIR) 10 MG tablet TAKE 1 TABLET (10 MG TOTAL) BY MOUTH AT BEDTIME. 90 tablet 1  . niacin (NIASPAN) 1000 MG CR tablet TAKE 1 TABLET (1,000 MG TOTAL) BY MOUTH AT BEDTIME. TAKE WITH ASPIRIN 90 tablet 0  . nitroGLYCERIN (NITROLINGUAL) 0.4 MG/SPRAY spray Place 1 spray under the tongue every 5 (five) minutes as needed for chest pain. 12 g 0  . nitroGLYCERIN (NITROSTAT) 0.4 MG SL tablet Place 1 tablet (0.4 mg total) under the tongue every 5 (five) minutes as needed for chest pain. 25 tablet 0  . pantoprazole (PROTONIX) 40 MG tablet TAKE 1 TABLET BY MOUTH ONCE DAILY 90 tablet 1  . pravastatin (PRAVACHOL) 40 MG tablet TAKE 1 TABLET (40 MG TOTAL) BY MOUTH EVERY EVENING. 90 tablet 0  . tamsulosin (FLOMAX) 0.4 MG CAPS Take 1 capsule (0.4 mg total) by mouth daily after supper. 90 capsule 3   No current facility-administered medications for this visit.    Allergies:   Atenolol; Rosuvastatin; Simvastatin; Ultram; Zofran; Adhesive; Codeine; and Ezetimibe-simvastatin    Social History:  The patient  reports that he has quit smoking. His smoking use included Cigarettes. He started smoking about 59 years  ago. He has never used smokeless tobacco. He reports that he drinks alcohol. He reports that he does not use illicit drugs.   Family History:  The patient's family history includes Aneurysm in an other family member; Diabetes in his sister; Heart disease in his father; Hyperlipidemia in an other family member; Liver cancer in an other family member.    ROS:  Please see the history of present illness.  Patient denies fever, chills, headache, sweats, rash, change in vision, change in hearing, chest pain, cough, nausea or vomiting, urinary symptoms. All other systems are reviewed and are negative.    PHYSICAL EXAM: The patient is oriented to person time and place. Affect is normal. Head is atraumatic. Sclera and conjunctiva are normal. He is overweight. There is no jugulovenous distention. Lungs are clear. Respiratory effort is not labored. Cardiac exam reveals S1 and S2. The abdomen is protuberant but soft. There is no peripheral edema. There are no musculoskeletal deformities. There are no skin rashes.  BP 140/82 mmHg  Pulse 63  Ht 5' 11.5" (1.816 m)  Wt 218 lb (98.884 kg)  BMI 29.98 kg/m2 ,   EKG:   EKG is done today and reviewed by me. There is no significant abnormality.   Recent Labs: 11/20/2013: Hemoglobin 16.8; Platelets 177.0; TSH 3.69 05/23/2014: ALT 18; BUN 20; Creatinine 1.15; Potassium 4.2; Sodium 138    Lipid Panel    Component Value Date/Time   CHOL 123 05/23/2014 1120   TRIG 164.0* 05/23/2014 1120   HDL 33.00* 05/23/2014 1120   CHOLHDL 4 05/23/2014 1120   VLDL 32.8 05/23/2014 1120   LDLCALC 57 05/23/2014 1120   LDLDIRECT 87.6 09/19/2008 1546      Wt Readings from Last 3 Encounters:  08/16/14 218 lb (98.884 kg)  05/23/14 222 lb (100.699 kg)  11/20/13 216 lb 12 oz (98.317 kg)      Current medicines are reviewed  The patient understands his medications.     ASSESSMENT AND PLAN:

## 2014-08-16 NOTE — Assessment & Plan Note (Signed)
His carotids are followed carefully by Dr. Kellie Simmering. He has had surgery.

## 2014-08-16 NOTE — Assessment & Plan Note (Signed)
The patient is only able to tolerate Pravachol. This is being used.  The patient's overall status is stable. He will follow with Dr. Burt Knack after I am retired.

## 2014-10-11 ENCOUNTER — Encounter: Payer: Self-pay | Admitting: Family

## 2014-10-13 ENCOUNTER — Other Ambulatory Visit: Payer: Self-pay | Admitting: Cardiology

## 2014-10-15 ENCOUNTER — Ambulatory Visit: Payer: Medicare Other | Admitting: Family

## 2014-10-15 ENCOUNTER — Other Ambulatory Visit (HOSPITAL_COMMUNITY): Payer: Medicare Other

## 2014-10-17 ENCOUNTER — Ambulatory Visit (HOSPITAL_COMMUNITY)
Admission: RE | Admit: 2014-10-17 | Discharge: 2014-10-17 | Disposition: A | Discharge status: Home / Self Care | Payer: Medicare Other | Source: Ambulatory Visit | Attending: Family | Admitting: Family

## 2014-10-17 ENCOUNTER — Encounter: Payer: Self-pay | Admitting: Family

## 2014-10-17 ENCOUNTER — Other Ambulatory Visit: Payer: Self-pay | Admitting: Vascular Surgery

## 2014-10-17 ENCOUNTER — Ambulatory Visit (INDEPENDENT_AMBULATORY_CARE_PROVIDER_SITE_OTHER): Payer: Medicare Other | Admitting: Family

## 2014-10-17 VITALS — BP 103/71 | HR 76 | Resp 16 | Ht 70.75 in | Wt 219.0 lb

## 2014-10-17 DIAGNOSIS — I6523 Occlusion and stenosis of bilateral carotid arteries: Secondary | ICD-10-CM | POA: Diagnosis present

## 2014-10-17 DIAGNOSIS — Z9889 Other specified postprocedural states: Secondary | ICD-10-CM | POA: Diagnosis not present

## 2014-10-17 DIAGNOSIS — Z48812 Encounter for surgical aftercare following surgery on the circulatory system: Secondary | ICD-10-CM | POA: Diagnosis not present

## 2014-10-17 DIAGNOSIS — I6521 Occlusion and stenosis of right carotid artery: Secondary | ICD-10-CM | POA: Diagnosis not present

## 2014-10-17 NOTE — Addendum Note (Signed)
Addended by: Dorthula Rue L on: 10/17/2014 02:48 PM   Modules accepted: Orders

## 2014-10-17 NOTE — Progress Notes (Signed)
Established Carotid Patient   History of Present Illness  Sean Lozano is a 81 y.o. male patient of Dr. Kellie Simmering who is status post left carotid endarterectomy on 09/07/2012. He returns today for routine follow up.  The patient denies any history of TIA or stroke symptoms, specifically the patient denies a history of amaurosis fugax or monocular blindness, denies a history unilateral  of facial drooping, denies a history of hemiplegia, and denies a history of receptive or expressive aphasia.    Pt denies any tingling, numbness, pain, or cold feeling in either hand/arm.  He has lumbar disc issues with left LE radiculopathy.  The patient denies New Medical or Surgical History.  Pt Diabetic: diet controlled, states A1C under 7 for the last several years, was 6.21 May 2014 (review of records) Pt smoker: non-smoker  Pt meds include: Statin : yes ASA: yes Other anticoagulants/antiplatelets: Plavix   Past Medical History  Diagnosis Date  . CAD (coronary artery disease)     a. CABG x 6 in 1992. b. s/p DES x 2 to SVG -> OM1/OM2 in 09/2010. c. s/p DES to mid LAD 10/2010. d. NSTEMI (off DAPT) s/p PTCA of instent renarrowing of DES in SVG-OM, Recs for lifelong DAPT   . Hyperlipidemia     intolerance to Crestor Zocor Vytorin. He tolerates Pravachol...low HDL  . GERD (gastroesophageal reflux disease)   . Rosacea   . Prostatitis   . Depression   . Benign prostatic hypertrophy   . Asthma   . Paresthesias     successful surgery by Dr. Earnie Larsson  . Arthritis   . Diabetes mellitus     type II-diet  . Diverticulosis   . Hx of CABG     1992  . Ejection fraction     EF 60%, echo, June, 2012  . BARRETTS ESOPHAGUS 06/02/2007  . Bladder neck obstruction 09/29/2010  . SKIN LESION 06/02/2007  . 1St degree AV block   . Sinus bradycardia     asymptomatic   . Cervical disc disease   . Subdural hematoma     Subdural hematoma secondary to falling off a ladder  April, 2014,  medical therapy,   subdural did not expand while blood levels of ddual antiplatelet therapy decreased over one week.  Episode of chest pain in the hospital,, dual antiplatelet therapy restarted approximately one week after it was held  . Carotid bruit     April, 2014  . Syncope     The patient lost consciousness and hit his head July 09 2012, not clear yet if he had true syncope  . Carotid artery disease     Doppler, May, 2014, 0-09% R. ICA, 38-18% LICA, with recent syncope patient sent for consult with vascular surgery  . Fall at home July 09, 2012  . Concussion July 09, 2012    Scalp    Social History History  Substance Use Topics  . Smoking status: Former Smoker    Types: Cigarettes    Start date: 01/03/1955  . Smokeless tobacco: Never Used  . Alcohol Use: Yes     Comment: occ    Family History Family History  Problem Relation Age of Onset  . Heart disease Father     mother  . Aneurysm    . Hyperlipidemia    . Liver cancer      grandfather  . Diabetes Sister     mother    Surgical History Past Surgical History  Procedure Laterality Date  .  C-spine disc surgery  03/2006  . C4-5 surgery  01/2009  . Cardiac catheterization  July 2012    stent placed  X's  4  . Appendectomy    . Tonsillectomy    . Coccyx fracture surgery  1997  . Coronary artery bypass graft  1992  . Barretts esophagus   20/20/09  . Bladder neck obstruction  09/29/10  . Endarterectomy Left 09/07/2012    Procedure: ENDARTERECTOMY CAROTID with patch angioplasty;  Surgeon: Mal Misty, MD;  Location: [facility];  Service: Vascular;  Laterality: Left;  . Left heart catheterization with coronary/graft angiogram N/A 12/21/2011    Procedure: LEFT HEART CATHETERIZATION WITH Beatrix Fetters;  Surgeon: Hillary Bow, MD;  Location: [facility] CATH LAB;  Service: Cardiovascular;  Laterality: N/A;    Allergies  Allergen Reactions  . Atenolol Other (See Comments)    depression  . Rosuvastatin Other (See Comments)     Severe hip pain  . Simvastatin Other (See Comments)    Severe hip pain  . Ultram [Tramadol] Nausea And Vomiting  . Zofran [Ondansetron Hcl] Nausea Only and Other (See Comments)    Severe nausea and constipation  . Adhesive [Tape] Other (See Comments)    Breakdown skin  . Codeine Other (See Comments)    Crazy feeling  . Ezetimibe-Simvastatin Other (See Comments)    Severe hip pain    Current Outpatient Prescriptions  Medication Sig Dispense Refill  . aspirin (ASPIRIN EC) 81 MG EC tablet Take 81 mg by mouth daily.      . Cholecalciferol (VITAMIN D3) 2000 UNITS TABS Take 1 tablet by mouth daily.     . clopidogrel (PLAVIX) 75 MG tablet TAKE 1 TABLET (75 MG TOTAL) BY MOUTH DAILY. 90 tablet 1  . diphenhydrAMINE (BENADRYL) 25 MG tablet Take 25 mg by mouth daily as needed for allergies.     . Fluticasone-Salmeterol (ADVAIR HFA IN) Inhale into the lungs as directed.    Marland Kitchen glucose blood (ONE TOUCH ULTRA TEST) test strip Use as instructed once daily to check blood sugar.  Diagnosis code 250.02 100 each 2  . metoprolol succinate (TOPROL-XL) 25 MG 24 hr tablet TAKE 1/2 TABLET BY MOUTH TWICE A DAY 90 tablet 3  . mometasone-formoterol (DULERA) 100-5 MCG/ACT AERO Inhale 2 puffs into the lungs 2 (two) times daily.    . montelukast (SINGULAIR) 10 MG tablet TAKE 1 TABLET (10 MG TOTAL) BY MOUTH AT BEDTIME. 90 tablet 1  . niacin (NIASPAN) 1000 MG CR tablet TAKE 1 TABLET BY MOUTH AT BEDTIME. TAKE WITH ASPIRIN 90 tablet 1  . nitroGLYCERIN (NITROLINGUAL) 0.4 MG/SPRAY spray Place 1 spray under the tongue every 5 (five) minutes as needed for chest pain. 12 g 0  . nitroGLYCERIN (NITROSTAT) 0.4 MG SL tablet Place 1 tablet (0.4 mg total) under the tongue every 5 (five) minutes as needed for chest pain. 25 tablet 0  . pantoprazole (PROTONIX) 40 MG tablet TAKE 1 TABLET BY MOUTH ONCE DAILY 90 tablet 1  . pravastatin (PRAVACHOL) 40 MG tablet TAKE 1 TABLET BY MOUTH EVERY EVENING. 90 tablet 1  . tamsulosin (FLOMAX) 0.4 MG  CAPS Take 1 capsule (0.4 mg total) by mouth daily after supper. 90 capsule 3   No current facility-administered medications for this visit.    Review of Systems : See HPI for pertinent positives and negatives.  Physical Examination  Filed Vitals:   10/17/14 1100 10/17/14 1104  BP: 141/86 103/71  Pulse: 76 76  Resp:  16  Height:  5' 10.75" (1.797 m)  Weight:  219 lb (99.338 kg)  SpO2:  94%   Body mass index is 30.76 kg/(m^2).  General: WDWN obese male in NAD GAIT: normal Eyes: PERRLA Pulmonary:  Non-labored, CTAB, Negative  Rales, Negative rhonchi, & Negative wheezing.  Cardiac: regular rhythm, no detected murmur.  VASCULAR EXAM Carotid Bruits Right Left   Negative Negative    Aorta is not palpable. Radial pulses are 2+ palpable and equal.                                                                                                                            LE Pulses Right Left       POPLITEAL  not palpable   not palpable       POSTERIOR TIBIAL  1+ palpable   1+ palpable        DORSALIS PEDIS      ANTERIOR TIBIAL 1+ palpable  1+ palpable     Gastrointestinal: soft, nontender, BS WNL, no r/g, no palpable masses.  Musculoskeletal: Negative muscle atrophy/wasting. M/S 5/5 throughout, extremities without ischemic changes.  Neurologic: A&O X 3; Appropriate Affect, Speech is normal CN 2-12 intact, pain and light touch intact in extremities, Motor exam as listed above.   Non-Invasive Vascular Imaging CAROTID DUPLEX 10/17/2014  CEREBROVASCULAR DUPLEX EVALUATION    INDICATION: Follow-up carotid disease     PREVIOUS INTERVENTION(S): Left carotid endarterectomy 09/07/2012    DUPLEX EXAM:     RIGHT  LEFT  Peak Systolic Velocities (cm/s) End Diastolic Velocities (cm/s) Plaque LOCATION Peak Systolic Velocities (cm/s) End Diastolic Velocities (cm/s) Plaque  112 23  CCA PROXIMAL 128 17   105 25  CCA MID 108 22   88 21  CCA DISTAL 88 15   119 13  ECA 102 14   105  26 HT ICA PROXIMAL 59 21   136 36  ICA MID 65 20   64 23  ICA DISTAL 61 19     1.2 ICA / CCA Ratio (PSV) NA  Antegrade  Vertebral Flow Antegrade    Brachial Systolic Pressure (mmHg)   Within normal limits  Brachial Artery Waveforms Within normal limits     Plaque Morphology:  HM = Homogeneous, HT = Heterogeneous, CP = Calcific Plaque, SP = Smooth Plaque, IP = Irregular Plaque  ADDITIONAL FINDINGS:     IMPRESSION: 1. Evidence of minimal (<40%) stenosis of the right internal carotid artery. 2. Widely patent left carotid endarterectomy without evidence of restenosis or hyperplasia.  3. Bilateral vertebral artery is within normal limits.    Compared to the previous exam:  No significant change compared to prior exam.       Assessment: AVA DEGUIRE is a 81 y.o. male who is status post left carotid endarterectomy on 09/07/2012. He has no history of stroke or TIA. Today's carotid Duplex suggests minimal (<40%) stenosis of the right internal carotid artery.Widely patent left carotid endarterectomy without evidence  of restenosis or hyperplasia. No significant change compared to prior exam on 09/25/13.     Plan: Follow-up in 1 year with Carotid Duplex.   I discussed in depth with the patient the nature of atherosclerosis, and emphasized the importance of maximal medical management including strict control of blood pressure, blood glucose, and lipid levels, obtaining regular exercise, and continued cessation of smoking.  The patient is aware that without maximal medical management the underlying atherosclerotic disease process will progress, limiting the benefit of any interventions. The patient was given information about stroke prevention and what symptoms should prompt the patient to seek immediate medical care. Thank you for allowing [place] to participate in this patient's care.  Clemon Chambers, RN, MSN, FNP-C [facility] Office: [facility]  Physician: Oneida Alar  10/17/2014 11:08 AM

## 2014-10-17 NOTE — Patient Instructions (Signed)
Stroke Prevention Some medical conditions and behaviors are associated with an increased chance of having a stroke. You may prevent a stroke by making healthy choices and managing medical conditions. HOW CAN I REDUCE MY RISK OF HAVING A STROKE?   Stay physically active. Get at least 30 minutes of activity on most or all days.  Do not smoke. It may also be helpful to avoid exposure to secondhand smoke.  Limit alcohol use. Moderate alcohol use is considered to be:  No more than 2 drinks per day for men.  No more than 1 drink per day for nonpregnant women.  Eat healthy foods. This involves:  Eating 5 or more servings of fruits and vegetables a day.  Making dietary changes that address high blood pressure (hypertension), high cholesterol, diabetes, or obesity.  Manage your cholesterol levels.  Making food choices that are high in fiber and low in saturated fat, trans fat, and cholesterol may control cholesterol levels.  Take any prescribed medicines to control cholesterol as directed by your health care provider.  Manage your diabetes.  Controlling your carbohydrate and sugar intake is recommended to manage diabetes.  Take any prescribed medicines to control diabetes as directed by your health care provider.  Control your hypertension.  Making food choices that are low in salt (sodium), saturated fat, trans fat, and cholesterol is recommended to manage hypertension.  Take any prescribed medicines to control hypertension as directed by your health care provider.  Maintain a healthy weight.  Reducing calorie intake and making food choices that are low in sodium, saturated fat, trans fat, and cholesterol are recommended to manage weight.  Stop drug abuse.  Avoid taking birth control pills.  Talk to your health care provider about the risks of taking birth control pills if you are over 35 years old, smoke, get migraines, or have ever had a blood clot.  Get evaluated for sleep  disorders (sleep apnea).  Talk to your health care provider about getting a sleep evaluation if you snore a lot or have excessive sleepiness.  Take medicines only as directed by your health care provider.  For some people, aspirin or blood thinners (anticoagulants) are helpful in reducing the risk of forming abnormal blood clots that can lead to stroke. If you have the irregular heart rhythm of atrial fibrillation, you should be on a blood thinner unless there is a good reason you cannot take them.  Understand all your medicine instructions.  Make sure that other conditions (such as anemia or atherosclerosis) are addressed. SEEK IMMEDIATE MEDICAL CARE IF:   You have sudden weakness or numbness of the face, arm, or leg, especially on one side of the body.  Your face or eyelid droops to one side.  You have sudden confusion.  You have trouble speaking (aphasia) or understanding.  You have sudden trouble seeing in one or both eyes.  You have sudden trouble walking.  You have dizziness.  You have a loss of balance or coordination.  You have a sudden, severe headache with no known cause.  You have new chest pain or an irregular heartbeat. Any of these symptoms may represent a serious problem that is an emergency. Do not wait to see if the symptoms will go away. Get medical help at once. Call your local emergency services (911 in [place] Do not drive yourself to the hospital. Document Released: 05/06/2004 Document Revised: 08/13/2013 Document Reviewed: 09/29/2012 Exit[employer] Patient Information 2015 [employer]. This information is not intended to replace advice given   to you by your health care provider. Make sure you discuss any questions you have with your health care provider.  

## 2014-10-19 ENCOUNTER — Other Ambulatory Visit: Payer: Self-pay | Admitting: Cardiology

## 2014-11-19 ENCOUNTER — Other Ambulatory Visit (INDEPENDENT_AMBULATORY_CARE_PROVIDER_SITE_OTHER): Payer: Medicare Other

## 2014-11-19 DIAGNOSIS — Z0189 Encounter for other specified special examinations: Secondary | ICD-10-CM | POA: Diagnosis not present

## 2014-11-19 DIAGNOSIS — E119 Type 2 diabetes mellitus without complications: Secondary | ICD-10-CM | POA: Diagnosis not present

## 2014-11-19 DIAGNOSIS — I10 Essential (primary) hypertension: Secondary | ICD-10-CM

## 2014-11-19 DIAGNOSIS — E785 Hyperlipidemia, unspecified: Secondary | ICD-10-CM

## 2014-11-19 DIAGNOSIS — Z0000 Encounter for general adult medical examination without abnormal findings: Secondary | ICD-10-CM

## 2014-11-19 LAB — HEPATIC FUNCTION PANEL
ALT: 19 U/L (ref 0–53)
AST: 21 U/L (ref 0–37)
Albumin: 4 g/dL (ref 3.5–5.2)
Alkaline Phosphatase: 59 U/L (ref 39–117)
Bilirubin, Direct: 0.1 mg/dL (ref 0.0–0.3)
Total Bilirubin: 0.7 mg/dL (ref 0.2–1.2)
Total Protein: 7.2 g/dL (ref 6.0–8.3)

## 2014-11-19 LAB — BASIC METABOLIC PANEL
BUN: 16 mg/dL (ref 6–23)
CO2: 26 mEq/L (ref 19–32)
Calcium: 9.3 mg/dL (ref 8.4–10.5)
Chloride: 104 mEq/L (ref 96–112)
Creatinine, Ser: 1.19 mg/dL (ref 0.40–1.50)
GFR: 62.59 mL/min (ref >60.00)
Glucose, Bld: 130 mg/dL — ABNORMAL HIGH (ref 70–99)
Potassium: 3.9 mEq/L (ref 3.5–5.1)
Sodium: 139 mEq/L (ref 135–145)

## 2014-11-19 LAB — URINALYSIS, ROUTINE W REFLEX MICROSCOPIC
Bilirubin Urine: NEGATIVE
Ketones, ur: NEGATIVE
Leukocytes, UA: NEGATIVE
Nitrite: NEGATIVE
RBC / HPF: NONE SEEN (ref 0–?)
Specific Gravity, Urine: 1.02 (ref 1.000–1.030)
Total Protein, Urine: NEGATIVE
Urine Glucose: NEGATIVE
Urobilinogen, UA: 1 (ref 0.0–1.0)
pH: 6.5 (ref 5.0–8.0)

## 2014-11-19 LAB — LIPID PANEL
Cholesterol: 121 mg/dL (ref 0–200)
HDL: 33.5 mg/dL — ABNORMAL LOW (ref >39.00)
LDL Cholesterol: 72 mg/dL (ref 0–99)
NonHDL: 87.66
Total CHOL/HDL Ratio: 4
Triglycerides: 80 mg/dL (ref 0.0–149.0)
VLDL: 16 mg/dL (ref 0.0–40.0)

## 2014-11-19 LAB — CBC WITH DIFFERENTIAL/PLATELET
Basophils Absolute: 0 10*3/uL (ref 0.0–0.1)
Basophils Relative: 0.3 % (ref 0.0–3.0)
Eosinophils Absolute: 0.4 10*3/uL (ref 0.0–0.7)
Eosinophils Relative: 3.5 % (ref 0.0–5.0)
HCT: 48.8 % (ref 39.0–52.0)
Hemoglobin: 16.3 g/dL (ref 13.0–17.0)
Lymphocytes Relative: 43.1 % (ref 12.0–46.0)
Lymphs Abs: 5 10*3/uL — ABNORMAL HIGH (ref 0.7–4.0)
MCHC: 33.5 g/dL (ref 30.0–36.0)
MCV: 99.6 fl (ref 78.0–100.0)
Monocytes Absolute: 0.9 10*3/uL (ref 0.1–1.0)
Monocytes Relative: 7.9 % (ref 3.0–12.0)
Neutro Abs: 5.3 10*3/uL (ref 1.4–7.7)
Neutrophils Relative %: 45.2 % (ref 43.0–77.0)
Platelets: 191 10*3/uL (ref 150.0–400.0)
RBC: 4.9 Mil/uL (ref 4.22–5.81)
RDW: 14 % (ref 11.5–15.5)
WBC: 11.7 10*3/uL — ABNORMAL HIGH (ref 4.0–10.5)

## 2014-11-19 LAB — TSH: TSH: 3.95 u[IU]/mL (ref 0.35–4.50)

## 2014-11-19 LAB — HEMOGLOBIN A1C: Hgb A1c MFr Bld: 6.6 % — ABNORMAL HIGH (ref 4.6–6.5)

## 2014-11-21 ENCOUNTER — Encounter: Payer: Self-pay | Admitting: Internal Medicine

## 2014-11-21 ENCOUNTER — Telehealth: Payer: Self-pay | Admitting: *Deleted

## 2014-11-21 ENCOUNTER — Ambulatory Visit (INDEPENDENT_AMBULATORY_CARE_PROVIDER_SITE_OTHER): Payer: Medicare Other | Admitting: Internal Medicine

## 2014-11-21 VITALS — BP 132/72 | HR 78 | Temp 97.7°F | Ht 71.0 in | Wt 220.0 lb

## 2014-11-21 DIAGNOSIS — E119 Type 2 diabetes mellitus without complications: Secondary | ICD-10-CM

## 2014-11-21 DIAGNOSIS — Z0000 Encounter for general adult medical examination without abnormal findings: Secondary | ICD-10-CM | POA: Diagnosis not present

## 2014-11-21 MED ORDER — MOMETASONE FURO-FORMOTEROL FUM 100-5 MCG/ACT IN AERO: 2.0000 | INHALATION_SPRAY | Freq: Two times a day (BID) | RESPIRATORY_TRACT | Status: DC

## 2014-11-21 NOTE — Assessment & Plan Note (Signed)
stable overall by history and exam, recent data reviewed with pt, and pt to continue medical treatment as before,  to f/u any worsening symptoms or concerns Lab Results  Component Value Date   HGBA1C 6.6* 11/19/2014

## 2014-11-21 NOTE — Progress Notes (Signed)
Subjective:    Patient ID: Sean Lozano, male    DOB: 07-26-1933, 81 y.o.   MRN: [id number]  HPI  Here for wellness and f/u;  Overall doing ok;  Pt denies Chest pain, worsening SOB, DOE, wheezing, orthopnea, PND, worsening LE edema, palpitations, dizziness or syncope.  Pt denies neurological change such as new headache, facial or extremity weakness.  Pt denies polydipsia, polyuria, or low sugar symptoms. Pt states overall good compliance with treatment and medications, good tolerability, and has been trying to follow appropriate diet.  Pt denies worsening depressive symptoms, suicidal ideation or panic. No fever, night sweats, wt loss, loss of appetite, or other constitutional symptoms.  Pt states good ability with ADL's, has low fall risk, home safety reviewed and adequate, no other significant changes in hearing or vision, and only occasionally active with exercise.   Now s/p 3 wks fall out of bed turning over and hit left maxillary sinus area to nightstand, with bruising and swelling now near resolved. No vision change.  Did not feel need to seek medical attention.  Past Medical History  Diagnosis Date  . CAD (coronary artery disease)     a. CABG x 6 in 1992. b. s/p DES x 2 to SVG -> OM1/OM2 in 09/2010. c. s/p DES to mid LAD 10/2010. d. NSTEMI (off DAPT) s/p PTCA of instent renarrowing of DES in SVG-OM, Recs for lifelong DAPT   . Hyperlipidemia     intolerance to Crestor Zocor Vytorin. He tolerates Pravachol...low HDL  . GERD (gastroesophageal reflux disease)   . Rosacea   . Prostatitis   . Depression   . Benign prostatic hypertrophy   . Asthma   . Paresthesias     successful surgery by Dr. Earnie Larsson  . Arthritis   . Diabetes mellitus     type II-diet  . Diverticulosis   . Hx of CABG     1992  . Ejection fraction     EF 60%, echo, June, 2012  . BARRETTS ESOPHAGUS 06/02/2007  . Bladder neck obstruction 09/29/2010  . SKIN LESION 06/02/2007  . 1St degree AV block   . Sinus bradycardia      asymptomatic   . Cervical disc disease   . Subdural hematoma     Subdural hematoma secondary to falling off a ladder  April, 2014,  medical therapy,  subdural did not expand while blood levels of ddual antiplatelet therapy decreased over one week.  Episode of chest pain in the hospital,, dual antiplatelet therapy restarted approximately one week after it was held  . Carotid bruit     April, 2014  . Syncope     The patient lost consciousness and hit his head July 09 2012, not clear yet if he had true syncope  . Carotid artery disease     Doppler, May, 2014, 7-40% R. ICA, 81-44% LICA, with recent syncope patient sent for consult with vascular surgery  . Fall at home July 09, 2012  . Concussion July 09, 2012    Scalp  . Hearing loss 2014   Past Surgical History  Procedure Laterality Date  . C-spine disc surgery  03/2006  . C4-5 surgery  01/2009  . Cardiac catheterization  July 2012    stent placed  X's  4  . Appendectomy    . Tonsillectomy    . Coccyx fracture surgery  1997  . Coronary artery bypass graft  1992  . Barretts esophagus   20/20/09  . Bladder neck  obstruction  09/29/10  . Endarterectomy Left 09/07/2012    Procedure: ENDARTERECTOMY CAROTID with patch angioplasty;  Surgeon: Mal Misty, MD;  Location: [facility];  Service: Vascular;  Laterality: Left;  . Left heart catheterization with coronary/graft angiogram N/A 12/21/2011    Procedure: LEFT HEART CATHETERIZATION WITH Beatrix Fetters;  Surgeon: Hillary Bow, MD;  Location: [facility] CATH LAB;  Service: Cardiovascular;  Laterality: N/A;    reports that he has quit smoking. His smoking use included Cigarettes. He started smoking about 59 years ago. He has never used smokeless tobacco. He reports that he drinks alcohol. He reports that he does not use illicit drugs. family history includes Aneurysm in an other family member; Diabetes in his mother and sister; Heart attack in his father; Heart disease in his father and  mother; Hyperlipidemia in his son and another family member; Hypertension in his son and son; Kidney disease in his sister; Liver cancer in an other family member. Allergies  Allergen Reactions  . Atenolol Other (See Comments)    depression  . Rosuvastatin Other (See Comments)    Severe hip pain  . Simvastatin Other (See Comments)    Severe hip pain  . Ultram [Tramadol] Nausea And Vomiting  . Zofran [Ondansetron Hcl] Nausea Only and Other (See Comments)    Severe nausea and constipation  . Adhesive [Tape] Other (See Comments)    Breakdown skin  . Codeine Other (See Comments)    Crazy feeling  . Ezetimibe-Simvastatin Other (See Comments)    Severe hip pain   Current Outpatient Prescriptions on File Prior to Visit  Medication Sig Dispense Refill  . aspirin (ASPIRIN EC) 81 MG EC tablet Take 81 mg by mouth daily.      . Cholecalciferol (VITAMIN D3) 2000 UNITS TABS Take 1 tablet by mouth daily.     . clopidogrel (PLAVIX) 75 MG tablet TAKE 1 TABLET (75 MG TOTAL) BY MOUTH DAILY. 90 tablet 1  . diphenhydrAMINE (BENADRYL) 25 MG tablet Take 25 mg by mouth daily as needed for allergies.     . Fluticasone-Salmeterol (ADVAIR HFA IN) Inhale into the lungs as directed.    Marland Kitchen glucose blood (ONE TOUCH ULTRA TEST) test strip Use as instructed once daily to check blood sugar.  Diagnosis code 250.02 100 each 2  . metoprolol succinate (TOPROL-XL) 25 MG 24 hr tablet TAKE 1/2 TABLET BY MOUTH TWICE A DAY 90 tablet 1  . montelukast (SINGULAIR) 10 MG tablet TAKE 1 TABLET (10 MG TOTAL) BY MOUTH AT BEDTIME. 90 tablet 1  . niacin (NIASPAN) 1000 MG CR tablet TAKE 1 TABLET BY MOUTH AT BEDTIME. TAKE WITH ASPIRIN 90 tablet 1  . nitroGLYCERIN (NITROLINGUAL) 0.4 MG/SPRAY spray Place 1 spray under the tongue every 5 (five) minutes as needed for chest pain. 12 g 0  . nitroGLYCERIN (NITROSTAT) 0.4 MG SL tablet Place 1 tablet (0.4 mg total) under the tongue every 5 (five) minutes as needed for chest pain. 25 tablet 0  .  pantoprazole (PROTONIX) 40 MG tablet TAKE 1 TABLET BY MOUTH ONCE DAILY 90 tablet 1  . pravastatin (PRAVACHOL) 40 MG tablet TAKE 1 TABLET BY MOUTH EVERY EVENING. 90 tablet 1  . tamsulosin (FLOMAX) 0.4 MG CAPS Take 1 capsule (0.4 mg total) by mouth daily after supper. 90 capsule 3   No current facility-administered medications on file prior to visit.    Review of Systems Constitutional: Negative for increased diaphoresis, other activity, appetite or siginficant weight change other than noted HENT: Negative  for worsening hearing loss, ear pain, facial swelling, mouth sores and neck stiffness.   Eyes: Negative for other worsening pain, redness or visual disturbance.  Respiratory: Negative for shortness of breath and wheezing  Cardiovascular: Negative for chest pain and palpitations.  Gastrointestinal: Negative for diarrhea, blood in stool, abdominal distention or other pain Genitourinary: Negative for hematuria, flank pain or change in urine volume.  Musculoskeletal: Negative for myalgias or other joint complaints.  Skin: Negative for color change and wound or drainage.  Neurological: Negative for syncope and numbness. other than noted Hematological: Negative for adenopathy. or other swelling Psychiatric/Behavioral: Negative for hallucinations, SI, self-injury, decreased concentration or other worsening agitation.      Objective:   Physical Exam BP 132/72 mmHg  Pulse 78  Temp(Src) 97.7 F (36.5 C) (Oral)  Ht 5\' 11"  (1.803 m)  Wt 220 lb (99.791 kg)  BMI 30.70 kg/m2  SpO2 94% VS noted,  Constitutional: Pt is oriented to person, place, and time. Appears well-developed and well-nourished, in no significant distress Head: Normocephalic and atraumatic.  Right Ear: External ear normal.  Left Ear: External ear normal.  Nose: Nose normal.  Mouth/Throat: Oropharynx is clear and moist.  Eyes: Conjunctivae and EOM are normal. Pupils are equal, round, and reactive to light.  Mod bruising to  left suborbital, no bony abnormal Neck: Normal range of motion. Neck supple. No JVD present. No tracheal deviation present or significant neck LA or mass Cardiovascular: Normal rate, regular rhythm, normal heart sounds and intact distal pulses.   Pulmonary/Chest: Effort normal and breath sounds without rales or wheezing  Abdominal: Soft. Bowel sounds are normal. NT. No HSM  Musculoskeletal: Normal range of motion. Exhibits no edema.  Lymphadenopathy:  Has no cervical adenopathy.  Neurological: Pt is alert and oriented to person, place, and time. Pt has normal reflexes. No cranial nerve deficit. Motor grossly intact Skin: Skin is warm and dry. No rash noted.  Psychiatric:  Has normal mood and affect. Behavior is normal.     Assessment & Plan:

## 2014-11-21 NOTE — Patient Instructions (Signed)
Please continue all other medications as before, and refills have been done if requested.  Please have the pharmacy call with any other refills you may need.  Please continue your efforts at being more active, low cholesterol diet, and weight control.  You are otherwise up to date with prevention measures today.  Please keep your appointments with your specialists as you may have planned  Please return in 6 months, or sooner if needed, with Lab testing done 3-5 days before  

## 2014-11-21 NOTE — Telephone Encounter (Signed)
Left msg on triage went to pick [facility] up pharmacy inform him he need to get a prior authorization on medication. Requesting md to proceed with PA...Johny Chess

## 2014-11-21 NOTE — Progress Notes (Signed)
Pre visit review using our clinic review tool, if applicable. No additional management support is needed unless otherwise documented below in the visit note. 

## 2014-11-21 NOTE — Assessment & Plan Note (Signed)

## 2014-11-22 ENCOUNTER — Telehealth: Payer: Self-pay

## 2014-11-22 NOTE — Telephone Encounter (Signed)
Pa started 

## 2014-11-22 NOTE — Telephone Encounter (Signed)
PA started via covermymeds.  DULERA 100-85mcg/ACT - 2 puffs daily.   This rx is usually 1 capsule (puff) daily.

## 2014-11-26 NOTE — Telephone Encounter (Signed)
Pa approved. Pharmacy notified 

## 2015-01-31 ENCOUNTER — Other Ambulatory Visit: Payer: Self-pay | Admitting: Internal Medicine

## 2015-02-14 ENCOUNTER — Ambulatory Visit (INDEPENDENT_AMBULATORY_CARE_PROVIDER_SITE_OTHER): Payer: Medicare Other | Admitting: Cardiovascular Disease

## 2015-02-14 ENCOUNTER — Encounter: Payer: Self-pay | Admitting: Cardiovascular Disease

## 2015-02-14 VITALS — BP 125/70 | HR 77 | Ht 71.0 in | Wt 218.0 lb

## 2015-02-14 DIAGNOSIS — I25810 Atherosclerosis of coronary artery bypass graft(s) without angina pectoris: Secondary | ICD-10-CM

## 2015-02-14 DIAGNOSIS — E785 Hyperlipidemia, unspecified: Secondary | ICD-10-CM

## 2015-02-14 NOTE — Patient Instructions (Signed)

## 2015-02-16 ENCOUNTER — Encounter: Payer: Self-pay | Admitting: Cardiovascular Disease

## 2015-02-16 NOTE — Progress Notes (Signed)
Cardiology Office Note Date:  02/16/2015   ID:  Sean Lozano, DOB [birth year], MRN [id number]  PCP:  Cathlean Cower, MD  Cardiologist:  Sherren Mocha, MD    Chief Complaint  Patient presents with  . Coronary Artery Disease     History of Present Illness: Sean Lozano is a 81 y.o. male who presents for follow-up of CAD. He has been previously followed by Dr Ron Parker.   He underwent CABG in 1992 and has undergone multiple PCI procedures most recently in 2012 and 2013. He is to be maintained on lifelong DAPT. LV function has been preserved. Other problems include Type II DM and hyperlipidemia. He has had a subdural hematoma after falling off of a ladder in 2014. He underwent carotid endarterectomy in 2014.   The patient is doing well. Previous anginal symptoms include chest and arm pain. No recent angina. No dyspnea, orthopnea, PND, or palpitations. Medications are stable.    Past Medical History  Diagnosis Date  . CAD (coronary artery disease)     a. CABG x 6 in 1992. b. s/p DES x 2 to SVG -> OM1/OM2 in 09/2010. c. s/p DES to mid LAD 10/2010. d. NSTEMI (off DAPT) s/p PTCA of instent renarrowing of DES in SVG-OM, Recs for lifelong DAPT   . Hyperlipidemia     intolerance to Crestor Zocor Vytorin. He tolerates Pravachol...low HDL  . GERD (gastroesophageal reflux disease)   . Rosacea   . Prostatitis   . Depression   . Benign prostatic hypertrophy   . Asthma   . Paresthesias     successful surgery by Dr. Earnie Larsson  . Arthritis   . Diabetes mellitus     type II-diet  . Diverticulosis   . Hx of CABG     1992  . Ejection fraction     EF 60%, echo, June, 2012  . BARRETTS ESOPHAGUS 06/02/2007  . Bladder neck obstruction 09/29/2010  . SKIN LESION 06/02/2007  . 1St degree AV block   . Sinus bradycardia     asymptomatic   . Cervical disc disease   . Subdural hematoma (HCC)     Subdural hematoma secondary to falling off a ladder  April, 2014,  medical therapy,  subdural did not expand  while blood levels of ddual antiplatelet therapy decreased over one week.  Episode of chest pain in the hospital,, dual antiplatelet therapy restarted approximately one week after it was held  . Carotid bruit     April, 2014  . Syncope     The patient lost consciousness and hit his head July 09 2012, not clear yet if he had true syncope  . Carotid artery disease ([place])     Doppler, May, 2014, 5-10% R. ICA, 25-85% LICA, with recent syncope patient sent for consult with vascular surgery  . Fall at home July 09, 2012  . Concussion July 09, 2012    Scalp  . Hearing loss 2014    Past Surgical History  Procedure Laterality Date  . C-spine disc surgery  03/2006  . C4-5 surgery  01/2009  . Cardiac catheterization  July 2012    stent placed  X's  4  . Appendectomy    . Tonsillectomy    . Coccyx fracture surgery  1997  . Coronary artery bypass graft  1992  . Barretts esophagus   20/20/09  . Bladder neck obstruction  09/29/10  . Endarterectomy Left 09/07/2012    Procedure: ENDARTERECTOMY CAROTID with patch angioplasty;  Surgeon:  Mal Misty, MD;  Location: [facility];  Service: Vascular;  Laterality: Left;  . Left heart catheterization with coronary/graft angiogram N/A 12/21/2011    Procedure: LEFT HEART CATHETERIZATION WITH Beatrix Fetters;  Surgeon: Hillary Bow, MD;  Location: [facility] CATH LAB;  Service: Cardiovascular;  Laterality: N/A;    Current Outpatient Prescriptions  Medication Sig Dispense Refill  . aspirin (ASPIRIN EC) 81 MG EC tablet Take 81 mg by mouth daily.      . Cholecalciferol (VITAMIN D3) 2000 UNITS TABS Take 1 tablet by mouth daily.     . clopidogrel (PLAVIX) 75 MG tablet TAKE 1 TABLET (75 MG TOTAL) BY MOUTH DAILY. 90 tablet 1  . diphenhydrAMINE (BENADRYL) 25 MG tablet Take 25 mg by mouth daily as needed for allergies.     . Fluticasone-Salmeterol (ADVAIR HFA IN) Inhale into the lungs as directed.    Marland Kitchen glucose blood (ONE TOUCH ULTRA TEST) test strip Use as  instructed once daily to check blood sugar.  Diagnosis code 250.02 100 each 2  . metoprolol succinate (TOPROL-XL) 25 MG 24 hr tablet TAKE 1/2 TABLET BY MOUTH TWICE A DAY 90 tablet 1  . mometasone-formoterol (DULERA) 100-5 MCG/ACT AERO Inhale 2 puffs into the lungs 2 (two) times daily. 1 Inhaler 5  . montelukast (SINGULAIR) 10 MG tablet TAKE 1 TABLET (10 MG TOTAL) BY MOUTH AT BEDTIME. 90 tablet 3  . niacin (NIASPAN) 1000 MG CR tablet TAKE 1 TABLET BY MOUTH AT BEDTIME. TAKE WITH ASPIRIN 90 tablet 1  . nitroGLYCERIN (NITROLINGUAL) 0.4 MG/SPRAY spray Place 1 spray under the tongue every 5 (five) minutes as needed for chest pain. 12 g 0  . nitroGLYCERIN (NITROSTAT) 0.4 MG SL tablet Place 1 tablet (0.4 mg total) under the tongue every 5 (five) minutes as needed for chest pain. 25 tablet 0  . pantoprazole (PROTONIX) 40 MG tablet TAKE 1 TABLET BY MOUTH ONCE DAILY 90 tablet 1  . pravastatin (PRAVACHOL) 40 MG tablet TAKE 1 TABLET BY MOUTH EVERY EVENING. 90 tablet 1  . tamsulosin (FLOMAX) 0.4 MG CAPS Take 1 capsule (0.4 mg total) by mouth daily after supper. 90 capsule 3   No current facility-administered medications for this visit.    Allergies:   Atenolol; Rosuvastatin; Simvastatin; Ultram; Zofran; Adhesive; Codeine; and Ezetimibe-simvastatin   Social History:  The patient  reports that he has quit smoking. His smoking use included Cigarettes. He started smoking about 60 years ago. He has never used smokeless tobacco. He reports that he drinks alcohol. He reports that he does not use illicit drugs.   Family History:  The patient's  family history includes Aneurysm in an other family member; Diabetes in his mother and sister; Heart attack in his father; Heart disease in his father and mother; Hyperlipidemia in his son and another family member; Hypertension in his son and son; Kidney disease in his sister; Liver cancer in an other family member.   ROS:  Please see the history of present illness.   Otherwise, review of systems is positive for leg swelling, back pain, easy bruising, leg pain.  All other systems are reviewed and negative.   PHYSICAL EXAM: VS:  BP 125/70 mmHg  Pulse 77  Ht 5\' 11"  (1.803 m)  Wt 218 lb (98.884 kg)  BMI 30.42 kg/m2 , BMI Body mass index is 30.42 kg/(m^2). GEN: Well nourished, well developed, in no acute distress HEENT: normal Neck: no JVD, no masses. No carotid bruits Cardiac: RRR without murmur or gallop  Respiratory:  clear to auscultation bilaterally, normal work of breathing GI: soft, nontender, nondistended, + BS MS: no deformity or atrophy Ext: trace pretibial edema, pedal pulses 2+= bilaterally Skin: warm and dry, no rash Neuro:  Strength and sensation are intact Psych: euthymic mood, full affect  EKG:  EKG is ordered today. The ekg ordered today shows NSR 77 bpm, first degree AV block  Recent Labs: 11/19/2014: ALT 19; BUN 16; Creatinine, Ser 1.19; Hemoglobin 16.3; Platelets 191.0; Potassium 3.9; Sodium 139; TSH 3.95   Lipid Panel     Component Value Date/Time   CHOL 121 11/19/2014 0750   TRIG 80.0 11/19/2014 0750   HDL 33.50* 11/19/2014 0750   CHOLHDL 4 11/19/2014 0750   VLDL 16.0 11/19/2014 0750   LDLCALC 72 11/19/2014 0750   LDLDIRECT 87.6 09/19/2008 1546      Wt Readings from Last 3 Encounters:  02/14/15 218 lb (98.884 kg)  11/21/14 220 lb (99.791 kg)  10/17/14 219 lb (99.338 kg)     ASSESSMENT AND PLAN: 1.  CAD, native vessel and bypass graft disease: no anginal symptoms. Continue long-term DAPT. No medication changes today. Cath data reviewed.  2. Carotid stenosis without hx of stroke: duplex scans followed by [facility]. Hx of CEA noted.  3. Essential HTN: BP controlled on metoprolol succinate  4. Hyperlipidemia: lipids followed by Dr Jenny Reichmann. Treated with Niacin and pravastatin.  Current medicines are reviewed with the patient today.  The patient does not have concerns regarding medicines.  Labs/ tests ordered  today include:   Orders Placed This Encounter  Procedures  . EKG 12-Lead    Disposition:   FU 6 months  Signed, Sherren Mocha, MD  02/16/2015 9:47 PM    [facility], [place], [place]  [address] Phone: [phone]; Fax: [phone]

## 2015-04-14 ENCOUNTER — Other Ambulatory Visit: Payer: Self-pay | Admitting: Cardiology

## 2015-04-17 ENCOUNTER — Other Ambulatory Visit: Payer: Self-pay | Admitting: Cardiology

## 2015-04-18 NOTE — Telephone Encounter (Signed)
Per office visit with Dr Burt Knack, lipids are followed by Dr Jenny Reichmann. Ok to refill or should this be sent to their office? Please advise. Thanks, MI

## 2015-04-18 NOTE — Telephone Encounter (Signed)
Okay to refill? 

## 2015-04-27 ENCOUNTER — Other Ambulatory Visit: Payer: Self-pay | Admitting: Cardiology

## 2015-05-29 ENCOUNTER — Ambulatory Visit: Payer: Medicare Other | Admitting: Internal Medicine

## 2015-05-30 ENCOUNTER — Ambulatory Visit (INDEPENDENT_AMBULATORY_CARE_PROVIDER_SITE_OTHER): Payer: Medicare Other | Admitting: Internal Medicine

## 2015-05-30 ENCOUNTER — Encounter: Payer: Self-pay | Admitting: Internal Medicine

## 2015-05-30 ENCOUNTER — Other Ambulatory Visit (INDEPENDENT_AMBULATORY_CARE_PROVIDER_SITE_OTHER): Payer: Medicare Other

## 2015-05-30 VITALS — BP 138/80 | HR 70 | Temp 98.2°F | Resp 20 | Wt 218.0 lb

## 2015-05-30 DIAGNOSIS — Z23 Encounter for immunization: Secondary | ICD-10-CM | POA: Diagnosis not present

## 2015-05-30 DIAGNOSIS — E119 Type 2 diabetes mellitus without complications: Secondary | ICD-10-CM | POA: Diagnosis not present

## 2015-05-30 DIAGNOSIS — E785 Hyperlipidemia, unspecified: Secondary | ICD-10-CM | POA: Diagnosis not present

## 2015-05-30 DIAGNOSIS — I5032 Chronic diastolic (congestive) heart failure: Secondary | ICD-10-CM | POA: Diagnosis not present

## 2015-05-30 DIAGNOSIS — Z0000 Encounter for general adult medical examination without abnormal findings: Secondary | ICD-10-CM | POA: Diagnosis not present

## 2015-05-30 LAB — URINALYSIS, ROUTINE W REFLEX MICROSCOPIC
Bilirubin Urine: NEGATIVE
Hgb urine dipstick: NEGATIVE
Ketones, ur: NEGATIVE
Leukocytes, UA: NEGATIVE
Nitrite: NEGATIVE
RBC / HPF: NONE SEEN (ref 0–?)
Specific Gravity, Urine: 1.015 (ref 1.000–1.030)
Total Protein, Urine: NEGATIVE
Urine Glucose: NEGATIVE
Urobilinogen, UA: 0.2 (ref 0.0–1.0)
WBC, UA: NONE SEEN (ref 0–?)
pH: 6 (ref 5.0–8.0)

## 2015-05-30 LAB — CBC WITH DIFFERENTIAL/PLATELET
Basophils Absolute: 0 10*3/uL (ref 0.0–0.1)
Basophils Relative: 0.4 % (ref 0.0–3.0)
Eosinophils Absolute: 0.3 10*3/uL (ref 0.0–0.7)
Eosinophils Relative: 2.6 % (ref 0.0–5.0)
HCT: 49.3 % (ref 39.0–52.0)
Hemoglobin: 16.8 g/dL (ref 13.0–17.0)
Lymphocytes Relative: 35.1 % (ref 12.0–46.0)
Lymphs Abs: 3.9 10*3/uL (ref 0.7–4.0)
MCHC: 34 g/dL (ref 30.0–36.0)
MCV: 97.7 fl (ref 78.0–100.0)
Monocytes Absolute: 1 10*3/uL (ref 0.1–1.0)
Monocytes Relative: 8.6 % (ref 3.0–12.0)
Neutro Abs: 6 10*3/uL (ref 1.4–7.7)
Neutrophils Relative %: 53.3 % (ref 43.0–77.0)
Platelets: 187 10*3/uL (ref 150.0–400.0)
RBC: 5.05 Mil/uL (ref 4.22–5.81)
RDW: 13.8 % (ref 11.5–15.5)
WBC: 11.2 10*3/uL — ABNORMAL HIGH (ref 4.0–10.5)

## 2015-05-30 LAB — LIPID PANEL
Cholesterol: 125 mg/dL (ref 0–200)
HDL: 40.2 mg/dL (ref >39.00)
LDL Cholesterol: 68 mg/dL (ref 0–99)
NonHDL: 85.28
Total CHOL/HDL Ratio: 3
Triglycerides: 87 mg/dL (ref 0.0–149.0)
VLDL: 17.4 mg/dL (ref 0.0–40.0)

## 2015-05-30 LAB — BASIC METABOLIC PANEL
BUN: 15 mg/dL (ref 6–23)
CO2: 29 mEq/L (ref 19–32)
Calcium: 9.8 mg/dL (ref 8.4–10.5)
Chloride: 102 mEq/L (ref 96–112)
Creatinine, Ser: 1.09 mg/dL (ref 0.40–1.50)
GFR: 69.17 mL/min (ref >60.00)
Glucose, Bld: 99 mg/dL (ref 70–99)
Potassium: 4.1 mEq/L (ref 3.5–5.1)
Sodium: 139 mEq/L (ref 135–145)

## 2015-05-30 LAB — HEPATIC FUNCTION PANEL
ALT: 20 U/L (ref 0–53)
AST: 23 U/L (ref 0–37)
Albumin: 4.5 g/dL (ref 3.5–5.2)
Alkaline Phosphatase: 53 U/L (ref 39–117)
Bilirubin, Direct: 0.2 mg/dL (ref 0.0–0.3)
Total Bilirubin: 1.1 mg/dL (ref 0.2–1.2)
Total Protein: 7.5 g/dL (ref 6.0–8.3)

## 2015-05-30 LAB — MICROALBUMIN / CREATININE URINE RATIO
Creatinine,U: 95.4 mg/dL
Microalb Creat Ratio: 0.7 mg/g (ref 0.0–30.0)
Microalb, Ur: <0.7 mg/dL (ref 0.0–1.9)

## 2015-05-30 LAB — HEMOGLOBIN A1C: Hgb A1c MFr Bld: 6.7 % — ABNORMAL HIGH (ref 4.6–6.5)

## 2015-05-30 LAB — TSH: TSH: 2.54 u[IU]/mL (ref 0.35–4.50)

## 2015-05-30 MED ORDER — NITROGLYCERIN 0.4 MG SL SUBL: 0.4000 mg | SUBLINGUAL_TABLET | SUBLINGUAL | Status: DC | PRN

## 2015-05-30 MED ORDER — LANCETS MISC: Status: DC

## 2015-05-30 MED ORDER — GLUCOSE BLOOD VI STRP: ORAL_STRIP | Status: DC

## 2015-05-30 MED ORDER — MOMETASONE FURO-FORMOTEROL FUM 100-5 MCG/ACT IN AERO: 2.0000 | INHALATION_SPRAY | Freq: Two times a day (BID) | RESPIRATORY_TRACT | Status: DC

## 2015-05-30 MED ORDER — ONETOUCH ULTRA 2 W/DEVICE KIT: PACK | Status: DC

## 2015-05-30 NOTE — Assessment & Plan Note (Signed)
stable overall by history and exam, recent data reviewed with pt, and pt to continue medical treatment as before,  to f/u any worsening symptoms or concerns Lab Results  Component Value Date   WBC 11.7* 11/19/2014   HGB 16.3 11/19/2014   HCT 48.8 11/19/2014   PLT 191.0 11/19/2014   GLUCOSE 130* 11/19/2014   CHOL 121 11/19/2014   TRIG 80.0 11/19/2014   HDL 33.50* 11/19/2014   LDLDIRECT 87.6 09/19/2008   LDLCALC 72 11/19/2014   ALT 19 11/19/2014   AST 21 11/19/2014   NA 139 11/19/2014   K 3.9 11/19/2014   CL 104 11/19/2014   CREATININE 1.19 11/19/2014   BUN 16 11/19/2014   CO2 26 11/19/2014   TSH 3.95 11/19/2014   PSA 1.47 11/20/2013   INR 1.01 09/05/2012   HGBA1C 6.6* 11/19/2014   MICROALBUR <0.7 05/23/2014

## 2015-05-30 NOTE — Progress Notes (Signed)
Pre visit review using our clinic review tool, if applicable. No additional management support is needed unless otherwise documented below in the visit note. 

## 2015-05-30 NOTE — Assessment & Plan Note (Signed)

## 2015-05-30 NOTE — Assessment & Plan Note (Signed)
stable overall by history and exam, recent data reviewed with pt, and pt to continue medical treatment as before,  to f/u any worsening symptoms or concerns, for lower chol diet  Lab Results  Component Value Date   LDLCALC 72 11/19/2014

## 2015-05-30 NOTE — Progress Notes (Signed)
Subjective:    Patient ID: Sean Lozano, male    DOB: 06-15-1933, 82 y.o.   MRN: [id number]  HPI  Here for wellness and f/u;  Overall doing ok;  Pt denies Chest pain, worsening SOB, DOE, wheezing, orthopnea, PND, worsening LE edema, palpitations, dizziness or syncope.  Pt denies neurological change such as new headache, facial or extremity weakness.  Pt denies polydipsia, polyuria, or low sugar symptoms. Pt states overall good compliance with treatment and medications, good tolerability, and has been trying to follow appropriate diet.  Pt denies worsening depressive symptoms, suicidal ideation or panic. No fever, night sweats, wt loss, loss of appetite, or other constitutional symptoms.  Pt states good ability with ADL's, has low fall risk, home safety reviewed and adequate, no other significant changes in hearing or vision except no longer drives at night, and only occasionally active with exercise. Pt continues to have recurring LBP without change in severity, bowel or bladder change, fever, wt loss,  But + worsening LLE pain no numbness/weakness, and no gait change or falls  Using biofreeze for pain. And occas naproxyn otc.. 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    Needs new glucometer and supplies Past Medical History  Diagnosis Date  . CAD (coronary artery disease)     a. CABG x 6 in 1992. b. s/p DES x 2 to SVG -> OM1/OM2 in 09/2010. c. s/p DES to mid LAD 10/2010. d. NSTEMI (off DAPT) s/p PTCA of instent renarrowing of DES in SVG-OM, Recs for lifelong DAPT   . Hyperlipidemia     intolerance to Crestor Zocor Vytorin. He tolerates Pravachol...low HDL  . GERD (gastroesophageal reflux disease)   . Rosacea   . Prostatitis   . Depression   . Benign prostatic hypertrophy   . Asthma   . Paresthesias     successful surgery by Dr. Earnie Larsson  . Arthritis   . Diabetes mellitus     type II-diet  . Diverticulosis   . Hx of CABG    1992  . Ejection fraction     EF 60%, echo, June, 2012  . BARRETTS ESOPHAGUS 06/02/2007  . Bladder neck obstruction 09/29/2010  . SKIN LESION 06/02/2007  . 1St degree AV block   . Sinus bradycardia     asymptomatic   . Cervical disc disease   . Subdural hematoma (HCC)     Subdural hematoma secondary to falling off a ladder  April, 2014,  medical therapy,  subdural did not expand while blood levels of ddual antiplatelet therapy decreased over one week.  Episode of chest pain in the hospital,, dual antiplatelet therapy restarted approximately one week after it was held  . Carotid bruit     April, 2014  . Syncope     The patient lost consciousness and hit his head July 09 2012, not clear yet if he had true syncope  . Carotid artery disease ([place])     Doppler, May, 2014, [id number] R. ICA, [id number] LICA, with recent syncope patient sent for consult with vascular surgery  . Fall at home July 09, 2012  . Concussion July 09, 2012    Scalp  . Hearing loss 2014   Past Surgical History  Procedure Laterality Date  . C-spine disc surgery  03/2006  . C4-5 surgery  01/2009  . Cardiac catheterization  July 2012    stent placed  X's  4  . Appendectomy    .  Tonsillectomy    . Coccyx fracture surgery  1997  . Coronary artery bypass graft  1992  . Barretts esophagus   20/20/09  . Bladder neck obstruction  09/29/10  . Endarterectomy Left 09/07/2012    Procedure: ENDARTERECTOMY CAROTID with patch angioplasty;  Surgeon: Mal Misty, MD;  Location: [facility];  Service: Vascular;  Laterality: Left;  . Left heart catheterization with coronary/graft angiogram N/A 12/21/2011    Procedure: LEFT HEART CATHETERIZATION WITH Beatrix Fetters;  Surgeon: Hillary Bow, MD;  Location: [facility] CATH LAB;  Service: Cardiovascular;  Laterality: N/A;    reports that he has quit smoking. His smoking use included Cigarettes. He started smoking about 60 years ago. He has never used smokeless tobacco. He reports that he  drinks alcohol. He reports that he does not use illicit drugs. family history includes Diabetes in his mother and sister; Heart attack in his father; Heart disease in his father and mother; Hyperlipidemia in his son; Hypertension in his son and son; Kidney disease in his sister. Allergies  Allergen Reactions  . Atenolol Other (See Comments)    depression  . Rosuvastatin Other (See Comments)    Severe hip pain  . Simvastatin Other (See Comments)    Severe hip pain  . Ultram [Tramadol] Nausea And Vomiting  . Zofran [Ondansetron Hcl] Nausea Only and Other (See Comments)    Severe nausea and constipation  . Adhesive [Tape] Other (See Comments)    Breakdown skin  . Codeine Other (See Comments)    Crazy feeling  . Ezetimibe-Simvastatin Other (See Comments)    Severe hip pain   Current Outpatient Prescriptions on File Prior to Visit  Medication Sig Dispense Refill  . aspirin (ASPIRIN EC) 81 MG EC tablet Take 81 mg by mouth daily.      . Cholecalciferol (VITAMIN D3) 2000 UNITS TABS Take 1 tablet by mouth daily.     . clopidogrel (PLAVIX) 75 MG tablet TAKE 1 TABLET (75 MG TOTAL) BY MOUTH DAILY. 90 tablet 1  . diphenhydrAMINE (BENADRYL) 25 MG tablet Take 25 mg by mouth daily as needed for allergies.     . Fluticasone-Salmeterol (ADVAIR HFA IN) Inhale into the lungs as directed.    Marland Kitchen glucose blood (ONE TOUCH ULTRA TEST) test strip Use as instructed once daily to check blood sugar.  Diagnosis code 250.02 100 each 2  . metoprolol succinate (TOPROL-XL) 25 MG 24 hr tablet TAKE 1/2 TABLET BY MOUTH TWICE A DAY 90 tablet 1  . montelukast (SINGULAIR) 10 MG tablet TAKE 1 TABLET (10 MG TOTAL) BY MOUTH AT BEDTIME. 90 tablet 3  . niacin (NIASPAN) 1000 MG CR tablet TAKE 1 TABLET BY MOUTH AT BEDTIME. TAKE WITH ASPIRIN 90 tablet 1  . nitroGLYCERIN (NITROLINGUAL) 0.4 MG/SPRAY spray Place 1 spray under the tongue every 5 (five) minutes as needed for chest pain. 12 g 0  . pantoprazole (PROTONIX) 40 MG tablet TAKE  1 TABLET BY MOUTH ONCE DAILY 90 tablet 1  . pravastatin (PRAVACHOL) 40 MG tablet TAKE 1 TABLET BY MOUTH EVERY EVENING. 90 tablet 2  . tamsulosin (FLOMAX) 0.4 MG CAPS Take 1 capsule (0.4 mg total) by mouth daily after supper. 90 capsule 3   No current facility-administered medications on file prior to visit.    Review of Systems Constitutional: Negative for increased diaphoresis, other activity, appetite or siginficant weight change other than noted HENT: Negative for worsening hearing loss, ear pain, facial swelling, mouth sores and neck stiffness.   Eyes: Negative  for other worsening pain, redness or visual disturbance.  Respiratory: Negative for shortness of breath and wheezing  Cardiovascular: Negative for chest pain and palpitations.  Gastrointestinal: Negative for diarrhea, blood in stool, abdominal distention or other pain Genitourinary: Negative for hematuria, flank pain or change in urine volume.  Musculoskeletal: Negative for myalgias or other joint complaints.  Skin: Negative for color change and wound or drainage.  Neurological: Negative for syncope and numbness. other than noted Hematological: Negative for adenopathy. or other swelling Psychiatric/Behavioral: Negative for hallucinations, SI, self-injury, decreased concentration or other worsening agitation.      Objective:   Physical Exam BP 138/80 mmHg  Pulse 70  Temp(Src) 98.2 F (36.8 C) (Oral)  Resp 20  Wt 218 lb (98.884 kg)  SpO2 94% VS noted,  Constitutional: Pt is oriented to person, place, and time. Appears well-developed and well-nourished, in no significant distress Head: Normocephalic and atraumatic.  Right Ear: External ear normal.  Left Ear: External ear normal.  Nose: Nose normal.  Mouth/Throat: Oropharynx is clear and moist.  Eyes: Conjunctivae and EOM are normal. Pupils are equal, round, and reactive to light.  Neck: Normal range of motion. Neck supple. No JVD present. No tracheal deviation present  or significant neck LA or mass Cardiovascular: Normal rate, regular rhythm, normal heart sounds and intact distal pulses.   Pulmonary/Chest: Effort normal and breath sounds without rales or wheezing  Abdominal: Soft. Bowel sounds are normal. NT. No HSM  Musculoskeletal: Normal range of motion. Exhibits no edema.  Lymphadenopathy:  Has no cervical adenopathy.  Neurological: Pt is alert and oriented to person, place, and time. Pt has normal reflexes. No cranial nerve deficit. Motor grossly intact Skin: Skin is warm and dry. No rash noted.  Psychiatric:  Has normal mood and affect. Behavior is normal.     Assessment & Plan:

## 2015-05-30 NOTE — Addendum Note (Signed)
Addended by: Della Goo C on: 05/30/2015 04:55 PM   Modules accepted: Orders

## 2015-05-30 NOTE — Patient Instructions (Signed)
You had the flu shot today  Please continue all other medications as before, and refills have been done if requested - including the new glucometer  Please have the pharmacy call if your meter is not covered by your insurance  Please have the pharmacy call with any other refills you may need.  Please continue your efforts at being more active, low cholesterol diet, and weight control.  You are otherwise up to date with prevention measures today.  Please keep your appointments with your specialists as you may have planned  Please go to the LAB in the Basement (turn left off the elevator) for the tests to be done today  You will be contacted by phone if any changes need to be made immediately.  Otherwise, you will receive a letter about your results with an explanation, but please check with MyChart first.  Please remember to sign up for MyChart if you have not done so, as this will be important to you in the future with finding out test results, communicating by private email, and scheduling acute appointments online when needed.  Please return in 6 months, or sooner if needed, with Lab testing done 3-5 days before

## 2015-09-01 ENCOUNTER — Ambulatory Visit (INDEPENDENT_AMBULATORY_CARE_PROVIDER_SITE_OTHER): Payer: Medicare Other | Admitting: Family

## 2015-09-01 ENCOUNTER — Encounter: Payer: Self-pay | Admitting: Family

## 2015-09-01 ENCOUNTER — Ambulatory Visit (INDEPENDENT_AMBULATORY_CARE_PROVIDER_SITE_OTHER)
Admission: RE | Admit: 2015-09-01 | Discharge: 2015-09-01 | Disposition: A | Discharge status: Home / Self Care | Payer: Medicare Other | Source: Ambulatory Visit | Attending: Family | Admitting: Family

## 2015-09-01 VITALS — BP 128/90 | HR 69 | Temp 98.0°F | Ht 71.0 in | Wt 221.0 lb

## 2015-09-01 DIAGNOSIS — S0093XA Contusion of unspecified part of head, initial encounter: Secondary | ICD-10-CM

## 2015-09-01 NOTE — Progress Notes (Signed)
Subjective:    Patient ID: Sean Lozano, male    DOB: Mar 10, 1934, 82 y.o.   MRN: [id number]   Sean Lozano is a 82 y.o. male who presents today for an acute visit.    HPI Comments: Patient here for evaluation of fall after he fell out of bed 3 days ago.He was asleep and fell OOB. NO LOC.  Bruising on the left ear with a laceration to ear. Bruise to right side of face and LUE.   Denies exertional chest pain or pressure, numbness or tingling radiating to left arm or jaw, palpitations, dizziness, frequent headaches, changes in vision, or shortness of breath.    Patient is a history of CAD, syncope. He is on dual antiplatelet therapy and also has a cyst history of subdural hematoma.  Past Medical History  Diagnosis Date  . CAD (coronary artery disease)     a. CABG x 6 in 1992. b. s/p DES x 2 to SVG -> OM1/OM2 in 09/2010. c. s/p DES to mid LAD 10/2010. d. NSTEMI (off DAPT) s/p PTCA of instent renarrowing of DES in SVG-OM, Recs for lifelong DAPT   . Hyperlipidemia     intolerance to Crestor Zocor Vytorin. He tolerates Pravachol...low HDL  . GERD (gastroesophageal reflux disease)   . Rosacea   . Prostatitis   . Depression   . Benign prostatic hypertrophy   . Asthma   . Paresthesias     successful surgery by Dr. Earnie Larsson  . Arthritis   . Diabetes mellitus     type II-diet  . Diverticulosis   . Hx of CABG     1992  . Ejection fraction     EF 60%, echo, June, 2012  . BARRETTS ESOPHAGUS 06/02/2007  . Bladder neck obstruction 09/29/2010  . SKIN LESION 06/02/2007  . 1St degree AV block   . Sinus bradycardia     asymptomatic   . Cervical disc disease   . Subdural hematoma (HCC)     Subdural hematoma secondary to falling off a ladder  April, 2014,  medical therapy,  subdural did not expand while blood levels of ddual antiplatelet therapy decreased over one week.  Episode of chest pain in the hospital,, dual antiplatelet therapy restarted approximately one week after it was held  .  Carotid bruit     April, 2014  . Syncope     The patient lost consciousness and hit his head July 09 2012, not clear yet if he had true syncope  . Carotid artery disease ([place])     Doppler, May, 2014, 1-27% R. ICA, 51-70% LICA, with recent syncope patient sent for consult with vascular surgery  . Fall at home July 09, 2012  . Concussion July 09, 2012    Scalp  . Hearing loss 2014   Allergies: Atenolol; Rosuvastatin; Simvastatin; Ultram; Zofran; Adhesive; Codeine; and Ezetimibe-simvastatin Current Outpatient Prescriptions on File Prior to Visit  Medication Sig Dispense Refill  . aspirin (ASPIRIN EC) 81 MG EC tablet Take 81 mg by mouth daily.      . Blood Glucose Monitoring Suppl (ONE TOUCH ULTRA 2) w/Device KIT Use as directed 1 each 0  . Cholecalciferol (VITAMIN D3) 2000 UNITS TABS Take 1 tablet by mouth daily.     . clopidogrel (PLAVIX) 75 MG tablet TAKE 1 TABLET (75 MG TOTAL) BY MOUTH DAILY. 90 tablet 1  . diphenhydrAMINE (BENADRYL) 25 MG tablet Take 25 mg by mouth daily as needed for allergies.     Marland Kitchen  Fluticasone-Salmeterol (ADVAIR HFA IN) Inhale into the lungs as directed.    Marland Kitchen glucose blood (ONE TOUCH TEST STRIPS) test strip Use as instructed 100 each 12  . glucose blood (ONE TOUCH ULTRA TEST) test strip Use as instructed once daily to check blood sugar.  Diagnosis code 250.02 100 each 2  . Lancets MISC Use as directed 1 per day 100 each 11  . metoprolol succinate (TOPROL-XL) 25 MG 24 hr tablet TAKE 1/2 TABLET BY MOUTH TWICE A DAY 90 tablet 1  . mometasone-formoterol (DULERA) 100-5 MCG/ACT AERO Inhale 2 puffs into the lungs 2 (two) times daily. 1 Inhaler 5  . montelukast (SINGULAIR) 10 MG tablet TAKE 1 TABLET (10 MG TOTAL) BY MOUTH AT BEDTIME. 90 tablet 3  . niacin (NIASPAN) 1000 MG CR tablet TAKE 1 TABLET BY MOUTH AT BEDTIME. TAKE WITH ASPIRIN 90 tablet 1  . pantoprazole (PROTONIX) 40 MG tablet TAKE 1 TABLET BY MOUTH ONCE DAILY 90 tablet 1  . pravastatin (PRAVACHOL) 40 MG tablet  TAKE 1 TABLET BY MOUTH EVERY EVENING. 90 tablet 2  . tamsulosin (FLOMAX) 0.4 MG CAPS Take 1 capsule (0.4 mg total) by mouth daily after supper. 90 capsule 3  . nitroGLYCERIN (NITROLINGUAL) 0.4 MG/SPRAY spray Place 1 spray under the tongue every 5 (five) minutes as needed for chest pain. (Patient not taking: Reported on 09/01/2015) 12 g 0  . nitroGLYCERIN (NITROSTAT) 0.4 MG SL tablet Place 1 tablet (0.4 mg total) under the tongue every 5 (five) minutes as needed for chest pain. (Patient not taking: Reported on 09/01/2015) 25 tablet 0   No current facility-administered medications on file prior to visit.    Social History  Substance Use Topics  . Smoking status: Former Smoker    Types: Cigarettes    Start date: 01/03/1955  . Smokeless tobacco: Never Used  . Alcohol Use: Yes     Comment: occ    Review of Systems  Constitutional: Negative for fever and chills.  Eyes: Negative for visual disturbance.  Respiratory: Negative for cough.   Cardiovascular: Negative for chest pain, palpitations and leg swelling.  Gastrointestinal: Negative for nausea, vomiting and diarrhea.  Genitourinary: Negative for dysuria.  Musculoskeletal: Negative for myalgias.  Skin: Negative for rash.  Neurological: Positive for headaches. Negative for dizziness.  Hematological: Bruises/bleeds easily.      Objective:    BP 128/90 mmHg  Pulse 69  Temp(Src) 98 F (36.7 C) (Oral)  Ht _0  (1.803 m)  Wt 221 lb (100.245 kg)  BMI 30.84 kg/m2  SpO2 94%   Physical Exam  Constitutional: He appears well-developed and well-nourished.  HENT:  Right Ear: Hearing, tympanic membrane and ear canal normal. No swelling or tenderness.  Left Ear: Hearing, tympanic membrane and ear canal normal. Left ear exhibits lacerations. There is swelling and tenderness.  Mouth/Throat: Uvula is midline, oropharynx is clear and moist and mucous membranes are normal. No posterior oropharyngeal edema or posterior oropharyngeal erythema.    Left ear tragus hematoma. 1-2 cm laceration posterior left ear. Well approximated. Dried blood. Mild swelling of the tragus.  Ecchymosis, mild swelling. right eyelid. No laceration.  Eyes: Conjunctivae, EOM and lids are normal. Pupils are equal, round, and reactive to light. Lids are everted and swept, no foreign bodies found. Right conjunctiva is not injected. Right conjunctiva has no hemorrhage. Left conjunctiva is not injected. Left conjunctiva has no hemorrhage.  Cardiovascular: Regular rhythm and normal heart sounds.   Pulmonary/Chest: Effort normal and breath sounds normal. No respiratory distress.  He has no wheezes. He has no rhonchi. He has no rales.  Lymphadenopathy:       Head (right side): No submental, no submandibular, no tonsillar, no preauricular, no posterior auricular and no occipital adenopathy present.       Head (left side): No submental, no submandibular, no tonsillar, no preauricular, no posterior auricular and no occipital adenopathy present.    He has no cervical adenopathy.  Neurological: He is alert. He has normal strength. No cranial nerve deficit or sensory deficit. He displays a negative Romberg sign.  Reflex Scores:      Bicep reflexes are 2+ on the right side and 2+ on the left side.      Patellar reflexes are 2+ on the right side and 2+ on the left side. Grip equal and strong bilateral upper extremities. Gait strong and steady. Able to perform  finger-to-nose without difficulty.   Skin: Skin is warm and dry.  Psychiatric: He has a normal mood and affect. His speech is normal and behavior is normal.  Vitals reviewed.      Assessment & Plan:   1. Head contusion, initial encounter  Reassured by normal neurologic exam. Patient has a history of subdural hematoma and is on Plavix and aspirin. Pending CT head.   I am having Mr. Nham maintain his aspirin, Vitamin D3, diphenhydrAMINE, glucose blood, tamsulosin, Fluticasone-Salmeterol (ADVAIR HFA IN),  nitroGLYCERIN, montelukast, niacin, clopidogrel, metoprolol succinate, pravastatin, pantoprazole, mometasone-formoterol, nitroGLYCERIN, glucose blood, ONE TOUCH ULTRA 2, and Lancets.   No orders of the defined types were placed in this encounter.     Start medications as prescribed and explained to patient on After Visit Summary ( AVS). Risks, benefits, and alternatives of the medications and treatment plan prescribed today were discussed, and patient expressed understanding.   Education regarding symptom management and diagnosis given to patient.   Follow-up:Plan follow-up as discussed or as needed if any worsening symptoms or change in condition.   Continue to follow with Cathlean Cower, MD for routine health maintenance.   Candida Peeling and I agreed with plan.   Mable Paris, FNP

## 2015-09-01 NOTE — Progress Notes (Signed)
Pre visit review using our clinic review tool, if applicable. No additional management support is needed unless otherwise documented below in the visit note. 

## 2015-09-01 NOTE — Patient Instructions (Signed)
CT head.  Will call with results.   If there is no improvement in your symptoms, or if there is any worsening of symptoms, or if you have any additional concerns, please return for re-evaluation; or, if we are closed, consider going to the Emergency Room for evaluation if symptoms urgent.

## 2015-10-08 ENCOUNTER — Encounter: Payer: Self-pay | Admitting: Family

## 2015-10-15 ENCOUNTER — Other Ambulatory Visit: Payer: Self-pay | Admitting: *Deleted

## 2015-10-15 MED ORDER — CLOPIDOGREL BISULFATE 75 MG PO TABS: ORAL_TABLET | ORAL | Status: DC

## 2015-10-18 ENCOUNTER — Other Ambulatory Visit: Payer: Self-pay | Admitting: Cardiology

## 2015-10-20 ENCOUNTER — Encounter: Payer: Self-pay | Admitting: Family

## 2015-10-20 ENCOUNTER — Ambulatory Visit (HOSPITAL_COMMUNITY)
Admission: RE | Admit: 2015-10-20 | Discharge: 2015-10-20 | Disposition: A | Discharge status: Home / Self Care | Payer: Medicare Other | Source: Ambulatory Visit | Attending: Family | Admitting: Family

## 2015-10-20 ENCOUNTER — Ambulatory Visit (INDEPENDENT_AMBULATORY_CARE_PROVIDER_SITE_OTHER): Payer: Medicare Other | Admitting: Family

## 2015-10-20 ENCOUNTER — Other Ambulatory Visit: Payer: Self-pay

## 2015-10-20 VITALS — BP 144/85 | HR 70 | Temp 96.2°F | Resp 18 | Ht 71.5 in | Wt 221.0 lb

## 2015-10-20 DIAGNOSIS — Z9889 Other specified postprocedural states: Secondary | ICD-10-CM

## 2015-10-20 DIAGNOSIS — I6522 Occlusion and stenosis of left carotid artery: Secondary | ICD-10-CM

## 2015-10-20 DIAGNOSIS — E785 Hyperlipidemia, unspecified: Secondary | ICD-10-CM | POA: Insufficient documentation

## 2015-10-20 DIAGNOSIS — Z48812 Encounter for surgical aftercare following surgery on the circulatory system: Secondary | ICD-10-CM

## 2015-10-20 DIAGNOSIS — F329 Major depressive disorder, single episode, unspecified: Secondary | ICD-10-CM | POA: Insufficient documentation

## 2015-10-20 DIAGNOSIS — K219 Gastro-esophageal reflux disease without esophagitis: Secondary | ICD-10-CM | POA: Insufficient documentation

## 2015-10-20 DIAGNOSIS — I2510 Atherosclerotic heart disease of native coronary artery without angina pectoris: Secondary | ICD-10-CM | POA: Diagnosis not present

## 2015-10-20 DIAGNOSIS — E119 Type 2 diabetes mellitus without complications: Secondary | ICD-10-CM | POA: Diagnosis not present

## 2015-10-20 MED ORDER — METOPROLOL SUCCINATE ER 25 MG PO TB24: 12.5000 mg | ORAL_TABLET | Freq: Two times a day (BID) | ORAL | Status: DC

## 2015-10-20 NOTE — Patient Instructions (Signed)
Stroke Prevention Some medical conditions and behaviors are associated with an increased chance of having a stroke. You may prevent a stroke by making healthy choices and managing medical conditions. HOW CAN I REDUCE MY RISK OF HAVING A STROKE?   Stay physically active. Get at least 30 minutes of activity on most or all days.  Do not smoke. It may also be helpful to avoid exposure to secondhand smoke.  Limit alcohol use. Moderate alcohol use is considered to be:  No more than 2 drinks per day for men.  No more than 1 drink per day for nonpregnant women.  Eat healthy foods. This involves:  Eating 5 or more servings of fruits and vegetables a day.  Making dietary changes that address high blood pressure (hypertension), high cholesterol, diabetes, or obesity.  Manage your cholesterol levels.  Making food choices that are high in fiber and low in saturated fat, trans fat, and cholesterol may control cholesterol levels.  Take any prescribed medicines to control cholesterol as directed by your health care provider.  Manage your diabetes.  Controlling your carbohydrate and sugar intake is recommended to manage diabetes.  Take any prescribed medicines to control diabetes as directed by your health care provider.  Control your hypertension.  Making food choices that are low in salt (sodium), saturated fat, trans fat, and cholesterol is recommended to manage hypertension.  Ask your health care provider if you need treatment to lower your blood pressure. Take any prescribed medicines to control hypertension as directed by your health care provider.  If you are 18-39 years of age, have your blood pressure checked every 3-5 years. If you are 40 years of age or older, have your blood pressure checked every year.  Maintain a healthy weight.  Reducing calorie intake and making food choices that are low in sodium, saturated fat, trans fat, and cholesterol are recommended to manage  weight.  Stop drug abuse.  Avoid taking birth control pills.  Talk to your health care provider about the risks of taking birth control pills if you are over 35 years old, smoke, get migraines, or have ever had a blood clot.  Get evaluated for sleep disorders (sleep apnea).  Talk to your health care provider about getting a sleep evaluation if you snore a lot or have excessive sleepiness.  Take medicines only as directed by your health care provider.  For some people, aspirin or blood thinners (anticoagulants) are helpful in reducing the risk of forming abnormal blood clots that can lead to stroke. If you have the irregular heart rhythm of atrial fibrillation, you should be on a blood thinner unless there is a good reason you cannot take them.  Understand all your medicine instructions.  Make sure that other conditions (such as anemia or atherosclerosis) are addressed. SEEK IMMEDIATE MEDICAL CARE IF:   You have sudden weakness or numbness of the face, arm, or leg, especially on one side of the body.  Your face or eyelid droops to one side.  You have sudden confusion.  You have trouble speaking (aphasia) or understanding.  You have sudden trouble seeing in one or both eyes.  You have sudden trouble walking.  You have dizziness.  You have a loss of balance or coordination.  You have a sudden, severe headache with no known cause.  You have new chest pain or an irregular heartbeat. Any of these symptoms may represent a serious problem that is an emergency. Do not wait to see if the symptoms will   go away. Get medical help at once. Call your local emergency services (911 in [place] Do not drive yourself to the hospital.   This information is not intended to replace advice given to you by your health care provider. Make sure you discuss any questions you have with your health care provider.   Document Released: 05/06/2004 Document Revised: 04/19/2014 Document Reviewed:  09/29/2012 Elsevier Interactive Patient Education 2016 [employer]  

## 2015-10-20 NOTE — Telephone Encounter (Signed)
Previous Dr Ron Parker patient now seeing Dr Burt Knack. Just wanted to verify if ok to refill the pantoprazole. Please advise. Thanks, MI

## 2015-10-20 NOTE — Telephone Encounter (Signed)
Sherren Mocha, MD at 02/16/2015 9:46 PM  metoprolol succinate (TOPROL-XL) 25 MG 24 hr tabletTAKE 1/2 TABLET BY MOUTH TWICE A DAY 3. Essential HTN: BP controlled on metoprolol succinate  Patient Instructions     Medication Instructions:  Your physician recommends that you continue on your current medications as directed. Please refer to the Current Medication list given to you today

## 2015-10-20 NOTE — Progress Notes (Signed)
Chief Complaint: Follow up Extracranial Carotid Artery Stenosis   History of Present Illness  Sean Lozano is a 82 y.o. male patient of Dr. Kellie Simmering who is status post left carotid endarterectomy on 09/07/2012. He returns today for routine follow up.  The patient denies any history of TIA or stroke symptoms, specifically the patient denies a history of amaurosis fugax or monocular blindness, denies a history unilateral of facial drooping, denies a history of hemiplegia, and denies a history of receptive or expressive aphasia.   Pt denies any tingling, numbness, pain, or cold feeling in either hand/arm.  He has lumbar disc issues with left LE radiculopathy.  He sees Dr. Burt Knack for his CAD, states he has a hx of a mild MI, had 6 vessel CABG in 1992.  The patient denies New Medical or Surgical History.  Pt Diabetic: diet controlled, states A1C under 7 for the last several years, was 6.21 May 2014 (review of records), 6.20 May 2015 Pt smoker: non-smoker  Pt meds include: Statin : yes ASA: yes Other anticoagulants/antiplatelets: Plavix   Past Medical History  Diagnosis Date  . CAD (coronary artery disease)     a. CABG x 6 in 1992. b. s/p DES x 2 to SVG -> OM1/OM2 in 09/2010. c. s/p DES to mid LAD 10/2010. d. NSTEMI (off DAPT) s/p PTCA of instent renarrowing of DES in SVG-OM, Recs for lifelong DAPT   . Hyperlipidemia     intolerance to Crestor Zocor Vytorin. He tolerates Pravachol...low HDL  . GERD (gastroesophageal reflux disease)   . Rosacea   . Prostatitis   . Depression   . Benign prostatic hypertrophy   . Asthma   . Paresthesias     successful surgery by Dr. Earnie Larsson  . Arthritis   . Diabetes mellitus     type II-diet  . Diverticulosis   . Hx of CABG     1992  . Ejection fraction     EF 60%, echo, June, 2012  . BARRETTS ESOPHAGUS 06/02/2007  . Bladder neck obstruction 09/29/2010  . SKIN LESION 06/02/2007  . 1St degree AV block   . Sinus bradycardia    asymptomatic   . Cervical disc disease   . Subdural hematoma (HCC)     Subdural hematoma secondary to falling off a ladder  April, 2014,  medical therapy,  subdural did not expand while blood levels of ddual antiplatelet therapy decreased over one week.  Episode of chest pain in the hospital,, dual antiplatelet therapy restarted approximately one week after it was held  . Carotid bruit     April, 2014  . Syncope     The patient lost consciousness and hit his head July 09 2012, not clear yet if he had true syncope  . Carotid artery disease ([place])     Doppler, May, 2014, 2-54% R. ICA, 27-06% LICA, with recent syncope patient sent for consult with vascular surgery  . Fall at home July 09, 2012  . Concussion July 09, 2012    Scalp  . Hearing loss 2014    Social History Social History  Substance Use Topics  . Smoking status: Former Smoker    Types: Cigarettes    Start date: 01/03/1955  . Smokeless tobacco: Never Used  . Alcohol Use: Yes     Comment: occ    Family History Family History  Problem Relation Age of Onset  . Heart disease Father     mother  . Heart attack Father   .  Aneurysm    . Hyperlipidemia    . Liver cancer      grandfather  . Diabetes Sister     mother  . Kidney disease Sister   . Hypertension Son   . Hyperlipidemia Son   . Hypertension Son   . Diabetes Mother   . Heart disease Mother     Surgical History Past Surgical History  Procedure Laterality Date  . C-spine disc surgery  03/2006  . C4-5 surgery  01/2009  . Cardiac catheterization  July 2012    stent placed  X's  4  . Appendectomy    . Tonsillectomy    . Coccyx fracture surgery  1997  . Coronary artery bypass graft  1992  . Barretts esophagus   20/20/09  . Bladder neck obstruction  09/29/10  . Endarterectomy Left 09/07/2012    Procedure: ENDARTERECTOMY CAROTID with patch angioplasty;  Surgeon: Mal Misty, MD;  Location: [facility];  Service: Vascular;  Laterality: Left;  . Left heart  catheterization with coronary/graft angiogram N/A 12/21/2011    Procedure: LEFT HEART CATHETERIZATION WITH Beatrix Fetters;  Surgeon: Hillary Bow, MD;  Location: [facility] CATH LAB;  Service: Cardiovascular;  Laterality: N/A;    Allergies  Allergen Reactions  . Atenolol Other (See Comments)    depression  . Rosuvastatin Other (See Comments)    Severe hip pain  . Simvastatin Other (See Comments)    Severe hip pain  . Ultram [Tramadol] Nausea And Vomiting  . Zofran [Ondansetron Hcl] Nausea Only and Other (See Comments)    Severe nausea and constipation  . Adhesive [Tape] Other (See Comments)    Breakdown skin  . Codeine Other (See Comments)    Crazy feeling  . Ezetimibe-Simvastatin Other (See Comments)    Severe hip pain    Current Outpatient Prescriptions  Medication Sig Dispense Refill  . aspirin (ASPIRIN EC) 81 MG EC tablet Take 81 mg by mouth daily.      . Blood Glucose Monitoring Suppl (ONE TOUCH ULTRA 2) w/Device KIT Use as directed 1 each 0  . Cholecalciferol (VITAMIN D3) 2000 UNITS TABS Take 1 tablet by mouth daily.     . clopidogrel (PLAVIX) 75 MG tablet Take 1 tablet (75 MG) by mouth daily. 90 tablet 0  . diphenhydrAMINE (BENADRYL) 25 MG tablet Take 25 mg by mouth daily as needed for allergies.     Marland Kitchen glucose blood (ONE TOUCH TEST STRIPS) test strip Use as instructed 100 each 12  . glucose blood (ONE TOUCH ULTRA TEST) test strip Use as instructed once daily to check blood sugar.  Diagnosis code 250.02 100 each 2  . Lancets MISC Use as directed 1 per day 100 each 11  . metoprolol succinate (TOPROL-XL) 25 MG 24 hr tablet TAKE 1/2 TABLET BY MOUTH TWICE A DAY 90 tablet 1  . mometasone-formoterol (DULERA) 100-5 MCG/ACT AERO Inhale 2 puffs into the lungs 2 (two) times daily. 1 Inhaler 5  . montelukast (SINGULAIR) 10 MG tablet TAKE 1 TABLET (10 MG TOTAL) BY MOUTH AT BEDTIME. 90 tablet 3  . niacin (NIASPAN) 1000 MG CR tablet TAKE 1 TABLET BY MOUTH AT BEDTIME. TAKE WITH  ASPIRIN 90 tablet 1  . nitroGLYCERIN (NITROLINGUAL) 0.4 MG/SPRAY spray Place 1 spray under the tongue every 5 (five) minutes as needed for chest pain. 12 g 0  . nitroGLYCERIN (NITROSTAT) 0.4 MG SL tablet Place 1 tablet (0.4 mg total) under the tongue every 5 (five) minutes as needed for chest pain.  25 tablet 0  . pantoprazole (PROTONIX) 40 MG tablet TAKE 1 TABLET BY MOUTH ONCE DAILY 90 tablet 1  . pravastatin (PRAVACHOL) 40 MG tablet TAKE 1 TABLET BY MOUTH EVERY EVENING. 90 tablet 2  . tamsulosin (FLOMAX) 0.4 MG CAPS Take 1 capsule (0.4 mg total) by mouth daily after supper. 90 capsule 3   No current facility-administered medications for this visit.    Review of Systems : See HPI for pertinent positives and negatives.  Physical Examination  Filed Vitals:   10/20/15 1115 10/20/15 1118  BP: 148/84 144/85  Pulse: 70   Temp: 96.2 F (35.7 C)   TempSrc: Oral   Resp: 18   Height: 5' 11.5" (1.816 m)   Weight: 221 lb (100.245 kg)   SpO2: 94%    Body mass index is 30.4 kg/(m^2).    General: WDWN obese male in NAD GAIT: normal Eyes: PERRLA Pulmonary: Respirations are non-labored, CTAB Cardiac: regular rhythm, no detected murmur.  VASCULAR EXAM Carotid Bruits Right Left   Negative Negative   Aorta is not palpable. Radial pulses are 2+ palpable and equal.      LE Pulses Right Left   POPLITEAL not palpable  not palpable   POSTERIOR TIBIAL 1+ palpable  1+ palpable    DORSALIS PEDIS  ANTERIOR TIBIAL 1+ palpable  1+ palpable     Gastrointestinal: soft, nontender, BS WNL, no r/g, no palpable masses.  Musculoskeletal: No muscle atrophy/wasting. M/S 5/5 throughout, extremities without ischemic changes.  Neurologic: A&O X 3; Appropriate Affect, Speech is normal CN 2-12 intact, pain and light touch intact in  extremities, Motor exam as listed above.          Non-Invasive Vascular Imaging CAROTID DUPLEX 10/20/2015   Right ICA: <40% stenosis. Left ICA: CEA site with no restenosis. No significant change compared to exam of 10/17/14.  Assessment: TYTAN SANDATE is a 82 y.o. male who is status post left carotid endarterectomy on 09/07/2012. He has no history of stroke or TIA. Today's carotid Duplex suggests minimal (<40%) stenosis of the right internal carotid artery and left ICA with no restenosis since the CEA. No significant change compared to exam of 10/17/14.   Plan: Follow-up in 1 year with Carotid Duplex scan.   I discussed in depth with the patient the nature of atherosclerosis, and emphasized the importance of maximal medical management including strict control of blood pressure, blood glucose, and lipid levels, obtaining regular exercise, and continued cessation of smoking.  The patient is aware that without maximal medical management the underlying atherosclerotic disease process will progress, limiting the benefit of any interventions. The patient was given information about stroke prevention and what symptoms should prompt the patient to seek immediate medical care. Thank you for allowing [place] to participate in this patient's care.  Clemon Chambers, RN, MSN, FNP-C [facility] Office: [facility] Physician: Kellie Simmering  10/20/2015 11:40 AM

## 2015-10-23 NOTE — Telephone Encounter (Signed)
OK to refill but please include note stating pt needs to make an appointment for further refills.

## 2015-10-24 ENCOUNTER — Other Ambulatory Visit: Payer: Self-pay | Admitting: *Deleted

## 2015-10-24 ENCOUNTER — Encounter: Payer: Self-pay | Admitting: Cardiovascular Disease

## 2015-10-24 MED ORDER — NIACIN ER (ANTIHYPERLIPIDEMIC) 1000 MG PO TBCR: EXTENDED_RELEASE_TABLET | ORAL | Status: DC

## 2015-11-26 ENCOUNTER — Other Ambulatory Visit (INDEPENDENT_AMBULATORY_CARE_PROVIDER_SITE_OTHER): Payer: Medicare Other

## 2015-11-26 DIAGNOSIS — E119 Type 2 diabetes mellitus without complications: Secondary | ICD-10-CM | POA: Diagnosis not present

## 2015-11-26 LAB — BASIC METABOLIC PANEL
BUN: 17 mg/dL (ref 6–23)
CO2: 28 mEq/L (ref 19–32)
Calcium: 9.8 mg/dL (ref 8.4–10.5)
Chloride: 104 mEq/L (ref 96–112)
Creatinine, Ser: 1.17 mg/dL (ref 0.40–1.50)
GFR: 63.66 mL/min (ref >60.00)
Glucose, Bld: 124 mg/dL — ABNORMAL HIGH (ref 70–99)
Potassium: 4 mEq/L (ref 3.5–5.1)
Sodium: 138 mEq/L (ref 135–145)

## 2015-11-26 LAB — LIPID PANEL
Cholesterol: 128 mg/dL (ref 0–200)
HDL: 43.3 mg/dL (ref >39.00)
LDL Cholesterol: 70 mg/dL (ref 0–99)
NonHDL: 85.07
Total CHOL/HDL Ratio: 3
Triglycerides: 77 mg/dL (ref 0.0–149.0)
VLDL: 15.4 mg/dL (ref 0.0–40.0)

## 2015-11-26 LAB — HEPATIC FUNCTION PANEL
ALT: 22 U/L (ref 0–53)
AST: 21 U/L (ref 0–37)
Albumin: 4.2 g/dL (ref 3.5–5.2)
Alkaline Phosphatase: 59 U/L (ref 39–117)
Bilirubin, Direct: 0.2 mg/dL (ref 0.0–0.3)
Total Bilirubin: 0.7 mg/dL (ref 0.2–1.2)
Total Protein: 7.1 g/dL (ref 6.0–8.3)

## 2015-11-26 LAB — HEMOGLOBIN A1C: Hgb A1c MFr Bld: 6.9 % — ABNORMAL HIGH (ref 4.6–6.5)

## 2015-11-27 ENCOUNTER — Encounter: Payer: Self-pay | Admitting: Internal Medicine

## 2015-11-27 ENCOUNTER — Ambulatory Visit (INDEPENDENT_AMBULATORY_CARE_PROVIDER_SITE_OTHER): Payer: Medicare Other | Admitting: Internal Medicine

## 2015-11-27 VITALS — BP 132/70 | HR 76 | Temp 98.0°F | Resp 20 | Wt 216.0 lb

## 2015-11-27 DIAGNOSIS — E119 Type 2 diabetes mellitus without complications: Secondary | ICD-10-CM

## 2015-11-27 DIAGNOSIS — J4520 Mild intermittent asthma, uncomplicated: Secondary | ICD-10-CM

## 2015-11-27 DIAGNOSIS — Z0001 Encounter for general adult medical examination with abnormal findings: Secondary | ICD-10-CM

## 2015-11-27 DIAGNOSIS — E785 Hyperlipidemia, unspecified: Secondary | ICD-10-CM | POA: Diagnosis not present

## 2015-11-27 DIAGNOSIS — F329 Major depressive disorder, single episode, unspecified: Secondary | ICD-10-CM | POA: Diagnosis not present

## 2015-11-27 DIAGNOSIS — F32A Depression, unspecified: Secondary | ICD-10-CM

## 2015-11-27 DIAGNOSIS — R6889 Other general symptoms and signs: Secondary | ICD-10-CM

## 2015-11-27 NOTE — Assessment & Plan Note (Signed)
stable overall by history and exam, recent data reviewed with pt, and pt to continue medical treatment as before,  to f/u any worsening symptoms or concerns Lab Results  Component Value Date   WBC 11.2 (H) 05/30/2015   HGB 16.8 05/30/2015   HCT 49.3 05/30/2015   PLT 187.0 05/30/2015   GLUCOSE 124 (H) 11/26/2015   CHOL 128 11/26/2015   TRIG 77.0 11/26/2015   HDL 43.30 11/26/2015   LDLDIRECT 87.6 09/19/2008   LDLCALC 70 11/26/2015   ALT 22 11/26/2015   AST 21 11/26/2015   NA 138 11/26/2015   K 4.0 11/26/2015   CL 104 11/26/2015   CREATININE 1.17 11/26/2015   BUN 17 11/26/2015   CO2 28 11/26/2015   TSH 2.54 05/30/2015   PSA 1.47 11/20/2013   INR 1.01 09/05/2012   HGBA1C 6.9 (H) 11/26/2015   MICROALBUR <0.7 05/30/2015

## 2015-11-27 NOTE — Assessment & Plan Note (Signed)
Goal < 70,  Lab Results  Component Value Date   LDLCALC 70 11/26/2015   stable overall by history and exam, recent data reviewed with pt, and pt to continue medical treatment as before,  to f/u any worsening symptoms or concerns

## 2015-11-27 NOTE — Assessment & Plan Note (Signed)
stable overall by history and exam, recent data reviewed with pt, and pt to continue medical treatment as before,  to f/u any worsening symptoms or concerns Lab Results  Component Value Date   HGBA1C 6.9 (H) 11/26/2015   Currently diet controlled, cont to follow

## 2015-11-27 NOTE — Assessment & Plan Note (Signed)
stable overall by history and exam, recent data reviewed with pt, and pt to continue medical treatment as before,  to f/u any worsening symptoms or concerns @LASTSAO2(3)@  

## 2015-11-27 NOTE — Patient Instructions (Signed)
Please continue all other medications as before, and refills have been done if requested.  Please have the pharmacy call with any other refills you may need.  Please continue your efforts at being more active, low cholesterol diet, and weight control.  You are otherwise up to date with prevention measures today.  Please keep your appointments with your specialists as you may have planned  Please return in 6 months, or sooner if needed, with Lab testing done 3-5 days before  

## 2015-11-27 NOTE — Progress Notes (Signed)
Pre visit review using our clinic review tool, if applicable. No additional management support is needed unless otherwise documented below in the visit note. 

## 2015-11-27 NOTE — Progress Notes (Signed)
Subjective:    Patient ID: Sean Lozano, male    DOB: Aug 16, 1933, 82 y.o.   MRN: [id number]  HPI  Here to f/u; overall doing ok,  Pt denies chest pain, increasing sob or doe, wheezing, orthopnea, PND, increased LE swelling, palpitations, dizziness or syncope.  Pt denies new neurological symptoms such as new headache, or facial or extremity weakness or numbness.  Pt denies polydipsia, polyuria, or low sugar episode.   Pt denies new neurological symptoms such as new headache, or facial or extremity weakness or numbness.   Pt states overall good compliance with meds, mostly trying to follow appropriate diet, with wt overall stable,  but little exercise however. Taking care of wife with dementia, quite stressful at time, yesterday she asked over 10 times when his appt today was. Has son to help if needed, and she has no other behvioral issues or incontinence  Denies worsening depressive symptoms, suicidal ideation, or panic Past Medical History:  Diagnosis Date  . 1St degree AV block   . Arthritis   . Asthma   . BARRETTS ESOPHAGUS 06/02/2007  . Benign prostatic hypertrophy   . Bladder neck obstruction 09/29/2010  . CAD (coronary artery disease)    a. CABG x 6 in 1992. b. s/p DES x 2 to SVG -> OM1/OM2 in 09/2010. c. s/p DES to mid LAD 10/2010. d. NSTEMI (off DAPT) s/p PTCA of instent renarrowing of DES in SVG-OM, Recs for lifelong DAPT   . Carotid artery disease ([place])    Doppler, May, 2014, 6-73% R. ICA, 41-93% LICA, with recent syncope patient sent for consult with vascular surgery  . Carotid bruit    April, 2014  . Cervical disc disease   . Concussion July 09, 2012   Scalp  . Depression   . Diabetes mellitus    type II-diet  . Diverticulosis   . Ejection fraction    EF 60%, echo, June, 2012  . Fall at home July 09, 2012  . GERD (gastroesophageal reflux disease)   . Hearing loss 2014  . Hx of CABG    1992  . Hyperlipidemia    intolerance to Crestor Zocor Vytorin. He tolerates  Pravachol...low HDL  . Paresthesias    successful surgery by Dr. Earnie Larsson  . Prostatitis   . Rosacea   . Sinus bradycardia    asymptomatic   . SKIN LESION 06/02/2007  . Subdural hematoma (HCC)    Subdural hematoma secondary to falling off a ladder  April, 2014,  medical therapy,  subdural did not expand while blood levels of ddual antiplatelet therapy decreased over one week.  Episode of chest pain in the hospital,, dual antiplatelet therapy restarted approximately one week after it was held  . Syncope    The patient lost consciousness and hit his head July 09 2012, not clear yet if he had true syncope   Past Surgical History:  Procedure Laterality Date  . APPENDECTOMY    . Barretts Esophagus   20/20/09  . Bladder neck obstruction  09/29/10  . C-spine disc surgery  03/2006  . C4-5 surgery  01/2009  . CARDIAC CATHETERIZATION  July 2012   stent placed  X's  4  . COCCYX FRACTURE SURGERY  1997  . CORONARY ARTERY BYPASS GRAFT  1992  . ENDARTERECTOMY Left 09/07/2012   Procedure: ENDARTERECTOMY CAROTID with patch angioplasty;  Surgeon: Mal Misty, MD;  Location: [place];  Service: Vascular;  Laterality: Left;  . LEFT HEART CATHETERIZATION WITH CORONARY/GRAFT  ANGIOGRAM N/A 12/21/2011   Procedure: LEFT HEART CATHETERIZATION WITH Beatrix Fetters;  Surgeon: Hillary Bow, MD;  Location: [facility] C[facility] LAB;  Service: Cardiovascular;  Laterality: N/A;  . TONSILLECTOMY      reports that he has quit smoking. His smoking use included Cigarettes. He started smoking about 60 years ago. He has never used smokeless tobacco. He reports that he drinks alcohol. He reports that he does not use drugs. family history includes Diabetes in his mother and sister; Heart attack in his father; Heart disease in his father and mother; Hyperlipidemia in his son; Hypertension in his son and son; Kidney disease in his sister. Allergies  Allergen Reactions  . Atenolol Other (See Comments)    depression  .  Rosuvastatin Other (See Comments)    Severe hip pain  . Simvastatin Other (See Comments)    Severe hip pain  . Ultram [Tramadol] Nausea And Vomiting  . Zofran [Ondansetron Hcl] Nausea Only and Other (See Comments)    Severe nausea and constipation  . Adhesive [Tape] Other (See Comments)    Breakdown skin  . Codeine Other (See Comments)    Crazy feeling  . Ezetimibe-Simvastatin Other (See Comments)    Severe hip pain   Current Outpatient Prescriptions on File Prior to Visit  Medication Sig Dispense Refill  . aspirin (ASPIRIN EC) 81 MG EC tablet Take 81 mg by mouth daily.      . Blood Glucose Monitoring Suppl (ONE TOUCH ULTRA 2) w/Device KIT Use as directed 1 each 0  . Cholecalciferol (VITAMIN D3) 2000 UNITS TABS Take 1 tablet by mouth daily.     . clopidogrel (PLAVIX) 75 MG tablet Take 1 tablet (75 MG) by mouth daily. 90 tablet 0  . diphenhydrAMINE (BENADRYL) 25 MG tablet Take 25 mg by mouth daily as needed for allergies.     Marland Kitchen glucose blood (ONE TOUCH TEST STRIPS) test strip Use as instructed 100 each 12  . glucose blood (ONE TOUCH ULTRA TEST) test strip Use as instructed once daily to check blood sugar.  Diagnosis code 250.02 100 each 2  . Lancets MISC Use as directed 1 per day 100 each 11  . metoprolol succinate (TOPROL-XL) 25 MG 24 hr tablet Take 0.5 tablets (12.5 mg total) by mouth 2 (two) times daily. 90 tablet 0  . mometasone-formoterol (DULERA) 100-5 MCG/ACT AERO Inhale 2 puffs into the lungs 2 (two) times daily. 1 Inhaler 5  . montelukast (SINGULAIR) 10 MG tablet TAKE 1 TABLET (10 MG TOTAL) BY MOUTH AT BEDTIME. 90 tablet 3  . niacin (NIASPAN) 1000 MG CR tablet TAKE 1 TABLET BY MOUTH AT BEDTIME. TAKE WITH ASPIRIN 90 tablet 1  . nitroGLYCERIN (NITROLINGUAL) 0.4 MG/SPRAY spray Place 1 spray under the tongue every 5 (five) minutes as needed for chest pain. 12 g 0  . nitroGLYCERIN (NITROSTAT) 0.4 MG SL tablet Place 1 tablet (0.4 mg total) under the tongue every 5 (five) minutes as  needed for chest pain. 25 tablet 0  . pantoprazole (PROTONIX) 40 MG tablet TAKE 1 TABLET BY MOUTH ONCE DAILY 90 tablet 0  . pravastatin (PRAVACHOL) 40 MG tablet TAKE 1 TABLET BY MOUTH EVERY EVENING. 90 tablet 2  . tamsulosin (FLOMAX) 0.4 MG CAPS Take 1 capsule (0.4 mg total) by mouth daily after supper. 90 capsule 3   No current facility-administered medications on file prior to visit.      Review of Systems  Constitutional: Negative for unusual diaphoresis or night sweats HENT: Negative for ear  swelling or discharge Eyes: Negative for worsening visual haziness  Respiratory: Negative for choking and stridor.   Gastrointestinal: Negative for distension or worsening eructation Genitourinary: Negative for retention or change in urine volume.  Musculoskeletal: Negative for other MSK pain or swelling Skin: Negative for color change and worsening wound Neurological: Negative for tremors and numbness other than noted  Psychiatric/Behavioral: Negative for decreased concentration or agitation other than above       Objective:   Physical Exam BP 132/70   Pulse 76   Temp 98 F (36.7 C) (Oral)   Resp 20   Wt 216 lb (98 kg)   SpO2 94%   BMI 29.71 kg/m  VS noted, not ill appearing Constitutional: Pt appears in no apparent distress HENT: Head: NCAT.  Right Ear: External ear normal.  Left Ear: External ear normal.  Eyes: . Pupils are equal, round, and reactive to light. Conjunctivae and EOM are normal Neck: Normal range of motion. Neck supple.  Cardiovascular: Normal rate and regular rhythm.   Pulmonary/Chest: Effort normal and breath sounds without rales or wheezing.  Abd:  Soft, NT, ND, + BS Neurological: Pt is alert. Not confused , motor grossly intact Skin: Skin is warm. No rash, no LE edema Psychiatric: Pt behavior is normal. No agitation. not depressed affect     Assessment & Plan:

## 2016-01-14 ENCOUNTER — Other Ambulatory Visit: Payer: Self-pay | Admitting: Cardiovascular Disease

## 2016-01-16 ENCOUNTER — Other Ambulatory Visit: Payer: Self-pay | Admitting: Cardiovascular Disease

## 2016-01-25 ENCOUNTER — Other Ambulatory Visit: Payer: Self-pay | Admitting: Cardiovascular Disease

## 2016-01-29 ENCOUNTER — Other Ambulatory Visit: Payer: Self-pay | Admitting: Internal Medicine

## 2016-02-08 ENCOUNTER — Other Ambulatory Visit: Payer: Self-pay | Admitting: Internal Medicine

## 2016-02-09 ENCOUNTER — Other Ambulatory Visit: Payer: Self-pay | Admitting: Cardiovascular Disease

## 2016-02-09 MED ORDER — CLOPIDOGREL BISULFATE 75 MG PO TABS: ORAL_TABLET | ORAL | 0 refills | Status: DC

## 2016-02-10 ENCOUNTER — Other Ambulatory Visit: Payer: Self-pay | Admitting: *Deleted

## 2016-02-10 MED ORDER — MOMETASONE FURO-FORMOTEROL FUM 100-5 MCG/ACT IN AERO: 2.0000 | INHALATION_SPRAY | Freq: Two times a day (BID) | RESPIRATORY_TRACT | 5 refills | Status: DC

## 2016-02-16 ENCOUNTER — Encounter: Payer: Self-pay | Admitting: Cardiovascular Disease

## 2016-02-16 ENCOUNTER — Ambulatory Visit (INDEPENDENT_AMBULATORY_CARE_PROVIDER_SITE_OTHER): Payer: Medicare Other | Admitting: Cardiovascular Disease

## 2016-02-16 VITALS — BP 126/80 | HR 83 | Ht 71.5 in | Wt 216.0 lb

## 2016-02-16 DIAGNOSIS — I25810 Atherosclerosis of coronary artery bypass graft(s) without angina pectoris: Secondary | ICD-10-CM

## 2016-02-16 DIAGNOSIS — E785 Hyperlipidemia, unspecified: Secondary | ICD-10-CM | POA: Diagnosis not present

## 2016-02-16 MED ORDER — NITROGLYCERIN 0.4 MG SL SUBL: 0.4000 mg | SUBLINGUAL_TABLET | SUBLINGUAL | 1 refills | Status: DC | PRN

## 2016-02-16 MED ORDER — CLOPIDOGREL BISULFATE 75 MG PO TABS: ORAL_TABLET | ORAL | 3 refills | Status: DC

## 2016-02-16 MED ORDER — PANTOPRAZOLE SODIUM 40 MG PO TBEC: 40.0000 mg | DELAYED_RELEASE_TABLET | Freq: Every day | ORAL | 3 refills | Status: DC

## 2016-02-16 MED ORDER — PRAVASTATIN SODIUM 40 MG PO TABS: 40.0000 mg | ORAL_TABLET | Freq: Every evening | ORAL | 3 refills | Status: DC

## 2016-02-16 MED ORDER — METOPROLOL SUCCINATE ER 25 MG PO TB24: 12.5000 mg | ORAL_TABLET | Freq: Two times a day (BID) | ORAL | 3 refills | Status: DC

## 2016-02-16 NOTE — Progress Notes (Signed)
Cardiology Office Note Date:  02/16/2016   ID:  JAREEM PREBLE, DOB 1933/06/08, MRN [id number]  PCP:  Cathlean Cower, MD  Cardiologist:  Sherren Mocha, MD    Chief Complaint  Patient presents with  . Coronary Artery Disease   History of Present Illness: GLEN DIMSDALE is a 82 y.o. male who presents for follow-up of coronary artery disease.  He underwent CABG in 1992 and has undergone multiple PCI procedures most recently in 2012 and 2013. He is to be maintained on lifelong DAPT. LV function has been preserved. Other problems include Type II DM and hyperlipidemia. He has had a subdural hematoma after falling off of a ladder in 2014. He underwent carotid endarterectomy in 2014.   The patient is doing well. He's had no recurrence of chest pain or pressure. He denies symptoms of shortness of breath, leg swelling, orthopnea, PND, or heart palpitations. He's not been engaged in any regular exercise. He spends most of his time caring for his wife. She has severe narcolepsy. He is compliant with his medications and reports no recent changes.  Past Medical History:  Diagnosis Date  . 1st degree AV block   . Arthritis   . Asthma   . BARRETTS ESOPHAGUS 06/02/2007  . Benign prostatic hypertrophy   . Bladder neck obstruction 09/29/2010  . CAD (coronary artery disease)    a. CABG x 6 in 1992. b. s/p DES x 2 to SVG -> OM1/OM2 in 09/2010. c. s/p DES to mid LAD 10/2010. d. NSTEMI (off DAPT) s/p PTCA of instent renarrowing of DES in SVG-OM, Recs for lifelong DAPT   . Carotid artery disease ([place])    Doppler, May, 2014, [id number] R. ICA, [id number] LICA, with recent syncope patient sent for consult with vascular surgery  . Carotid bruit    April, 2014  . Cervical disc disease   . Concussion July 09, 2012   Scalp  . Depression   . Diabetes mellitus    type II-diet  . Diverticulosis   . Ejection fraction    EF 60%, echo, June, 2012  . Fall at home July 09, 2012  . GERD (gastroesophageal reflux disease)    . Hearing loss 2014  . Hx of CABG    1992  . Hyperlipidemia    intolerance to Crestor Zocor Vytorin. He tolerates Pravachol...low HDL  . Paresthesias    successful surgery by Dr. Earnie Larsson  . Prostatitis   . Rosacea   . Sinus bradycardia    asymptomatic   . SKIN LESION 06/02/2007  . Subdural hematoma ([facility])    Subdural hematoma secondary to falling off a ladder  April, 2014,  medical therapy,  subdural did not expand while blood levels of ddual antiplatelet therapy decreased over one week.  Episode of chest pain in the hospital,, dual antiplatelet therapy restarted approximately one week after it was held  . Syncope    The patient lost consciousness and hit his head July 09 2012, not clear yet if he had true syncope    Past Surgical History:  Procedure Laterality Date  . APPENDECTOMY    . Barretts Esophagus   20/20/09  . Bladder neck obstruction  09/29/10  . C-spine disc surgery  03/2006  . C4-5 surgery  01/2009  . CARDIAC CATHETERIZATION  July 2012   stent placed  X's  4  . COCCYX FRACTURE SURGERY  1997  . CORONARY ARTERY BYPASS GRAFT  1992  . ENDARTERECTOMY Left 09/07/2012   Procedure: ENDARTERECTOMY  CAROTID with patch angioplasty;  Surgeon: Mal Misty, MD;  Location: [facility];  Service: Vascular;  Laterality: Left;  . LEFT HEART CATHETERIZATION WITH CORONARY/GRAFT ANGIOGRAM N/A 12/21/2011   Procedure: LEFT HEART CATHETERIZATION WITH Beatrix Fetters;  Surgeon: Hillary Bow, MD;  Location[facility];  Service: Cardiovascular;  Laterality: N/A;  . TONSILLECTOMY      Current Outpatient Prescriptions  Medication Sig Dispense Refill  . aspirin (ASPIRIN EC) 81 MG EC tablet Take 81 mg by mouth daily.      . Cholecalciferol (VITAMIN D3) 2000 UNITS TABS Take 1 tablet by mouth daily.     . clopidogrel (PLAVIX) 75 MG tablet TAKE 1 TABLET (75 MG) BY MOUTH DAILY. 90 tablet 3  . diphenhydrAMINE (BENADRYL) 25 MG tablet Take 25 mg by mouth daily as needed for allergies.       Marland Kitchen FLUZONE HIGH-DOSE 0.5 ML SUSY Inject as directed once.  0  . metoprolol succinate (TOPROL-XL) 25 MG 24 hr tablet Take 0.5 tablets (12.5 mg total) by mouth 2 (two) times daily. 90 tablet 3  . mometasone-formoterol (DULERA) 100-5 MCG/ACT AERO Inhale 2 puffs into the lungs 2 (two) times daily. 1 Inhaler 5  . montelukast (SINGULAIR) 10 MG tablet TAKE 1 TABLET (10 MG TOTAL) BY MOUTH AT BEDTIME. 90 tablet 1  . niacin (NIASPAN) 1000 MG CR tablet TAKE 1 TABLET BY MOUTH AT BEDTIME. TAKE WITH ASPIRIN 90 tablet 1  . nitroGLYCERIN (NITROLINGUAL) 0.4 MG/SPRAY spray Place 1 spray under the tongue every 5 (five) minutes as needed for chest pain. 12 g 0  . nitroGLYCERIN (NITROSTAT) 0.4 MG SL tablet Place 1 tablet (0.4 mg total) under the tongue every 5 (five) minutes as needed for chest pain. 25 tablet 1  . pantoprazole (PROTONIX) 40 MG tablet Take 1 tablet (40 mg total) by mouth daily. 90 tablet 3  . pravastatin (PRAVACHOL) 40 MG tablet Take 1 tablet (40 mg total) by mouth every evening. 90 tablet 3  . tamsulosin (FLOMAX) 0.4 MG CAPS capsule TAKE 1 CAPSULE (0.4 MG TOTAL) BY MOUTH DAILY AFTER SUPPER. 90 capsule 3  . tamsulosin (FLOMAX) 0.4 MG CAPS Take 1 capsule (0.4 mg total) by mouth daily after supper. 90 capsule 3   No current facility-administered medications for this visit.     Allergies:   Atenolol; Rosuvastatin; Simvastatin; Ultram [tramadol]; Zofran [ondansetron hcl]; Adhesive [tape]; Codeine; and Ezetimibe-simvastatin   Social History:  The patient  reports that he has quit smoking. His smoking use included Cigarettes. He started smoking about 61 years ago. He has never used smokeless tobacco. He reports that he drinks alcohol. He reports that he does not use drugs.   Family History:  The patient's  family history includes Diabetes in his mother and sister; Heart attack in his father; Heart disease in his father and mother; Hyperlipidemia in his son; Hypertension in his son and son; Kidney disease  in his sister.   ROS:  Please see the history of present illness.  Otherwise, review of systems is positive for leg pain.  All other systems are reviewed and negative.   PHYSICAL EXAM: VS:  BP 126/80 (BP Location: Right Arm, Patient Position: Sitting, Cuff Size: Normal)   Pulse 83   Ht 5' 11.5" (1.816 m)   Wt 98 kg (216 lb)   BMI 29.71 kg/m  , BMI Body mass index is 29.71 kg/m. GEN: Well nourished, well developed, pleasant elderly male, in no acute distress  HEENT: normal  Neck: no  JVD, no masses. No carotid bruits Cardiac: RRR without murmur or gallop                Respiratory:  clear to auscultation bilaterally, normal work of breathing GI: soft, nontender, nondistended, + BS MS: no deformity or atrophy  Ext: no pretibial edema, pedal pulses 2+= bilaterally Skin: warm and dry, no rash Neuro:  Strength and sensation are intact Psych: euthymic mood, full affect  EKG:  EKG is ordered today. The ekg ordered today shows normal sinus rhythm with first-degree AV block, otherwise within normal limits.  Recent Labs: 05/30/2015: Hemoglobin 16.8; Platelets 187.0; TSH 2.54 11/26/2015: ALT 22; BUN 17; Creatinine, Ser 1.17; Potassium 4.0; Sodium 138   Lipid Panel     Component Value Date/Time   CHOL 128 11/26/2015 0910   TRIG 77.0 11/26/2015 0910   HDL 43.30 11/26/2015 0910   CHOLHDL 3 11/26/2015 0910   VLDL 15.4 11/26/2015 0910   LDLCALC 70 11/26/2015 0910   LDLDIRECT 87.6 09/19/2008 1546      Wt Readings from Last 3 Encounters:  02/16/16 98 kg (216 lb)  11/27/15 98 kg (216 lb)  10/20/15 100.2 kg (221 lb)     ASSESSMENT AND PLAN: 1.  CAD, native vessel and bypass graft disease: Patient maintained on long-term dual antiplatelet therapy with aspirin and clopidogrel. The patient is having no symptoms of angina and his medications are reviewed and no changes recommended today.  2. Carotid stenosis with no history of stroke: The patient is followed by vascular surgery with a  history of carotid endarterectomy.  3. Essential hypertension: Blood pressure remains well-controlled on metoprolol succinate.  4. Hyperlipidemia: Treated with a combination of niacin and pravastatin. Lipids from 11/26/2015 reviewed as above with an LDL of 70 mg/dL.  Current medicines are reviewed with the patient today.  The patient does not have concerns regarding medicines.  Labs/ tests ordered today include:   Orders Placed This Encounter  Procedures  . EKG 12-Lead    Disposition:   FU one year  Signed, Sherren Mocha, MD  02/16/2016 [facility] [place], [place], [place]  [address] Phone: [phone]; Fax: [phone]

## 2016-02-16 NOTE — Patient Instructions (Signed)

## 2016-04-25 ENCOUNTER — Other Ambulatory Visit: Payer: Self-pay | Admitting: Cardiovascular Disease

## 2016-04-30 ENCOUNTER — Telehealth: Payer: Self-pay | Admitting: Internal Medicine

## 2016-04-30 NOTE — Telephone Encounter (Signed)
[facility] called request PA start on mometasone-formoterol (DULERA) 100-5 MCG/ACT AERO. Please help, they send this request since last week.

## 2016-04-30 NOTE — Telephone Encounter (Signed)
PA initiated via covermymeds. Key [id number]

## 2016-05-07 ENCOUNTER — Telehealth: Payer: Self-pay | Admitting: Emergency Medicine

## 2016-05-07 NOTE — Telephone Encounter (Signed)
This is being processed. Waiting on approval

## 2016-05-07 NOTE — Telephone Encounter (Signed)
[facility] is calling regarding a PA for the medication mometasone-formoterol (DULERA) 100-5 MCG/ACT AERO. Please give them a call back about this thanks.

## 2016-05-09 ENCOUNTER — Other Ambulatory Visit: Payer: Self-pay | Admitting: Internal Medicine

## 2016-05-21 ENCOUNTER — Telehealth: Payer: Self-pay | Admitting: *Deleted

## 2016-05-21 NOTE — Telephone Encounter (Signed)
Yes.. Do you have a PA on him?..Sean Lozano

## 2016-05-21 NOTE — Telephone Encounter (Signed)
Pharmacy left msg on triage stating calling to check PA status on pt Dulera. Faxed over PA on 05/13/16...Sean Lozano

## 2016-05-24 ENCOUNTER — Other Ambulatory Visit (INDEPENDENT_AMBULATORY_CARE_PROVIDER_SITE_OTHER): Payer: Medicare Other

## 2016-05-24 DIAGNOSIS — E119 Type 2 diabetes mellitus without complications: Secondary | ICD-10-CM | POA: Diagnosis not present

## 2016-05-24 DIAGNOSIS — Z0001 Encounter for general adult medical examination with abnormal findings: Secondary | ICD-10-CM

## 2016-05-24 LAB — MICROALBUMIN / CREATININE URINE RATIO
Creatinine,U: 172.2 mg/dL
Microalb Creat Ratio: 0.4 mg/g (ref 0.0–30.0)
Microalb, Ur: 0.7 mg/dL (ref 0.0–1.9)

## 2016-05-24 LAB — URINALYSIS, ROUTINE W REFLEX MICROSCOPIC
Bilirubin Urine: NEGATIVE
Hgb urine dipstick: NEGATIVE
Leukocytes, UA: NEGATIVE
Nitrite: NEGATIVE
Specific Gravity, Urine: 1.015 (ref 1.000–1.030)
Total Protein, Urine: NEGATIVE
Urine Glucose: NEGATIVE
Urobilinogen, UA: 0.2 (ref 0.0–1.0)
pH: 5.5 (ref 5.0–8.0)

## 2016-05-24 LAB — HEPATIC FUNCTION PANEL
ALT: 17 U/L (ref 0–53)
AST: 20 U/L (ref 0–37)
Albumin: 4.1 g/dL (ref 3.5–5.2)
Alkaline Phosphatase: 59 U/L (ref 39–117)
Bilirubin, Direct: 0.2 mg/dL (ref 0.0–0.3)
Total Bilirubin: 1 mg/dL (ref 0.2–1.2)
Total Protein: 7.2 g/dL (ref 6.0–8.3)

## 2016-05-24 LAB — CBC WITH DIFFERENTIAL/PLATELET
Basophils Absolute: 0.1 10*3/uL (ref 0.0–0.1)
Basophils Relative: 0.5 % (ref 0.0–3.0)
Eosinophils Absolute: 0.3 10*3/uL (ref 0.0–0.7)
Eosinophils Relative: 2.8 % (ref 0.0–5.0)
HCT: 48.5 % (ref 39.0–52.0)
Hemoglobin: 16.6 g/dL (ref 13.0–17.0)
Lymphocytes Relative: 37.7 % (ref 12.0–46.0)
Lymphs Abs: 3.9 10*3/uL (ref 0.7–4.0)
MCHC: 34.3 g/dL (ref 30.0–36.0)
MCV: 100.4 fl — ABNORMAL HIGH (ref 78.0–100.0)
Monocytes Absolute: 0.8 10*3/uL (ref 0.1–1.0)
Monocytes Relative: 8 % (ref 3.0–12.0)
Neutro Abs: 5.3 10*3/uL (ref 1.4–7.7)
Neutrophils Relative %: 51 % (ref 43.0–77.0)
Platelets: 186 10*3/uL (ref 150.0–400.0)
RBC: 4.83 Mil/uL (ref 4.22–5.81)
RDW: 13 % (ref 11.5–15.5)
WBC: 10.4 10*3/uL (ref 4.0–10.5)

## 2016-05-24 LAB — BASIC METABOLIC PANEL
BUN: 16 mg/dL (ref 6–23)
CO2: 28 mEq/L (ref 19–32)
Calcium: 9.5 mg/dL (ref 8.4–10.5)
Chloride: 103 mEq/L (ref 96–112)
Creatinine, Ser: 1.22 mg/dL (ref 0.40–1.50)
GFR: 60.59 mL/min (ref >60.00)
Glucose, Bld: 131 mg/dL — ABNORMAL HIGH (ref 70–99)
Potassium: 4 mEq/L (ref 3.5–5.1)
Sodium: 138 mEq/L (ref 135–145)

## 2016-05-24 LAB — LIPID PANEL
Cholesterol: 138 mg/dL (ref 0–200)
HDL: 41.1 mg/dL (ref >39.00)
LDL Cholesterol: 74 mg/dL (ref 0–99)
NonHDL: 97.11
Total CHOL/HDL Ratio: 3
Triglycerides: 114 mg/dL (ref 0.0–149.0)
VLDL: 22.8 mg/dL (ref 0.0–40.0)

## 2016-05-24 LAB — HEMOGLOBIN A1C: Hgb A1c MFr Bld: 6.9 % — ABNORMAL HIGH (ref 4.6–6.5)

## 2016-05-24 LAB — TSH: TSH: 3.92 u[IU]/mL (ref 0.35–4.50)

## 2016-06-02 ENCOUNTER — Telehealth: Payer: Self-pay | Admitting: Emergency Medicine

## 2016-06-02 ENCOUNTER — Encounter: Payer: Self-pay | Admitting: Internal Medicine

## 2016-06-02 ENCOUNTER — Ambulatory Visit (INDEPENDENT_AMBULATORY_CARE_PROVIDER_SITE_OTHER): Payer: Medicare Other | Admitting: Internal Medicine

## 2016-06-02 VITALS — BP 128/82 | HR 73 | Temp 97.6°F | Ht 71.0 in | Wt 215.0 lb

## 2016-06-02 DIAGNOSIS — Z0000 Encounter for general adult medical examination without abnormal findings: Secondary | ICD-10-CM | POA: Diagnosis not present

## 2016-06-02 DIAGNOSIS — E119 Type 2 diabetes mellitus without complications: Secondary | ICD-10-CM

## 2016-06-02 MED ORDER — MOMETASONE FURO-FORMOTEROL FUM 100-5 MCG/ACT IN AERO: 2.0000 | INHALATION_SPRAY | Freq: Two times a day (BID) | RESPIRATORY_TRACT | 3 refills | Status: DC

## 2016-06-02 NOTE — Patient Instructions (Addendum)
Your Ruthe Mannan was changed to 1 inhaler every 3 months per your request  Please continue all other medications as before, and refills have been done if requested.  Please have the pharmacy call with any other refills you may need.  Please continue your efforts at being more active, low cholesterol diet, and weight control.  You are otherwise up to date with prevention measures today.  Please keep your appointments with your specialists as you may have planned  Please return in 6 months, or sooner if needed, with Lab testing done 3-5 days before

## 2016-06-02 NOTE — Telephone Encounter (Signed)
Pharmacy called and wanted to let you know they faxed over a PA for a medication and wanted you to be on the look out for it. Thanks.

## 2016-06-02 NOTE — Telephone Encounter (Signed)
Completed PA on cover my meds. Received [place] (Key: [id number]).  [employer] is reviewing your PA request. Typically an electronic response will be received within 72 hours..../LMB

## 2016-06-02 NOTE — Assessment & Plan Note (Signed)

## 2016-06-02 NOTE — Assessment & Plan Note (Signed)
stable overall by history and exam, recent data reviewed with pt, and pt to continue medical treatment as before,  to f/u any worsening symptoms or concerns , to cont diet, does not need OHA

## 2016-06-02 NOTE — Telephone Encounter (Signed)
rec'd call from Kelly/pharmacist w/[facility] pt needing a PA on the [facility]. Insurance # [id number]. Member ID# [id number]...Johny Chess

## 2016-06-02 NOTE — Progress Notes (Signed)
Subjective:    Patient ID: Sean Lozano, male    DOB: January 30, 1934, 83 y.o.   MRN: [id number]  HPI    Here for wellness and f/u;  Overall doing ok;  Pt denies Chest pain, worsening SOB, DOE, wheezing, orthopnea, PND, worsening LE edema, palpitations, dizziness or syncope.  Pt denies neurological change such as new headache, facial or extremity weakness.  Pt denies polydipsia, polyuria, or low sugar symptoms. Pt states overall good compliance with treatment and medications, good tolerability, and has been trying to follow appropriate diet.  Pt denies worsening depressive symptoms, suicidal ideation or panic. No fever, night sweats, wt loss, loss of appetite, or other constitutional symptoms.  Pt states good ability with ADL's, has low fall risk, home safety reviewed and adequate, no other significant changes in hearing or vision, and only occasionally active with exercise, as has recurring left LBP that he is no longer surgury candidate due to age. Has some pain to Left leg and knee as well, sometimes needs cane to ambulate such as yesterday, but not today.  Has optho appt for f/u sept 2018, may need bilat cataract surgury.   Has more stress recently due to wife memory and behavior issues. BP Readings from Last 3 Encounters:  06/02/16 128/82  02/16/16 126/80  11/27/15 132/70   Wt Readings from Last 3 Encounters:  06/02/16 215 lb (97.5 kg)  02/16/16 216 lb (98 kg)  11/27/15 216 lb (98 kg)   Past Medical History:  Diagnosis Date  . 1st degree AV block   . Arthritis   . Asthma   . BARRETTS ESOPHAGUS 06/02/2007  . Benign prostatic hypertrophy   . Bladder neck obstruction 09/29/2010  . CAD (coronary artery disease)    a. CABG x 6 in 1992. b. s/p DES x 2 to SVG -> OM1/OM2 in 09/2010. c. s/p DES to mid LAD 10/2010. d. NSTEMI (off DAPT) s/p PTCA of instent renarrowing of DES in SVG-OM, Recs for lifelong DAPT   . Carotid artery disease ([place])    Doppler, May, 2014, [id number] R. ICA, [id number] LICA, with  recent syncope patient sent for consult with vascular surgery  . Carotid bruit    April, 2014  . Cervical disc disease   . Concussion July 09, 2012   Scalp  . Depression   . Diabetes mellitus    type II-diet  . Diverticulosis   . Ejection fraction    EF 60%, echo, June, 2012  . Fall at home July 09, 2012  . GERD (gastroesophageal reflux disease)   . Hearing loss 2014  . Hx of CABG    1992  . Hyperlipidemia    intolerance to Crestor Zocor Vytorin. He tolerates Pravachol...low HDL  . Paresthesias    successful surgery by Dr. Earnie Larsson  . Prostatitis   . Rosacea   . Sinus bradycardia    asymptomatic   . SKIN LESION 06/02/2007  . Subdural hematoma ([facility])    Subdural hematoma secondary to falling off a ladder  April, 2014,  medical therapy,  subdural did not expand while blood levels of ddual antiplatelet therapy decreased over one week.  Episode of chest pain in the hospital,, dual antiplatelet therapy restarted approximately one week after it was held  . Syncope    The patient lost consciousness and hit his head July 09 2012, not clear yet if he had true syncope   Past Surgical History:  Procedure Laterality Date  . APPENDECTOMY    . Barretts  Esophagus   20/20/09  . Bladder neck obstruction  09/29/10  . C-spine disc surgery  03/2006  . C4-5 surgery  01/2009  . CARDIAC CATHETERIZATION  July 2012   stent placed  X's  4  . COCCYX FRACTURE SURGERY  1997  . CORONARY ARTERY BYPASS GRAFT  1992  . ENDARTERECTOMY Left 09/07/2012   Procedure: ENDARTERECTOMY CAROTID with patch angioplasty;  Surgeon: Mal Misty, MD;  Location: [facility];  Service: Vascular;  Laterality: Left;  . LEFT HEART CATHETERIZATION WITH CORONARY/GRAFT ANGIOGRAM N/A 12/21/2011   Procedure: LEFT HEART CATHETERIZATION WITH Beatrix Fetters;  Surgeon: Hillary Bow, MD;  Location: [facility] CATH LAB;  Service: Cardiovascular;  Laterality: N/A;  . TONSILLECTOMY      reports that he has quit smoking. His smoking  use included Cigarettes. He started smoking about 61 years ago. He has never used smokeless tobacco. He reports that he drinks alcohol. He reports that he does not use drugs. family history includes Diabetes in his mother and sister; Heart attack in his father; Heart disease in his father and mother; Hyperlipidemia in his son; Hypertension in his son and son; Kidney disease in his sister. Allergies  Allergen Reactions  . Atenolol Other (See Comments)    depression  . Rosuvastatin Other (See Comments)    Severe hip pain  . Simvastatin Other (See Comments)    Severe hip pain  . Ultram [Tramadol] Nausea And Vomiting  . Zofran [Ondansetron Hcl] Nausea Only and Other (See Comments)    Severe nausea and constipation  . Adhesive [Tape] Other (See Comments)    Breakdown skin  . Codeine Other (See Comments)    Crazy feeling  . Ezetimibe-Simvastatin Other (See Comments)    Severe hip pain   Current Outpatient Prescriptions on File Prior to Visit  Medication Sig Dispense Refill  . aspirin (ASPIRIN EC) 81 MG EC tablet Take 81 mg by mouth daily.      . Cholecalciferol (VITAMIN D3) 2000 UNITS TABS Take 1 tablet by mouth daily.     . clopidogrel (PLAVIX) 75 MG tablet TAKE 1 TABLET (75 MG) BY MOUTH DAILY. 90 tablet 3  . diphenhydrAMINE (BENADRYL) 25 MG tablet Take 25 mg by mouth daily as needed for allergies.     Marland Kitchen FLUZONE HIGH-DOSE 0.5 ML SUSY Inject as directed once.  0  . metoprolol succinate (TOPROL-XL) 25 MG 24 hr tablet Take 0.5 tablets (12.5 mg total) by mouth 2 (two) times daily. 90 tablet 3  . montelukast (SINGULAIR) 10 MG tablet Take 1 tablet (10 mg total) by mouth at bedtime. Yearly physical w/labs due in February must see MD for refills 30 tablet 0  . niacin (NIASPAN) 1000 MG CR tablet TAKE 1 TABLET BY MOUTH AT BEDTIME. TAKE WITH ASPIRIN 90 tablet 2  . nitroGLYCERIN (NITROLINGUAL) 0.4 MG/SPRAY spray Place 1 spray under the tongue every 5 (five) minutes as needed for chest pain. 12 g 0  .  nitroGLYCERIN (NITROSTAT) 0.4 MG SL tablet Place 1 tablet (0.4 mg total) under the tongue every 5 (five) minutes as needed for chest pain. 25 tablet 1  . pantoprazole (PROTONIX) 40 MG tablet Take 1 tablet (40 mg total) by mouth daily. 90 tablet 3  . pravastatin (PRAVACHOL) 40 MG tablet Take 1 tablet (40 mg total) by mouth every evening. 90 tablet 3  . tamsulosin (FLOMAX) 0.4 MG CAPS Take 1 capsule (0.4 mg total) by mouth daily after supper. 90 capsule 3   No current facility-administered medications  on file prior to visit.    Review of Systems Constitutional: Negative for increased diaphoresis, or other activity, appetite or siginficant weight change other than noted HENT: Negative for worsening hearing loss, ear pain, facial swelling, mouth sores and neck stiffness.   Eyes: Negative for other worsening pain, redness or visual disturbance.  Respiratory: Negative for choking or stridor Cardiovascular: Negative for other chest pain and palpitations.  Gastrointestinal: Negative for worsening diarrhea, blood in stool, or abdominal distention Genitourinary: Negative for hematuria, flank pain or change in urine volume.  Musculoskeletal: Negative for myalgias or other joint complaints.  Skin: Negative for other color change and wound or drainage.  Neurological: Negative for syncope and numbness. other than noted Hematological: Negative for adenopathy. or other swelling Psychiatric/Behavioral: Negative for hallucinations, SI, self-injury, decreased concentration or other worsening agitation.  All other system neg per pt    Objective:   Physical Exam BP 128/82   Pulse 73   Temp 97.6 F (36.4 C)   Ht 5\' 11"  (1.803 m)   Wt 215 lb (97.5 kg)   SpO2 93%   BMI 29.99 kg/m  VS noted,  Constitutional: Pt is oriented to person, place, and time. Appears well-developed and well-nourished, in no significant distress Head: Normocephalic and atraumatic  Eyes: Conjunctivae and EOM are normal. Pupils are  equal, round, and reactive to light Right Ear: External ear normal.  Left Ear: External ear normal Nose: Nose normal.  Mouth/Throat: Oropharynx is clear and moist  Neck: Normal range of motion. Neck supple. No JVD present. No tracheal deviation present or significant neck LA or mass Cardiovascular: Normal rate, regular rhythm, normal heart sounds and intact distal pulses.   Pulmonary/Chest: Effort normal and breath sounds without rales or wheezing  Abdominal: Soft. Bowel sounds are normal. NT. No HSM  Musculoskeletal: Normal range of motion. Exhibits no edema Lymphadenopathy: Has no cervical adenopathy.  Neurological: Pt is alert and oriented to person, place, and time. Pt has normal reflexes. No cranial nerve deficit. Motor grossly intact, does have some evidence for slow memory recall Skin: Skin is warm and dry. No rash noted or new ulcers Psychiatric:  Has normal mood and affect. Behavior is normal.  No other new exam findings    Assessment & Plan:

## 2016-06-03 ENCOUNTER — Telehealth: Payer: Self-pay | Admitting: Emergency Medicine

## 2016-06-03 NOTE — Telephone Encounter (Signed)
PA for Ruthe Mannan has been approved.

## 2016-06-04 NOTE — Telephone Encounter (Signed)
PA was approved. Noted in another Telephone note as well.

## 2016-08-03 ENCOUNTER — Other Ambulatory Visit: Payer: Self-pay | Admitting: Internal Medicine

## 2016-10-11 ENCOUNTER — Encounter: Payer: Self-pay | Admitting: Family

## 2016-10-18 ENCOUNTER — Other Ambulatory Visit: Payer: Self-pay

## 2016-10-18 DIAGNOSIS — I779 Disorder of arteries and arterioles, unspecified: Secondary | ICD-10-CM

## 2016-10-18 DIAGNOSIS — I739 Peripheral vascular disease, unspecified: Principal | ICD-10-CM

## 2016-10-19 ENCOUNTER — Encounter: Payer: Self-pay | Admitting: Family

## 2016-10-19 ENCOUNTER — Ambulatory Visit (INDEPENDENT_AMBULATORY_CARE_PROVIDER_SITE_OTHER): Payer: Medicare Other | Admitting: Family

## 2016-10-19 ENCOUNTER — Ambulatory Visit (HOSPITAL_COMMUNITY)
Admission: RE | Admit: 2016-10-19 | Discharge: 2016-10-19 | Disposition: A | Discharge status: Home / Self Care | Payer: Medicare Other | Source: Ambulatory Visit | Attending: Vascular Surgery | Admitting: Vascular Surgery

## 2016-10-19 VITALS — BP 169/90 | HR 60 | Temp 96.8°F | Resp 18 | Ht 71.0 in | Wt 207.0 lb

## 2016-10-19 DIAGNOSIS — I6522 Occlusion and stenosis of left carotid artery: Secondary | ICD-10-CM

## 2016-10-19 DIAGNOSIS — I739 Peripheral vascular disease, unspecified: Secondary | ICD-10-CM

## 2016-10-19 DIAGNOSIS — R0989 Other specified symptoms and signs involving the circulatory and respiratory systems: Secondary | ICD-10-CM | POA: Diagnosis not present

## 2016-10-19 DIAGNOSIS — I779 Disorder of arteries and arterioles, unspecified: Secondary | ICD-10-CM | POA: Insufficient documentation

## 2016-10-19 DIAGNOSIS — Z9889 Other specified postprocedural states: Secondary | ICD-10-CM

## 2016-10-19 NOTE — Patient Instructions (Signed)
Stroke Prevention Some medical conditions and behaviors are associated with an increased chance of having a stroke. You may prevent a stroke by making healthy choices and managing medical conditions. How can I reduce my risk of having a stroke?  Stay physically active. Get at least 30 minutes of activity on most or all days.  Do not smoke. It may also be helpful to avoid exposure to secondhand smoke.  Limit alcohol use. Moderate alcohol use is considered to be:  No more than 2 drinks per day for men.  No more than 1 drink per day for nonpregnant women.  Eat healthy foods. This involves:  Eating 5 or more servings of fruits and vegetables a day.  Making dietary changes that address high blood pressure (hypertension), high cholesterol, diabetes, or obesity.  Manage your cholesterol levels.  Making food choices that are high in fiber and low in saturated fat, trans fat, and cholesterol may control cholesterol levels.  Take any prescribed medicines to control cholesterol as directed by your health care provider.  Manage your diabetes.  Controlling your carbohydrate and sugar intake is recommended to manage diabetes.  Take any prescribed medicines to control diabetes as directed by your health care provider.  Control your hypertension.  Making food choices that are low in salt (sodium), saturated fat, trans fat, and cholesterol is recommended to manage hypertension.  Ask your health care provider if you need treatment to lower your blood pressure. Take any prescribed medicines to control hypertension as directed by your health care provider.  If you are 18-39 years of age, have your blood pressure checked every 3-5 years. If you are 40 years of age or older, have your blood pressure checked every year.  Maintain a healthy weight.  Reducing calorie intake and making food choices that are low in sodium, saturated fat, trans fat, and cholesterol are recommended to manage  weight.  Stop drug abuse.  Avoid taking birth control pills.  Talk to your health care provider about the risks of taking birth control pills if you are over 35 years old, smoke, get migraines, or have ever had a blood clot.  Get evaluated for sleep disorders (sleep apnea).  Talk to your health care provider about getting a sleep evaluation if you snore a lot or have excessive sleepiness.  Take medicines only as directed by your health care provider.  For some people, aspirin or blood thinners (anticoagulants) are helpful in reducing the risk of forming abnormal blood clots that can lead to stroke. If you have the irregular heart rhythm of atrial fibrillation, you should be on a blood thinner unless there is a good reason you cannot take them.  Understand all your medicine instructions.  Make sure that other conditions (such as anemia or atherosclerosis) are addressed. Get help right away if:  You have sudden weakness or numbness of the face, arm, or leg, especially on one side of the body.  Your face or eyelid droops to one side.  You have sudden confusion.  You have trouble speaking (aphasia) or understanding.  You have sudden trouble seeing in one or both eyes.  You have sudden trouble walking.  You have dizziness.  You have a loss of balance or coordination.  You have a sudden, severe headache with no known cause.  You have new chest pain or an irregular heartbeat. Any of these symptoms may represent a serious problem that is an emergency. Do not wait to see if the symptoms will go away.   Get medical help at once. Call your local emergency services (911 in [place] Do not drive yourself to the hospital. This information is not intended to replace advice given to you by your health care provider. Make sure you discuss any questions you have with your health care provider. Document Released: 05/06/2004 Document Revised: 09/04/2015 Document Reviewed: 09/29/2012 Elsevier  Interactive Patient Education  2017 [employer]  

## 2016-10-19 NOTE — Progress Notes (Signed)
Chief Complaint: Follow up Extracranial Carotid Artery Stenosis   History of Present Illness  Sean Lozano is a 83 y.o. male who is status post left carotid endarterectomy on 09/07/2012 by Dr. Kellie Simmering. He returns today for routine follow up.  The patient denies any history of TIA or stroke symptoms, specifically he denies a history of amaurosis fugax or monocular blindness, unilateral facial drooping,  hemiplegia, or receptive or expressive aphasia.   He reports a tight feeling in his lower legs at night which improves with walking. He denies non healing wound(s) in feet or legs.   He has lumbar disc issues with left LE radiculopathy.  He sees Dr. Burt Knack for his CAD, states he has a hx of a mild MI, had 6 vessel CABG in 1992.  Pt Diabetic: yes, A1C was 6.9 on 05-24-16 Pt smoker: non-smoker  Pt meds include: Statin : yes ASA: yes Other anticoagulants/antiplatelets: Plavix   Past Medical History:  Diagnosis Date  . 1st degree AV block   . Arthritis   . Asthma   . BARRETTS ESOPHAGUS 06/02/2007  . Benign prostatic hypertrophy   . Bladder neck obstruction 09/29/2010  . CAD (coronary artery disease)    a. CABG x 6 in 1992. b. s/p DES x 2 to SVG -> OM1/OM2 in 09/2010. c. s/p DES to mid LAD 10/2010. d. NSTEMI (off DAPT) s/p PTCA of instent renarrowing of DES in SVG-OM, Recs for lifelong DAPT   . Carotid artery disease ([place])    Doppler, May, 2014, 4-26% R. ICA, 83-41% LICA, with recent syncope patient sent for consult with vascular surgery  . Carotid bruit    April, 2014  . Cervical disc disease   . Concussion July 09, 2012   Scalp  . Depression   . Diabetes mellitus    type II-diet  . Diverticulosis   . Ejection fraction    EF 60%, echo, June, 2012  . Fall at home July 09, 2012  . GERD (gastroesophageal reflux disease)   . Hearing loss 2014  . Hx of CABG    1992  . Hyperlipidemia    intolerance to Crestor Zocor Vytorin. He tolerates Pravachol...low HDL  .  Paresthesias    successful surgery by Dr. Earnie Larsson  . Prostatitis   . Rosacea   . Sinus bradycardia    asymptomatic   . SKIN LESION 06/02/2007  . Subdural hematoma (HCC)    Subdural hematoma secondary to falling off a ladder  April, 2014,  medical therapy,  subdural did not expand while blood levels of ddual antiplatelet therapy decreased over one week.  Episode of chest pain in the hospital,, dual antiplatelet therapy restarted approximately one week after it was held  . Syncope    The patient lost consciousness and hit his head July 09 2012, not clear yet if he had true syncope    Social History Social History  Substance Use Topics  . Smoking status: Former Smoker    Types: Cigarettes    Start date: 01/03/1955  . Smokeless tobacco: Never Used  . Alcohol use Yes     Comment: occ    Family History Family History  Problem Relation Age of Onset  . Heart disease Father        mother  . Heart attack Father   . Diabetes Sister        mother  . Kidney disease Sister   . Hypertension Son   . Hyperlipidemia Son   . Hypertension Son   .  Diabetes Mother   . Heart disease Mother   . Aneurysm Unknown   . Hyperlipidemia Unknown   . Liver cancer Unknown        grandfather    Surgical History Past Surgical History:  Procedure Laterality Date  . APPENDECTOMY    . Barretts Esophagus   20/20/09  . Bladder neck obstruction  09/29/10  . C-spine disc surgery  03/2006  . C4-5 surgery  01/2009  . CARDIAC CATHETERIZATION  July 2012   stent placed  X's  4  . COCCYX FRACTURE SURGERY  1997  . CORONARY ARTERY BYPASS GRAFT  1992  . ENDARTERECTOMY Left 09/07/2012   Procedure: ENDARTERECTOMY CAROTID with patch angioplasty;  Surgeon: Mal Misty, MD;  Location: [facility];  Service: Vascular;  Laterality: Left;  . LEFT HEART CATHETERIZATION WITH CORONARY/GRAFT ANGIOGRAM N/A 12/21/2011   Procedure: LEFT HEART CATHETERIZATION WITH Beatrix Fetters;  Surgeon: Hillary Bow, MD;   Location: [facility] CATH LAB;  Service: Cardiovascular;  Laterality: N/A;  . TONSILLECTOMY      Allergies  Allergen Reactions  . Atenolol Other (See Comments)    depression  . Rosuvastatin Other (See Comments)    Severe hip pain  . Simvastatin Other (See Comments)    Severe hip pain  . Ultram [Tramadol] Nausea And Vomiting  . Zofran [Ondansetron Hcl] Nausea Only and Other (See Comments)    Severe nausea and constipation  . Adhesive [Tape] Other (See Comments)    Breakdown skin  . Codeine Other (See Comments)    Crazy feeling  . Ezetimibe-Simvastatin Other (See Comments)    Severe hip pain    Current Outpatient Prescriptions  Medication Sig Dispense Refill  . aspirin (ASPIRIN EC) 81 MG EC tablet Take 81 mg by mouth daily.      . Cholecalciferol (VITAMIN D3) 5000 units CAPS Take 1 tablet by mouth daily.     . clopidogrel (PLAVIX) 75 MG tablet TAKE 1 TABLET (75 MG) BY MOUTH DAILY. 90 tablet 3  . diphenhydrAMINE (BENADRYL) 25 MG tablet Take 25 mg by mouth daily as needed for allergies.     Marland Kitchen FLUZONE HIGH-DOSE 0.5 ML SUSY Inject as directed once.  0  . metoprolol succinate (TOPROL-XL) 25 MG 24 hr tablet Take 0.5 tablets (12.5 mg total) by mouth 2 (two) times daily. 90 tablet 3  . mometasone-formoterol (DULERA) 100-5 MCG/ACT AERO Inhale 2 puffs into the lungs 2 (two) times daily. Prn, to last 3 months 1 Inhaler 3  . montelukast (SINGULAIR) 10 MG tablet Take 1 tablet (10 mg total) by mouth at bedtime. Yearly physical w/labs due in February must see MD for refills 30 tablet 0  . montelukast (SINGULAIR) 10 MG tablet Take 1 tablet (10 mg total) by mouth daily. 90 tablet 2  . niacin (NIASPAN) 1000 MG CR tablet TAKE 1 TABLET BY MOUTH AT BEDTIME. TAKE WITH ASPIRIN 90 tablet 2  . nitroGLYCERIN (NITROLINGUAL) 0.4 MG/SPRAY spray Place 1 spray under the tongue every 5 (five) minutes as needed for chest pain. 12 g 0  . nitroGLYCERIN (NITROSTAT) 0.4 MG SL tablet Place 1 tablet (0.4 mg total) under the  tongue every 5 (five) minutes as needed for chest pain. 25 tablet 1  . pantoprazole (PROTONIX) 40 MG tablet Take 1 tablet (40 mg total) by mouth daily. 90 tablet 3  . pravastatin (PRAVACHOL) 40 MG tablet Take 1 tablet (40 mg total) by mouth every evening. 90 tablet 3  . tamsulosin (FLOMAX) 0.4 MG CAPS Take 1  capsule (0.4 mg total) by mouth daily after supper. 90 capsule 3   No current facility-administered medications for this visit.     Review of Systems : See HPI for pertinent positives and negatives.  Physical Examination  Vitals:   10/19/16 1011 10/19/16 1014  BP: (!) 167/93 (!) 169/90  Pulse: 60   Resp: 18   Temp: (!) 96.8 F (36 C)   TempSrc: Oral   SpO2: 95%   Weight: 207 lb (93.9 kg)   Height: 5\' 11"  (1.803 m)    Body mass index is 28.87 kg/m.  General: WDWN obese male in NAD GAIT: normal Eyes: PERRLA Pulmonary: Respirations are non-labored, CTAB Cardiac: regular rhythm and rate, no detected murmur.  VASCULAR EXAM Carotid Bruits Right Left   Negative Negative   Aorta is not palpable. Radial pulses are 2+ palpable and equal.      LE Pulses Right Left   POPLITEAL not palpable  not palpable   POSTERIOR TIBIAL not palpable  not palpable    DORSALIS PEDIS  ANTERIOR TIBIAL not palpable  not palpable     Gastrointestinal: soft, nontender, BS WNL, no r/g, no palpable masses.  Musculoskeletal: No muscle atrophy/wasting. M/S 5/5 throughout, extremities without ischemic changes.  Neurologic: A&O X 3, appropriate affect, speech is normal, CN 2-12 intact, pain and light touch intact in extremities, motor exam as listed above   Assessment: Sean Lozano is a 83 y.o. male who is status post left carotid endarterectomy on 09/07/2012. He has no history of stroke or TIA.  His atherosclerotic  risk factors include DM (currently in control) and CAD.  Fortunately he has never used tobacco.  He takes a daily statin, ASA 81 mg, and Plavix.   Not able to palpate pedal pulses today, were 1+ palpable a year ago. No signs of ischemia in feet or legs, no claudication symptoms with walking, will check ABI's on his return.    DATA Carotid Duplex (10/19/16): 1-39% stenosis of the right internal carotid artery and left ICA with no restenosis since the CEA. Bilateral vertebral artery flow is antegrade.  Bilateral subclavian artery waveforms are normal.  No significant change compared to exam of 10/17/14 and 10/20/15.   Plan:  Graduated walking program and daily seated leg exercises discussed and how to achieve.   Follow-up in 18 months with Carotid Duplex scan and ABI's.   I discussed in depth with the patient the nature of atherosclerosis, and emphasized the importance of maximal medical management including strict control of blood pressure, blood glucose, and lipid levels, obtaining regular exercise, and continued cessation of smoking.  The patient is aware that without maximal medical management the underlying atherosclerotic disease process will progress, limiting the benefit of any interventions. The patient was given information about stroke prevention and what symptoms should prompt the patient to seek immediate medical care. Thank you for allowing [place] to participate in this patient's care.  Clemon Chambers, RN, MSN, FNP-C [facility] Office: [phone]  Clinic Physician: Early  10/19/16 10:29 AM

## 2016-10-20 NOTE — Addendum Note (Signed)
Addended by: Lianne Cure A on: 10/20/2016 01:35 PM   Modules accepted: Orders

## 2016-11-24 ENCOUNTER — Other Ambulatory Visit (INDEPENDENT_AMBULATORY_CARE_PROVIDER_SITE_OTHER): Payer: Medicare Other

## 2016-11-24 DIAGNOSIS — E119 Type 2 diabetes mellitus without complications: Secondary | ICD-10-CM | POA: Diagnosis not present

## 2016-11-24 LAB — LIPID PANEL
Cholesterol: 118 mg/dL (ref 0–200)
HDL: 34.7 mg/dL — ABNORMAL LOW (ref >39.00)
LDL Cholesterol: 56 mg/dL (ref 0–99)
NonHDL: 83.12
Total CHOL/HDL Ratio: 3
Triglycerides: 136 mg/dL (ref 0.0–149.0)
VLDL: 27.2 mg/dL (ref 0.0–40.0)

## 2016-11-24 LAB — BASIC METABOLIC PANEL
BUN: 15 mg/dL (ref 6–23)
CO2: 28 mEq/L (ref 19–32)
Calcium: 9.7 mg/dL (ref 8.4–10.5)
Chloride: 102 mEq/L (ref 96–112)
Creatinine, Ser: 1.08 mg/dL (ref 0.40–1.50)
GFR: 69.65 mL/min (ref >60.00)
Glucose, Bld: 111 mg/dL — ABNORMAL HIGH (ref 70–99)
Potassium: 4.3 mEq/L (ref 3.5–5.1)
Sodium: 138 mEq/L (ref 135–145)

## 2016-11-24 LAB — HEPATIC FUNCTION PANEL
ALT: 15 U/L (ref 0–53)
AST: 19 U/L (ref 0–37)
Albumin: 4.2 g/dL (ref 3.5–5.2)
Alkaline Phosphatase: 55 U/L (ref 39–117)
Bilirubin, Direct: 0.2 mg/dL (ref 0.0–0.3)
Total Bilirubin: 0.8 mg/dL (ref 0.2–1.2)
Total Protein: 6.7 g/dL (ref 6.0–8.3)

## 2016-11-24 LAB — HEMOGLOBIN A1C: Hgb A1c MFr Bld: 6.5 % (ref 4.6–6.5)

## 2016-12-01 ENCOUNTER — Encounter: Payer: Self-pay | Admitting: Internal Medicine

## 2016-12-01 ENCOUNTER — Ambulatory Visit (INDEPENDENT_AMBULATORY_CARE_PROVIDER_SITE_OTHER): Payer: Medicare Other | Admitting: Internal Medicine

## 2016-12-01 VITALS — BP 120/78 | HR 58 | Temp 97.6°F | Ht 71.0 in | Wt 206.0 lb

## 2016-12-01 DIAGNOSIS — E785 Hyperlipidemia, unspecified: Secondary | ICD-10-CM

## 2016-12-01 DIAGNOSIS — F329 Major depressive disorder, single episode, unspecified: Secondary | ICD-10-CM

## 2016-12-01 DIAGNOSIS — Z0000 Encounter for general adult medical examination without abnormal findings: Secondary | ICD-10-CM | POA: Diagnosis not present

## 2016-12-01 DIAGNOSIS — M79604 Pain in right leg: Secondary | ICD-10-CM

## 2016-12-01 DIAGNOSIS — M79605 Pain in left leg: Secondary | ICD-10-CM

## 2016-12-01 DIAGNOSIS — F32A Depression, unspecified: Secondary | ICD-10-CM

## 2016-12-01 DIAGNOSIS — E119 Type 2 diabetes mellitus without complications: Secondary | ICD-10-CM

## 2016-12-01 MED ORDER — AMITRIPTYLINE HCL 50 MG PO TABS: 50.0000 mg | ORAL_TABLET | Freq: Every evening | ORAL | 1 refills | Status: DC | PRN

## 2016-12-01 NOTE — Assessment & Plan Note (Signed)
Likely neuritic pain qhs  - for elavil qhs prn,  to f/u any worsening symptoms or concerns

## 2016-12-01 NOTE — Assessment & Plan Note (Signed)
stable overall by history and exam though incresed stressors recently, and pt to continue medical treatment as before,  to f/u any worsening symptoms or concerns

## 2016-12-01 NOTE — Patient Instructions (Signed)
Please take all new medication as prescribed - the elavil for leg pain and sleep  Please continue all other medications as before, and refills have been done if requested.  Please have the pharmacy call with any other refills you may need.  Please continue your efforts at being more active, low cholesterol diet, and weight control.  Please keep your appointments with your specialists as you may have planned  Please return in 6 months, or sooner if needed, with Lab testing done 3-5 days before

## 2016-12-01 NOTE — Assessment & Plan Note (Signed)
Lab Results  Component Value Date   HGBA1C 6.5 11/24/2016  stable overall by history and exam, recent data reviewed with pt, and pt to continue medical treatment as before,  to f/u any worsening symptoms or concerns

## 2016-12-01 NOTE — Assessment & Plan Note (Signed)
stable overall by history and exam, recent data reviewed with pt, and pt to continue medical treatment as before,  to f/u any worsening symptoms or concerns Lab Results  Component Value Date   LDLCALC 56 11/24/2016

## 2016-12-01 NOTE — Progress Notes (Signed)
Subjective:    Patient ID: Sean Lozano, male    DOB: [birth year], 83 y.o.   MRN: [id number]  HPI  Here to f/u; overall doing ok,  Pt denies chest pain, increasing sob or doe, wheezing, orthopnea, PND, increased LE swelling, palpitations, dizziness or syncope.  Pt denies new neurological symptoms such as new headache, or facial or extremity weakness or numbness.  Pt denies polydipsia, polyuria, or low sugar episode.  Pt states overall good compliance with meds, mostly trying to follow appropriate diet, with wt overall down 10 lbs due to increased actvity taking care of ill wife after ankle fracture at 79 Wt Readings from Last 3 Encounters:  12/01/16 206 lb (93.4 kg)  10/19/16 207 lb (93.9 kg)  06/02/16 215 lb (97.5 kg)  Denies worsening depressive symptoms, suicidal ideation, or panic; has ongoing anxiety, and actually was arrested then charges dismissed for battery on wife after he "popped" her to get her attention the first night she went to rehab and because agitated.  Also c/o bilat leg nerve pain trying to go to sleep at night, hard to get to sleep. Past Medical History:  Diagnosis Date  . 1st degree AV block   . Arthritis   . Asthma   . BARRETTS ESOPHAGUS 06/02/2007  . Benign prostatic hypertrophy   . Bladder neck obstruction 09/29/2010  . CAD (coronary artery disease)    a. CABG x 6 in 1992. b. s/p DES x 2 to SVG -> OM1/OM2 in 09/2010. c. s/p DES to mid LAD 10/2010. d. NSTEMI (off DAPT) s/p PTCA of instent renarrowing of DES in SVG-OM, Recs for lifelong DAPT   . Carotid artery disease ([place])    Doppler, May, 2014, 2-63% R. ICA, 78-58% LICA, with recent syncope patient sent for consult with vascular surgery  . Carotid bruit    April, 2014  . Cervical disc disease   . Concussion July 09, 2012   Scalp  . Depression   . Diabetes mellitus    type II-diet  . Diverticulosis   . Ejection fraction    EF 60%, echo, June, 2012  . Fall at home July 09, 2012  . GERD (gastroesophageal  reflux disease)   . Hearing loss 2014  . Hx of CABG    1992  . Hyperlipidemia    intolerance to Crestor Zocor Vytorin. He tolerates Pravachol...low HDL  . Paresthesias    successful surgery by Dr. Earnie Larsson  . Prostatitis   . Rosacea   . Sinus bradycardia    asymptomatic   . SKIN LESION 06/02/2007  . Subdural hematoma (HCC)    Subdural hematoma secondary to falling off a ladder  April, 2014,  medical therapy,  subdural did not expand while blood levels of ddual antiplatelet therapy decreased over one week.  Episode of chest pain in the hospital,, dual antiplatelet therapy restarted approximately one week after it was held  . Syncope    The patient lost consciousness and hit his head July 09 2012, not clear yet if he had true syncope   Past Surgical History:  Procedure Laterality Date  . APPENDECTOMY    . Barretts Esophagus   20/20/09  . Bladder neck obstruction  09/29/10  . C-spine disc surgery  03/2006  . C4-5 surgery  01/2009  . CARDIAC CATHETERIZATION  July 2012   stent placed  X's  4  . COCCYX FRACTURE SURGERY  1997  . CORONARY ARTERY BYPASS GRAFT  1992  . ENDARTERECTOMY Left 09/07/2012  Procedure: ENDARTERECTOMY CAROTID with patch angioplasty;  Surgeon: Mal Misty, MD;  Location: [place];  Service: Vascular;  Laterality: Left;  . LEFT HEART CATHETERIZATION WITH CORONARY/GRAFT ANGIOGRAM N/A 12/21/2011   Procedure: LEFT HEART CATHETERIZATION WITH Beatrix Fetters;  Surgeon: Hillary Bow, MD;  Location: [facility] CATH LAB;  Service: Cardiovascular;  Laterality: N/A;  . TONSILLECTOMY      reports that he has quit smoking. His smoking use included Cigarettes. He started smoking about 61 years ago. He has never used smokeless tobacco. He reports that he drinks alcohol. He reports that he does not use drugs. family history includes Aneurysm in his unknown relative; Diabetes in his mother and sister; Heart attack in his father; Heart disease in his father and mother;  Hyperlipidemia in his son and unknown relative; Hypertension in his son and son; Kidney disease in his sister; Liver cancer in his unknown relative. Allergies  Allergen Reactions  . Atenolol Other (See Comments)    depression  . Rosuvastatin Other (See Comments)    Severe hip pain  . Simvastatin Other (See Comments)    Severe hip pain  . Ultram [Tramadol] Nausea And Vomiting  . Zofran [Ondansetron Hcl] Nausea Only and Other (See Comments)    Severe nausea and constipation  . Adhesive [Tape] Other (See Comments)    Breakdown skin  . Codeine Other (See Comments)    Crazy feeling  . Ezetimibe-Simvastatin Other (See Comments)    Severe hip pain   Current Outpatient Prescriptions on File Prior to Visit  Medication Sig Dispense Refill  . aspirin (ASPIRIN EC) 81 MG EC tablet Take 81 mg by mouth daily.      . Cholecalciferol (VITAMIN D3) 5000 units CAPS Take 1 tablet by mouth daily.     . clopidogrel (PLAVIX) 75 MG tablet TAKE 1 TABLET (75 MG) BY MOUTH DAILY. 90 tablet 3  . diphenhydrAMINE (BENADRYL) 25 MG tablet Take 25 mg by mouth daily as needed for allergies.     Marland Kitchen FLUZONE HIGH-DOSE 0.5 ML SUSY Inject as directed once.  0  . metoprolol succinate (TOPROL-XL) 25 MG 24 hr tablet Take 0.5 tablets (12.5 mg total) by mouth 2 (two) times daily. 90 tablet 3  . mometasone-formoterol (DULERA) 100-5 MCG/ACT AERO Inhale 2 puffs into the lungs 2 (two) times daily. Prn, to last 3 months 1 Inhaler 3  . montelukast (SINGULAIR) 10 MG tablet Take 1 tablet (10 mg total) by mouth at bedtime. Yearly physical w/labs due in February must see MD for refills 30 tablet 0  . montelukast (SINGULAIR) 10 MG tablet Take 1 tablet (10 mg total) by mouth daily. 90 tablet 2  . niacin (NIASPAN) 1000 MG CR tablet TAKE 1 TABLET BY MOUTH AT BEDTIME. TAKE WITH ASPIRIN 90 tablet 2  . nitroGLYCERIN (NITROLINGUAL) 0.4 MG/SPRAY spray Place 1 spray under the tongue every 5 (five) minutes as needed for chest pain. 12 g 0  .  nitroGLYCERIN (NITROSTAT) 0.4 MG SL tablet Place 1 tablet (0.4 mg total) under the tongue every 5 (five) minutes as needed for chest pain. 25 tablet 1  . pantoprazole (PROTONIX) 40 MG tablet Take 1 tablet (40 mg total) by mouth daily. 90 tablet 3  . pravastatin (PRAVACHOL) 40 MG tablet Take 1 tablet (40 mg total) by mouth every evening. 90 tablet 3  . tamsulosin (FLOMAX) 0.4 MG CAPS Take 1 capsule (0.4 mg total) by mouth daily after supper. 90 capsule 3   No current facility-administered medications on file prior  to visit.    Review of Systems  Constitutional: Negative for other unusual diaphoresis or sweats HENT: Negative for ear discharge or swelling Eyes: Negative for other worsening visual disturbances Respiratory: Negative for stridor or other swelling  Gastrointestinal: Negative for worsening distension or other blood Genitourinary: Negative for retention or other urinary change Musculoskeletal: Negative for other MSK pain or swelling Skin: Negative for color change or other new lesions Neurological: Negative for worsening tremors and other numbness  Psychiatric/Behavioral: Negative for worsening agitation or other fatigue All other system neg per pt    Objective:   Physical Exam BP 120/78   Pulse (!) 58   Temp 97.6 F (36.4 C)   Ht 5\' 11"  (1.803 m)   Wt 206 lb (93.4 kg)   SpO2 96%   BMI 28.73 kg/m  VS noted,  Constitutional: Pt appears in NAD HENT: Head: NCAT.  Right Ear: External ear normal.  Left Ear: External ear normal.  Eyes: . Pupils are equal, round, and reactive to light. Conjunctivae and EOM are normal Nose: without d/c or deformity Neck: Neck supple. Gross normal ROM Cardiovascular: Normal rate and regular rhythm.   Pulmonary/Chest: Effort normal and breath sounds without rales or wheezing.  Abd:  Soft, NT, ND, + BS, no organomegaly Neurological: Pt is alert. At baseline orientation, motor grossly intact Skin: Skin is warm. No rashes, other new lesions, no  LE edema Psychiatric: Pt behavior is normal without agitation  No other exam findings Lab Results  Component Value Date   WBC 10.4 05/24/2016   HGB 16.6 05/24/2016   HCT 48.5 05/24/2016   PLT 186.0 05/24/2016   GLUCOSE 111 (H) 11/24/2016   CHOL 118 11/24/2016   TRIG 136.0 11/24/2016   HDL 34.70 (L) 11/24/2016   LDLDIRECT 87.6 09/19/2008   LDLCALC 56 11/24/2016   ALT 15 11/24/2016   AST 19 11/24/2016   NA 138 11/24/2016   K 4.3 11/24/2016   CL 102 11/24/2016   CREATININE 1.08 11/24/2016   BUN 15 11/24/2016   CO2 28 11/24/2016   TSH 3.92 05/24/2016   PSA 1.47 11/20/2013   INR 1.01 09/05/2012   HGBA1C 6.5 11/24/2016   MICROALBUR 0.7 05/24/2016          Assessment & Plan:

## 2016-12-07 ENCOUNTER — Telehealth: Payer: Self-pay

## 2016-12-07 NOTE — Telephone Encounter (Signed)
Key: [facility] Approved through 04/11/2017

## 2016-12-29 LAB — HM DIABETES EYE EXAM

## 2017-01-31 ENCOUNTER — Other Ambulatory Visit: Payer: Self-pay | Admitting: Internal Medicine

## 2017-01-31 ENCOUNTER — Other Ambulatory Visit: Payer: Self-pay | Admitting: Cardiovascular Disease

## 2017-02-19 ENCOUNTER — Other Ambulatory Visit: Payer: Self-pay | Admitting: Cardiovascular Disease

## 2017-02-26 ENCOUNTER — Other Ambulatory Visit: Payer: Self-pay | Admitting: Cardiovascular Disease

## 2017-03-02 ENCOUNTER — Other Ambulatory Visit: Payer: Self-pay | Admitting: Cardiovascular Disease

## 2017-04-25 ENCOUNTER — Ambulatory Visit: Payer: Medicare Other | Admitting: Cardiovascular Disease

## 2017-04-25 ENCOUNTER — Encounter: Payer: Self-pay | Admitting: Cardiovascular Disease

## 2017-04-25 VITALS — BP 100/60 | HR 80 | Ht 71.0 in | Wt 198.8 lb

## 2017-04-25 DIAGNOSIS — I10 Essential (primary) hypertension: Secondary | ICD-10-CM | POA: Diagnosis not present

## 2017-04-25 DIAGNOSIS — I2510 Atherosclerotic heart disease of native coronary artery without angina pectoris: Secondary | ICD-10-CM | POA: Diagnosis not present

## 2017-04-25 DIAGNOSIS — E785 Hyperlipidemia, unspecified: Secondary | ICD-10-CM | POA: Diagnosis not present

## 2017-04-25 MED ORDER — NITROGLYCERIN 0.4 MG SL SUBL: 0.4000 mg | SUBLINGUAL_TABLET | SUBLINGUAL | 1 refills | Status: DC | PRN

## 2017-04-25 NOTE — Patient Instructions (Signed)

## 2017-04-25 NOTE — Progress Notes (Signed)
Cardiology Office Note Date:  04/27/2017   ID:  Sean Lozano, DOB 01/11/1934, MRN [id number]  PCP:  Biagio Borg, MD  Cardiologist:  Sherren Mocha, MD    Chief Complaint  Patient presents with  . Follow-up    CAD     History of Present Illness: Sean Lozano is a 84 y.o. male who presents for follow-up of coronary artery disease.  He underwent CABG in 1992 and has undergone multiple PCI procedures most recently in 2012 and 2013. He is to be maintained on lifelong DAPT. LV function has been preserved. Other problems include Type II DM and hyperlipidemia. He has had a subdural hematoma after falling off of a ladder in 2014. He underwent carotid endarterectomy in 2014.   He is here alone today. Doing well overall. Reports some gait unsteadiness and occasional lightheadedness with positional change. No chest pain, shortness of breath, edema, heart palpitations, orthopnea, or PND. His wife fractured her leg in April, and has developed dementia. He's been caring for her over the past year, putting a lot of stress on her.   Past Medical History:  Diagnosis Date  . 1st degree AV block   . Arthritis   . Asthma   . BARRETTS ESOPHAGUS 06/02/2007  . Benign prostatic hypertrophy   . Bladder neck obstruction 09/29/2010  . CAD (coronary artery disease)    a. CABG x 6 in 1992. b. s/p DES x 2 to SVG -> OM1/OM2 in 09/2010. c. s/p DES to mid LAD 10/2010. d. NSTEMI (off DAPT) s/p PTCA of instent renarrowing of DES in SVG-OM, Recs for lifelong DAPT   . Carotid artery disease ([place])    Doppler, May, 2014, 0-81% R. ICA, 44-81% LICA, with recent syncope patient sent for consult with vascular surgery  . Carotid bruit    April, 2014  . Cervical disc disease   . Concussion July 09, 2012   Scalp  . Depression   . Diabetes mellitus    type II-diet  . Diverticulosis   . Ejection fraction    EF 60%, echo, June, 2012  . Fall at home July 09, 2012  . GERD (gastroesophageal reflux disease)   .  Hearing loss 2014  . Hx of CABG    1992  . Hyperlipidemia    intolerance to Crestor Zocor Vytorin. He tolerates Pravachol...low HDL  . Paresthesias    successful surgery by Dr. Earnie Larsson  . Prostatitis   . Rosacea   . Sinus bradycardia    asymptomatic   . SKIN LESION 06/02/2007  . Subdural hematoma ([facility])    Subdural hematoma secondary to falling off a ladder  April, 2014,  medical therapy,  subdural did not expand while blood levels of ddual antiplatelet therapy decreased over one week.  Episode of chest pain in the hospital,, dual antiplatelet therapy restarted approximately one week after it was held  . Syncope    The patient lost consciousness and hit his head July 09 2012, not clear yet if he had true syncope    Past Surgical History:  Procedure Laterality Date  . APPENDECTOMY    . Barretts Esophagus   20/20/09  . Bladder neck obstruction  09/29/10  . C-spine disc surgery  03/2006  . C4-5 surgery  01/2009  . CARDIAC CATHETERIZATION  July 2012   stent placed  X's  4  . COCCYX FRACTURE SURGERY  1997  . CORONARY ARTERY BYPASS GRAFT  1992  . ENDARTERECTOMY Left 09/07/2012  Procedure: ENDARTERECTOMY CAROTID with patch angioplasty;  Surgeon: Mal Misty, MD;  Location: [facility];  Service: Vascular;  Laterality: Left;  . LEFT HEART CATHETERIZATION WITH CORONARY/GRAFT ANGIOGRAM N/A 12/21/2011   Procedure: LEFT HEART CATHETERIZATION WITH Beatrix Fetters;  Surgeon: Hillary Bow, MD;  Location: [facility] C[facility] LAB;  Service: Cardiovascular;  Laterality: N/A;  . TONSILLECTOMY      Current Outpatient Medications  Medication Sig Dispense Refill  . amitriptyline (ELAVIL) 50 MG tablet Take 1 tablet (50 mg total) by mouth at bedtime as needed for sleep. 90 tablet 1  . aspirin (ASPIRIN EC) 81 MG EC tablet Take 81 mg by mouth daily.      . Cholecalciferol (VITAMIN D3) 5000 units CAPS Take 1 tablet by mouth daily.     . clopidogrel (PLAVIX) 75 MG tablet TAKE 1 TABLET BY MOUTH EVERY DAY  90 tablet 0  . diphenhydrAMINE (BENADRYL) 25 MG tablet Take 25 mg by mouth daily as needed for allergies.     . metoprolol succinate (TOPROL-XL) 25 MG 24 hr tablet TAKE 1/2 TABLET BY MOUTH TWICE A DAY 90 tablet 0  . mometasone-formoterol (DULERA) 100-5 MCG/ACT AERO Inhale 2 puffs into the lungs 2 (two) times daily. Prn, to last 3 months 1 Inhaler 3  . montelukast (SINGULAIR) 10 MG tablet Take 1 tablet (10 mg total) by mouth daily. 90 tablet 2  . niacin (NIASPAN) 1000 MG CR tablet Take one (1) tablet (1,000 mg) by mouth daily at bedtime with aspirin. Please keep 04-25-17 at 10 am appointment for refills. 90 tablet 0  . nitroGLYCERIN (NITROSTAT) 0.4 MG SL tablet Place 1 tablet (0.4 mg total) under the tongue every 5 (five) minutes as needed for chest pain. 25 tablet 1  . pantoprazole (PROTONIX) 40 MG tablet TAKE 1 TABLET (40 MG TOTAL) BY MOUTH DAILY. 90 tablet 0  . pravastatin (PRAVACHOL) 40 MG tablet Take 1 tablet (40 mg total) by mouth every evening. 90 tablet 3  . tamsulosin (FLOMAX) 0.4 MG CAPS Take 1 capsule (0.4 mg total) by mouth daily after supper. 90 capsule 3   No current facility-administered medications for this visit.     Allergies:   Atenolol; Rosuvastatin; Simvastatin; Ultram [tramadol]; Zofran [ondansetron hcl]; Adhesive [tape]; Codeine; and Ezetimibe-simvastatin   Social History:  The patient  reports that he has quit smoking. His smoking use included cigarettes. He started smoking about 62 years ago. he has never used smokeless tobacco. He reports that he drinks alcohol. He reports that he does not use drugs.   Family History:  The patient's family history includes Aneurysm in his unknown relative; Diabetes in his mother and sister; Heart attack in his father; Heart disease in his father and mother; Hyperlipidemia in his son and unknown relative; Hypertension in his son and son; Kidney disease in his sister; Liver cancer in his unknown relative.   ROS:  Please see the history of  present illness.  Otherwise, review of systems is positive for back pain, balance problems.  All other systems are reviewed and negative.    PHYSICAL EXAM: VS:  BP 100/60   Pulse 80   Ht 5\' 11"  (1.803 m)   Wt 198 lb 12.8 oz (90.2 kg)   SpO2 94%   BMI 27.73 kg/m  , BMI Body mass index is 27.73 kg/m. GEN: Well nourished, well developed, pleasant elderly male in no acute distress  HEENT: normal  Neck: no JVD, no masses. No carotid bruits Cardiac: RRR without murmur or gallop  Respiratory:  clear to auscultation bilaterally, normal work of breathing GI: soft, nontender, nondistended, + BS MS: no deformity or atrophy  Ext: no pretibial edema, pedal pulses 2+= bilaterally Skin: warm and dry, no rash Neuro:  Strength and sensation are intact Psych: euthymic mood, full affect  EKG:  EKG is ordered today. The ekg ordered today shows NSR with first degree AV block, LAD, otherwise normal  Recent Labs: 05/24/2016: Hemoglobin 16.6; Platelets 186.0; TSH 3.92 11/24/2016: ALT 15; BUN 15; Creatinine, Ser 1.08; Potassium 4.3; Sodium 138   Lipid Panel     Component Value Date/Time   CHOL 118 11/24/2016 0851   TRIG 136.0 11/24/2016 0851   HDL 34.70 (L) 11/24/2016 0851   CHOLHDL 3 11/24/2016 0851   VLDL 27.2 11/24/2016 0851   LDLCALC 56 11/24/2016 0851   LDLDIRECT 87.6 09/19/2008 1546      Wt Readings from Last 3 Encounters:  04/25/17 198 lb 12.8 oz (90.2 kg)  12/01/16 206 lb (93.4 kg)  2016-11-14 207 lb (93.9 kg)     Cardiac Studies Reviewed: Carotid duplex 11-14-16: Right ICA < 40%, Left ICA - patent CEA site  ASSESSMENT AND PLAN: 1.  CAD, native vessel and bypass graft disease: pt doing well without angina. Continue same Rx (ASA, plavix, statin, beta-blcoker). He will call if problems. NTG Rx refilled (he has not taken).   2. Carotid stenosis without hx of stroke: followed by [facility]. Duplex findings reviewed as above.   3. HTN: BP well-controlled on current Rx (low dose Toprol  XL)  4. Hyperlipidemia: treated with niacin and pravastatin.   Current medicines are reviewed with the patient today.  The patient does not have concerns regarding medicines.  Labs/ tests ordered today include:   Orders Placed This Encounter  Procedures  . EKG 12-Lead    Disposition:   FU one year  Signed, Sherren Mocha, MD  04/27/2017 10:56 PM    [address], [place], [place]  [address] Phone: [phone]; Fax: [phone]

## 2017-04-27 ENCOUNTER — Encounter: Payer: Self-pay | Admitting: Cardiovascular Disease

## 2017-04-29 ENCOUNTER — Other Ambulatory Visit: Payer: Self-pay | Admitting: Cardiovascular Disease

## 2017-04-29 MED ORDER — NITROGLYCERIN 0.4 MG SL SUBL: 0.4000 mg | SUBLINGUAL_TABLET | SUBLINGUAL | 2 refills | Status: DC | PRN

## 2017-05-01 ENCOUNTER — Other Ambulatory Visit: Payer: Self-pay | Admitting: Cardiovascular Disease

## 2017-05-16 ENCOUNTER — Other Ambulatory Visit: Payer: Self-pay | Admitting: Cardiovascular Disease

## 2017-05-16 MED ORDER — METOPROLOL SUCCINATE ER 25 MG PO TB24: 12.5000 mg | ORAL_TABLET | Freq: Two times a day (BID) | ORAL | 3 refills | Status: DC

## 2017-05-25 ENCOUNTER — Other Ambulatory Visit: Payer: Self-pay | Admitting: Cardiovascular Disease

## 2017-06-02 ENCOUNTER — Other Ambulatory Visit: Payer: Self-pay | Admitting: Cardiovascular Disease

## 2017-06-06 ENCOUNTER — Other Ambulatory Visit (INDEPENDENT_AMBULATORY_CARE_PROVIDER_SITE_OTHER): Payer: Medicare Other

## 2017-06-06 DIAGNOSIS — Z0000 Encounter for general adult medical examination without abnormal findings: Secondary | ICD-10-CM

## 2017-06-06 DIAGNOSIS — E119 Type 2 diabetes mellitus without complications: Secondary | ICD-10-CM | POA: Diagnosis not present

## 2017-06-06 LAB — CBC WITH DIFFERENTIAL/PLATELET
Basophils Absolute: 0 10*3/uL (ref 0.0–0.1)
Basophils Relative: 0.4 % (ref 0.0–3.0)
Eosinophils Absolute: 0.4 10*3/uL (ref 0.0–0.7)
Eosinophils Relative: 4.3 % (ref 0.0–5.0)
HCT: 46.6 % (ref 39.0–52.0)
Hemoglobin: 16.1 g/dL (ref 13.0–17.0)
Lymphocytes Relative: 43.2 % (ref 12.0–46.0)
Lymphs Abs: 3.9 10*3/uL (ref 0.7–4.0)
MCHC: 34.6 g/dL (ref 30.0–36.0)
MCV: 100.5 fl — ABNORMAL HIGH (ref 78.0–100.0)
Monocytes Absolute: 0.7 10*3/uL (ref 0.1–1.0)
Monocytes Relative: 7.8 % (ref 3.0–12.0)
Neutro Abs: 4 10*3/uL (ref 1.4–7.7)
Neutrophils Relative %: 44.3 % (ref 43.0–77.0)
Platelets: 166 10*3/uL (ref 150.0–400.0)
RBC: 4.63 Mil/uL (ref 4.22–5.81)
RDW: 13.6 % (ref 11.5–15.5)
WBC: 9.1 10*3/uL (ref 4.0–10.5)

## 2017-06-06 LAB — BASIC METABOLIC PANEL
BUN: 15 mg/dL (ref 6–23)
CO2: 28 mEq/L (ref 19–32)
Calcium: 9.6 mg/dL (ref 8.4–10.5)
Chloride: 103 mEq/L (ref 96–112)
Creatinine, Ser: 1.2 mg/dL (ref 0.40–1.50)
GFR: 61.59 mL/min (ref >60.00)
Glucose, Bld: 122 mg/dL — ABNORMAL HIGH (ref 70–99)
Potassium: 4.1 mEq/L (ref 3.5–5.1)
Sodium: 141 mEq/L (ref 135–145)

## 2017-06-06 LAB — HEPATIC FUNCTION PANEL
ALT: 17 U/L (ref 0–53)
AST: 20 U/L (ref 0–37)
Albumin: 3.9 g/dL (ref 3.5–5.2)
Alkaline Phosphatase: 62 U/L (ref 39–117)
Bilirubin, Direct: 0.2 mg/dL (ref 0.0–0.3)
Total Bilirubin: 1 mg/dL (ref 0.2–1.2)
Total Protein: 7.1 g/dL (ref 6.0–8.3)

## 2017-06-06 LAB — LIPID PANEL
Cholesterol: 139 mg/dL (ref 0–200)
HDL: 39.3 mg/dL (ref >39.00)
LDL Cholesterol: 77 mg/dL (ref 0–99)
NonHDL: 99.84
Total CHOL/HDL Ratio: 4
Triglycerides: 115 mg/dL (ref 0.0–149.0)
VLDL: 23 mg/dL (ref 0.0–40.0)

## 2017-06-06 LAB — HEMOGLOBIN A1C: Hgb A1c MFr Bld: 6.5 % (ref 4.6–6.5)

## 2017-06-06 LAB — TSH: TSH: 4.71 u[IU]/mL — ABNORMAL HIGH (ref 0.35–4.50)

## 2017-06-07 ENCOUNTER — Other Ambulatory Visit: Payer: Self-pay | Admitting: Internal Medicine

## 2017-06-08 ENCOUNTER — Encounter: Payer: Self-pay | Admitting: Internal Medicine

## 2017-06-08 ENCOUNTER — Ambulatory Visit (INDEPENDENT_AMBULATORY_CARE_PROVIDER_SITE_OTHER): Payer: Medicare Other | Admitting: *Deleted

## 2017-06-08 ENCOUNTER — Ambulatory Visit: Payer: Medicare Other | Admitting: Internal Medicine

## 2017-06-08 VITALS — BP 122/84 | HR 73 | Resp 18 | Ht 71.0 in | Wt 203.0 lb

## 2017-06-08 VITALS — BP 122/84 | HR 73 | Temp 97.6°F | Ht 71.0 in | Wt 203.0 lb

## 2017-06-08 DIAGNOSIS — R718 Other abnormality of red blood cells: Secondary | ICD-10-CM

## 2017-06-08 DIAGNOSIS — Z0000 Encounter for general adult medical examination without abnormal findings: Secondary | ICD-10-CM

## 2017-06-08 DIAGNOSIS — M549 Dorsalgia, unspecified: Secondary | ICD-10-CM | POA: Diagnosis not present

## 2017-06-08 DIAGNOSIS — E119 Type 2 diabetes mellitus without complications: Secondary | ICD-10-CM

## 2017-06-08 MED ORDER — ZOSTER VAC RECOMB ADJUVANTED 50 MCG/0.5ML IM SUSR: 0.5000 mL | Freq: Once | INTRAMUSCULAR | 1 refills | Status: AC

## 2017-06-08 NOTE — Patient Instructions (Signed)
Continue doing brain stimulating activities (puzzles, reading, adult coloring books, staying active) to keep memory sharp.   Continue to eat heart healthy diet (full of fruits, vegetables, whole grains, lean protein, water--limit salt, fat, and sugar intake) and increase physical activity as tolerated.   Sean Lozano , Thank you for taking time to come for your Medicare Wellness Visit. I appreciate your ongoing commitment to your health goals. Please review the following plan we discussed and let me know if I can assist you in the future.   These are the goals we discussed: Goals    . Patient Stated     Stay as healthy and as independent as possible, continue to eat healthy and be active daily.       This is a list of the screening recommended for you and due dates:  Health Maintenance  Topic Date Due  . Urine Protein Check  05/24/2017  . Complete foot exam   06/02/2017  . Eye exam for diabetics  12/29/2017  . Tetanus Vaccine  09/20/2018  . Flu Shot  Completed  . Pneumonia vaccines  Completed

## 2017-06-08 NOTE — Progress Notes (Addendum)
Subjective:   Sean Lozano is a 84 y.o. male who presents for an Initial Medicare Annual Wellness Visit.  Review of Systems  No ROS.  Medicare Wellness Visit.4 Additional risk factors are reflected in the social history.  Cardiac Risk Factors include: advanced age (>60men, >8 women);diabetes mellitus;dyslipidemia;male gender  Sleep patterns: feels rested on waking, gets up 1 times nightly to void and sleeps 7-8 hours nightly.   Home Safety/Smoke Alarms: Feels safe in home. Smoke alarms in place.  Living environment; residence and Firearm Safety: 2-story house, no firearms.Lives with wife, no needs for DME, good support system Seat Belt Safety/Bike Helmet: Wears seat belt.   PSA-  Lab Results  Component Value Date   PSA 1.47 11/20/2013   PSA 1.51 10/24/2012   PSA 1.30 09/29/2010      Objective:    Today's Vitals   06/08/17 0952  BP: 122/84  Pulse: 73  Resp: 18  SpO2: 93%  Weight: 203 lb (92.1 kg)  Height: 5\' 11"  (1.803 m)   Body mass index is 28.31 kg/m.  Advanced Directives 06/08/2017 10/19/2016 10/20/2015 10/17/2014 09/07/2012 09/05/2012 07/09/2012  Does Patient Have a Medical Advance Directive? Yes Yes Yes Yes Patient does not have advance directive Patient has advance directive, copy not in chart Patient has advance directive, copy not in chart  Type of Advance Directive [place];Living will [place];Living will Living will [place];Living will - - Living will;Healthcare Power of Attorney  Does patient want to make changes to medical advance directive? - - - No - Patient declined - - -  Copy of [place] in Chart? No - copy requested No - copy requested - No - copy requested - - Copy requested from family  Pre-existing out of facility DNR order (yellow form or pink MOST form) - - - - No - No    Current Medications (verified) Outpatient Encounter Medications as of 06/08/2017  Medication Sig    . amitriptyline (ELAVIL) 50 MG tablet TAKE 1 TABLET (50 MG TOTAL) BY MOUTH AT BEDTIME AS NEEDED FOR SLEEP.  Marland Kitchen aspirin (ASPIRIN EC) 81 MG EC tablet Take 81 mg by mouth daily.    . Cholecalciferol (VITAMIN D3) 5000 units CAPS Take 1 tablet by mouth daily.   . clopidogrel (PLAVIX) 75 MG tablet TAKE 1 TABLET BY MOUTH EVERY DAY  . diphenhydrAMINE (BENADRYL) 25 MG tablet Take 25 mg by mouth daily as needed for allergies.   . metoprolol succinate (TOPROL-XL) 25 MG 24 hr tablet Take 0.5 tablets (12.5 mg total) by mouth 2 (two) times daily.  . mometasone-formoterol (DULERA) 100-5 MCG/ACT AERO Inhale 2 puffs into the lungs 2 (two) times daily. Prn, to last 3 months  . montelukast (SINGULAIR) 10 MG tablet Take 1 tablet (10 mg total) by mouth daily.  . niacin (NIASPAN) 1000 MG CR tablet TAKE 1 TABLET BY MOUTH AT BEDTIME WITH ASPIRIN.  Marland Kitchen nitroGLYCERIN (NITROSTAT) 0.4 MG SL tablet Place 1 tablet (0.4 mg total) under the tongue every 5 (five) minutes as needed for chest pain.  . pantoprazole (PROTONIX) 40 MG tablet TAKE 1 TABLET BY MOUTH EVERY DAY  . pravastatin (PRAVACHOL) 40 MG tablet TAKE 1 TABLET BY MOUTH EVERY EVENING (FILLABLE 04/03/16)  . tamsulosin (FLOMAX) 0.4 MG CAPS Take 1 capsule (0.4 mg total) by mouth daily after supper.   No facility-administered encounter medications on file as of 06/08/2017.     Allergies (verified) Atenolol; Rosuvastatin; Simvastatin; Ultram [  tramadol]; Zofran [ondansetron hcl]; Adhesive [tape]; Codeine; and Ezetimibe-simvastatin   History: Past Medical History:  Diagnosis Date  . 1st degree AV block   . Arthritis   . Asthma   . BARRETTS ESOPHAGUS 06/02/2007  . Benign prostatic hypertrophy   . Bladder neck obstruction 09/29/2010  . CAD (coronary artery disease)    a. CABG x 6 in 1992. b. s/p DES x 2 to SVG -> OM1/OM2 in 09/2010. c. s/p DES to mid LAD 10/2010. d. NSTEMI (off DAPT) s/p PTCA of instent renarrowing of DES in SVG-OM, Recs for lifelong DAPT   . Carotid  artery disease ([place])    Doppler, May, 2014, 1-61% R. ICA, 09-60% LICA, with recent syncope patient sent for consult with vascular surgery  . Carotid bruit    April, 2014  . Cervical disc disease   . Concussion July 09, 2012   Scalp  . Depression   . Diabetes mellitus    type II-diet  . Diverticulosis   . Ejection fraction    EF 60%, echo, June, 2012  . Fall at home July 09, 2012  . GERD (gastroesophageal reflux disease)   . Hearing loss 2014  . Hx of CABG    1992  . Hyperlipidemia    intolerance to Crestor Zocor Vytorin. He tolerates Pravachol...low HDL  . Paresthesias    successful surgery by Dr. Earnie Larsson  . Prostatitis   . Rosacea   . Sinus bradycardia    asymptomatic   . SKIN LESION 06/02/2007  . Subdural hematoma ([facility])    Subdural hematoma secondary to falling off a ladder  April, 2014,  medical therapy,  subdural did not expand while blood levels of ddual antiplatelet therapy decreased over one week.  Episode of chest pain in the hospital,, dual antiplatelet therapy restarted approximately one week after it was held  . Syncope    The patient lost consciousness and hit his head July 09 2012, not clear yet if he had true syncope   Past Surgical History:  Procedure Laterality Date  . APPENDECTOMY    . Barretts Esophagus   20/20/09  . Bladder neck obstruction  09/29/10  . C-spine disc surgery  03/2006  . C4-5 surgery  01/2009  . CARDIAC CATHETERIZATION  July 2012   stent placed  X's  4  . COCCYX FRACTURE SURGERY  1997  . CORONARY ARTERY BYPASS GRAFT  1992  . ENDARTERECTOMY Left 09/07/2012   Procedure: ENDARTERECTOMY CAROTID with patch angioplasty;  Surgeon: Mal Misty, MD;  Location: [facility];  Service: Vascular;  Laterality: Left;  . LEFT HEART CATHETERIZATION WITH CORONARY/GRAFT ANGIOGRAM N/A 12/21/2011   Procedure: LEFT HEART CATHETERIZATION WITH Beatrix Fetters;  Surgeon: Hillary Bow, MD;  Location: [facility] CATH LAB;  Service: Cardiovascular;  Laterality:  N/A;  . TONSILLECTOMY     Family History  Problem Relation Age of Onset  . Heart disease Father        mother  . Heart attack Father   . Diabetes Sister        mother  . Kidney disease Sister   . Hypertension Son   . Hyperlipidemia Son   . Hypertension Son   . Diabetes Mother   . Heart disease Mother   . Aneurysm Unknown   . Hyperlipidemia Unknown   . Liver cancer Unknown        grandfather   Social History   Socioeconomic History  . Marital status: Married    Spouse name: None  .  Number of children: 3  . Years of education: None  . Highest education level: None  Social Needs  . Financial resource strain: Not hard at all  . Food insecurity - worry: Never true  . Food insecurity - inability: Never true  . Transportation needs - medical: No  . Transportation needs - non-medical: No  Occupational History  . Occupation: retired-real estate  Tobacco Use  . Smoking status: Former Smoker    Types: Cigarettes    Start date: 01/03/1955  . Smokeless tobacco: Never Used  Substance and Sexual Activity  . Alcohol use: Yes    Comment: occ  . Drug use: No  . Sexual activity: No  Other Topics Concern  . None  Social History Narrative      Primary caregiver for wife who has dementia.   Tobacco Counseling Counseling given: Not Answered  Activities of Daily Living In your present state of health, do you have any difficulty performing the following activities: 06/08/2017  Hearing? N  Vision? N  Difficulty concentrating or making decisions? N  Walking or climbing stairs? N  Dressing or bathing? N  Doing errands, shopping? N  Preparing Food and eating ? N  Using the Toilet? N  In the past six months, have you accidently leaked urine? N  Do you have problems with loss of bowel control? N  Managing your Medications? N  Managing your Finances? N  Housekeeping or managing your Housekeeping? N  Some recent data might be hidden     Immunizations and Health  Maintenance Immunization History  Administered Date(s) Administered  . Influenza, High Dose Seasonal PF 05/30/2015  . Influenza,inj,Quad PF,6+ Mos 04/19/2013  . Influenza-Unspecified 05/13/2014  . Pneumococcal Conjugate-13 11/20/2013  . Pneumococcal Polysaccharide-23 04/15/2006  . Td 09/19/2008  . Zoster 12/03/2013   Health Maintenance Due  Topic Date Due  . URINE MICROALBUMIN  05/24/2017  . FOOT EXAM  06/02/2017    Patient Care Team: Biagio Borg, MD as PCP - Cyndia Diver, MD as PCP - Cardiology (Cardiology) Mal Misty, MD as Consulting Physician (Vascular Surgery) Michael Boston, MD as Referring Physician (Neurology) Earnie Larsson, MD as Consulting Physician (Neurosurgery)  Indicate any recent Medical Services you may have received from other than [facility] providers in the past year (date may be approximate).    Assessment:   This is a routine wellness examination for Sean Lozano. Physical assessment deferred to PCP.   Hearing/Vision screen Hearing Screening Comments: Able to hear conversational tones w/o difficulty. No issues reported.  Passed whisper test Vision Screening Comments: Dr. Katy Fitch, appointment yearly   Dietary issues and exercise activities discussed: Current Exercise Habits: The patient does not participate in regular exercise at present, Exercise limited by: None identified  Diet (meal preparation, eat out, water intake, caffeinated beverages, dairy products, fruits and vegetables): in general, a "healthy" diet  , well balanced, eats a variety of fruits and vegetables daily, limits salt, fat/cholesterol, sugar,carbohydrates,caffeine, drinks 6-8 glasses of water daily.  Reviewed heart healthy and diabetic diet  Goals    . Patient Stated     Stay as healthy and as independent as possible, continue to eat healthy and be active daily.      Depression Screen PHQ 2/9 Scores 06/08/2017 06/08/2017 05/30/2015 11/21/2014  PHQ - 2 Score 1 0 0 0  PHQ- 9  Score 2 - - -    Fall Risk Fall Risk  06/08/2017 06/08/2017 06/08/2017 11/21/2014 11/20/2013  Falls in the past year? Yes -  Yes No No  Number falls in past yr: 1 (No Data) 2 or more - -  Comment - slip and fall on front steps with ice - - -  Injury with Fall? No - No - -  Risk Factor Category  - - - - -  Risk for fall due to : Impaired balance/gait;Impaired mobility - - - -  Follow up Falls prevention discussed - - - -   Cognitive Function: MMSE - Mini Mental State Exam 06/08/2017  Orientation to time 5  Orientation to Place 5  Registration 3  Attention/ Calculation 4  Recall 1  Language- name 2 objects 2  Language- repeat 1  Language- follow 3 step command 3  Language- read & follow direction 1  Write a sentence 1  Copy design 1  Total score 27        Screening Tests Health Maintenance  Topic Date Due  . URINE MICROALBUMIN  05/24/2017  . FOOT EXAM  06/02/2017  . OPHTHALMOLOGY EXAM  12/29/2017  . TETANUS/TDAP  09/20/2018  . INFLUENZA VACCINE  Completed  . PNA vac Low Risk Adult  Completed      Plan:    Continue doing brain stimulating activities (puzzles, reading, adult coloring books, staying active) to keep memory sharp.   Continue to eat heart healthy diet (full of fruits, vegetables, whole grains, lean protein, water--limit salt, fat, and sugar intake) and increase physical activity as tolerated.  I have personally reviewed and noted the following in the patient's chart:   . Medical and social history . Use of alcohol, tobacco or illicit drugs  . Current medications and supplements . Functional ability and status . Nutritional status . Physical activity . Advanced directives . List of other physicians . Vitals . Screenings to include cognitive, depression, and falls . Referrals and appointments  In addition, I have reviewed and discussed with patient certain preventive protocols, quality metrics, and best practice recommendations. A written personalized care  plan for preventive services as well as general preventive health recommendations were provided to patient.     Michiel Cowboy, RN   06/08/2017   Medical screening examination/treatment/procedure(s) were performed by non-physician practitioner and as supervising physician I was immediately available for consultation/collaboration. I agree with above. Cathlean Cower, MD

## 2017-06-08 NOTE — Progress Notes (Signed)
Subjective:    Patient ID: Sean Lozano, male    DOB: 04-22-1933, 84 y.o.   MRN: [id number]  HPI  Here for wellness and f/u;  Overall doing ok;  Pt denies Chest pain, worsening SOB, DOE, wheezing, orthopnea, PND, worsening LE edema, palpitations, dizziness or syncope.  Pt denies neurological change such as new headache, facial or extremity weakness.  Pt denies polydipsia, polyuria, or low sugar symptoms. Pt states overall good compliance with treatment and medications, good tolerability, and has been trying to follow appropriate diet.  Pt denies worsening depressive symptoms, suicidal ideation or panic. No fever, night sweats, wt loss, loss of appetite, or other constitutional symptoms.  Pt states good ability with ADL's, has low fall risk, home safety reviewed and adequate, no other significant changes in hearing or vision, and not active with exercise. 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  Has to lift her, starting to getting back ache  Asks for handicap parking applic signed due to worsening stamina and higher risk of fall recently  No other interval hx or new complaints Past Medical History:  Diagnosis Date  . 1st degree AV block   . Arthritis   . Asthma   . BARRETTS ESOPHAGUS 06/02/2007  . Benign prostatic hypertrophy   . Bladder neck obstruction 09/29/2010  . CAD (coronary artery disease)    a. CABG x 6 in 1992. b. s/p DES x 2 to SVG -> OM1/OM2 in 09/2010. c. s/p DES to mid LAD 10/2010. d. NSTEMI (off DAPT) s/p PTCA of instent renarrowing of DES in SVG-OM, Recs for lifelong DAPT   . Carotid artery disease ([place])    Doppler, May, 2014, 6-27% R. ICA, 03-50% LICA, with recent syncope patient sent for consult with vascular surgery  . Carotid bruit    April, 2014  . Cervical disc disease   . Concussion July 09, 2012   Scalp  . Depression   . Diabetes mellitus    type II-diet  . Diverticulosis   . Ejection fraction    EF 60%, echo, June, 2012  . Fall at home  July 09, 2012  . GERD (gastroesophageal reflux disease)   . Hearing loss 2014  . Hx of CABG    1992  . Hyperlipidemia    intolerance to Crestor Zocor Vytorin. He tolerates Pravachol...low HDL  . Paresthesias    successful surgery by Dr. Earnie Larsson  . Prostatitis   . Rosacea   . Sinus bradycardia    asymptomatic   . SKIN LESION 06/02/2007  . Subdural hematoma (HCC)    Subdural hematoma secondary to falling off a ladder  April, 2014,  medical therapy,  subdural did not expand while blood levels of ddual antiplatelet therapy decreased over one week.  Episode of chest pain in the hospital,, dual antiplatelet therapy restarted approximately one week after it was held  . Syncope    The patient lost consciousness and hit his head July 09 2012, not clear yet if he had true syncope   Past Surgical History:  Procedure Laterality Date  . APPENDECTOMY    . Barretts Esophagus   20/20/09  . Bladder neck obstruction  09/29/10  . C-spine disc surgery  03/2006  . C4-5 surgery  01/2009  . CARDIAC CATHETERIZATION  July 2012   stent placed  X's  4  . COCCYX FRACTURE SURGERY  1997  . CORONARY ARTERY BYPASS GRAFT  1992  . ENDARTERECTOMY Left 09/07/2012  Procedure: ENDARTERECTOMY CAROTID with patch angioplasty;  Surgeon: Mal Misty, MD;  Location: [place];  Service: Vascular;  Laterality: Left;  . LEFT HEART CATHETERIZATION WITH CORONARY/GRAFT ANGIOGRAM N/A 12/21/2011   Procedure: LEFT HEART CATHETERIZATION WITH Beatrix Fetters;  Surgeon: Hillary Bow, MD;  [facility];  Service: Cardiovascular;  Laterality: N/A;  . TONSILLECTOMY      reports that he has quit smoking. His smoking use included cigarettes. He started smoking about 62 years ago. he has never used smokeless tobacco. He reports that he drinks alcohol. He reports that he does not use drugs. family history includes Aneurysm in his unknown relative; Diabetes in his mother and sister; Heart attack in his father; Heart  disease in his father and mother; Hyperlipidemia in his son and unknown relative; Hypertension in his son and son; Kidney disease in his sister; Liver cancer in his unknown relative. Allergies  Allergen Reactions  . Atenolol Other (See Comments)    depression  . Rosuvastatin Other (See Comments)    Severe hip pain  . Simvastatin Other (See Comments)    Severe hip pain  . Ultram [Tramadol] Nausea And Vomiting  . Zofran [Ondansetron Hcl] Nausea Only and Other (See Comments)    Severe nausea and constipation  . Adhesive [Tape] Other (See Comments)    Breakdown skin  . Codeine Other (See Comments)    Crazy feeling  . Ezetimibe-Simvastatin Other (See Comments)    Severe hip pain   Current Outpatient Medications on File Prior to Visit  Medication Sig Dispense Refill  . amitriptyline (ELAVIL) 50 MG tablet TAKE 1 TABLET (50 MG TOTAL) BY MOUTH AT BEDTIME AS NEEDED FOR SLEEP. 90 tablet 1  . aspirin (ASPIRIN EC) 81 MG EC tablet Take 81 mg by mouth daily.      . Cholecalciferol (VITAMIN D3) 5000 units CAPS Take 1 tablet by mouth daily.     . clopidogrel (PLAVIX) 75 MG tablet TAKE 1 TABLET BY MOUTH EVERY DAY 90 tablet 3  . diphenhydrAMINE (BENADRYL) 25 MG tablet Take 25 mg by mouth daily as needed for allergies.     . metoprolol succinate (TOPROL-XL) 25 MG 24 hr tablet Take 0.5 tablets (12.5 mg total) by mouth 2 (two) times daily. 90 tablet 3  . mometasone-formoterol (DULERA) 100-5 MCG/ACT AERO Inhale 2 puffs into the lungs 2 (two) times daily. Prn, to last 3 months 1 Inhaler 3  . montelukast (SINGULAIR) 10 MG tablet Take 1 tablet (10 mg total) by mouth daily. 90 tablet 2  . niacin (NIASPAN) 1000 MG CR tablet TAKE 1 TABLET BY MOUTH AT BEDTIME WITH ASPIRIN. 90 tablet 0  . nitroGLYCERIN (NITROSTAT) 0.4 MG SL tablet Place 1 tablet (0.4 mg total) under the tongue every 5 (five) minutes as needed for chest pain. 75 tablet 2  . pantoprazole (PROTONIX) 40 MG tablet TAKE 1 TABLET BY MOUTH EVERY DAY 90  tablet 3  . pravastatin (PRAVACHOL) 40 MG tablet TAKE 1 TABLET BY MOUTH EVERY EVENING (FILLABLE 04/03/16) 90 tablet 3  . tamsulosin (FLOMAX) 0.4 MG CAPS Take 1 capsule (0.4 mg total) by mouth daily after supper. 90 capsule 3   No current facility-administered medications on file prior to visit.    Review of Systems Constitutional: Negative for other unusual diaphoresis, sweats, appetite or weight changes HENT: Negative for other worsening hearing loss, ear pain, facial swelling, mouth sores or neck stiffness.   Eyes: Negative for other worsening pain, redness or other visual disturbance.  Respiratory:  Negative for other stridor or swelling Cardiovascular: Negative for other palpitations or other chest pain  Gastrointestinal: Negative for worsening diarrhea or loose stools, blood in stool, distention or other pain Genitourinary: Negative for hematuria, flank pain or other change in urine volume.  Musculoskeletal: Negative for myalgias or other joint swelling.  Skin: Negative for other color change, or other wound or worsening drainage.  Neurological: Negative for other syncope or numbness. Hematological: Negative for other adenopathy or swelling Psychiatric/Behavioral: Negative for hallucinations, other worsening agitation, SI, self-injury, or new decreased concentration All other system neg per pt    Objective:   Physical Exam BP 122/84   Pulse 73   Temp 97.6 F (36.4 C) (Oral)   Ht 5\' 11"  (1.803 m)   Wt 203 lb (92.1 kg)   SpO2 93%   BMI 28.31 kg/m  VS noted, not ill appearing Constitutional: Pt is oriented to person, place, and time. Appears well-developed and well-nourished, in no significant distress and comfortable Head: Normocephalic and atraumatic  Eyes: Conjunctivae and EOM are normal. Pupils are equal, round, and reactive to light Right Ear: External ear normal without discharge Left Ear: External ear normal without discharge Nose: Nose without discharge or  deformity Mouth/Throat: Oropharynx is without other ulcerations and moist  Neck: Normal range of motion. Neck supple. No JVD present. No tracheal deviation present or significant neck LA or mass Cardiovascular: Normal rate, regular rhythm, normal heart sounds and intact distal pulses.   Pulmonary/Chest: WOB normal and breath sounds without rales or wheezing  Abdominal: Soft. Bowel sounds are normal. NT. No HSM  Musculoskeletal: Normal range of motion. Exhibits no edema Lymphadenopathy: Has no other cervical adenopathy.  Neurological: Pt is alert and oriented to person, place, and time. Pt has normal reflexes. No cranial nerve deficit. Motor grossly intact, Gait intact Skin: Skin is warm and dry. No rash noted or new ulcerations Psychiatric:  Has normal mood and affect. Behavior is normal without agitation No other exam findings    Assessment & Plan:

## 2017-06-08 NOTE — Patient Instructions (Addendum)
Your shingles shot prescriptoin was sent to [facility]  Please start daily B12 supplement - 1 per day  You should ask your wife's physician about a Merchandiser, retail  You are given the handicap parking application signed   Please continue all other medications as before, and refills have been done if requested.  Please have the pharmacy call with any other refills you may need.  Please continue your efforts at being more active, low cholesterol diet, and weight control.  You are otherwise up to date with prevention measures today.  Please keep your appointments with your specialists as you may have planned  Please return in 6 months, or sooner if needed

## 2017-06-08 NOTE — Assessment & Plan Note (Signed)
Mild non specific, ok for tylenol prn, but ok for lazyboy lift chair for wife, and handicap parking application for hiim

## 2017-06-11 ENCOUNTER — Encounter: Payer: Self-pay | Admitting: Internal Medicine

## 2017-06-11 NOTE — Assessment & Plan Note (Signed)
Also for b12 with labs and take otc b12 daily

## 2017-06-11 NOTE — Assessment & Plan Note (Signed)

## 2017-06-21 ENCOUNTER — Other Ambulatory Visit: Payer: Self-pay | Admitting: Internal Medicine

## 2017-07-23 ENCOUNTER — Other Ambulatory Visit: Payer: Self-pay | Admitting: Internal Medicine

## 2017-08-29 ENCOUNTER — Other Ambulatory Visit: Payer: Self-pay | Admitting: Cardiovascular Disease

## 2017-10-20 ENCOUNTER — Other Ambulatory Visit (INDEPENDENT_AMBULATORY_CARE_PROVIDER_SITE_OTHER): Payer: Medicare Other

## 2017-10-20 ENCOUNTER — Encounter: Payer: Self-pay | Admitting: Internal Medicine

## 2017-10-20 ENCOUNTER — Ambulatory Visit: Payer: Medicare Other | Admitting: Internal Medicine

## 2017-10-20 VITALS — BP 104/72 | HR 75 | Ht 71.0 in | Wt 199.0 lb

## 2017-10-20 DIAGNOSIS — R29818 Other symptoms and signs involving the nervous system: Secondary | ICD-10-CM | POA: Diagnosis not present

## 2017-10-20 DIAGNOSIS — I639 Cerebral infarction, unspecified: Secondary | ICD-10-CM | POA: Diagnosis not present

## 2017-10-20 DIAGNOSIS — E785 Hyperlipidemia, unspecified: Secondary | ICD-10-CM | POA: Diagnosis not present

## 2017-10-20 DIAGNOSIS — E119 Type 2 diabetes mellitus without complications: Secondary | ICD-10-CM | POA: Diagnosis not present

## 2017-10-20 DIAGNOSIS — R531 Weakness: Secondary | ICD-10-CM | POA: Diagnosis not present

## 2017-10-20 LAB — URINALYSIS, ROUTINE W REFLEX MICROSCOPIC
Bilirubin Urine: NEGATIVE
Hgb urine dipstick: NEGATIVE
Ketones, ur: NEGATIVE
Leukocytes, UA: NEGATIVE
Nitrite: NEGATIVE
RBC / HPF: NONE SEEN (ref 0–?)
Specific Gravity, Urine: 1.01 (ref 1.000–1.030)
Total Protein, Urine: NEGATIVE
Urine Glucose: NEGATIVE
Urobilinogen, UA: 1 (ref 0.0–1.0)
pH: 6 (ref 5.0–8.0)

## 2017-10-20 LAB — LIPID PANEL
Cholesterol: 130 mg/dL (ref 0–200)
HDL: 36.8 mg/dL — ABNORMAL LOW (ref >39.00)
LDL Cholesterol: 67 mg/dL (ref 0–99)
NonHDL: 93.01
Total CHOL/HDL Ratio: 4
Triglycerides: 129 mg/dL (ref 0.0–149.0)
VLDL: 25.8 mg/dL (ref 0.0–40.0)

## 2017-10-20 LAB — BASIC METABOLIC PANEL
BUN: 21 mg/dL (ref 6–23)
CO2: 29 mEq/L (ref 19–32)
Calcium: 9.7 mg/dL (ref 8.4–10.5)
Chloride: 101 mEq/L (ref 96–112)
Creatinine, Ser: 1.29 mg/dL (ref 0.40–1.50)
GFR: 56.61 mL/min — ABNORMAL LOW (ref >60.00)
Glucose, Bld: 114 mg/dL — ABNORMAL HIGH (ref 70–99)
Potassium: 4.1 mEq/L (ref 3.5–5.1)
Sodium: 137 mEq/L (ref 135–145)

## 2017-10-20 LAB — CBC WITH DIFFERENTIAL/PLATELET
Basophils Absolute: 0 10*3/uL (ref 0.0–0.1)
Basophils Relative: 0.4 % (ref 0.0–3.0)
Eosinophils Absolute: 0.4 10*3/uL (ref 0.0–0.7)
Eosinophils Relative: 4.2 % (ref 0.0–5.0)
HCT: 47 % (ref 39.0–52.0)
Hemoglobin: 16 g/dL (ref 13.0–17.0)
Lymphocytes Relative: 36 % (ref 12.0–46.0)
Lymphs Abs: 3.2 10*3/uL (ref 0.7–4.0)
MCHC: 34.1 g/dL (ref 30.0–36.0)
MCV: 101 fl — ABNORMAL HIGH (ref 78.0–100.0)
Monocytes Absolute: 0.6 10*3/uL (ref 0.1–1.0)
Monocytes Relative: 7.2 % (ref 3.0–12.0)
Neutro Abs: 4.6 10*3/uL (ref 1.4–7.7)
Neutrophils Relative %: 52.2 % (ref 43.0–77.0)
Platelets: 170 10*3/uL (ref 150.0–400.0)
RBC: 4.65 Mil/uL (ref 4.22–5.81)
RDW: 13.5 % (ref 11.5–15.5)
WBC: 8.9 10*3/uL (ref 4.0–10.5)

## 2017-10-20 LAB — HEPATIC FUNCTION PANEL
ALT: 16 U/L (ref 0–53)
AST: 19 U/L (ref 0–37)
Albumin: 4.2 g/dL (ref 3.5–5.2)
Alkaline Phosphatase: 69 U/L (ref 39–117)
Bilirubin, Direct: 0.2 mg/dL (ref 0.0–0.3)
Total Bilirubin: 0.9 mg/dL (ref 0.2–1.2)
Total Protein: 7.5 g/dL (ref 6.0–8.3)

## 2017-10-20 LAB — TSH: TSH: 2.87 u[IU]/mL (ref 0.35–4.50)

## 2017-10-20 LAB — VITAMIN B12: Vitamin B-12: 652 pg/mL (ref 211–911)

## 2017-10-20 LAB — HEMOGLOBIN A1C: Hgb A1c MFr Bld: 6.5 % (ref 4.6–6.5)

## 2017-10-20 NOTE — Assessment & Plan Note (Addendum)
Subacute, clinical dx only, d/w son and pt, high suspicion for small left brain subacute stroke, declines ED for now, will have MRI brain asap, ECG, and hold on other imaging for now until more definite diagnosis.  [place] for referral Neurology, cont all same med for now, and for [facility] with RN and PT and aide  Note:  Total time for pt hx, exam, review of record with pt in the room, determination of diagnoses and plan for further eval and tx is > 40 min, with over 50% spent in coordination and counseling of patient including the differential dx, tx, further evaluation and other management of stroke, weakness, DM, HLD

## 2017-10-20 NOTE — Assessment & Plan Note (Signed)
For Home PT as above, and labs as ordered

## 2017-10-20 NOTE — Patient Instructions (Addendum)
Your EKG was OK today  You will be contacted regarding the referral for: MRI brain - to see [facility] now, and also for Neurology, as well as [place] with RN, PT, OT and speech therapy  Please continue all other medications as before, and refills have been done if requested.  Please have the pharmacy call with any other refills you may need.  Please keep your appointments with your specialists as you may have planned  Please go to the LAB in the Basement (turn left off the elevator) for the tests to be done today  /You will be contacted by phone if any changes need to be made immediately.  Otherwise, you will receive a letter about your results with an explanation, but please check with MyChart first.  Please remember to sign up for MyChart if you have not done so, as this will be important to you in the future with finding out test results, communicating by private email, and scheduling acute appointments online when needed.  Please return in 3 months, or sooner if needed

## 2017-10-20 NOTE — Assessment & Plan Note (Signed)
Intolerant to several statins, cont current pravastatin

## 2017-10-20 NOTE — Assessment & Plan Note (Signed)
stable overall by history and exam, recent data reviewed with pt, and pt to continue medical treatment as before,  to f/u any worsening symptoms or concerns Lab Results  Component Value Date   HGBA1C 6.5 06/06/2017  for f/u lab today

## 2017-10-20 NOTE — Progress Notes (Signed)
Subjective:    Patient ID: Sean Lozano, male    DOB: Dec 10, 1933, 84 y.o.   MRN: [id number]  HPI    Here with son, mentions several falls in the garage over the past 6-8 mo but didn't think much of it; more recently had 3 falls since xmas, no injury or head strike, has a left trick knee that sometimes seems unstable; son reports 2 wks pt is frusttrated over hard to come up with words, slurring words, no facial droop, more difficulty with operating remote for TV and overall some more confused and also worsening left leg weakness (not sure when started, no LBP).  On plavix, asa , statin.  Waking with wife cane, using this now for about 3 wks, helps with balance. Last fall was actually 2 mo.  Pt denies fever, wt loss, night sweats, loss of appetite, or other constitutional symptoms  Pt denies chest pain, increased sob or doe, wheezing, orthopnea, PND, increased LE swelling, palpitations, dizziness or syncope. No hx of afib.   Pt denies polydipsia, polyuria, but son smells urinr today and may have had a mild wetting accident of which he is unaware Past Medical History:  Diagnosis Date  . 1st degree AV block   . Arthritis   . Asthma   . BARRETTS ESOPHAGUS 06/02/2007  . Benign prostatic hypertrophy   . Bladder neck obstruction 09/29/2010  . CAD (coronary artery disease)    a. CABG x 6 in 1992. b. s/p DES x 2 to SVG -> OM1/OM2 in 09/2010. c. s/p DES to mid LAD 10/2010. d. NSTEMI (off DAPT) s/p PTCA of instent renarrowing of DES in SVG-OM, Recs for lifelong DAPT   . Carotid artery disease ([place])    Doppler, May, 2014, 9-39% R. ICA, 03-00% LICA, with recent syncope patient sent for consult with vascular surgery  . Carotid bruit    April, 2014  . Cervical disc disease   . Concussion July 09, 2012   Scalp  . Depression   . Diabetes mellitus    type II-diet  . Diverticulosis   . Ejection fraction    EF 60%, echo, June, 2012  . Fall at home July 09, 2012  . GERD (gastroesophageal reflux disease)     . Hearing loss 2014  . Hx of CABG    1992  . Hyperlipidemia    intolerance to Crestor Zocor Vytorin. He tolerates Pravachol...low HDL  . Paresthesias    successful surgery by Dr. Earnie Larsson  . Prostatitis   . Rosacea   . Sinus bradycardia    asymptomatic   . SKIN LESION 06/02/2007  . Subdural hematoma (HCC)    Subdural hematoma secondary to falling off a ladder  April, 2014,  medical therapy,  subdural did not expand while blood levels of ddual antiplatelet therapy decreased over one week.  Episode of chest pain in the hospital,, dual antiplatelet therapy restarted approximately one week after it was held  . Syncope    The patient lost consciousness and hit his head July 09 2012, not clear yet if he had true syncope   Past Surgical History:  Procedure Laterality Date  . APPENDECTOMY    . Barretts Esophagus   20/20/09  . Bladder neck obstruction  09/29/10  . C-spine disc surgery  03/2006  . C4-5 surgery  01/2009  . CARDIAC CATHETERIZATION  July 2012   stent placed  X's  4  . COCCYX FRACTURE SURGERY  1997  . CORONARY ARTERY BYPASS GRAFT  1992  . ENDARTERECTOMY Left 09/07/2012   Procedure: ENDARTERECTOMY CAROTID with patch angioplasty;  Surgeon: Mal Misty, MD;  Location: [place];  Service: Vascular;  Laterality: Left;  . LEFT HEART CATHETERIZATION WITH CORONARY/GRAFT ANGIOGRAM N/A 12/21/2011   Procedure: LEFT HEART CATHETERIZATION WITH Beatrix Fetters;  Surgeon: Hillary Bow, MD;  Location: [facility] CATH LAB;  Service: Cardiovascular;  Laterality: N/A;  . TONSILLECTOMY      reports that he has quit smoking. His smoking use included cigarettes. He started smoking about 62 years ago. He has never used smokeless tobacco. He reports that he drinks alcohol. He reports that he does not use drugs. family history includes Aneurysm in his unknown relative; Diabetes in his mother and sister; Heart attack in his father; Heart disease in his father and mother; Hyperlipidemia in his son  and unknown relative; Hypertension in his son and son; Kidney disease in his sister; Liver cancer in his unknown relative. Allergies  Allergen Reactions  . Atenolol Other (See Comments)    depression  . Rosuvastatin Other (See Comments)    Severe hip pain  . Simvastatin Other (See Comments)    Severe hip pain  . Ultram [Tramadol] Nausea And Vomiting  . Zofran [Ondansetron Hcl] Nausea Only and Other (See Comments)    Severe nausea and constipation  . Adhesive [Tape] Other (See Comments)    Breakdown skin  . Codeine Other (See Comments)    Crazy feeling  . Ezetimibe-Simvastatin Other (See Comments)    Severe hip pain   Current Outpatient Medications on File Prior to Visit  Medication Sig Dispense Refill  . amitriptyline (ELAVIL) 50 MG tablet TAKE 1 TABLET (50 MG TOTAL) BY MOUTH AT BEDTIME AS NEEDED FOR SLEEP. 90 tablet 1  . aspirin (ASPIRIN EC) 81 MG EC tablet Take 81 mg by mouth daily.      . Cholecalciferol (VITAMIN D3) 5000 units CAPS Take 1 tablet by mouth daily.     . clopidogrel (PLAVIX) 75 MG tablet TAKE 1 TABLET BY MOUTH EVERY DAY 90 tablet 3  . diphenhydrAMINE (BENADRYL) 25 MG tablet Take 25 mg by mouth daily as needed for allergies.     . metoprolol succinate (TOPROL-XL) 25 MG 24 hr tablet Take 0.5 tablets (12.5 mg total) by mouth 2 (two) times daily. 90 tablet 3  . mometasone-formoterol (DULERA) 100-5 MCG/ACT AERO Inhale 2 puffs into the lungs 2 (two) times daily. Prn, to last 3 months 1 Inhaler 3  . montelukast (SINGULAIR) 10 MG tablet TAKE 1 TABLET (10 MG TOTAL) BY MOUTH DAILY. 90 tablet 3  . nitroGLYCERIN (NITROSTAT) 0.4 MG SL tablet Place 1 tablet (0.4 mg total) under the tongue every 5 (five) minutes as needed for chest pain. 75 tablet 2  . pantoprazole (PROTONIX) 40 MG tablet TAKE 1 TABLET BY MOUTH EVERY DAY 90 tablet 3  . pravastatin (PRAVACHOL) 40 MG tablet TAKE 1 TABLET BY MOUTH EVERY EVENING (FILLABLE 04/03/16) 90 tablet 3  . tamsulosin (FLOMAX) 0.4 MG CAPS capsule  TAKE 1 CAPSULE (0.4 MG TOTAL) BY MOUTH DAILY AFTER SUPPER. 90 capsule 2   No current facility-administered medications on file prior to visit.    Review of Systems  Constitutional: Negative for other unusual diaphoresis or sweats HENT: Negative for ear discharge or swelling Eyes: Negative for other worsening visual disturbances Respiratory: Negative for stridor or other swelling  Gastrointestinal: Negative for worsening distension or other blood Genitourinary: Negative for retention or other urinary change Musculoskeletal: Negative for other MSK pain  or swelling Skin: Negative for color change or other new lesions Neurological: Negative for worsening tremors and other numbness  Psychiatric/Behavioral: Negative for worsening agitation or other fatigue All other system neg per pt    Objective:   Physical Exam BP 104/72 (BP Location: Left Arm, Patient Position: Sitting, Cuff Size: Normal)   Pulse 75   Ht 5\' 11"  (1.803 m)   Wt 199 lb (90.3 kg)   SpO2 94%   BMI 27.75 kg/m  VS noted,  Constitutional: Pt appears in NAD HENT: Head: NCAT.  Right Ear: External ear normal.  Left Ear: External ear normal.  Eyes: . Pupils are equal, round, and reactive to light. Conjunctivae and EOM are normal Nose: without d/c or deformity Neck: Neck supple. Gross normal ROM Cardiovascular: Normal rate and regular rhythm.   Pulmonary/Chest: Effort normal and breath sounds without rales or wheezing.  Abd:  Soft, NT, ND, + BS, no organomegaly Neurological: Pt is alert. At baseline orientation, motor grossly intact except for possible trace LUE> LLE weakness though he is right handed anyway, cn 2-12 intact though slurrs words and has some word finding difficutly Skin: Skin is warm. No rashes, other new lesions, no LE edema Psychiatric: Pt behavior is normal without agitation  No other exam findings Lab Results  Component Value Date   WBC 9.1 06/06/2017   HGB 16.1 06/06/2017   HCT 46.6 06/06/2017   PLT  166.0 06/06/2017   GLUCOSE 122 (H) 06/06/2017   CHOL 139 06/06/2017   TRIG 115.0 06/06/2017   HDL 39.30 06/06/2017   LDLDIRECT 87.6 09/19/2008   LDLCALC 77 06/06/2017   ALT 17 06/06/2017   AST 20 06/06/2017   NA 141 06/06/2017   K 4.1 06/06/2017   CL 103 06/06/2017   CREATININE 1.20 06/06/2017   BUN 15 06/06/2017   CO2 28 06/06/2017   TSH 4.71 (H) 06/06/2017   PSA 1.47 11/20/2013   INR 1.01 09/05/2012   HGBA1C 6.5 06/06/2017   MICROALBUR 0.7 05/24/2016   ECG today I have personaly interpreted NSR, first deg AVB, occ pac    Assessment & Plan:

## 2017-10-21 ENCOUNTER — Telehealth: Payer: Self-pay | Admitting: Internal Medicine

## 2017-10-21 NOTE — Telephone Encounter (Signed)
Son was only checking on referrals that Dr. Jenny Reichmann has placed for patient.  States patient informed son that he had three calls but he could not recall who the calls came from.  Patient is scheduled with [facility] before he comes back to our office.  I have suggested that the patient complete a DPR to go through the [place] while at that visit and to also scan POA into chart if we have not received and scanned POA by then.

## 2017-10-21 NOTE — Telephone Encounter (Signed)
Copied from [place] [id number]. Topic: Quick Communication - See Telephone Encounter >> Oct 21, 2017  1:10 PM Neva Seat wrote: Ryin Ambrosius [phone]  Needing to discuss pt's appts and care.  Please call son Delrae Alfred asap. Pt's son is faxing over Medical POA to office.

## 2017-10-24 ENCOUNTER — Encounter: Payer: Self-pay | Admitting: Neurology

## 2017-10-27 ENCOUNTER — Telehealth: Payer: Self-pay | Admitting: Internal Medicine

## 2017-10-27 ENCOUNTER — Ambulatory Visit
Admission: RE | Admit: 2017-10-27 | Discharge: 2017-10-27 | Disposition: A | Discharge status: Home / Self Care | Payer: Medicare Other | Source: Ambulatory Visit | Attending: Internal Medicine | Admitting: Internal Medicine

## 2017-10-27 DIAGNOSIS — R29818 Other symptoms and signs involving the nervous system: Secondary | ICD-10-CM

## 2017-10-27 NOTE — Telephone Encounter (Signed)
Noted  

## 2017-10-27 NOTE — Telephone Encounter (Signed)
Copied from [place] [id number]. Topic: Quick Communication - See Telephone Encounter >> Oct 27, 2017  2:37 PM Sean Lozano wrote: CRM for notification. See Telephone encounter for: 10/27/17. FYI Son refused Home Health PT today, will restart tomorrow

## 2017-10-28 ENCOUNTER — Telehealth: Payer: Self-pay | Admitting: Internal Medicine

## 2017-10-28 NOTE — Telephone Encounter (Signed)
Copied from [place] [id number]. Topic: Quick Communication - See Telephone Encounter >> Oct 28, 2017 12:51 PM Percell Belt A wrote: CRM for notification. See Telephone encounter for: 10/28/17.  Cecille Rubin with [place] home health [phone] Need verbals Med [place],  Nursing  1 week 3

## 2017-10-28 NOTE — Telephone Encounter (Signed)
Verbal orders given to Lori 

## 2017-10-28 NOTE — Telephone Encounter (Signed)
Verbal orders given to Don  

## 2017-10-28 NOTE — Telephone Encounter (Signed)
Copied from [place] [id number]. Topic: General - Other >> Oct 28, 2017  3:35 PM Keene Breath wrote: Reason for CRM: Timmothy Sours with [facility] called for orders - 1 x wk for 4 wks, for ADL transfers, Balance IADL exercise, DC when goals are met or maximum potential.  Speech Therapy evaluation due to difficult word finding.   CB# [phone].

## 2017-11-01 ENCOUNTER — Telehealth: Payer: Self-pay | Admitting: Internal Medicine

## 2017-11-01 DIAGNOSIS — I639 Cerebral infarction, unspecified: Secondary | ICD-10-CM

## 2017-11-01 DIAGNOSIS — I69398 Other sequelae of cerebral infarction: Secondary | ICD-10-CM

## 2017-11-01 DIAGNOSIS — R531 Weakness: Secondary | ICD-10-CM

## 2017-11-01 DIAGNOSIS — R29818 Other symptoms and signs involving the nervous system: Secondary | ICD-10-CM

## 2017-11-01 NOTE — Telephone Encounter (Signed)
MD is out of the office pls advise../lmb 

## 2017-11-01 NOTE — Telephone Encounter (Signed)
Copied from [place] [id number]. Topic: Quick Communication - See Telephone Encounter >> Nov 01, 2017 12:42 PM Antonieta Iba C wrote: CRM for notification. See Telephone encounter for: 11/01/17.   Free - PT w/ [facility] is requesting Verbal orders for PT - [phone]   1 week 1   2 week 3   1 week 1   PT is requesting a order for a 4 wheel walker with brakes (DME) he said that they dont have a DME area with [facility].

## 2017-11-02 NOTE — Telephone Encounter (Signed)
Called PT back no answer LMOM w/MD response. Also LMOM to RTC back concerning order for walker. Needing to know where rx need to be sent.Marland KitchenJohny Chess

## 2017-11-02 NOTE — Addendum Note (Signed)
Addended by: Earnstine Regal on: 11/02/2017 04:01 PM   Modules accepted: Orders

## 2017-11-02 NOTE — Telephone Encounter (Signed)
Sree called back and stated the order needed to [facility]ed to [facility]. printed dme [facility]ed to [facility].Marland KitchenJohny Chess

## 2017-11-02 NOTE — Telephone Encounter (Signed)
Ok Thx 

## 2017-11-04 ENCOUNTER — Telehealth: Payer: Self-pay | Admitting: Internal Medicine

## 2017-11-04 NOTE — Telephone Encounter (Signed)
Copied from [place] [id number]. Topic: General - Other >> Nov 04, 2017 10:53 AM Carolyn Stare wrote:  Dava with [place] cal lto say pt slipped out of bed at 3:30 am vitals good no complaint of pain  recommend  bed rail and family will purchase  .they will notify son

## 2017-11-04 NOTE — Telephone Encounter (Signed)
Copied from Kevin [id number]. Topic: Quick Communication - See Telephone Encounter >> Nov 04, 2017  2:37 PM Mylinda Latina, NT wrote: CRM for notification. See Telephone encounter for: 11/04/17. Reed (PT) calling from [facility] is calling and stating that the patient had a fall yesterday and today. He states he has a small bruise on his left elbow. Patient is ok now. He just wanted to make the provider aware. No call back needed he stated.

## 2017-11-04 NOTE — Telephone Encounter (Signed)
FYI../lmb 

## 2017-11-07 DIAGNOSIS — E119 Type 2 diabetes mellitus without complications: Secondary | ICD-10-CM

## 2017-11-07 DIAGNOSIS — J45909 Unspecified asthma, uncomplicated: Secondary | ICD-10-CM

## 2017-11-07 DIAGNOSIS — H9190 Unspecified hearing loss, unspecified ear: Secondary | ICD-10-CM

## 2017-11-07 DIAGNOSIS — Z9181 History of falling: Secondary | ICD-10-CM

## 2017-11-07 DIAGNOSIS — M6281 Muscle weakness (generalized): Secondary | ICD-10-CM | POA: Diagnosis not present

## 2017-11-07 DIAGNOSIS — F329 Major depressive disorder, single episode, unspecified: Secondary | ICD-10-CM

## 2017-11-07 DIAGNOSIS — I2510 Atherosclerotic heart disease of native coronary artery without angina pectoris: Secondary | ICD-10-CM

## 2017-11-07 DIAGNOSIS — I440 Atrioventricular block, first degree: Secondary | ICD-10-CM

## 2017-11-07 DIAGNOSIS — Z7901 Long term (current) use of anticoagulants: Secondary | ICD-10-CM

## 2017-11-07 DIAGNOSIS — Z87891 Personal history of nicotine dependence: Secondary | ICD-10-CM

## 2017-11-07 DIAGNOSIS — I6930 Unspecified sequelae of cerebral infarction: Secondary | ICD-10-CM

## 2017-11-07 NOTE — Telephone Encounter (Signed)
FYI

## 2017-11-18 ENCOUNTER — Telehealth: Payer: Self-pay | Admitting: Internal Medicine

## 2017-11-18 NOTE — Telephone Encounter (Signed)
Notified Don w/MD response.../lmb 

## 2017-11-18 NOTE — Telephone Encounter (Signed)
Ok for verbal 

## 2017-11-18 NOTE — Telephone Encounter (Signed)
Copied from [place] [id number]. Topic: General - Other >> Nov 18, 2017  9:00 AM Yvette Rack wrote: Reason for CRM: Tommi Emery OT [phone] calling for verbal order to reschedule pt last visit which was today for next week pt is moving to a living facility today and request to have appt next week may leave message on voice mail

## 2017-11-24 NOTE — Progress Notes (Signed)
NEUROLOGY CONSULTATION NOTE  DERREL MOORE MRN: [id number] DOB: June 30, 1933  Referring provider: Cathlean Cower, MD Primary care provider: Cathlean Cower, MD  Reason for consult:  stroke  HISTORY OF PRESENT ILLNESS: Sean Lozano is a an 84 year old right-handed male with CAD s/p CABG/NSTEMI, diabetes mellitus, depression, carotid artery disease and hyperlipidemia who presents for stroke.  He is accompanied by his son who supplements history.  History also supplemented by referring provider's note.  In 2014, he began experiencing unexplained falls, one of them causing a concussion.  Carotid doppler from May 2014 demonstrated severe left ICA stenosis of 80-99%.  He underwent left carotid endarterectomy that month.   Since this past Christmas, he has had several more falls.  Since he stopped Benadryl, tramadol and muscle relaxers, he has not had further falls.  In June, he had an episode of slurred speech, word-finding difficulty, difficulty operating the TV remote, using the phone.  Confusion has cleared but he continues to have word-finding and difficulty using the remote.  He has had trouble with managing medications.  He drives minimally.  He denies disorientation on familiar routes.  In hindsight, his son thinks the confusion has been more gradual.  He and his wife has since moved in to a group retirement home.  Since June, his son handles his bills out of precaution but he has been able to handle finances himself.  Most bills are on autopay.  He cares for his wife who has dementia.  He was already on ASA, Plavix and statin therapy.  He saw his PCP two weeks later. EKG looked okay.  MRI of brain from 10/27/17 was personally reviewed and demonstrated moderate to severe chronic small vessel ischemic changes but no acute or subacute infarct, bleed, or mass lesion.  Lipid panel from 10/20/17 showed total cholesterol 130, TG 129, HDL 36.80, and LDL 67.  Hgb A1c was 6.5.  Last carotid doppler available from  10/19/16 showed no hemodynamically significant stenosis.    He takes ASA 81mg  daily, Plavix 75mg  daily, pravastatin 40mg , metoprolol, amitriptyline 50mg  at bedtime as needed for sleep.  His mother had Alzheimer's disease.  She passed at 57.  His father died at 73 from aortic dissection.    PAST MEDICAL HISTORY: Past Medical History:  Diagnosis Date  . 1st degree AV block   . Arthritis   . Asthma   . BARRETTS ESOPHAGUS 06/02/2007  . Benign prostatic hypertrophy   . Bladder neck obstruction 09/29/2010  . CAD (coronary artery disease)    a. CABG x 6 in 1992. b. s/p DES x 2 to SVG -> OM1/OM2 in 09/2010. c. s/p DES to mid LAD 10/2010. d. NSTEMI (off DAPT) s/p PTCA of instent renarrowing of DES in SVG-OM, Recs for lifelong DAPT   . Carotid artery disease ([facility])    Doppler, May, 2014, 3-00% R. ICA, 76-22% LICA, with recent syncope patient sent for consult with vascular surgery  . Carotid bruit    April, 2014  . Cervical disc disease   . Concussion July 09, 2012   Scalp  . Depression   . Diabetes mellitus    type II-diet  . Diverticulosis   . Ejection fraction    EF 60%, echo, June, 2012  . Fall at home July 09, 2012  . GERD (gastroesophageal reflux disease)   . Hearing loss 2014  . Hx of CABG    1992  . Hyperlipidemia    intolerance to Crestor Zocor Vytorin. He tolerates  Pravachol...low HDL  . Paresthesias    successful surgery by Dr. Earnie Larsson  . Prostatitis   . Rosacea   . Sinus bradycardia    asymptomatic   . SKIN LESION 06/02/2007  . Subdural hematoma ([facility])    Subdural hematoma secondary to falling off a ladder  April, 2014,  medical therapy,  subdural did not expand while blood levels of ddual antiplatelet therapy decreased over one week.  Episode of chest pain in the hospital,, dual antiplatelet therapy restarted approximately one week after it was held  . Syncope    The patient lost consciousness and hit his head July 09 2012, not clear yet if he had true syncope     PAST SURGICAL HISTORY: Past Surgical History:  Procedure Laterality Date  . APPENDECTOMY    . Barretts Esophagus   20/20/09  . Bladder neck obstruction  09/29/10  . C-spine disc surgery  03/2006  . C4-5 surgery  01/2009  . CARDIAC CATHETERIZATION  July 2012   stent placed  X's  4  . COCCYX FRACTURE SURGERY  1997  . CORONARY ARTERY BYPASS GRAFT  1992  . ENDARTERECTOMY Left 09/07/2012   Procedure: ENDARTERECTOMY CAROTID with patch angioplasty;  Surgeon: Mal Misty, MD;  Location: [facility];  Service: Vascular;  Laterality: Left;  . LEFT HEART CATHETERIZATION WITH CORONARY/GRAFT ANGIOGRAM N/A 12/21/2011   Procedure: LEFT HEART CATHETERIZATION WITH Beatrix Fetters;  Surgeon: Hillary Bow, MD;  Location: [facility] CATH LAB;  Service: Cardiovascular;  Laterality: N/A;  . TONSILLECTOMY      MEDICATIONS: Current Outpatient Medications on File Prior to Visit  Medication Sig Dispense Refill  . amitriptyline (ELAVIL) 50 MG tablet TAKE 1 TABLET (50 MG TOTAL) BY MOUTH AT BEDTIME AS NEEDED FOR SLEEP. 90 tablet 1  . aspirin (ASPIRIN EC) 81 MG EC tablet Take 81 mg by mouth daily.      . Cholecalciferol (VITAMIN D3) 5000 units CAPS Take 1 tablet by mouth daily.     . clopidogrel (PLAVIX) 75 MG tablet TAKE 1 TABLET BY MOUTH EVERY DAY 90 tablet 3  . diphenhydrAMINE (BENADRYL) 25 MG tablet Take 25 mg by mouth daily as needed for allergies.     . metoprolol succinate (TOPROL-XL) 25 MG 24 hr tablet Take 0.5 tablets (12.5 mg total) by mouth 2 (two) times daily. 90 tablet 3  . mometasone-formoterol (DULERA) 100-5 MCG/ACT AERO Inhale 2 puffs into the lungs 2 (two) times daily. Prn, to last 3 months 1 Inhaler 3  . montelukast (SINGULAIR) 10 MG tablet TAKE 1 TABLET (10 MG TOTAL) BY MOUTH DAILY. 90 tablet 3  . nitroGLYCERIN (NITROSTAT) 0.4 MG SL tablet Place 1 tablet (0.4 mg total) under the tongue every 5 (five) minutes as needed for chest pain. 75 tablet 2  . pantoprazole (PROTONIX) 40 MG tablet TAKE 1  TABLET BY MOUTH EVERY DAY 90 tablet 3  . pravastatin (PRAVACHOL) 40 MG tablet TAKE 1 TABLET BY MOUTH EVERY EVENING (FILLABLE 04/03/16) 90 tablet 3  . tamsulosin (FLOMAX) 0.4 MG CAPS capsule TAKE 1 CAPSULE (0.4 MG TOTAL) BY MOUTH DAILY AFTER SUPPER. 90 capsule 2   No current facility-administered medications on file prior to visit.     ALLERGIES: Allergies  Allergen Reactions  . Atenolol Other (See Comments)    depression  . Rosuvastatin Other (See Comments)    Severe hip pain  . Simvastatin Other (See Comments)    Severe hip pain  . Ultram [Tramadol] Nausea And Vomiting  . Zofran Alvis Lemmings  Hcl] Nausea Only and Other (See Comments)    Severe nausea and constipation  . Adhesive [Tape] Other (See Comments)    Breakdown skin  . Codeine Other (See Comments)    Crazy feeling  . Ezetimibe-Simvastatin Other (See Comments)    Severe hip pain    FAMILY HISTORY: Family History  Problem Relation Age of Onset  . Heart disease Father        mother  . Heart attack Father   . Diabetes Sister        mother  . Kidney disease Sister   . Hypertension Son   . Hyperlipidemia Son   . Hypertension Son   . Diabetes Mother   . Heart disease Mother   . Aneurysm Unknown   . Hyperlipidemia Unknown   . Liver cancer Unknown        grandfather    SOCIAL HISTORY: Social History   Socioeconomic History  . Marital status: Married    Spouse name: Not on file  . Number of children: 3  . Years of education: Not on file  . Highest education level: Not on file  Occupational History  . Occupation: retired-real estate  Social Needs  . Financial resource strain: Not hard at all  . Food insecurity:    Worry: Never true    Inability: Never true  . Transportation needs:    Medical: No    Non-medical: No  Tobacco Use  . Smoking status: Former Smoker    Types: Cigarettes    Start date: 01/03/1955  . Smokeless tobacco: Never Used  Substance and Sexual Activity  . Alcohol use: Yes     Comment: occ  . Drug use: No  . Sexual activity: Never  Lifestyle  . Physical activity:    Days per week: 0 days    Minutes per session: 0 min  . Stress: To some extent  Relationships  . Social connections:    Talks on phone: More than three times a week    Gets together: More than three times a week    Attends religious service: More than 4 times per year    Active member of club or organization: Not on file    Attends meetings of clubs or organizations: More than 4 times per year    Relationship status: Married  . Intimate partner violence:    Fear of current or ex partner: No    Emotionally abused: No    Physically abused: No    Forced sexual activity: No  Other Topics Concern  . Not on file  Social History Narrative      Primary caregiver for wife who has dementia.    REVIEW OF SYSTEMS: Constitutional: No fevers, chills, or sweats, no generalized fatigue, change in appetite Eyes: No visual changes, double vision, eye pain Ear, nose and throat: No hearing loss, ear pain, nasal congestion, sore throat Cardiovascular: No chest pain, palpitations Respiratory:  No shortness of breath at rest or with exertion, wheezes GastrointestinaI: No nausea, vomiting, diarrhea, abdominal pain, fecal incontinence Genitourinary:  No dysuria, urinary retention or frequency Musculoskeletal:  No neck pain, back pain Integumentary: No rash, pruritus, skin lesions Neurological: as above Psychiatric: No depression, insomnia, anxiety Endocrine: No palpitations, fatigue, diaphoresis, mood swings, change in appetite, change in weight, increased thirst Hematologic/Lymphatic:  No purpura, petechiae. Allergic/Immunologic: no itchy/runny eyes, nasal congestion, recent allergic reactions, rashes  PHYSICAL EXAM: Blood pressure (!) 94/58, pulse 95, height 5' 11.5" (1.816 m), weight 192 lb (87.1 kg),  SpO2 95 %. General: No acute distress.  Patient appears well-groomed.  Head:   Normocephalic/atraumatic Eyes:  fundi examined but not visualized Neck: supple, no paraspinal tenderness, full range of motion Back: No paraspinal tenderness Heart: regular rate and rhythm Lungs: Clear to auscultation bilaterally. Vascular: No carotid bruits. Neurological Exam: Mental status: alert and oriented to person, place, and time (except month), delayed recall 1 of 3 words, remote memory intact, fund of knowledge intact, attention and concentration intact, speech fluent and not dysarthric, language intact.  Able to complete Trail Making Test but unable to copy cube or draw clock correctly. MMSE - Mini Mental State Exam 11/25/2017 06/08/2017  Orientation to time 4 5  Orientation to Place 5 5  Registration 3 3  Attention/ Calculation 5 4  Recall 1 1  Language- name 2 objects 2 2  Language- repeat 1 1  Language- follow 3 step command 3 3  Language- read & follow direction 1 1  Write a sentence 1 1  Copy design 1 1  Total score 27 27   Cranial nerves: CN I: not tested CN II: pupils equal, round and reactive to light, visual fields intact CN III, IV, VI:  full range of motion, no nystagmus, no ptosis CN V: facial sensation intact CN VII: upper and lower face symmetric CN VIII: hearing intact CN IX, X: gag intact, uvula midline CN XI: sternocleidomastoid and trapezius muscles intact CN XII: tongue midline Bulk & Tone: normal, no fasciculations. Motor:  5/5 throughout  Sensation: temperature and vibration sensation intact. Deep Tendon Reflexes:  absent throughout, toes downgoing.  Finger to nose testing:  Without dysmetria.  Heel to shin:  Without dysmetria.  Gait:  Shuffling gait.  Able to turn. Romberg positive.  IMPRESSION: 1.  Mild cognitive impairment, likely vascular given the acute onset and MRI findings, but cannot rule out neurodegenerative process such as Alzheimer's. 2.  TIA 3.  Hyperlipidemia 4.  Carotid artery disease 5.  CAD  PLAN: 1.  Start Aricept 5mg   daily.  Side effects discussed.  If tolerating, increase to 10mg  at bedtime in 4 weeks. 2.  Check carotid doppler, Echo with bubble study and 30 day Holter 3.  Continue ASA, Plavix, pravastatin, blood pressure management 4.  Check B12 and TSH 5.  Limit driving to short distances to familiar places ([facility], grocery store) and during daylight hours. 6.  Follow up in 4 months.  Thank you for allowing me to take part in the care of this patient.  Metta Clines, DO  CC: Cathlean Cower, MD

## 2017-11-25 ENCOUNTER — Ambulatory Visit: Payer: Medicare Other | Admitting: Neurology

## 2017-11-25 ENCOUNTER — Telehealth: Payer: Self-pay

## 2017-11-25 ENCOUNTER — Other Ambulatory Visit (INDEPENDENT_AMBULATORY_CARE_PROVIDER_SITE_OTHER): Payer: Medicare Other

## 2017-11-25 ENCOUNTER — Encounter: Payer: Self-pay | Admitting: Neurology

## 2017-11-25 VITALS — BP 94/58 | HR 95 | Ht 71.5 in | Wt 192.0 lb

## 2017-11-25 DIAGNOSIS — R6889 Other general symptoms and signs: Secondary | ICD-10-CM

## 2017-11-25 DIAGNOSIS — I679 Cerebrovascular disease, unspecified: Secondary | ICD-10-CM | POA: Diagnosis not present

## 2017-11-25 DIAGNOSIS — I6522 Occlusion and stenosis of left carotid artery: Secondary | ICD-10-CM

## 2017-11-25 DIAGNOSIS — E785 Hyperlipidemia, unspecified: Secondary | ICD-10-CM

## 2017-11-25 DIAGNOSIS — G459 Transient cerebral ischemic attack, unspecified: Secondary | ICD-10-CM

## 2017-11-25 DIAGNOSIS — G3184 Mild cognitive impairment, so stated: Secondary | ICD-10-CM | POA: Diagnosis not present

## 2017-11-25 DIAGNOSIS — I25810 Atherosclerosis of coronary artery bypass graft(s) without angina pectoris: Secondary | ICD-10-CM

## 2017-11-25 LAB — VITAMIN B12: Vitamin B-12: 720 pg/mL (ref 211–911)

## 2017-11-25 LAB — TSH: TSH: 2.46 u[IU]/mL (ref 0.35–4.50)

## 2017-11-25 MED ORDER — DONEPEZIL HCL 5 MG PO TABS: 5.0000 mg | ORAL_TABLET | Freq: Every day | ORAL | 0 refills | Status: DC

## 2017-11-25 NOTE — Telephone Encounter (Signed)
-----   Message from Pieter Partridge, DO sent at 11/25/2017  2:28 PM EDT ----- Labs are normal

## 2017-11-25 NOTE — Patient Instructions (Addendum)
1.  We will start donepezil (Aricept) 5mg  daily for four weeks.  If you are tolerating the medication, then after four weeks, we will increase the dose to 10mg  daily.  Side effects include nausea, vomiting, diarrhea, vivid dreams, and muscle cramps.  Please call the clinic if you experience any of these symptoms. 2.  We will check carotid doppler, transthoracic echocardiogram with bubble study and 3 day Holter monitor. 3.  Limit driving to short distances to familiar places ([facility], grocery store) and during daylight hours 4.  Continue aspirin, plavix, pravastatin and blood pressure medications.  If you feel lightheaded, contact Dr. Jenny Reichmann about your blood pressure. 5.  Check B12 and TSH 6.  Follow up in 4 months.  Your provider has requested that you have labwork completed today. Please go to [facility] ([id number]) on the second floor of this building before leaving the office today. You do not need to check in. If you are not called within 15 minutes please check with the front desk.   We have sent a referral to [facility] for the Carotid ultrasound and they will call you directly to schedule your appt. If you need to contact them directly please call [phone].

## 2017-11-25 NOTE — Telephone Encounter (Signed)
Called and spoke with Sean Lozano, advised him of lab  results

## 2017-12-02 ENCOUNTER — Emergency Department (HOSPITAL_COMMUNITY): Payer: Medicare Other

## 2017-12-02 ENCOUNTER — Encounter (HOSPITAL_COMMUNITY): Payer: Self-pay

## 2017-12-02 ENCOUNTER — Observation Stay (HOSPITAL_COMMUNITY)
Admission: EM | Admit: 2017-12-02 | Discharge: 2017-12-03 | Disposition: A | Discharge status: Home / Self Care | Payer: Medicare Other | Attending: Internal Medicine | Admitting: Internal Medicine

## 2017-12-02 ENCOUNTER — Other Ambulatory Visit: Payer: Self-pay

## 2017-12-02 ENCOUNTER — Emergency Department (HOSPITAL_BASED_OUTPATIENT_CLINIC_OR_DEPARTMENT_OTHER): Payer: Medicare Other

## 2017-12-02 DIAGNOSIS — M79609 Pain in unspecified limb: Secondary | ICD-10-CM

## 2017-12-02 DIAGNOSIS — F0390 Unspecified dementia without behavioral disturbance: Secondary | ICD-10-CM | POA: Diagnosis not present

## 2017-12-02 DIAGNOSIS — I2510 Atherosclerotic heart disease of native coronary artery without angina pectoris: Secondary | ICD-10-CM | POA: Diagnosis not present

## 2017-12-02 DIAGNOSIS — I252 Old myocardial infarction: Secondary | ICD-10-CM | POA: Insufficient documentation

## 2017-12-02 DIAGNOSIS — I10 Essential (primary) hypertension: Secondary | ICD-10-CM

## 2017-12-02 DIAGNOSIS — R0789 Other chest pain: Secondary | ICD-10-CM | POA: Diagnosis present

## 2017-12-02 DIAGNOSIS — R072 Precordial pain: Secondary | ICD-10-CM

## 2017-12-02 DIAGNOSIS — G4700 Insomnia, unspecified: Secondary | ICD-10-CM | POA: Diagnosis not present

## 2017-12-02 DIAGNOSIS — E119 Type 2 diabetes mellitus without complications: Secondary | ICD-10-CM

## 2017-12-02 DIAGNOSIS — W19XXXD Unspecified fall, subsequent encounter: Secondary | ICD-10-CM

## 2017-12-02 DIAGNOSIS — R296 Repeated falls: Secondary | ICD-10-CM | POA: Diagnosis not present

## 2017-12-02 DIAGNOSIS — J45909 Unspecified asthma, uncomplicated: Secondary | ICD-10-CM | POA: Diagnosis not present

## 2017-12-02 DIAGNOSIS — E785 Hyperlipidemia, unspecified: Secondary | ICD-10-CM | POA: Insufficient documentation

## 2017-12-02 DIAGNOSIS — N400 Enlarged prostate without lower urinary tract symptoms: Secondary | ICD-10-CM | POA: Insufficient documentation

## 2017-12-02 DIAGNOSIS — Z7982 Long term (current) use of aspirin: Secondary | ICD-10-CM | POA: Diagnosis not present

## 2017-12-02 DIAGNOSIS — Z79899 Other long term (current) drug therapy: Secondary | ICD-10-CM | POA: Insufficient documentation

## 2017-12-02 DIAGNOSIS — R079 Chest pain, unspecified: Secondary | ICD-10-CM

## 2017-12-02 DIAGNOSIS — R0602 Shortness of breath: Secondary | ICD-10-CM | POA: Insufficient documentation

## 2017-12-02 DIAGNOSIS — I779 Disorder of arteries and arterioles, unspecified: Secondary | ICD-10-CM | POA: Diagnosis present

## 2017-12-02 DIAGNOSIS — Z87891 Personal history of nicotine dependence: Secondary | ICD-10-CM | POA: Diagnosis not present

## 2017-12-02 DIAGNOSIS — K219 Gastro-esophageal reflux disease without esophagitis: Secondary | ICD-10-CM | POA: Diagnosis not present

## 2017-12-02 DIAGNOSIS — K2270 Barrett's esophagus without dysplasia: Secondary | ICD-10-CM | POA: Diagnosis present

## 2017-12-02 DIAGNOSIS — I25810 Atherosclerosis of coronary artery bypass graft(s) without angina pectoris: Secondary | ICD-10-CM | POA: Diagnosis not present

## 2017-12-02 DIAGNOSIS — M7989 Other specified soft tissue disorders: Secondary | ICD-10-CM

## 2017-12-02 DIAGNOSIS — R1013 Epigastric pain: Secondary | ICD-10-CM

## 2017-12-02 DIAGNOSIS — Z955 Presence of coronary angioplasty implant and graft: Secondary | ICD-10-CM | POA: Diagnosis not present

## 2017-12-02 DIAGNOSIS — Z951 Presence of aortocoronary bypass graft: Secondary | ICD-10-CM | POA: Diagnosis not present

## 2017-12-02 DIAGNOSIS — W19XXXA Unspecified fall, initial encounter: Secondary | ICD-10-CM | POA: Insufficient documentation

## 2017-12-02 DIAGNOSIS — I25709 Atherosclerosis of coronary artery bypass graft(s), unspecified, with unspecified angina pectoris: Secondary | ICD-10-CM | POA: Diagnosis not present

## 2017-12-02 DIAGNOSIS — I739 Peripheral vascular disease, unspecified: Secondary | ICD-10-CM

## 2017-12-02 LAB — CBC
HCT: 45.3 % (ref 39.0–52.0)
Hemoglobin: 15 g/dL (ref 13.0–17.0)
MCH: 33.3 pg (ref 26.0–34.0)
MCHC: 33.1 g/dL (ref 30.0–36.0)
MCV: 100.7 fL — ABNORMAL HIGH (ref 78.0–100.0)
Platelets: 188 10*3/uL (ref 150–400)
RBC: 4.5 MIL/uL (ref 4.22–5.81)
RDW: 12.6 % (ref 11.5–15.5)
WBC: 10.7 10*3/uL — ABNORMAL HIGH (ref 4.0–10.5)

## 2017-12-02 LAB — CREATININE, SERUM
Creatinine, Ser: 1.14 mg/dL (ref 0.61–1.24)
GFR calc Af Amer: >60 mL/min (ref >60)
GFR calc non Af Amer: 58 mL/min — ABNORMAL LOW (ref >60)

## 2017-12-02 LAB — COMPREHENSIVE METABOLIC PANEL
ALT: 24 U/L (ref 0–44)
AST: 26 U/L (ref 15–41)
Albumin: 3.7 g/dL (ref 3.5–5.0)
Alkaline Phosphatase: 72 U/L (ref 38–126)
Anion gap: 6 (ref 5–15)
BUN: 12 mg/dL (ref 8–23)
CO2: 28 mmol/L (ref 22–32)
Calcium: 9.5 mg/dL (ref 8.9–10.3)
Chloride: 104 mmol/L (ref 98–111)
Creatinine, Ser: 1.13 mg/dL (ref 0.61–1.24)
GFR calc Af Amer: >60 mL/min (ref >60)
GFR calc non Af Amer: 59 mL/min — ABNORMAL LOW (ref >60)
Glucose, Bld: 86 mg/dL (ref 70–99)
Potassium: 3.9 mmol/L (ref 3.5–5.1)
Sodium: 138 mmol/L (ref 135–145)
Total Bilirubin: 0.8 mg/dL (ref 0.3–1.2)
Total Protein: 6.5 g/dL (ref 6.5–8.1)

## 2017-12-02 LAB — PROTIME-INR
INR: 1.04
Prothrombin Time: 13.5 seconds (ref 11.4–15.2)

## 2017-12-02 LAB — BRAIN NATRIURETIC PEPTIDE: B Natriuretic Peptide: 47.5 pg/mL (ref 0.0–100.0)

## 2017-12-02 LAB — URINALYSIS, ROUTINE W REFLEX MICROSCOPIC
Bacteria, UA: NONE SEEN
Bilirubin Urine: NEGATIVE
Glucose, UA: NEGATIVE mg/dL
Hgb urine dipstick: NEGATIVE
Ketones, ur: NEGATIVE mg/dL
Leukocytes, UA: NEGATIVE
Nitrite: NEGATIVE
Protein, ur: NEGATIVE mg/dL
Specific Gravity, Urine: 1.006 (ref 1.005–1.030)
pH: 7 (ref 5.0–8.0)

## 2017-12-02 LAB — CBC WITH DIFFERENTIAL/PLATELET
Abs Immature Granulocytes: 0 10*3/uL (ref 0.0–0.1)
Basophils Absolute: 0.1 10*3/uL (ref 0.0–0.1)
Basophils Relative: 1 %
Eosinophils Absolute: 0.3 10*3/uL (ref 0.0–0.7)
Eosinophils Relative: 3 %
HCT: 47.7 % (ref 39.0–52.0)
Hemoglobin: 15.8 g/dL (ref 13.0–17.0)
Immature Granulocytes: 0 %
Lymphocytes Relative: 44 %
Lymphs Abs: 4.3 10*3/uL — ABNORMAL HIGH (ref 0.7–4.0)
MCH: 33.7 pg (ref 26.0–34.0)
MCHC: 33.1 g/dL (ref 30.0–36.0)
MCV: 101.7 fL — ABNORMAL HIGH (ref 78.0–100.0)
Monocytes Absolute: 0.9 10*3/uL (ref 0.1–1.0)
Monocytes Relative: 9 %
Neutro Abs: 4.2 10*3/uL (ref 1.7–7.7)
Neutrophils Relative %: 43 %
Platelets: 191 10*3/uL (ref 150–400)
RBC: 4.69 MIL/uL (ref 4.22–5.81)
RDW: 13 % (ref 11.5–15.5)
WBC: 9.8 10*3/uL (ref 4.0–10.5)

## 2017-12-02 LAB — TROPONIN I: Troponin I: <0.03 ng/mL (ref <0.03)

## 2017-12-02 LAB — TSH: TSH: 1.947 u[IU]/mL (ref 0.350–4.500)

## 2017-12-02 LAB — I-STAT TROPONIN, ED
Troponin i, poc: 0.01 ng/mL (ref 0.00–0.08)
Troponin i, poc: 0.01 ng/mL (ref 0.00–0.08)

## 2017-12-02 LAB — LIPASE, BLOOD: Lipase: 50 U/L (ref 11–51)

## 2017-12-02 LAB — MAGNESIUM: Magnesium: 2.2 mg/dL (ref 1.7–2.4)

## 2017-12-02 MED ORDER — SODIUM CHLORIDE 0.9 % IV SOLN: 250.0000 mL | INTRAVENOUS | Status: DC | PRN

## 2017-12-02 MED ORDER — DONEPEZIL HCL 5 MG PO TABS
5.0000 mg | ORAL_TABLET | Freq: Every day | ORAL | Status: DC
  Administered 2017-12-02: 5 mg via ORAL
  Filled 2017-12-02: qty 1

## 2017-12-02 MED ORDER — ENOXAPARIN SODIUM 40 MG/0.4ML ~~LOC~~ SOLN
40.0000 mg | SUBCUTANEOUS | Status: DC
  Administered 2017-12-02: 40 mg via SUBCUTANEOUS
  Filled 2017-12-02: qty 0.4

## 2017-12-02 MED ORDER — TAMSULOSIN HCL 0.4 MG PO CAPS
0.4000 mg | ORAL_CAPSULE | Freq: Every day | ORAL | Status: DC
  Administered 2017-12-02: 0.4 mg via ORAL
  Filled 2017-12-02: qty 1

## 2017-12-02 MED ORDER — PRAVASTATIN SODIUM 40 MG PO TABS
40.0000 mg | ORAL_TABLET | Freq: Every day | ORAL | Status: DC
  Administered 2017-12-02: 40 mg via ORAL
  Filled 2017-12-02: qty 1

## 2017-12-02 MED ORDER — ASPIRIN EC 81 MG PO TBEC
81.0000 mg | DELAYED_RELEASE_TABLET | Freq: Every day | ORAL | Status: DC
  Administered 2017-12-02: 81 mg via ORAL
  Filled 2017-12-02: qty 1

## 2017-12-02 MED ORDER — AMITRIPTYLINE HCL 50 MG PO TABS
50.0000 mg | ORAL_TABLET | Freq: Every day | ORAL | Status: DC
  Administered 2017-12-02: 50 mg via ORAL
  Filled 2017-12-02: qty 1

## 2017-12-02 MED ORDER — METOPROLOL SUCCINATE ER 25 MG PO TB24
12.5000 mg | ORAL_TABLET | Freq: Two times a day (BID) | ORAL | Status: DC
  Administered 2017-12-02 – 2017-12-03 (×2): 12.5 mg via ORAL
  Filled 2017-12-02 (×2): qty 1

## 2017-12-02 MED ORDER — SODIUM CHLORIDE 0.9% FLUSH
3.0000 mL | Freq: Two times a day (BID) | INTRAVENOUS | Status: DC
  Administered 2017-12-02: 3 mL via INTRAVENOUS

## 2017-12-02 MED ORDER — PANTOPRAZOLE SODIUM 40 MG PO TBEC
40.0000 mg | DELAYED_RELEASE_TABLET | Freq: Every day | ORAL | Status: DC
  Administered 2017-12-02: 40 mg via ORAL
  Filled 2017-12-02: qty 1

## 2017-12-02 MED ORDER — SODIUM CHLORIDE 0.9% FLUSH: 3.0000 mL | INTRAVENOUS | Status: DC | PRN

## 2017-12-02 NOTE — Consult Note (Signed)
Cardiology Consult    Patient ID: MATTHAN SLEDGE MRN: [id number], DOB/AGE: 1934-02-22   Admit date: 12/02/2017 Date of Consult: 12/02/2017  Primary Physician: Biagio Borg, MD Primary Cardiologist: Sherren Mocha, MD Requesting Provider: C. Tegeler, MD  Patient Profile    Sean Lozano is a 84 y.o. male with a history of CAD s/p remote CABG with subsequent percutaneous interventions, hyperlipidemia, type 2 diabetes mellitus, falls with subdural hematoma in April 2014, syncope, asthma, arthritis, BPH, mild dementia and carotid arterial disease status post carotid endarterectomy 2014, who is being seen today for the evaluation of abdominal discomfort at the request of Dr. Sherry Ruffing.  Past Medical History   Past Medical History:  Diagnosis Date  . 1st degree AV block   . Arthritis   . Asthma   . BARRETTS ESOPHAGUS 06/02/2007  . Benign prostatic hypertrophy   . Bladder neck obstruction 09/29/2010  . CAD (coronary artery disease)    a. CABG x 6 in 1992. b. s/p DES x 2 to SVG -> OM1/OM2 in 09/2010. c. s/p DES to mLAD 10/2010. d. 12/2011 NSTEMI (off DAPT) s/p PTCA of ISR of DES in SVG-OM, Recs for lifelong DAPT   . Carotid artery disease ([place])    Doppler, May, 2014, 7-82% R. ICA, 95-62% LICA, with recent syncope patient sent for consult with vascular surgery  . Carotid bruit    April, 2014  . Cervical disc disease   . Concussion July 09, 2012   Scalp  . Depression   . Diabetes mellitus    type II-diet  . Diverticulosis   . Ejection fraction    EF 60%, echo, June, 2012  . Fall at home July 09, 2012  . GERD (gastroesophageal reflux disease)   . Hearing loss 2014  . Hx of CABG    1992  . Hyperlipidemia    intolerance to Crestor Zocor Vytorin. He tolerates Pravachol...low HDL  . Paresthesias    successful surgery by Dr. Earnie Larsson  . Prostatitis   . Rosacea   . Sinus bradycardia    asymptomatic   . SKIN LESION 06/02/2007  . Subdural hematoma ([facility])    Subdural hematoma  secondary to falling off a ladder  April, 2014,  medical therapy,  subdural did not expand while blood levels of ddual antiplatelet therapy decreased over one week.  Episode of chest pain in the hospital,, dual antiplatelet therapy restarted approximately one week after it was held  . Syncope    The patient lost consciousness and hit his head July 09 2012, not clear yet if he had true syncope    Past Surgical History:  Procedure Laterality Date  . APPENDECTOMY    . Barretts Esophagus   20/20/09  . Bladder neck obstruction  09/29/10  . C-spine disc surgery  03/2006  . C4-5 surgery  01/2009  . CARDIAC CATHETERIZATION  July 2012   stent placed  X's  4  . COCCYX FRACTURE SURGERY  1997  . CORONARY ARTERY BYPASS GRAFT  1992  . ENDARTERECTOMY Left 09/07/2012   Procedure: ENDARTERECTOMY CAROTID with patch angioplasty;  Surgeon: Mal Misty, MD;  Location: [facility];  Service: Vascular;  Laterality: Left;  . LEFT HEART CATHETERIZATION WITH CORONARY/GRAFT ANGIOGRAM N/A 12/21/2011   Procedure: LEFT HEART CATHETERIZATION WITH Beatrix Fetters;  Surgeon: Hillary Bow, MD;  Location: [facility] CATH LAB;  Service: Cardiovascular;  Laterality: N/A;  . TONSILLECTOMY       Allergies  Allergies  Allergen Reactions  .  Atenolol Other (See Comments)    depression  . Rosuvastatin Other (See Comments)    Severe hip pain  . Simvastatin Other (See Comments)    Severe hip pain  . Ultram [Tramadol] Nausea And Vomiting  . Zofran [Ondansetron Hcl] Nausea Only and Other (See Comments)    Severe nausea and constipation  . Adhesive [Tape] Other (See Comments)    Breakdown skin  . Codeine Other (See Comments)    Crazy feeling  . Ezetimibe-Simvastatin Other (See Comments)    Severe hip pain    History of Present Illness    84 year old male with the above complex past medical history including coronary artery disease, hyperlipidemia, type 2 diabetes mellitus, falls, syncope, asthma, arthritis, BPH, mild  dementia, and carotid arterial disease status post carotid endarterectomy.  Cardiac history dates back to 1992, when he underwent CABG x6.  He subsequently required drug-eluting stent placement x3 to the vein graft to the OM1/OM 2 in June 2012.  He had significant LAD disease at that time and underwent staged PCI in July 2012 with drug-eluting stent placement to the LAD via the LIMA.  His last catheterization was performed in September 2013 in the setting of non-STEMI off of dual antiplatelet therapy and he required PTCA within the vein graft to the obtuse marginal in the setting of in-stent restenosis.  He was last seen in cardiology clinic in January 2019, at which time he was doing well from a cardiac standpoint.  He now lives with his wife in an assisted living facility.    Patient says that over the past few months, he has fallen multiple times, striking his head on the floor.  He typically falls from a standing position though on one occasion, he says he fell out of his recliner.  He says that these falls have not been mechanical in nature and he is not sure why his feet just come out from underneath him.  He initially indicated that he is alert during the act of falling but on further questioning, he says that it happened so suddenly, he is not sure if he loses consciousness or not.  Upon reading outpatient notes, he has actually been experiencing unpleasant explained falls since about 2014.  In June of this year, he had an episode of slurred speech, word-finding difficulty, difficulty operating the TV remote, and using the phone.  This subsequently cleared but was followed by a period of approximately 5 falls in for 5 days.  He was seen by his primary care and there was concern for possible subacute stroke.  MRI of the brain on July 18 showed moderate to severe chronic small vessel ischemic changes but no acute or subacute infarcts.  He was subsequently referred to neurology, whom he saw on August 16.  He  is pending echocardiogram, carotid ultrasound, and event monitoring as an outpatient.  His son became involved and upon reviewing the patient's medications, he noticed that he had been taking an old muscle relaxant prescription as well as a prescribed narcotic.  Since these have been discontinued, he has not had any recurrent falls.  This afternoon, he was at home and laying in his recliner when he had severe left upper quadrant discomfort which he says was both sharp and dull in nature.  There were no associated symptoms.  He does not think that his abdomen was tender.  After about an hour of ongoing symptoms, his wife returned from lunch and he alerted her.  He thought symptoms were  reminiscent of prior angina.  EMS was called and after receiving aspirin, symptoms resolved.  He thinks total duration of symptoms were about an hour and a half.  He is currently symptom-free.  Here, ECG is nonacute.  Troponins are normal x2.  Lab work is otherwise unremarkable.  On telemetry, he does have frequent PVCs.  He does have a baseline wide first-degree AV block.  Home Medications    Prior to Admission medications   Medication Sig Start Date End Date Taking? Authorizing Provider  amitriptyline (ELAVIL) 50 MG tablet TAKE 1 TABLET (50 MG TOTAL) BY MOUTH AT BEDTIME AS NEEDED FOR SLEEP. Patient taking differently: Take 50 mg by mouth at bedtime.  06/07/17  Yes Biagio Borg, MD  aspirin (ASPIRIN EC) 81 MG EC tablet Take 81 mg by mouth at bedtime.    Yes [provider]  Calcium Carb-Cholecalciferol (CALCIUM 600+D3 PO) Take 1 tablet by mouth 2 (two) times daily.   Yes [provider]  clopidogrel (PLAVIX) 75 MG tablet TAKE 1 TABLET BY MOUTH EVERY DAY Patient taking differently: Take 75 mg by mouth at bedtime.  05/25/17  Yes Sherren Mocha, MD  donepezil (ARICEPT) 5 MG tablet Take 1 tablet (5 mg total) by mouth at bedtime. 11/25/17  Yes Jaffe, Adam R, DO  metoprolol succinate (TOPROL-XL) 25 MG 24 hr  tablet Take 0.5 tablets (12.5 mg total) by mouth 2 (two) times daily. 05/16/17  Yes Sherren Mocha, MD  nitroGLYCERIN (NITROSTAT) 0.4 MG SL tablet Place 1 tablet (0.4 mg total) under the tongue every 5 (five) minutes as needed for chest pain. 04/29/17  Yes Sherren Mocha, MD  OVER THE COUNTER MEDICATION Take 2 tablets by mouth 2 (two) times daily. Focus factor brain health memory dietary supplement   Yes [provider]  pantoprazole (PROTONIX) 40 MG tablet TAKE 1 TABLET BY MOUTH EVERY DAY Patient taking differently: Take 40 mg by mouth at bedtime.  05/25/17  Yes Sherren Mocha, MD  polycarbophil (FIBERCON) 625 MG tablet Take 625 mg by mouth 2 (two) times daily.   Yes [provider]  pravastatin (PRAVACHOL) 40 MG tablet TAKE 1 TABLET BY MOUTH EVERY EVENING (FILLABLE 04/03/16) Patient taking differently: Take 40 mg by mouth at bedtime.  05/02/17  Yes Sherren Mocha, MD  tamsulosin (FLOMAX) 0.4 MG CAPS capsule TAKE 1 CAPSULE (0.4 MG TOTAL) BY MOUTH DAILY AFTER SUPPER. Patient taking differently: Take 0.4 mg by mouth at bedtime.  07/25/17  Yes Biagio Borg, MD  mometasone-formoterol [facility]) 100-5 MCG/ACT AERO Inhale 2 puffs into the lungs 2 (two) times daily. Prn, to last 3 months Patient not taking: Reported on 12/02/2017 06/02/16   Biagio Borg, MD  montelukast (SINGULAIR) 10 MG tablet TAKE 1 TABLET (10 MG TOTAL) BY MOUTH DAILY. Patient not taking: Reported on 12/02/2017 06/22/17   Biagio Borg, MD    Family History    Family History  Problem Relation Age of Onset  . Heart disease Father        mother  . Heart attack Father   . Diabetes Sister        mother  . Kidney disease Sister   . Hypertension Son   . Hyperlipidemia Son   . Hypertension Son   . Diabetes Mother   . Heart disease Mother   . Diabetes Son   . Aneurysm Unknown   . Hyperlipidemia Unknown   . Liver cancer Unknown        grandfather   He indicated  that his mother is deceased. He indicated that his  father is deceased. He indicated that his sister is alive. He indicated that his brother is alive. He indicated that two of his three sons are alive. He indicated that the status of his unknown relative is unknown.   Social History    Social History   Socioeconomic History  . Marital status: Married    Spouse name: Remo Lipps  . Number of children: 3  . Years of education: Not on file  . Highest education level: Bachelor's degree (e.g., BA, AB, BS)  Occupational History  . Occupation: retired-real estate  Social Needs  . Financial resource strain: Not hard at all  . Food insecurity:    Worry: Never true    Inability: Never true  . Transportation needs:    Medical: No    Non-medical: No  Tobacco Use  . Smoking status: Former Smoker    Types: Cigarettes    Start date: 01/03/1955  . Smokeless tobacco: Never Used  Substance and Sexual Activity  . Alcohol use: Yes    Comment: occ  . Drug use: No  . Sexual activity: Never  Lifestyle  . Physical activity:    Days per week: 0 days    Minutes per session: 0 min  . Stress: To some extent  Relationships  . Social connections:    Talks on phone: More than three times a week    Gets together: More than three times a week    Attends religious service: More than 4 times per year    Active member of club or organization: Not on file    Attends meetings of clubs or organizations: More than 4 times per year    Relationship status: Married  . Intimate partner violence:    Fear of current or ex partner: No    Emotionally abused: No    Physically abused: No    Forced sexual activity: No  Other Topics Concern  . Not on file  Social History Narrative      Primary caregiver for wife who has dementia.      Patient is right-handed. He and his wife live in [facility]. He was recently discharged from P/T 2 x a week.      Review of Systems    General: Frequent falls.  No chills, fever, night sweats or weight changes.    Cardiovascular:  No chest pain, dyspnea on exertion, edema, orthopnea, palpitations, paroxysmal nocturnal dyspnea.  Frequent falls with possible syncope. Dermatological: No rash, lesions/masses Respiratory: No cough, dyspnea Urologic: No hematuria, dysuria Abdominal:   No nausea, vomiting, diarrhea, bright red blood per rectum, melena, or hematemesis Neurologic: He has had confusion.  No visual changes, wkns, changes in mental status. All other systems reviewed and are otherwise negative except as noted above.  Physical Exam    Blood pressure 132/69, pulse 64, temperature 97.9 F (36.6 C), temperature source Oral, resp. rate 14, height 6' (1.829 m), weight 87 kg, SpO2 94 %.  General: Pleasant, NAD Psych: Normal affect. Neuro: Alert and oriented X 3. Moves all extremities spontaneously. HEENT: Normal  Neck: Supple without bruits or JVD. Lungs:  Resp regular and unlabored, CTA. Heart: RRR no s3, s4, or murmurs. Abdomen: Soft, non-tender, non-distended, BS + x 4.  Extremities: No clubbing, cyanosis or edema. DP/PT/Radials 2+ and equal bilaterally.  Labs    Troponin [facility] of Care Test) Recent Labs    12/02/17 1736  TROPIPOC 0.01  Lab Results  Component Value Date   WBC 9.8 12/02/2017   HGB 15.8 12/02/2017   HCT 47.7 12/02/2017   MCV 101.7 (H) 12/02/2017   PLT 191 12/02/2017    Recent Labs  Lab 12/02/17 1407  NA 138  K 3.9  CL 104  CO2 28  BUN 12  CREATININE 1.13  CALCIUM 9.5  PROT 6.5  BILITOT 0.8  ALKPHOS 72  ALT 24  AST 26  GLUCOSE 86   Lab Results  Component Value Date   CHOL 130 10/20/2017   HDL 36.80 (L) 10/20/2017   LDLCALC 67 10/20/2017   TRIG 129.0 10/20/2017     Radiology Studies    Dg Chest 2 View  Result Date: 12/02/2017 CLINICAL DATA:  Chest pain EXAM: CHEST - 2 VIEW COMPARISON:  12/20/2011 CXR FINDINGS: Top-normal size heart with aortic atherosclerosis. Status post CABG. No pulmonary consolidation or CHF. No effusion or pneumothorax.  ACDF hardware is partially visualized at the base of the neck. Surgical clip projects over the left side of the neck. Osteoarthritis of the glenohumeral joints bilaterally. IMPRESSION: 1. Aortic atherosclerosis. 2. No active cardiopulmonary disease. Electronically Signed   By: Ashley Royalty M.D.   On: 12/02/2017 16:30    ECG & Cardiac Imaging    Regular sinus rhythm, 71, first-degree AV block, left axis deviation.  No acute ST or T changes.  Assessment & Plan    1.  Left upper quadrant pain: Patient had sudden onset of left upper quadrant discomfort without associated symptoms lasting about an hour and a half and apparently resolved after he getting aspirin from EMS.  He thinks symptoms may have been reminiscent of prior angina but on further questioning, says he does not remember.  Despite prolonged symptoms, EKG and troponins are normal thus far.  Recommend ongoing observation and formal rule out.  Provided enzymes remain negative, we can consider outpatient ischemic testing.  2.  Coronary artery disease: Status post remote CABG x6 in 1992 with subsequent interventions to the vein graft-obtuse marginal and also LAD.  He is on lifelong dual antiplatelet therapy in the setting of in-stent restenosis in the vein graft to the obtuse marginal requiring PTCA in 2013.  As above, he developed left upper quadrant discomfort which she describes as both sharp and dull in nature, without associated symptoms, lasting about an hour and a half, and resolving after taking aspirin.  He had some concern that this might of been similar prior angina but he is not sure.  As above, so far enzymes are normal.  Observe and cycle enzymes.  Continue aspirin, Plavix, beta-blocker, statin.  3.  Falls: Patient with multiple frequent falls over the summer.  He has had on several occasions.  Patient is not entirely sure if he remembers falling, which is concerning for possible syncope.  He has been evaluated by neurology with plan  for outpatient echo, monitoring, and carotid ultrasound.  He does have multiple PVCs on telemetry.  Observe telemetry tonight.  Agree with outpatient monitoring and follow-up echo to eval LV function.  As this is already scheduled for next Monday, there may not be a need to do it as an inpatient.  4.  Hyperlipidemia: Continue statin therapy.  5.  Carotid arterial disease: Status post left carotid endarterectomy in 2014.  Pending follow-up carotid ultrasound.  6.  Type 2 diabetes mellitus: A1c 6.5.  Signed, Murray Hodgkins, NP 12/02/2017, 6:21 PM  For questions or updates, please contact   Please consult [email or web address]  for contact info under Cardiology/STEMI.

## 2017-12-02 NOTE — H&P (Addendum)
History and Physical    Sean Lozano YSA:[id number] DOB: July 09, 1933 DOA: 12/02/2017  PCP: Biagio Borg, MD  Patient coming from: assisted living vacility   Chief Complaint: chest pain  HPI: Sean Lozano is a 84 y.o. male with medical history significant for subduralhematoma 2014, cabg 1992, pci with stent 2013, diet-controlled dm, asthma, barrett's esophagus, ca stenosis s/p endarterectomy 2014, and recent possible TIA, who presents with above.  In usual state of health as of late, though stressed with recent move with wife to assisted living facility. While seated in recliner today around noon felt left sided chest pain below the breast. A moderate constant squeezing pain that did not radiate. No associated dyspnea. No cough. Not pleuritic. Nothing rising in chest, no bloating, no sour taste in mouth, not similar to normal gerd symptoms. No recent trauma. Resolved spontaneously 1.5 hours later en route. Did receive asa en route. Was not exertional though did not exert self during time had pain. Does say similar in quality to previous bouts of angina.  Was seen by neuro recently for possible recent tia. Mri neg. Carotid dopplers, tte, and holder have been ordered.  ED Course: asa en route. Labs, dvt eval, cxr  Review of Systems: As per HPI otherwise 10 point review of systems negative.    Past Medical History:  Diagnosis Date  . 1st degree AV block   . Arthritis   . Asthma   . BARRETTS ESOPHAGUS 06/02/2007  . Benign prostatic hypertrophy   . Bladder neck obstruction 09/29/2010  . CAD (coronary artery disease)    a. CABG x 6 in 1992. b. s/p DES x 2 to SVG -> OM1/OM2 in 09/2010. c. s/p DES to mLAD 10/2010. d. 12/2011 NSTEMI (off DAPT) s/p PTCA of ISR of DES in SVG-OM, Recs for lifelong DAPT   . Carotid artery disease ([place])    Doppler, May, 2014, 3-23% R. ICA, 55-73% LICA, with recent syncope patient sent for consult with vascular surgery  . Carotid bruit    April, 2014  . Cervical  disc disease   . Concussion July 09, 2012   Scalp  . Depression   . Diabetes mellitus    type II-diet  . Diverticulosis   . Ejection fraction    EF 60%, echo, June, 2012  . Fall at home July 09, 2012  . GERD (gastroesophageal reflux disease)   . Hearing loss 2014  . Hx of CABG    1992  . Hyperlipidemia    intolerance to Crestor Zocor Vytorin. He tolerates Pravachol...low HDL  . Paresthesias    successful surgery by Dr. Earnie Larsson  . Prostatitis   . Rosacea   . Sinus bradycardia    asymptomatic   . SKIN LESION 06/02/2007  . Subdural hematoma ([facility])    Subdural hematoma secondary to falling off a ladder  April, 2014,  medical therapy,  subdural did not expand while blood levels of ddual antiplatelet therapy decreased over one week.  Episode of chest pain in the hospital,, dual antiplatelet therapy restarted approximately one week after it was held  . Syncope    The patient lost consciousness and hit his head July 09 2012, not clear yet if he had true syncope    Past Surgical History:  Procedure Laterality Date  . APPENDECTOMY    . Barretts Esophagus   20/20/09  . Bladder neck obstruction  09/29/10  . C-spine disc surgery  03/2006  . C4-5 surgery  01/2009  . CARDIAC  CATHETERIZATION  July 2012   stent placed  X's  4  . COCCYX FRACTURE SURGERY  1997  . CORONARY ARTERY BYPASS GRAFT  1992  . ENDARTERECTOMY Left 09/07/2012   Procedure: ENDARTERECTOMY CAROTID with patch angioplasty;  Surgeon: Mal Misty, MD;  Location: [facility];  Service: Vascular;  Laterality: Left;  . LEFT HEART CATHETERIZATION WITH CORONARY/GRAFT ANGIOGRAM N/A 12/21/2011   Procedure: LEFT HEART CATHETERIZATION WITH Beatrix Fetters;  Surgeon: Hillary Bow, MD;  Location: [facility] CATH LAB;  Service: Cardiovascular;  Laterality: N/A;  . TONSILLECTOMY       reports that he has quit smoking. His smoking use included cigarettes. He started smoking about 62 years ago. He has never used smokeless tobacco. He  reports that he drinks alcohol. He reports that he does not use drugs.  Allergies  Allergen Reactions  . Atenolol Other (See Comments)    depression  . Rosuvastatin Other (See Comments)    Severe hip pain  . Simvastatin Other (See Comments)    Severe hip pain  . Ultram [Tramadol] Nausea And Vomiting  . Zofran [Ondansetron Hcl] Nausea Only and Other (See Comments)    Severe nausea and constipation  . Adhesive [Tape] Other (See Comments)    Breakdown skin  . Codeine Other (See Comments)    Crazy feeling  . Ezetimibe-Simvastatin Other (See Comments)    Severe hip pain    Family History  Problem Relation Age of Onset  . Heart disease Father        mother  . Heart attack Father   . Diabetes Sister        mother  . Kidney disease Sister   . Hypertension Son   . Hyperlipidemia Son   . Hypertension Son   . Diabetes Mother   . Heart disease Mother   . Diabetes Son   . Aneurysm Unknown   . Hyperlipidemia Unknown   . Liver cancer Unknown        grandfather    Prior to Admission medications   Medication Sig Start Date End Date Taking? Authorizing Provider  amitriptyline (ELAVIL) 50 MG tablet TAKE 1 TABLET (50 MG TOTAL) BY MOUTH AT BEDTIME AS NEEDED FOR SLEEP. Patient taking differently: Take 50 mg by mouth at bedtime.  06/07/17  Yes Biagio Borg, MD  aspirin (ASPIRIN EC) 81 MG EC tablet Take 81 mg by mouth at bedtime.    Yes [provider]  Calcium Carb-Cholecalciferol (CALCIUM 600+D3 PO) Take 1 tablet by mouth 2 (two) times daily.   Yes [provider]  clopidogrel (PLAVIX) 75 MG tablet TAKE 1 TABLET BY MOUTH EVERY DAY Patient taking differently: Take 75 mg by mouth at bedtime.  05/25/17  Yes Sherren Mocha, MD  donepezil (ARICEPT) 5 MG tablet Take 1 tablet (5 mg total) by mouth at bedtime. 11/25/17  Yes Jaffe, Adam R, DO  metoprolol succinate (TOPROL-XL) 25 MG 24 hr tablet Take 0.5 tablets (12.5 mg total) by mouth 2 (two) times daily. 05/16/17  Yes Sherren Mocha, MD  nitroGLYCERIN (NITROSTAT) 0.4 MG SL tablet Place 1 tablet (0.4 mg total) under the tongue every 5 (five) minutes as needed for chest pain. 04/29/17  Yes Sherren Mocha, MD  OVER THE COUNTER MEDICATION Take 2 tablets by mouth 2 (two) times daily. Focus factor brain health memory dietary supplement   Yes [provider]  pantoprazole (PROTONIX) 40 MG tablet TAKE 1 TABLET BY MOUTH EVERY DAY Patient taking differently: Take 40 mg by mouth  at bedtime.  05/25/17  Yes Sherren Mocha, MD  polycarbophil (FIBERCON) 625 MG tablet Take 625 mg by mouth 2 (two) times daily.   Yes [provider]  pravastatin (PRAVACHOL) 40 MG tablet TAKE 1 TABLET BY MOUTH EVERY EVENING (FILLABLE 04/03/16) Patient taking differently: Take 40 mg by mouth at bedtime.  05/02/17  Yes Sherren Mocha, MD  tamsulosin (FLOMAX) 0.4 MG CAPS capsule TAKE 1 CAPSULE (0.4 MG TOTAL) BY MOUTH DAILY AFTER SUPPER. Patient taking differently: Take 0.4 mg by mouth at bedtime.  07/25/17  Yes Biagio Borg, MD    Physical Exam: Vitals:   12/02/17 1445 12/02/17 1500 12/02/17 1515 12/02/17 1530  BP: 124/88 118/77 138/83 132/69  Pulse: 60 (!) 58 62 64  Resp: 17 16 17 14   Temp:      TempSrc:      SpO2: 95% 94% 94% 94%  Weight:      Height:        Constitutional: No acute distress Head: Atraumatic Eyes: Conjunctiva clear ENM: Moist mucous membranes. Normal dentition.  Neck: Supple Respiratory: Clear to auscultation bilaterally, no wheezing/rales/rhonchi. Normal respiratory effort. No accessory muscle use. . Cardiovascular: Regular rate and rhythm. No murmurs/rubs/gallops. Abdomen: Non-tender, non-distended. No masses. No rebound or guarding. Positive bowel sounds. Musculoskeletal: No joint deformity upper and lower extremities. Normal ROM, no contractures. Normal muscle tone.  Skin: No rashes, lesions, or ulcers.  Extremities: No peripheral edema. Palpable peripheral pulses. Neurologic: Alert, moving all 4  extremities. Psychiatric: Normal insight and judgement.   Labs on Admission: I have personally reviewed following labs and imaging studies  CBC: Recent Labs  Lab 12/02/17 1407  WBC 9.8  NEUTROABS 4.2  HGB 15.8  HCT 47.7  MCV 101.7*  PLT 726   Basic Metabolic Panel: Recent Labs  Lab 12/02/17 1407  NA 138  K 3.9  CL 104  CO2 28  GLUCOSE 86  BUN 12  CREATININE 1.13  CALCIUM 9.5  MG 2.2   GFR: Estimated Creatinine Clearance: 55.3 mL/min (by C-G formula based on SCr of 1.13 mg/dL). Liver Function Tests: Recent Labs  Lab 12/02/17 1407  AST 26  ALT 24  ALKPHOS 72  BILITOT 0.8  PROT 6.5  ALBUMIN 3.7   Recent Labs  Lab 12/02/17 1407  LIPASE 50   No results for input(s): AMMONIA in the last 168 hours. Coagulation Profile: Recent Labs  Lab 12/02/17 1407  INR 1.04   Cardiac Enzymes: No results for input(s): CKTOTAL, CKMB, CKMBINDEX, TROPONINI in the last 168 hours. BNP (last 3 results) No results for input(s): PROBNP in the last 8760 hours. HbA1C: No results for input(s): HGBA1C in the last 72 hours. CBG: No results for input(s): GLUCAP in the last 168 hours. Lipid Profile: No results for input(s): CHOL, HDL, LDLCALC, TRIG, CHOLHDL, LDLDIRECT in the last 72 hours. Thyroid Function Tests: Recent Labs    12/02/17 1407  TSH 1.947   Anemia Panel: No results for input(s): VITAMINB12, FOLATE, FERRITIN, TIBC, IRON, RETICCTPCT in the last 72 hours. Urine analysis:    Component Value Date/Time   COLORURINE YELLOW 12/02/2017 1820   APPEARANCEUR CLEAR 12/02/2017 1820   LABSPEC 1.006 12/02/2017 1820   PHURINE 7.0 12/02/2017 1820   GLUCOSEU NEGATIVE 12/02/2017 1820   GLUCOSEU NEGATIVE 10/20/2017 0956   HGBUR NEGATIVE 12/02/2017 1820   BILIRUBINUR NEGATIVE 12/02/2017 1820   KETONESUR NEGATIVE 12/02/2017 1820   PROTEINUR NEGATIVE 12/02/2017 1820   UROBILINOGEN 1.0 10/20/2017 0956   NITRITE NEGATIVE 12/02/2017 1820  LEUKOCYTESUR NEGATIVE 12/02/2017 1820      Radiological Exams on Admission: Dg Chest 2 View  Result Date: 12/02/2017 CLINICAL DATA:  Chest pain EXAM: CHEST - 2 VIEW COMPARISON:  12/20/2011 CXR FINDINGS: Top-normal size heart with aortic atherosclerosis. Status post CABG. No pulmonary consolidation or CHF. No effusion or pneumothorax. ACDF hardware is partially visualized at the base of the neck. Surgical clip projects over the left side of the neck. Osteoarthritis of the glenohumeral joints bilaterally. IMPRESSION: 1. Aortic atherosclerosis. 2. No active cardiopulmonary disease. Electronically Signed   By: Ashley Royalty M.D.   On: 12/02/2017 16:30    EKG: Independently reviewed. Unable to locate p wave. Rr. No ischemic changes  Assessment/Plan Principal Problem:   Chest pain Active Problems:   Asthma   GERD   BARRETTS ESOPHAGUS   Diabetes ([place])   Hx of CABG   CAD (coronary artery disease)   Carotid artery disease ([place])   Atypical chest pain   # atypical chest pain - high risk patient, atypical symptoms. Etiology unclear. Initial 2 troponins and initial ekg reassuring. Has received asa. LE dopplers performed, negative for dvt. cxr unremarkable. Chest pain free currently, feeling normal self. Has been seen by cardiology who advise overnight observation, outpt w/u if no sig events overnight. Does have outpt tte scheduled on Monday (ordered to w/u possible recent TIA). - obs - tele - am ekg - trend troponins  # CAD - cont home asa, plavix, metoprolol  # dm - diet controlled, last a1c 6.5% 10/2017. Here glucose only midlly elevated - ctm  # mild cognitive impairment - cont home aricept  # insomnia - cont amitryptyline  # gerd - cont home pantop  # bph - denies symptoms obstruction - cont home flomax  DVT prophylaxis: lovenox, scds Code Status: full, confirmed w/ pt and family  Family Communication: son al [place] [phone]  Disposition Plan: tbd  Consults called: cardiology dr [place]  Admission status: obs  tele    Desma Maxim MD [facility] Pager [phone]  If 7PM-7AM, please contact night-coverage [email or web address] Password [facility]  12/02/2017, 7:48 PM

## 2017-12-02 NOTE — ED Notes (Signed)
ED Provider at bedside to provide patient and family with update on plan of care.

## 2017-12-02 NOTE — ED Notes (Signed)
ED Provider at bedside. 

## 2017-12-02 NOTE — ED Provider Notes (Signed)
[facility] EMERGENCY DEPARTMENT Provider Note   CSN: [id number] Arrival date & time: 12/02/17  1353     History   Chief Complaint Chief Complaint  Patient presents with  . Chest Pain    HPI Sean Lozano is a 84 y.o. male.  The history is provided by the patient, a relative, medical records and the EMS personnel (and son who is HCPOA).  Chest Pain   This is a recurrent problem. The current episode started 1 to 2 hours ago. The problem occurs constantly. The problem has been resolved. The pain is associated with rest. The pain is present in the substernal region and lateral region. The pain is at a severity of 5/10. The pain is moderate. The quality of the pain is described as heavy and pressure-like. The pain does not radiate. Associated symptoms include lower extremity edema (recently, resolved) and shortness of breath. Pertinent negatives include no back pain, no cough, no diaphoresis, no dizziness, no exertional chest pressure, no fever, no headaches, no hemoptysis, no leg pain, no malaise/fatigue, no nausea, no near-syncope, no numbness, no palpitations, no sputum production, no vomiting and no weakness. He has tried rest (ASA) for the symptoms. The treatment provided significant relief.  His past medical history is significant for CAD, CHF and MI.  Pertinent negatives for past medical history include no seizures.    Past Medical History:  Diagnosis Date  . 1st degree AV block   . Arthritis   . Asthma   . BARRETTS ESOPHAGUS 06/02/2007  . Benign prostatic hypertrophy   . Bladder neck obstruction 09/29/2010  . CAD (coronary artery disease)    a. CABG x 6 in 1992. b. s/p DES x 2 to SVG -> OM1/OM2 in 09/2010. c. s/p DES to mid LAD 10/2010. d. NSTEMI (off DAPT) s/p PTCA of instent renarrowing of DES in SVG-OM, Recs for lifelong DAPT   . Carotid artery disease ([place])    Doppler, May, 2014, 0-62% R. ICA, 37-62% LICA, with recent syncope patient sent for consult with  vascular surgery  . Carotid bruit    April, 2014  . Cervical disc disease   . Concussion July 09, 2012   Scalp  . Depression   . Diabetes mellitus    type II-diet  . Diverticulosis   . Ejection fraction    EF 60%, echo, June, 2012  . Fall at home July 09, 2012  . GERD (gastroesophageal reflux disease)   . Hearing loss 2014  . Hx of CABG    1992  . Hyperlipidemia    intolerance to Crestor Zocor Vytorin. He tolerates Pravachol...low HDL  . Paresthesias    successful surgery by Dr. Earnie Larsson  . Prostatitis   . Rosacea   . Sinus bradycardia    asymptomatic   . SKIN LESION 06/02/2007  . Subdural hematoma (H[facility])    Subdural hematoma secondary to falling off a ladder  April, 2014,  medical therapy,  subdural did not expand while blood levels of ddual antiplatelet therapy decreased over one week.  Episode of chest pain in the hospital,, dual antiplatelet therapy restarted approximately one week after it was held  . Syncope    The patient lost consciousness and hit his head July 09 2012, not clear yet if he had true syncope    Patient Active Problem List   Diagnosis Date Noted  . Weakness due to acute stroke ([place]) 10/20/2017  . Stroke ([place]) 10/20/2017  . Elevated MCV 06/08/2017  .  Back pain 06/08/2017  . Bilateral leg pain 12/01/2016  . Knee pain 09/19/2013  . Occlusion and stenosis of carotid artery without mention of cerebral infarction 08/22/2012  . Carotid artery disease ([place])   . Syncope   . Chronic diastolic congestive heart failure, NYHA class 1/ECHO 2010 07/10/2012  . SDH (subdural hematoma) ([place]) 07/09/2012  . Erectile dysfunction 04/01/2011  . Ejection fraction   . CAD (coronary artery disease)   . Hyperlipidemia   . Diabetes ([place])   . Hx of CABG   . Preventative health care 09/29/2010  . Dyspnea 09/29/2010  . Bladder neck obstruction 09/29/2010  . Depression 06/04/2007  . Asthma 06/04/2007  . GERD 06/04/2007  . BENIGN PROSTATIC HYPERTROPHY 06/04/2007  .  BARRETTS ESOPHAGUS 06/02/2007  . Rosacea 06/02/2007  . SKIN LESION 06/02/2007  . FATIGUE 06/02/2007    Past Surgical History:  Procedure Laterality Date  . APPENDECTOMY    . Barretts Esophagus   20/20/09  . Bladder neck obstruction  09/29/10  . C-spine disc surgery  03/2006  . C4-5 surgery  01/2009  . CARDIAC CATHETERIZATION  July 2012   stent placed  X's  4  . COCCYX FRACTURE SURGERY  1997  . CORONARY ARTERY BYPASS GRAFT  1992  . ENDARTERECTOMY Left 09/07/2012   Procedure: ENDARTERECTOMY CAROTID with patch angioplasty;  Surgeon: Mal Misty, MD;  Location: [facility];  Service: Vascular;  Laterality: Left;  . LEFT HEART CATHETERIZATION WITH CORONARY/GRAFT ANGIOGRAM N/A 12/21/2011   Procedure: LEFT HEART CATHETERIZATION WITH Beatrix Fetters;  Surgeon: Hillary Bow, MD;  Location: [facility] CATH [facility];  Service: Cardiovascular;  Laterality: N/A;  . TONSILLECTOMY          Home Medications    Prior to Admission medications   Medication Sig Start Date End Date Taking? Authorizing Provider  amitriptyline (ELAVIL) 50 MG tablet TAKE 1 TABLET (50 MG TOTAL) BY MOUTH AT BEDTIME AS NEEDED FOR SLEEP. 06/07/17   Biagio Borg, MD  aspirin (ASPIRIN EC) 81 MG EC tablet Take 81 mg by mouth daily.      [provider]  Cholecalciferol (VITAMIN D3) 5000 units CAPS Take 1 tablet by mouth daily.     [provider]  clopidogrel (PLAVIX) 75 MG tablet TAKE 1 TABLET BY MOUTH EVERY DAY 05/25/17   Sherren Mocha, MD  diphenhydrAMINE (BENADRYL) 25 MG tablet Take 25 mg by mouth daily as needed for allergies.     [provider]  donepezil (ARICEPT) 5 MG tablet Take 1 tablet (5 mg total) by mouth at bedtime. 11/25/17   Pieter Partridge, DO  metoprolol succinate (TOPROL-XL) 25 MG 24 hr tablet Take 0.5 tablets (12.5 mg total) by mouth 2 (two) times daily. 05/16/17   Sherren Mocha, MD  mometasone-formoterol [facility]) 100-5 MCG/ACT AERO Inhale 2 puffs into the lungs 2 (two) times daily.  Prn, to last 3 months 06/02/16   Biagio Borg, MD  montelukast (SINGULAIR) 10 MG tablet TAKE 1 TABLET (10 MG TOTAL) BY MOUTH DAILY. 06/22/17   Biagio Borg, MD  nitroGLYCERIN (NITROSTAT) 0.4 MG SL tablet Place 1 tablet (0.4 mg total) under the tongue every 5 (five) minutes as needed for chest pain. 04/29/17   Sherren Mocha, MD  pantoprazole (PROTONIX) 40 MG tablet TAKE 1 TABLET BY MOUTH EVERY DAY 05/25/17   Sherren Mocha, MD  pravastatin (PRAVACHOL) 40 MG tablet TAKE 1 TABLET BY MOUTH EVERY EVENING (FILLABLE 04/03/16) 05/02/17   Sherren Mocha, MD  tamsulosin (FLOMAX) 0.4 MG  CAPS capsule TAKE 1 CAPSULE (0.4 MG TOTAL) BY MOUTH DAILY AFTER SUPPER. 07/25/17   Biagio Borg, MD    Family History Family History  Problem Relation Age of Onset  . Heart disease Father        mother  . Heart attack Father   . Diabetes Sister        mother  . Kidney disease Sister   . Hypertension Son   . Hyperlipidemia Son   . Hypertension Son   . Diabetes Mother   . Heart disease Mother   . Diabetes Son   . Aneurysm Unknown   . Hyperlipidemia Unknown   . Liver cancer Unknown        grandfather    Social History Social History   Tobacco Use  . Smoking status: Former Smoker    Types: Cigarettes    Start date: 01/03/1955  . Smokeless tobacco: Never Used  Substance Use Topics  . Alcohol use: Yes    Comment: occ  . Drug use: No     Allergies   Atenolol; Rosuvastatin; Simvastatin; Ultram [tramadol]; Zofran [ondansetron hcl]; Adhesive [tape]; Codeine; and Ezetimibe-simvastatin   Review of Systems Review of Systems  Constitutional: Negative for chills, diaphoresis, fatigue, fever and malaise/fatigue.  HENT: Negative for congestion.   Eyes: Negative for visual disturbance.  Respiratory: Positive for shortness of breath. Negative for cough, hemoptysis, sputum production, choking, chest tightness, wheezing and stridor.   Cardiovascular: Positive for chest pain and leg swelling. Negative for  palpitations and near-syncope.  Gastrointestinal: Negative for constipation, diarrhea, nausea and vomiting.  Genitourinary: Negative for flank pain.  Musculoskeletal: Negative for back pain, neck pain and neck stiffness.  Skin: Negative for rash and wound.  Neurological: Negative for dizziness, seizures, weakness, light-headedness, numbness and headaches.  Psychiatric/Behavioral: Negative for agitation.  All other systems reviewed and are negative.    Physical Exam Updated Vital Signs BP 125/74 (BP Location: Right Arm)   Pulse 73   Temp 97.9 F (36.6 C) (Oral)   Resp 16   Ht 6' (1.829 m)   Wt 87 kg   SpO2 98%   BMI 26.01 kg/m   Physical Exam  Constitutional: He appears well-developed and well-nourished.  Non-toxic appearance. He does not appear ill. No distress.  HENT:  Head: Normocephalic and atraumatic.  Mouth/Throat: Oropharynx is clear and moist.  Eyes: Pupils are equal, round, and reactive to light. Conjunctivae are normal.  Neck: Normal range of motion. Neck supple.  Cardiovascular: Normal rate and regular rhythm. Exam reveals no gallop.  Murmur (faint) heard. Pulmonary/Chest: Effort normal and breath sounds normal. No respiratory distress. He has no wheezes. He has no rales. He exhibits no tenderness.  Abdominal: Soft. He exhibits no distension. There is no tenderness.  Musculoskeletal: He exhibits no edema or tenderness.       Right lower leg: He exhibits no tenderness and no edema.       Left lower leg: He exhibits no tenderness and no edema.  Neurological: He is alert. No sensory deficit. He exhibits normal muscle tone.  Skin: Skin is warm and dry. Capillary refill takes less than 2 seconds. He is not diaphoretic. No erythema. No pallor.  Psychiatric: He has a normal mood and affect.  Nursing note and vitals reviewed.    ED Treatments / Results  Labs (all labs ordered are listed, but only abnormal results are displayed) Labs Reviewed  CBC WITH  DIFFERENTIAL/PLATELET - Abnormal; Notable for the following components:  Result Value   MCV 101.7 (*)    Lymphs Abs 4.3 (*)    All other components within normal limits  COMPREHENSIVE METABOLIC PANEL - Abnormal; Notable for the following components:   GFR calc non Af Amer 59 (*)    All other components within normal limits  CBC - Abnormal; Notable for the following components:   WBC 10.7 (*)    MCV 100.7 (*)    All other components within normal limits  CREATININE, SERUM - Abnormal; Notable for the following components:   GFR calc non Af Amer 58 (*)    All other components within normal limits  URINE CULTURE  LIPASE, BLOOD  PROTIME-INR  MAGNESIUM  BRAIN NATRIURETIC PEPTIDE  URINALYSIS, ROUTINE W REFLEX MICROSCOPIC  TSH  TROPONIN I  TROPONIN I  TROPONIN I  I-STAT TROPONIN, ED  I-STAT TROPONIN, ED    EKG EKG Interpretation  Date/Time:  Friday December 02 2017 13:58:55 EDT Ventricular Rate:  71 PR Interval:    QRS Duration: 106 QT Interval:  410 QTC Calculation: 446 R Axis:   -39 Text Interpretation:  Sinus or ectopic atrial rhythm Prolonged PR interval Left axis deviation When compard to prior, no significnant changes seen.  No STEMI Confirmed by Antony Blackbird [phone]) on 12/02/2017 2:26:14 PM   Radiology Dg Chest 2 View  Result Date: 12/02/2017 CLINICAL DATA:  Chest pain EXAM: CHEST - 2 VIEW COMPARISON:  12/20/2011 CXR FINDINGS: Top-normal size heart with aortic atherosclerosis. Status post CABG. No pulmonary consolidation or CHF. No effusion or pneumothorax. ACDF hardware is partially visualized at the base of the neck. Surgical clip projects over the left side of the neck. Osteoarthritis of the glenohumeral joints bilaterally. IMPRESSION: 1. Aortic atherosclerosis. 2. No active cardiopulmonary disease. Electronically Signed   By: Ashley Royalty M.D.   On: 12/02/2017 16:30    Procedures Procedures (including critical care time)  Medications Ordered in ED Medications    aspirin EC tablet 81 mg (81 mg Oral Given 12/02/17 2233)  metoprolol succinate (TOPROL-XL) 24 hr tablet 12.5 mg (12.5 mg Oral Given 12/02/17 2230)  pravastatin (PRAVACHOL) tablet 40 mg (40 mg Oral Given 12/02/17 2231)  amitriptyline (ELAVIL) tablet 50 mg (50 mg Oral Given 12/02/17 2231)  pantoprazole (PROTONIX) EC tablet 40 mg (40 mg Oral Given 12/02/17 2231)  donepezil (ARICEPT) tablet 5 mg (5 mg Oral Given 12/02/17 2231)  tamsulosin (FLOMAX) capsule 0.4 mg (0.4 mg Oral Given 12/02/17 2237)  enoxaparin (LOVENOX) injection 40 mg (40 mg Subcutaneous Given 12/02/17 2231)  sodium chloride flush (NS) 0.9 % injection 3 mL (3 mLs Intravenous Given 12/02/17 2233)  sodium chloride flush (NS) 0.9 % injection 3 mL (has no administration in time range)  0.9 %  sodium chloride infusion (has no administration in time range)     Initial Impression / Assessment and Plan / ED Course  I have reviewed the triage vital signs and the nursing notes.  Pertinent labs & imaging results that were available during my care of the patient were reviewed by me and considered in my medical decision making (see chart for details).     Sean Lozano is a 84 y.o. male with a past medical history significant for CAD status post CABG and multiple PCI on ASA and plavix, CHF, hyperlipidemia, asthma, diabetes, subdural hematoma, Barrett's esophagus, and GERD who presents with chest pain.  Patient is accompanied by family and EMS who reports that around 12:30 PM today, patient started having chest pain.  He reports  the pain is in his central chest radiating towards the left chest.  He says this feels similar to prior MI.  He reports no nausea, vomiting, or diaphoresis but does report feeling lightheaded.  EMS said that they gave him aspirin and then he did not get nitroglycerin before his pain improved to 0 out of 10.  He says he is unsure if it is exertional but it is not pleuritic.  He reports he has had several falls over the last few  weeks however the son attributes this to him actually taking muscle relaxants which after discontinuation, his falls stopped.  Patient did report that he was having some leg swelling several weeks ago with the falls.  Denies leg pain currently.  Patient says he has had no recent fevers, chills, congestion or cough.  He did report feeling some shortness of breath with the pain earlier.  He described the pain as pressure-like and otherwise did not radiate.  He denied any other complaints including palpitations or syncope.  On exam, patient has a faint systolic murmur.  Lungs were clear.  Chest was nontender and I cannot reproduce the discomfort.  Legs were nontender and nonedematous.  Abdomen was nontender.  No CVA tenderness.  Patient is alert and oriented.  Next  Clinically I am concerned for a cardiac cause of his symptoms given his history of similar presentation with prior MI.  Anticipate speaking to cardiology after work-up is completed.  Initial EKG did not show STEMI.  Patient remains chest pain-free during initial work-up.  6:27 PM Diagnostic work-up was reassuring with negative troponin x2.  Chest x-ray shows no pneumonia.  BNP and TSH were normal.  Cardiology was called to come to the patient.  Dr. [place] cardiology recommended observation admission to medicine and they will see him in consultation given his history of cardiac disease and his chest pain today.    Medicine team will be called for admission.   Final Clinical Impressions(s) / ED Diagnoses   Final diagnoses:  Precordial pain     Clinical Impression: 1. Precordial pain     Disposition: Admit  This note was prepared with assistance of Dragon voice recognition software. Occasional wrong-word or sound-a-like substitutions may have occurred due to the inherent limitations of voice recognition software.     Tegeler, Gwenyth Allegra, MD 12/03/17 [phone]

## 2017-12-02 NOTE — Progress Notes (Signed)
Left lower extremity venous duplex completed - Preliminary results - There is no evidence of a DVT or Baker's cyst. Toma Copier, RVS 12/02/2017, 3:47 PM

## 2017-12-02 NOTE — ED Notes (Signed)
Pt using bedside urinal at this time. Cardiologist speaking with family.

## 2017-12-02 NOTE — ED Triage Notes (Signed)
Pt arrives to ED from [place] with complaints of chest pain since 12:30 this afternon. EMS reports they gave 324 asa with reduction of cp to 0/10 pain. EMS states when his abdomen is pushed on, the pain returns, thinks it may be gas related. Pt has significant cardiac hx, multiple stents and CABG. Pt also reports dizziness when changing positions quickly. Pt placed in position of comfort with bed locked and lowered, call bell in reach.

## 2017-12-02 NOTE — ED Notes (Signed)
Vascular bedside

## 2017-12-03 DIAGNOSIS — I2510 Atherosclerotic heart disease of native coronary artery without angina pectoris: Secondary | ICD-10-CM | POA: Diagnosis not present

## 2017-12-03 DIAGNOSIS — N400 Enlarged prostate without lower urinary tract symptoms: Secondary | ICD-10-CM | POA: Diagnosis not present

## 2017-12-03 DIAGNOSIS — E119 Type 2 diabetes mellitus without complications: Secondary | ICD-10-CM | POA: Diagnosis not present

## 2017-12-03 DIAGNOSIS — R0789 Other chest pain: Secondary | ICD-10-CM | POA: Diagnosis not present

## 2017-12-03 LAB — URINE CULTURE: Culture: NO GROWTH

## 2017-12-03 LAB — TROPONIN I
Troponin I: <0.03 ng/mL (ref <0.03)
Troponin I: <0.03 ng/mL (ref <0.03)

## 2017-12-03 NOTE — Progress Notes (Signed)
Discuss instructions given to Mr and Mrs Labell.  Discussed medications, follow up appointments and  Signs and symptoms to watch for and when to contact their physician.  Verbalized understanding.

## 2017-12-03 NOTE — Progress Notes (Signed)
      Dr Doug Sou consult note reviewed, patient presented with atypical chest/abdominal pain. Plan was to monitor enzymes overnight, if negative no plans for inpatient ischemic workup.   Trops remain negative, EKG without acute ischemic changes. Tele SR with occasional PVCs. No further inpatient workup planned at this time, has outpatient work already set up for his falls.    [facility] will sign off.   Medication Recommendations:  Continue current regimen Other recommendations (labs, testing, etc):  none Follow up as an outpatient:  We will arrange outpatient f/u 3-4 weeks.   Merrily Pew, MD  12/03/2017, 10:48 AM

## 2017-12-03 NOTE — Plan of Care (Signed)
Patient is adequate for discharge.  

## 2017-12-03 NOTE — Plan of Care (Signed)
Care plans reviewed and patient is progressing.  

## 2017-12-03 NOTE — Discharge Summary (Signed)
PATIENT DETAILS Name: Sean Lozano Age: 84 y.o. Sex: male Date of Birth: [birth year] MRN: [id number]. Admitting Physician: Gwynne Edinger, MD TDV:VOHY, Hunt Oris, MD  Admit Date: 12/02/2017 Discharge date: 12/03/2017  Recommendations for Outpatient Follow-up:  1. Follow up with PCP in 1-2 weeks 2. Please obtain BMP/CBC in one week 3. Please ensure follow-up with cardiology.  Admitted From:  Home  Disposition: [place]: No  Equipment/Devices: None  Discharge Condition: Stable  CODE STATUS: FULL CODE  Diet recommendation:  Heart Healthy  Brief Summary: See H&P, Labs, Consult and Test reports for all details in brief, patient is a 84 year old male with prior history of CAD status post remote CABG in the 1990s, last PCI in 2013 scented for evaluation of left-sided chest pain.  Brief Hospital Course: Atypical chest pain: Currently chest pain-free-EKG nonacute, troponins were not elevated.  Evaluated by cardiology with plans for outpatient work-up.  No further inpatient work-up recommended-stable for discharge.    History of dementia and multiple falls: Cardiology plans for outpatient echo and potentially a Holter monitor.  Patient being followed by neurology in the outpatient setting.  Rest of the medical issues were stable during the short overnight hospital stay.  Procedures/Studies: None  Discharge Diagnoses:  Principal Problem:   Chest pain Active Problems:   Asthma   GERD   BARRETTS ESOPHAGUS   Diabetes ([place])   Hx of CABG   CAD (coronary artery disease)   Carotid artery disease ([facility])   Atypical chest pain   Discharge Instructions:  Activity:  As tolerated with Full fall precautions use walker/cane & assistance as needed   Discharge Instructions    Diet - low sodium heart healthy   Complete by:  As directed    Discharge instructions   Complete by:  As directed    Follow with Primary MD  Biagio Borg, MD in 1 week  Follow up  with Dr Lorne Skeens will call for a follow up appt  Please get a complete blood count and chemistry panel checked by your Primary MD at your next visit, and again as instructed by your Primary MD.  Get Medicines reviewed and adjusted: Please take all your medications with you for your next visit with your Primary MD  Laboratory/radiological data: Please request your Primary MD to go over all hospital tests and procedure/radiological results at the follow up, please ask your Primary MD to get all Hospital records sent to his/her office.  In some cases, they will be blood work, cultures and biopsy results pending at the time of your discharge. Please request that your primary care M.D. follows up on these results.  Also Note the following: If you experience worsening of your admission symptoms, develop shortness of breath, life threatening emergency, suicidal or homicidal thoughts you must seek medical attention immediately by calling 911 or calling your MD immediately  if symptoms less severe.  You must read complete instructions/literature along with all the possible adverse reactions/side effects for all the Medicines you take and that have been prescribed to you. Take any new Medicines after you have completely understood and accpet all the possible adverse reactions/side effects.   Do not drive when taking Pain medications or sleeping medications (Benzodaizepines)  Do not take more than prescribed Pain, Sleep and Anxiety Medications. It is not advisable to combine anxiety,sleep and pain medications without talking with your primary care practitioner  Special Instructions: If you have smoked or chewed Tobacco  in the last  2 yrs please stop smoking, stop any regular Alcohol  and or any Recreational drug use.  Wear Seat belts while driving.  Please note: You were cared for by a hospitalist during your hospital stay. Once you are discharged, your primary care physician will handle any  further medical issues. Please note that NO REFILLS for any discharge medications will be authorized once you are discharged, as it is imperative that you return to your primary care physician (or establish a relationship with a primary care physician if you do not have one) for your post hospital discharge needs so that they can reassess your need for medications and monitor your lab values.   Increase activity slowly   Complete by:  As directed      Allergies as of 12/03/2017      Reactions   Atenolol Other (See Comments)   depression   Rosuvastatin Other (See Comments)   Severe hip pain   Simvastatin Other (See Comments)   Severe hip pain   Ultram [tramadol] Nausea And Vomiting   Zofran [ondansetron Hcl] Nausea Only, Other (See Comments)   Severe nausea and constipation   Adhesive [tape] Other (See Comments)   Breakdown skin   Codeine Other (See Comments)   Crazy feeling   Ezetimibe-simvastatin Other (See Comments)   Severe hip pain      Medication List    TAKE these medications   amitriptyline 50 MG tablet Commonly known as:  ELAVIL TAKE 1 TABLET (50 MG TOTAL) BY MOUTH AT BEDTIME AS NEEDED FOR SLEEP. What changed:  when to take this   aspirin EC 81 MG EC tablet Generic drug:  aspirin Take 81 mg by mouth at bedtime.   CALCIUM 600+D3 PO Take 1 tablet by mouth 2 (two) times daily.   clopidogrel 75 MG tablet Commonly known as:  PLAVIX TAKE 1 TABLET BY MOUTH EVERY DAY What changed:  when to take this   donepezil 5 MG tablet Commonly known as:  ARICEPT Take 1 tablet (5 mg total) by mouth at bedtime.   metoprolol succinate 25 MG 24 hr tablet Commonly known as:  TOPROL-XL Take 0.5 tablets (12.5 mg total) by mouth 2 (two) times daily.   nitroGLYCERIN 0.4 MG SL tablet Commonly known as:  NITROSTAT Place 1 tablet (0.4 mg total) under the tongue every 5 (five) minutes as needed for chest pain.   OVER THE COUNTER MEDICATION Take 2 tablets by mouth 2 (two) times daily.  Focus factor brain health memory dietary supplement   pantoprazole 40 MG tablet Commonly known as:  PROTONIX TAKE 1 TABLET BY MOUTH EVERY DAY What changed:  when to take this   polycarbophil 625 MG tablet Commonly known as:  FIBERCON Take 625 mg by mouth 2 (two) times daily.   pravastatin 40 MG tablet Commonly known as:  PRAVACHOL TAKE 1 TABLET BY MOUTH EVERY EVENING (FILLABLE 04/03/16) What changed:  See the new instructions.   tamsulosin 0.4 MG Caps capsule Commonly known as:  FLOMAX TAKE 1 CAPSULE (0.4 MG TOTAL) BY MOUTH DAILY AFTER SUPPER. What changed:  See the new instructions.      Follow-up Information    Biagio Borg, MD. Schedule an appointment as soon as possible for a visit in 1 week(s).   Specialties:  Internal Medicine, Radiology Contact information: [place] [address] [phone]        Sherren Mocha, MD .   Specialty:  Cardiology Contact information: [address]  [address] [place] [address] [phone]          Allergies  Allergen Reactions  . Atenolol Other (See Comments)    depression  . Rosuvastatin Other (See Comments)    Severe hip pain  . Simvastatin Other (See Comments)    Severe hip pain  . Ultram [Tramadol] Nausea And Vomiting  . Zofran [Ondansetron Hcl] Nausea Only and Other (See Comments)    Severe nausea and constipation  . Adhesive [Tape] Other (See Comments)    Breakdown skin  . Codeine Other (See Comments)    Crazy feeling  . Ezetimibe-Simvastatin Other (See Comments)    Severe hip pain    Consultations:   cardiology    Other Procedures/Studies: Dg Chest 2 View  Result Date: 12/02/2017 CLINICAL DATA:  Chest pain EXAM: CHEST - 2 VIEW COMPARISON:  12/20/2011 CXR FINDINGS: Top-normal size heart with aortic atherosclerosis. Status post CABG. No pulmonary consolidation or CHF. No effusion or pneumothorax. ACDF hardware is partially visualized at the base of the neck. Surgical  clip projects over the left side of the neck. Osteoarthritis of the glenohumeral joints bilaterally. IMPRESSION: 1. Aortic atherosclerosis. 2. No active cardiopulmonary disease. Electronically Signed   By: Ashley Royalty M.D.   On: 12/02/2017 16:30      TODAY-DAY OF DISCHARGE:  Subjective:   Friend Dorfman today has no headache,no chest abdominal pain,no new weakness tingling or numbness, feels much better wants to go home today.   Objective:   Blood pressure 92/68, pulse 65, temperature (!) 97.5 F (36.4 C), temperature source Oral, resp. rate 18, height 6' (1.829 m), weight 87 kg, SpO2 (!) 89 %. No intake or output data in the 24 hours ending 12/03/17 1108 Filed Weights   12/02/17 1357 12/02/17 2044  Weight: 87 kg 87 kg    Exam: Awake Alert, Oriented *3, No new F.N deficits, Normal affect [place].AT,PERRAL Supple Neck,No JVD, No cervical lymphadenopathy appriciated.  Symmetrical Chest wall movement, Good air movement bilaterally, CTAB RRR,No Gallops,Rubs or new Murmurs, No Parasternal Heave +ve B.Sounds, Abd Soft, Non tender, No organomegaly appriciated, No rebound -guarding or rigidity. No Cyanosis, Clubbing or edema, No new Rash or bruise   PERTINENT RADIOLOGIC STUDIES: Dg Chest 2 View  Result Date: 12/02/2017 CLINICAL DATA:  Chest pain EXAM: CHEST - 2 VIEW COMPARISON:  12/20/2011 CXR FINDINGS: Top-normal size heart with aortic atherosclerosis. Status post CABG. No pulmonary consolidation or CHF. No effusion or pneumothorax. ACDF hardware is partially visualized at the base of the neck. Surgical clip projects over the left side of the neck. Osteoarthritis of the glenohumeral joints bilaterally. IMPRESSION: 1. Aortic atherosclerosis. 2. No active cardiopulmonary disease. Electronically Signed   By: Ashley Royalty M.D.   On: 12/02/2017 16:30     PERTINENT LAB RESULTS: CBC: Recent Labs    12/02/17 1407 12/02/17 2221  WBC 9.8 10.7*  HGB 15.8 15.0  HCT 47.7 45.3  PLT 191 188    CMET CMP     Component Value Date/Time   NA 138 12/02/2017 1407   K 3.9 12/02/2017 1407   CL 104 12/02/2017 1407   CO2 28 12/02/2017 1407   GLUCOSE 86 12/02/2017 1407   BUN 12 12/02/2017 1407   CREATININE 1.14 12/02/2017 2221   CALCIUM 9.5 12/02/2017 1407   PROT 6.5 12/02/2017 1407   ALBUMIN 3.7 12/02/2017 1407   AST 26 12/02/2017 1407   ALT 24 12/02/2017 1407   ALKPHOS 72 12/02/2017 1407   BILITOT 0.8 12/02/2017 1407  GFRNONAA 58 (L) 12/02/2017 2221   GFRAA >60 12/02/2017 2221    GFR Estimated Creatinine Clearance: 54.8 mL/min (by C-G formula based on SCr of 1.14 mg/dL). Recent Labs    12/02/17 1407  LIPASE 50   Recent Labs    12/02/17 2221 12/03/17 0420  TROPONINI <0.03 <0.03   Invalid input(s): POCBNP No results for input(s): DDIMER in the last 72 hours. No results for input(s): HGBA1C in the last 72 hours. No results for input(s): CHOL, HDL, LDLCALC, TRIG, CHOLHDL, LDLDIRECT in the last 72 hours. Recent Labs    12/02/17 1407  TSH 1.947   No results for input(s): VITAMINB12, FOLATE, FERRITIN, TIBC, IRON, RETICCTPCT in the last 72 hours. Coags: Recent Labs    12/02/17 1407  INR 1.04   Microbiology: No results found for this or any previous visit (from the past 240 hour(s)).  FURTHER DISCHARGE INSTRUCTIONS:  Get Medicines reviewed and adjusted: Please take all your medications with you for your next visit with your Primary MD  Laboratory/radiological data: Please request your Primary MD to go over all hospital tests and procedure/radiological results at the follow up, please ask your Primary MD to get all Hospital records sent to his/her office.  In some cases, they will be blood work, cultures and biopsy results pending at the time of your discharge. Please request that your primary care M.D. goes through all the records of your hospital data and follows up on these results.  Also Note the following: If you experience worsening of your admission  symptoms, develop shortness of breath, life threatening emergency, suicidal or homicidal thoughts you must seek medical attention immediately by calling 911 or calling your MD immediately  if symptoms less severe.  You must read complete instructions/literature along with all the possible adverse reactions/side effects for all the Medicines you take and that have been prescribed to you. Take any new Medicines after you have completely understood and accpet all the possible adverse reactions/side effects.   Do not drive when taking Pain medications or sleeping medications (Benzodaizepines)  Do not take more than prescribed Pain, Sleep and Anxiety Medications. It is not advisable to combine anxiety,sleep and pain medications without talking with your primary care practitioner  Special Instructions: If you have smoked or chewed Tobacco  in the last 2 yrs please stop smoking, stop any regular Alcohol  and or any Recreational drug use.  Wear Seat belts while driving.  Please note: You were cared for by a hospitalist during your hospital stay. Once you are discharged, your primary care physician will handle any further medical issues. Please note that NO REFILLS for any discharge medications will be authorized once you are discharged, as it is imperative that you return to your primary care physician (or establish a relationship with a primary care physician if you do not have one) for your post hospital discharge needs so that they can reassess your need for medications and monitor your lab values.  Total Time spent coordinating discharge including counseling, education and face to face time equals 30  minutes.  SignedOren Binet 12/03/2017 11:08 AM

## 2017-12-05 ENCOUNTER — Telehealth: Payer: Self-pay | Admitting: *Deleted

## 2017-12-05 ENCOUNTER — Ambulatory Visit (HOSPITAL_COMMUNITY): Payer: Medicare Other | Attending: Cardiovascular Disease

## 2017-12-05 ENCOUNTER — Other Ambulatory Visit: Payer: Self-pay

## 2017-12-05 DIAGNOSIS — E785 Hyperlipidemia, unspecified: Secondary | ICD-10-CM | POA: Insufficient documentation

## 2017-12-05 DIAGNOSIS — I25810 Atherosclerosis of coronary artery bypass graft(s) without angina pectoris: Secondary | ICD-10-CM | POA: Insufficient documentation

## 2017-12-05 DIAGNOSIS — Z87891 Personal history of nicotine dependence: Secondary | ICD-10-CM | POA: Insufficient documentation

## 2017-12-05 DIAGNOSIS — E119 Type 2 diabetes mellitus without complications: Secondary | ICD-10-CM | POA: Diagnosis not present

## 2017-12-05 DIAGNOSIS — Z951 Presence of aortocoronary bypass graft: Secondary | ICD-10-CM | POA: Diagnosis not present

## 2017-12-05 NOTE — Telephone Encounter (Signed)
Transition Care Management Follow-up Telephone Call   Date discharged? 12/03/17   How have you been since you were released from the hospital? Spoke with pt son he's not with pt at this time but he states he has been doing ok   Do you understand why you were in the hospital? YES   Do you understand the discharge instructions? YES   Where were you discharged to? Home   Items Reviewed:  Medications reviewed: YES  Allergies reviewed: YES  Dietary changes reviewed: YES, heart healthy Referrals reviewed: YES, not sure of the appt date w/cardiology but he states he has one already made.   Functional Questionnaire:   Activities of Daily Living (ADLs):   He states he are independent in the following: bathing and hygiene, feeding, continence, grooming, toileting and dressing States they require assistance with the following: ambulation   Any transportation issues/concerns?: NO   Any patient concerns? NO   Confirmed importance and date/time of follow-up visits scheduled YES, appt 12/09/17  Provider Appointment booked with Dr. Jenny Reichmann   Confirmed with patient if condition begins to worsen call PCP or go to the ER.  Patient was given the office number and encouraged to call back with question or concerns.  : YES

## 2017-12-06 ENCOUNTER — Telehealth: Payer: Self-pay | Admitting: Internal Medicine

## 2017-12-06 NOTE — Telephone Encounter (Signed)
Copied from [place] [id number]. Topic: General - Other >> Dec 06, 2017 10:10 AM Yvette Rack wrote: Reason for CRM: Cyd Silence with [facility] request verbal orders extension for speech therapy for 1 time a week for 3 weeks. Cb# [phone]

## 2017-12-06 NOTE — Telephone Encounter (Signed)
Ok for verbals 

## 2017-12-06 NOTE — Telephone Encounter (Signed)
Notified Vanessa w/MD response.Marland KitchenJohny Chess

## 2017-12-07 ENCOUNTER — Ambulatory Visit: Payer: Medicare Other | Admitting: Internal Medicine

## 2017-12-08 ENCOUNTER — Telehealth: Payer: Self-pay

## 2017-12-08 NOTE — Telephone Encounter (Signed)
Called and LMOVM of Echo results and to call with any questions

## 2017-12-08 NOTE — Telephone Encounter (Signed)
-----   Message from Pieter Partridge, DO sent at 12/06/2017  6:19 AM EDT ----- Echocardiogram does not reveal anything that would change management

## 2017-12-09 ENCOUNTER — Encounter: Payer: Self-pay | Admitting: Internal Medicine

## 2017-12-09 ENCOUNTER — Other Ambulatory Visit (INDEPENDENT_AMBULATORY_CARE_PROVIDER_SITE_OTHER): Payer: Medicare Other

## 2017-12-09 ENCOUNTER — Ambulatory Visit: Payer: Medicare Other | Admitting: Internal Medicine

## 2017-12-09 VITALS — BP 112/72 | HR 78 | Temp 97.6°F | Ht 72.0 in | Wt 191.0 lb

## 2017-12-09 DIAGNOSIS — E785 Hyperlipidemia, unspecified: Secondary | ICD-10-CM | POA: Diagnosis not present

## 2017-12-09 DIAGNOSIS — W19XXXD Unspecified fall, subsequent encounter: Secondary | ICD-10-CM | POA: Diagnosis not present

## 2017-12-09 DIAGNOSIS — E119 Type 2 diabetes mellitus without complications: Secondary | ICD-10-CM

## 2017-12-09 DIAGNOSIS — R0789 Other chest pain: Secondary | ICD-10-CM

## 2017-12-09 LAB — BASIC METABOLIC PANEL
BUN: 13 mg/dL (ref 6–23)
CO2: 31 mEq/L (ref 19–32)
Calcium: 9.6 mg/dL (ref 8.4–10.5)
Chloride: 101 mEq/L (ref 96–112)
Creatinine, Ser: 1.21 mg/dL (ref 0.40–1.50)
GFR: 60.93 mL/min (ref >60.00)
Glucose, Bld: 116 mg/dL — ABNORMAL HIGH (ref 70–99)
Potassium: 3.8 mEq/L (ref 3.5–5.1)
Sodium: 137 mEq/L (ref 135–145)

## 2017-12-09 LAB — CBC WITH DIFFERENTIAL/PLATELET
Basophils Absolute: 0.1 10*3/uL (ref 0.0–0.1)
Basophils Relative: 0.9 % (ref 0.0–3.0)
Eosinophils Absolute: 0.3 10*3/uL (ref 0.0–0.7)
Eosinophils Relative: 2.3 % (ref 0.0–5.0)
HCT: 47.6 % (ref 39.0–52.0)
Hemoglobin: 16 g/dL (ref 13.0–17.0)
Lymphocytes Relative: 33.5 % (ref 12.0–46.0)
Lymphs Abs: 3.7 10*3/uL (ref 0.7–4.0)
MCHC: 33.7 g/dL (ref 30.0–36.0)
MCV: 101.1 fl — ABNORMAL HIGH (ref 78.0–100.0)
Monocytes Absolute: 0.8 10*3/uL (ref 0.1–1.0)
Monocytes Relative: 7.1 % (ref 3.0–12.0)
Neutro Abs: 6.2 10*3/uL (ref 1.4–7.7)
Neutrophils Relative %: 56.2 % (ref 43.0–77.0)
Platelets: 226 10*3/uL (ref 150.0–400.0)
RBC: 4.71 Mil/uL (ref 4.22–5.81)
RDW: 13.4 % (ref 11.5–15.5)
WBC: 10.9 10*3/uL — ABNORMAL HIGH (ref 4.0–10.5)

## 2017-12-09 NOTE — Patient Instructions (Addendum)
Ok to hold on the elavil, but call if worsening sleep or leg pain at night  Please call in 1 wk if you are not called per cardiology for the Monitor and/or the Cardiology appointment  Please continue all other medications as before, and refills have been done if requested.  Please have the pharmacy call with any other refills you may need.  Please continue your efforts at being more active, low cholesterol diet, and weight control.  Please keep your appointments with your specialists as you may have planned  Please go to the LAB in the Basement (turn left off the elevator) for the tests to be done today  You will be contacted by phone if any changes need to be made immediately.  Otherwise, you will receive a letter about your results with an explanation, but please check with MyChart first.  Please return in 6 months, or sooner if needed, with Lab testing done 3-5 days before  OK to cancel the October Appt

## 2017-12-09 NOTE — Progress Notes (Signed)
Subjective:    Patient ID: Sean Lozano, male    DOB: [birth year], 84 y.o.   MRN: [id number]  HPI     84 year old male with prior history of CAD status post remote CABG in the 1990s, last PCI in 2013 scented for evaluation of left-sided chest pain.  Atypical chest pain: Became chest pain-free-EKG nonacute, troponins were not elevated.  Evaluated by cardiology with plans for outpatient work-up.  No further inpatient work-up recommended-stable for discharge.  Pt has History of dementia and multiple falls which son with him thinks may have been due to muscle relaxer use: Cardiology plans for outpatient echo and potentially a Holter monitor with noting bigeminy at hospn.  Patient being followed by neurology in the outpatient setting.  Today states he is Doing well. Pt denies chest pain, increased sob or doe, wheezing, orthopnea, PND, increased LE swelling, palpitations, dizziness or syncope.  Pt denies fever, wt loss, night sweats, loss of appetite, or other constitutional symptoms Past Medical History:  Diagnosis Date  . 1st degree AV block   . Arthritis   . Asthma   . BARRETTS ESOPHAGUS 06/02/2007  . Benign prostatic hypertrophy   . Bladder neck obstruction 09/29/2010  . CAD (coronary artery disease)    a. CABG x 6 in 1992. b. s/p DES x 2 to SVG -> OM1/OM2 in 09/2010. c. s/p DES to mLAD 10/2010. d. 12/2011 NSTEMI (off DAPT) s/p PTCA of ISR of DES in SVG-OM, Recs for lifelong DAPT   . Carotid artery disease ([place])    Doppler, May, 2014, 0-10% R. ICA, 27-25% LICA, with recent syncope patient sent for consult with vascular surgery  . Carotid bruit    April, 2014  . Cervical disc disease   . Concussion July 09, 2012   Scalp  . Depression   . Diabetes mellitus    type II-diet  . Diverticulosis   . Ejection fraction    EF 60%, echo, June, 2012  . Fall at home July 09, 2012  . GERD (gastroesophageal reflux disease)   . Hearing loss 2014  . Hx of CABG    1992  . Hyperlipidemia    intolerance  to Crestor Zocor Vytorin. He tolerates Pravachol...low HDL  . Paresthesias    successful surgery by Dr. Earnie Larsson  . Prostatitis   . Rosacea   . Sinus bradycardia    asymptomatic   . SKIN LESION 06/02/2007  . Subdural hematoma (HCC)    Subdural hematoma secondary to falling off a ladder  April, 2014,  medical therapy,  subdural did not expand while blood levels of ddual antiplatelet therapy decreased over one week.  Episode of chest pain in the hospital,, dual antiplatelet therapy restarted approximately one week after it was held  . Syncope    The patient lost consciousness and hit his head July 09 2012, not clear yet if he had true syncope   Past Surgical History:  Procedure Laterality Date  . APPENDECTOMY    . Barretts Esophagus   20/20/09  . Bladder neck obstruction  09/29/10  . C-spine disc surgery  03/2006  . C4-5 surgery  01/2009  . CARDIAC CATHETERIZATION  July 2012   stent placed  X's  4  . COCCYX FRACTURE SURGERY  1997  . CORONARY ARTERY BYPASS GRAFT  1992  . ENDARTERECTOMY Left 09/07/2012   Procedure: ENDARTERECTOMY CAROTID with patch angioplasty;  Surgeon: Mal Misty, MD;  Location: [place];  Service: Vascular;  Laterality: Left;  .  LEFT HEART CATHETERIZATION WITH CORONARY/GRAFT ANGIOGRAM N/A 12/21/2011   Procedure: LEFT HEART CATHETERIZATION WITH Beatrix Fetters;  Surgeon: Hillary Bow, MD;  Location: [facility] C[facility] LAB;  Service: Cardiovascular;  Laterality: N/A;  . TONSILLECTOMY      reports that he has quit smoking. His smoking use included cigarettes. He started smoking about 62 years ago. He has never used smokeless tobacco. He reports that he drinks alcohol. He reports that he does not use drugs. family history includes Aneurysm in his unknown relative; Diabetes in his mother, sister, and son; Heart attack in his father; Heart disease in his father and mother; Hyperlipidemia in his son and unknown relative; Hypertension in his son and son; Kidney disease in his  sister; Liver cancer in his unknown relative. Allergies  Allergen Reactions  . Atenolol Other (See Comments)    depression  . Rosuvastatin Other (See Comments)    Severe hip pain  . Simvastatin Other (See Comments)    Severe hip pain  . Ultram [Tramadol] Nausea And Vomiting  . Zofran [Ondansetron Hcl] Nausea Only and Other (See Comments)    Severe nausea and constipation  . Adhesive [Tape] Other (See Comments)    Breakdown skin  . Codeine Other (See Comments)    Crazy feeling  . Ezetimibe-Simvastatin Other (See Comments)    Severe hip pain   Current Outpatient Medications on File Prior to Visit  Medication Sig Dispense Refill  . amitriptyline (ELAVIL) 50 MG tablet TAKE 1 TABLET (50 MG TOTAL) BY MOUTH AT BEDTIME AS NEEDED FOR SLEEP. (Patient taking differently: Take 50 mg by mouth at bedtime. ) 90 tablet 1  . aspirin (ASPIRIN EC) 81 MG EC tablet Take 81 mg by mouth at bedtime.     . Calcium Carb-Cholecalciferol (CALCIUM 600+D3 PO) Take 1 tablet by mouth 2 (two) times daily.    . clopidogrel (PLAVIX) 75 MG tablet TAKE 1 TABLET BY MOUTH EVERY DAY (Patient taking differently: Take 75 mg by mouth at bedtime. ) 90 tablet 3  . donepezil (ARICEPT) 5 MG tablet Take 1 tablet (5 mg total) by mouth at bedtime. 30 tablet 0  . metoprolol succinate (TOPROL-XL) 25 MG 24 hr tablet Take 0.5 tablets (12.5 mg total) by mouth 2 (two) times daily. 90 tablet 3  . nitroGLYCERIN (NITROSTAT) 0.4 MG SL tablet Place 1 tablet (0.4 mg total) under the tongue every 5 (five) minutes as needed for chest pain. 75 tablet 2  . OVER THE COUNTER MEDICATION Take 2 tablets by mouth 2 (two) times daily. Focus factor brain health memory dietary supplement    . pantoprazole (PROTONIX) 40 MG tablet TAKE 1 TABLET BY MOUTH EVERY DAY (Patient taking differently: Take 40 mg by mouth at bedtime. ) 90 tablet 3  . polycarbophil (FIBERCON) 625 MG tablet Take 625 mg by mouth 2 (two) times daily.    . pravastatin (PRAVACHOL) 40 MG tablet  TAKE 1 TABLET BY MOUTH EVERY EVENING (FILLABLE 04/03/16) (Patient taking differently: Take 40 mg by mouth at bedtime. ) 90 tablet 3  . tamsulosin (FLOMAX) 0.4 MG CAPS capsule TAKE 1 CAPSULE (0.4 MG TOTAL) BY MOUTH DAILY AFTER SUPPER. (Patient taking differently: Take 0.4 mg by mouth at bedtime. ) 90 capsule 2   No current facility-administered medications on file prior to visit.     Review of Systems  Constitutional: Negative for other unusual diaphoresis or sweats HENT: Negative for ear discharge or swelling Eyes: Negative for other worsening visual disturbances Respiratory: Negative for stridor or other  swelling  Gastrointestinal: Negative for worsening distension or other blood Genitourinary: Negative for retention or other urinary change Musculoskeletal: Negative for other MSK pain or swelling Skin: Negative for color change or other new lesions Neurological: Negative for worsening tremors and other numbness  Psychiatric/Behavioral: Negative for worsening agitation or other fatigue All other system neg per pt    Objective:   Physical Exam BP 112/72   Pulse 78   Temp 97.6 F (36.4 C) (Oral)   Ht 6' (1.829 m)   Wt 191 lb (86.6 kg)   SpO2 95%   BMI 25.90 kg/m  VS noted,  Constitutional: Pt appears in NAD HENT: Head: NCAT.  Right Ear: External ear normal.  Left Ear: External ear normal.  Eyes: . Pupils are equal, round, and reactive to light. Conjunctivae and EOM are normal Nose: without d/c or deformity Neck: Neck supple. Gross normal ROM Cardiovascular: Normal rate and regular rhythm.   Pulmonary/Chest: Effort normal and breath sounds without rales or wheezing.  Abd:  Soft, NT, ND, + BS, no organomegaly Neurological: Pt is alert. At baseline orientation, motor grossly intact Skin: Skin is warm. No rashes, other new lesions, no LE edema Psychiatric: Pt behavior is normal without agitation  No other exam findings  Lab Results  Component Value Date   WBC 10.7 (H)  12/02/2017   HGB 15.0 12/02/2017   HCT 45.3 12/02/2017   PLT 188 12/02/2017   GLUCOSE 86 12/02/2017   CHOL 130 10/20/2017   TRIG 129.0 10/20/2017   HDL 36.80 (L) 10/20/2017   LDLDIRECT 87.6 09/19/2008   LDLCALC 67 10/20/2017   ALT 24 12/02/2017   AST 26 12/02/2017   NA 138 12/02/2017   K 3.9 12/02/2017   CL 104 12/02/2017   CREATININE 1.14 12/02/2017   BUN 12 12/02/2017   CO2 28 12/02/2017   TSH 1.947 12/02/2017   PSA 1.47 11/20/2013   INR 1.04 12/02/2017   HGBA1C 6.5 10/20/2017   MICROALBUR 0.7 05/24/2016       Assessment & Plan:

## 2017-12-10 NOTE — Addendum Note (Signed)
Addended by: Biagio Borg on: 12/10/2017 04:07 PM   Modules accepted: Level of Service

## 2017-12-10 NOTE — Assessment & Plan Note (Signed)
To cont off muscle relaxer

## 2017-12-10 NOTE — Assessment & Plan Note (Signed)
stable overall by history and exam, recent data reviewed with pt, and pt to continue medical treatment as before,  to f/u any worsening symptoms or concerns Lab Results  Component Value Date   LDLCALC 67 10/20/2017

## 2017-12-10 NOTE — Assessment & Plan Note (Signed)
For cardiology f/u, ok for holter

## 2017-12-10 NOTE — Assessment & Plan Note (Signed)
stable overall by history and exam, recent data reviewed with pt, and pt to continue medical treatment as before,  to f/u any worsening symptoms or concerns Lab Results  Component Value Date   HGBA1C 6.5 10/20/2017

## 2017-12-18 ENCOUNTER — Other Ambulatory Visit: Payer: Self-pay | Admitting: Neurology

## 2017-12-18 DIAGNOSIS — G3184 Mild cognitive impairment, so stated: Secondary | ICD-10-CM

## 2017-12-29 LAB — HM DIABETES EYE EXAM

## 2018-01-25 ENCOUNTER — Ambulatory Visit: Payer: Medicare Other | Admitting: Internal Medicine

## 2018-01-28 DIAGNOSIS — M1712 Unilateral primary osteoarthritis, left knee: Secondary | ICD-10-CM | POA: Insufficient documentation

## 2018-04-18 ENCOUNTER — Ambulatory Visit: Payer: Medicare Other | Admitting: Neurology

## 2018-04-19 ENCOUNTER — Ambulatory Visit: Payer: Medicare Other | Admitting: Neurology

## 2018-04-21 ENCOUNTER — Ambulatory Visit (HOSPITAL_COMMUNITY)
Admission: RE | Admit: 2018-04-21 | Discharge: 2018-04-21 | Disposition: A | Discharge status: Home / Self Care | Payer: Medicare Other | Source: Ambulatory Visit | Attending: Family | Admitting: Family

## 2018-04-21 ENCOUNTER — Encounter: Payer: Self-pay | Admitting: Family

## 2018-04-21 ENCOUNTER — Other Ambulatory Visit: Payer: Self-pay

## 2018-04-21 ENCOUNTER — Ambulatory Visit (INDEPENDENT_AMBULATORY_CARE_PROVIDER_SITE_OTHER)
Admission: RE | Admit: 2018-04-21 | Discharge: 2018-04-21 | Disposition: A | Discharge status: Home / Self Care | Payer: Medicare Other | Source: Ambulatory Visit | Attending: Family | Admitting: Family

## 2018-04-21 ENCOUNTER — Ambulatory Visit (INDEPENDENT_AMBULATORY_CARE_PROVIDER_SITE_OTHER): Payer: Medicare Other | Admitting: Family

## 2018-04-21 VITALS — BP 134/81 | HR 55 | Temp 96.7°F | Resp 18 | Ht 70.0 in | Wt 205.0 lb

## 2018-04-21 DIAGNOSIS — Z9889 Other specified postprocedural states: Secondary | ICD-10-CM | POA: Insufficient documentation

## 2018-04-21 DIAGNOSIS — R0989 Other specified symptoms and signs involving the circulatory and respiratory systems: Secondary | ICD-10-CM

## 2018-04-21 DIAGNOSIS — I6522 Occlusion and stenosis of left carotid artery: Secondary | ICD-10-CM | POA: Diagnosis present

## 2018-04-21 DIAGNOSIS — I779 Disorder of arteries and arterioles, unspecified: Secondary | ICD-10-CM | POA: Diagnosis not present

## 2018-04-21 NOTE — Patient Instructions (Signed)

## 2018-04-21 NOTE — Progress Notes (Signed)
Chief Complaint: Follow up Extracranial Carotid Artery Stenosis   History of Present Illness  Sean Lozano is a 85 y.o. male who is status post left carotid endarterectomy on 09/07/2012 by Dr. Kellie Simmering. He returns today for routine follow up.  He denies any known history of stroke or TIA.    He denies claudication type sx's with walking, denies non healing wounds.  He reports left knee issues, sees ortho for this.    He has lumbar disc issues with left LE radiculopathy.  He sees Dr. Burt Knack for his CAD, states he has a hx of a mild MI, had 6 vessel CABG in 1992.  He fell, hit his head in August 2019, was evaluated medically, was hospitalized for this; after that he entered into senior apts, independent living with his wife.   At today's visit he denies chest pain or dyspnea, denies abdominal pain.    Diabetic: yes, states his last A1C was 7.1 Tobacco use: non-smoker, but exposed to his wife's secondhand smoke x 20 years until she quit  Pt meds include: Statin : yes ASA: yes Other anticoagulants/antiplatelets: Plavix   Past Medical History:  Diagnosis Date  . 1st degree AV block   . Arthritis   . Asthma   . BARRETTS ESOPHAGUS 06/02/2007  . Benign prostatic hypertrophy   . Bladder neck obstruction 09/29/2010  . CAD (coronary artery disease)    a. CABG x 6 in 1992. b. s/p DES x 2 to SVG -> OM1/OM2 in 09/2010. c. s/p DES to mLAD 10/2010. d. 12/2011 NSTEMI (off DAPT) s/p PTCA of ISR of DES in SVG-OM, Recs for lifelong DAPT   . Carotid artery disease ([place])    Doppler, May, 2014, 0-93% R. ICA, 81-82% LICA, with recent syncope patient sent for consult with vascular surgery  . Carotid bruit    April, 2014  . Cervical disc disease   . Concussion July 09, 2012   Scalp  . Depression   . Diabetes mellitus    type II-diet  . Diverticulosis   . Ejection fraction    EF 60%, echo, June, 2012  . Fall at home July 09, 2012  . GERD (gastroesophageal reflux disease)   .  Hearing loss 2014  . Hx of CABG    1992  . Hyperlipidemia    intolerance to Crestor Zocor Vytorin. He tolerates Pravachol...low HDL  . Paresthesias    successful surgery by Dr. Earnie Larsson  . Prostatitis   . Rosacea   . Sinus bradycardia    asymptomatic   . SKIN LESION 06/02/2007  . Subdural hematoma (HCC)    Subdural hematoma secondary to falling off a ladder  April, 2014,  medical therapy,  subdural did not expand while blood levels of ddual antiplatelet therapy decreased over one week.  Episode of chest pain in the hospital,, dual antiplatelet therapy restarted approximately one week after it was held  . Syncope    The patient lost consciousness and hit his head July 09 2012, not clear yet if he had true syncope    Social History Social History   Tobacco Use  . Smoking status: Former Smoker    Types: Cigarettes    Start date: 01/03/1955  . Smokeless tobacco: Never Used  Substance Use Topics  . Alcohol use: Yes    Comment: occ  . Drug use: No    Family History Family History  Problem Relation Age of Onset  . Heart disease Father  mother  . Heart attack Father   . Diabetes Sister        mother  . Kidney disease Sister   . Hypertension Son   . Hyperlipidemia Son   . Hypertension Son   . Diabetes Mother   . Heart disease Mother   . Diabetes Son   . Aneurysm Other   . Hyperlipidemia Other   . Liver cancer Other        grandfather    Surgical History Past Surgical History:  Procedure Laterality Date  . APPENDECTOMY    . Barretts Esophagus   20/20/09  . Bladder neck obstruction  09/29/10  . C-spine disc surgery  03/2006  . C4-5 surgery  01/2009  . CARDIAC CATHETERIZATION  July 2012   stent placed  X's  4  . COCCYX FRACTURE SURGERY  1997  . CORONARY ARTERY BYPASS GRAFT  1992  . ENDARTERECTOMY Left 09/07/2012   Procedure: ENDARTERECTOMY CAROTID with patch angioplasty;  Surgeon: Mal Misty, MD;  Location: [place];  Service: Vascular;  Laterality: Left;   . LEFT HEART CATHETERIZATION WITH CORONARY/GRAFT ANGIOGRAM N/A 12/21/2011   Procedure: LEFT HEART CATHETERIZATION WITH Beatrix Fetters;  Surgeon: Hillary Bow, MD;  Location: [facility] CATH LAB;  Service: Cardiovascular;  Laterality: N/A;  . TONSILLECTOMY      Allergies  Allergen Reactions  . Atenolol Other (See Comments)    depression  . Rosuvastatin Other (See Comments)    Severe hip pain  . Simvastatin Other (See Comments)    Severe hip pain  . Ultram [Tramadol] Nausea And Vomiting  . Zofran [Ondansetron Hcl] Nausea Only and Other (See Comments)    Severe nausea and constipation  . Adhesive [Tape] Other (See Comments)    Breakdown skin  . Codeine Other (See Comments)    Crazy feeling  . Ezetimibe-Simvastatin Other (See Comments)    Severe hip pain    Current Outpatient Medications  Medication Sig Dispense Refill  . amitriptyline (ELAVIL) 50 MG tablet TAKE 1 TABLET (50 MG TOTAL) BY MOUTH AT BEDTIME AS NEEDED FOR SLEEP. (Patient taking differently: Take 50 mg by mouth at bedtime. ) 90 tablet 1  . aspirin (ASPIRIN EC) 81 MG EC tablet Take 81 mg by mouth at bedtime.     . Calcium Carb-Cholecalciferol (CALCIUM 600+D3 PO) Take 1 tablet by mouth 2 (two) times daily.    . clopidogrel (PLAVIX) 75 MG tablet TAKE 1 TABLET BY MOUTH EVERY DAY (Patient taking differently: Take 75 mg by mouth at bedtime. ) 90 tablet 3  . donepezil (ARICEPT) 5 MG tablet TAKE 1 TABLET BY MOUTH EVERYDAY AT BEDTIME 30 tablet 5  . Influenza Vac A&B Surf Ant Adj (FLUAD) 0.5 ML SUSY Fluad 2019-20 38yr up(PF)45 mcg(15 mcgx3)/0.5 mL intramuscular syringe  ADM 0.5ML IM UTD    . metoprolol succinate (TOPROL-XL) 25 MG 24 hr tablet Take 0.5 tablets (12.5 mg total) by mouth 2 (two) times daily. 90 tablet 3  . nitroGLYCERIN (NITROSTAT) 0.4 MG SL tablet Place 1 tablet (0.4 mg total) under the tongue every 5 (five) minutes as needed for chest pain. 75 tablet 2  . OVER THE COUNTER MEDICATION Take 2 tablets by mouth 2  (two) times daily. Focus factor brain health memory dietary supplement    . pantoprazole (PROTONIX) 40 MG tablet TAKE 1 TABLET BY MOUTH EVERY DAY (Patient taking differently: Take 40 mg by mouth at bedtime. ) 90 tablet 3  . polycarbophil (FIBERCON) 625 MG tablet Take 625 mg by  mouth 2 (two) times daily.    . pravastatin (PRAVACHOL) 40 MG tablet TAKE 1 TABLET BY MOUTH EVERY EVENING (FILLABLE 04/03/16) (Patient taking differently: Take 40 mg by mouth at bedtime. ) 90 tablet 3  . tamsulosin (FLOMAX) 0.4 MG CAPS capsule TAKE 1 CAPSULE (0.4 MG TOTAL) BY MOUTH DAILY AFTER SUPPER. (Patient taking differently: Take 0.4 mg by mouth at bedtime. ) 90 capsule 2   No current facility-administered medications for this visit.     Review of Systems : See HPI for pertinent positives and negatives.  Physical Examination  Vitals:   04/21/18 1116 04/21/18 1118  BP: 132/77 134/81  Pulse: (!) 55   Resp: 18   Temp: (!) 96.7 F (35.9 C)   TempSrc: Oral   SpO2: 97%   Weight: 205 lb (93 kg)   Height: 5\' 10"  (1.778 m)    Body mass index is 29.41 kg/m.  General: WDWN male in NAD GAIT: normal Eyes: PERRLA HENT: No gross abnormalities.  Pulmonary:  Respirations are non-labored, good air movement in all fields, CTAB, no rales, rhonchi, or wheezes. Cardiac: regular rhythm, no detected murmur.  VASCULAR EXAM Carotid Bruits Right Left   Negative Negative     Abdominal aortic pulse is not palpable. Radial pulses are 2+ palpable and equal.                                                                                                                            LE Pulses Right Left       FEMORAL  2+ palpable  2+ palpable        POPLITEAL  not palpable   not palpable       POSTERIOR TIBIAL  not palpable   not palpable        DORSALIS PEDIS      ANTERIOR TIBIAL not palpable  not palpable     Gastrointestinal: soft, nontender, BS WNL, no r/g, no palpable masses. Musculoskeletal: no muscle  atrophy/wasting. M/S 5/5 in UE's, 4/5 in LE's, extremities without ischemic changes. Skin: No rashes, no ulcers, no cellulitis.   Neurologic:  A&O X 3; appropriate affect, sensation is normal; speech is normal, CN 2-12 intact, pain and light touch intact in extremities, motor exam as listed above. Psychiatric: Normal thought content, mood appropriate to clinical situation.    Assessment: YVETTE ROARK is a 85 y.o. male who is status post left carotid endarterectomy on 09/07/2012. He has no history of stroke or TIA.  His atherosclerotic risk factors include DM (currently in control) and CAD.  Fortunately he has never used tobacco.  He takes a daily statin, ASA 81 mg, and Plavix.   Pedal pulses are not palpable, pt has no claudication sx's, no signs of ischemia in his feet or legs.  ABI's indicate non compressible vessels, tri, bi, and monophasic waveforms.   No need to recheck ABI's unless he becomes symptomatic.   DATA  Carotid Duplex (04-21-18): Right ICA:  1-39% stenosis Left ICA: CEA site with 1-39% stenosis Bilateral vertebral artery flow is antegrade.  Bilateral subclavian artery waveforms are normal.  Slight increased stenosis in the left ICA compared to the exam on 10-19-16.   ABI (Date: 04/21/2018):  R:   ABI: [place] (no previous),   PT: bi  DP: mono, brisk  TBI:  0.57, toe pressure 78  L:   ABI: [place] (no previous),   PT: tri  DP: bi  TBI: 0.81, toe pressure 110 Bilateral ABI are not reliable due to non compressible vessels Right PT with biphasic waveforms, right DP with brisk monophasic waveforms. Left PT with triphasic waveforms, left DP with biphasic waveforms. No previous ABI for comparison.    Plan: Follow-up in 1 year with Carotid Duplex scan.  No need to recheck ABI's unless he becomes symptomatic.   Daily seated leg exercises discussed and demonstrated.  He fell, and is in senior independent living since then.    I discussed in depth with the  patient the nature of atherosclerosis, and emphasized the importance of maximal medical management including strict control of blood pressure, blood glucose, and lipid levels, obtaining regular exercise, and continued cessation of smoking.  The patient is aware that without maximal medical management the underlying atherosclerotic disease process will progress, limiting the benefit of any interventions. The patient was given information about stroke prevention and what symptoms should prompt the patient to seek immediate medical care. Thank you for allowing [place] to participate in this patient's care.  Clemon Chambers, RN, MSN, FNP-C [facility] Office: [phone]  Clinic Physician: Donzetta Matters  04/21/18 11:42 AM

## 2018-05-03 ENCOUNTER — Encounter: Payer: Self-pay | Admitting: Cardiovascular Disease

## 2018-05-03 ENCOUNTER — Ambulatory Visit: Payer: Medicare Other | Admitting: Cardiovascular Disease

## 2018-05-03 VITALS — BP 110/78 | HR 65 | Ht 70.0 in | Wt 203.4 lb

## 2018-05-03 DIAGNOSIS — E782 Mixed hyperlipidemia: Secondary | ICD-10-CM | POA: Diagnosis not present

## 2018-05-03 DIAGNOSIS — I10 Essential (primary) hypertension: Secondary | ICD-10-CM | POA: Diagnosis not present

## 2018-05-03 DIAGNOSIS — I2510 Atherosclerotic heart disease of native coronary artery without angina pectoris: Secondary | ICD-10-CM | POA: Diagnosis not present

## 2018-05-03 MED ORDER — NITROGLYCERIN 0.4 MG SL SUBL: 0.4000 mg | SUBLINGUAL_TABLET | SUBLINGUAL | 2 refills | Status: AC | PRN

## 2018-05-03 NOTE — Patient Instructions (Signed)
Medication Instructions:  Your provider recommends that you continue on your current medications as directed. Please refer to the Current Medication list given to you today.    Labwork: None  Testing/Procedures: None  Follow-Up: Your provider wants you to follow-up in: 1 year with Dr. Cooper or his assistant. You will receive a reminder letter in the mail two months in advance. If you don't receive a letter, please call our office to schedule the follow-up appointment.    

## 2018-05-03 NOTE — Progress Notes (Signed)
Cardiology Office Note:    Date:  05/03/2018   ID:  FABRICIO Lozano, DOB October 06, 1933, MRN [id number]  PCP:  Biagio Borg, MD  Cardiologist:  Sherren Mocha, MD  Electrophysiologist:  None   Referring MD: Biagio Borg, MD   Chief Complaint  Patient presents with  . Coronary Artery Disease    History of Present Illness:    Sean Lozano is a 85 y.o. male with a hx of coronary artery disease, presenting for follow-up evaluation.  He underwent CABG in 1992 and has undergone multiple PCI procedures most recently in 2012 and 2013. He is to be maintained on lifelong DAPT. LV function has been preserved. Other problems include Type II DM and hyperlipidemia. He has had a subdural hematoma after falling off of a ladder in 2014. He underwent carotid endarterectomy in 2014.  The patient is here with his son today.  He has moved into assisted living since I have last seen him.  His wife's dementia has progressed.  The patient is doing fairly well from a cardiac perspective.  In August 2019 he developed abdominal pain and was concerned it was cardiac-related.  He was evaluated at the hospital and seen in consultation by the inpatient cardiology team.  His troponins were negative and he was felt to have atypical symptoms.  He has not had any further problems.  He specifically denies any recent chest pain, chest pressure, or shortness of breath.  He has had no leg swelling, orthopnea, PND, or heart palpitations.  He has been compliant with his medications.  His son organizes his pills for the week every Sunday.   Past Medical History:  Diagnosis Date  . 1st degree AV block   . Arthritis   . Asthma   . BARRETTS ESOPHAGUS 06/02/2007  . Benign prostatic hypertrophy   . Bladder neck obstruction 09/29/2010  . CAD (coronary artery disease)    a. CABG x 6 in 1992. b. s/p DES x 2 to SVG -> OM1/OM2 in 09/2010. c. s/p DES to mLAD 10/2010. d. 12/2011 NSTEMI (off DAPT) s/p PTCA of ISR of DES in SVG-OM, Recs for  lifelong DAPT   . Carotid artery disease ([place])    Doppler, May, 2014, 7-34% R. ICA, 19-37% LICA, with recent syncope patient sent for consult with vascular surgery  . Carotid bruit    April, 2014  . Cervical disc disease   . Concussion July 09, 2012   Scalp  . Depression   . Diabetes mellitus    type II-diet  . Diverticulosis   . Ejection fraction    EF 60%, echo, June, 2012  . Fall at home July 09, 2012  . GERD (gastroesophageal reflux disease)   . Hearing loss 2014  . Hx of CABG    1992  . Hyperlipidemia    intolerance to Crestor Zocor Vytorin. He tolerates Pravachol...low HDL  . Paresthesias    successful surgery by Dr. Earnie Larsson  . Prostatitis   . Rosacea   . Sinus bradycardia    asymptomatic   . SKIN LESION 06/02/2007  . Subdural hematoma ([facility])    Subdural hematoma secondary to falling off a ladder  April, 2014,  medical therapy,  subdural did not expand while blood levels of ddual antiplatelet therapy decreased over one week.  Episode of chest pain in the hospital,, dual antiplatelet therapy restarted approximately one week after it was held  . Syncope    The patient lost consciousness and hit his  head July 09 2012, not clear yet if he had true syncope    Past Surgical History:  Procedure Laterality Date  . APPENDECTOMY    . Barretts Esophagus   20/20/09  . Bladder neck obstruction  09/29/10  . C-spine disc surgery  03/2006  . C4-5 surgery  01/2009  . CARDIAC CATHETERIZATION  July 2012   stent placed  X's  4  . COCCYX FRACTURE SURGERY  1997  . CORONARY ARTERY BYPASS GRAFT  1992  . ENDARTERECTOMY Left 09/07/2012   Procedure: ENDARTERECTOMY CAROTID with patch angioplasty;  Surgeon: Mal Misty, MD;  Location: [facility];  Service: Vascular;  Laterality: Left;  . LEFT HEART CATHETERIZATION WITH CORONARY/GRAFT ANGIOGRAM N/A 12/21/2011   Procedure: LEFT HEART CATHETERIZATION WITH Beatrix Fetters;  Surgeon: Hillary Bow, MD;  Location: [facility] CATH [facility];  Service:  Cardiovascular;  Laterality: N/A;  . TONSILLECTOMY      Current Medications: Current Meds  Medication Sig  . aspirin (ASPIRIN EC) 81 MG EC tablet Take 81 mg by mouth at bedtime.   . Calcium Carb-Cholecalciferol (CALCIUM 600+D3 PO) Take 1 tablet by mouth 2 (two) times daily.  . clopidogrel (PLAVIX) 75 MG tablet TAKE 1 TABLET BY MOUTH EVERY DAY  . donepezil (ARICEPT) 5 MG tablet TAKE 1 TABLET BY MOUTH EVERYDAY AT BEDTIME  . Influenza Vac A&B Surf Ant Adj (FLUAD) 0.5 ML SUSY Fluad 2019-20 66yr up(PF)45 mcg(15 mcgx3)/0.5 mL intramuscular syringe  ADM 0.5ML IM UTD  . metoprolol succinate (TOPROL-XL) 25 MG 24 hr tablet Take 0.5 tablets (12.5 mg total) by mouth 2 (two) times daily.  . nitroGLYCERIN (NITROSTAT) 0.4 MG SL tablet Place 1 tablet (0.4 mg total) under the tongue every 5 (five) minutes as needed for chest pain.  Marland Kitchen OVER THE COUNTER MEDICATION Take 2 tablets by mouth 2 (two) times daily. Focus factor brain health memory dietary supplement  . pantoprazole (PROTONIX) 40 MG tablet TAKE 1 TABLET BY MOUTH EVERY DAY  . polycarbophil (FIBERCON) 625 MG tablet Take 625 mg by mouth 2 (two) times daily.  . pravastatin (PRAVACHOL) 40 MG tablet TAKE 1 TABLET BY MOUTH EVERY EVENING (FILLABLE 04/03/16)  . tamsulosin (FLOMAX) 0.4 MG CAPS capsule TAKE 1 CAPSULE (0.4 MG TOTAL) BY MOUTH DAILY AFTER SUPPER.  . [DISCONTINUED] nitroGLYCERIN (NITROSTAT) 0.4 MG SL tablet Place 1 tablet (0.4 mg total) under the tongue every 5 (five) minutes as needed for chest pain.     Allergies:   Atenolol; Rosuvastatin; Simvastatin; Ultram [tramadol]; Zofran [ondansetron hcl]; Adhesive [tape]; Codeine; and Ezetimibe-simvastatin   Social History   Socioeconomic History  . Marital status: Married    Spouse name: Remo Lipps  . Number of children: 3  . Years of education: Not on file  . Highest education level: Bachelor's degree (e.g., BA, AB, BS)  Occupational History  . Occupation: retired-real estate  Social Needs  .  Financial resource strain: Not hard at all  . Food insecurity:    Worry: Never true    Inability: Never true  . Transportation needs:    Medical: No    Non-medical: No  Tobacco Use  . Smoking status: Former Smoker    Types: Cigarettes    Start date: 01/03/1955  . Smokeless tobacco: Never Used  Substance and Sexual Activity  . Alcohol use: Yes    Comment: occ  . Drug use: No  . Sexual activity: Never  Lifestyle  . Physical activity:    Days per week: 0 days    Minutes per  session: 0 min  . Stress: To some extent  Relationships  . Social connections:    Talks on phone: More than three times a week    Gets together: More than three times a week    Attends religious service: More than 4 times per year    Active member of club or organization: Not on file    Attends meetings of clubs or organizations: More than 4 times per year    Relationship status: Married  Other Topics Concern  . Not on file  Social History Narrative      Primary caregiver for wife who has dementia.      Patient is right-handed. He and his wife live in [place] retirement home. He was recently discharged from P/T 2 x a week.      Family History: The patient's family history includes Aneurysm in an other family member; Diabetes in his mother, sister, and son; Heart attack in his father; Heart disease in his father and mother; Hyperlipidemia in his son and another family member; Hypertension in his son and son; Kidney disease in his sister; Liver cancer in an other family member.  ROS:   Please see the history of present illness.    Positive for leg pain.  All other systems reviewed and are negative.  EKGs/Labs/Other Studies Reviewed:    The following studies were reviewed today: Echo 12/03/2017: Study Conclusions  - Left ventricle: The cavity size was normal. There was mild   concentric hypertrophy. Systolic function was vigorous. The   estimated ejection fraction was in the range of 65% to  70%. Wall   motion was normal; there were no regional wall motion   abnormalities. Doppler parameters are consistent with abnormal   left ventricular relaxation (grade 1 diastolic dysfunction). - Aortic valve: Transvalvular velocity was within the normal range.   There was no stenosis. There was no regurgitation. - Mitral valve: Transvalvular velocity was within the normal range.   There was no evidence for stenosis. There was no regurgitation. - Right ventricle: The cavity size was normal. Wall thickness was   normal. - Atrial septum: No defect or patent foramen ovale was identified   by color flow Doppler. - Tricuspid valve: There was mild regurgitation. - Pulmonary arteries: Systolic pressure was within the normal   range. PA peak pressure: 21 mm Hg (S). - Pericardium, extracardiac: A trivial pericardial effusion was   identified.  EKG:  EKG is not ordered today.   Recent Labs: 12/02/2017: ALT 24; B Natriuretic Peptide 47.5; Magnesium 2.2; TSH 1.947 12/09/2017: BUN 13; Creatinine, Ser 1.21; Hemoglobin 16.0; Platelets 226.0; Potassium 3.8; Sodium 137  Recent Lipid Panel    Component Value Date/Time   CHOL 130 10/20/2017 0956   TRIG 129.0 10/20/2017 0956   HDL 36.80 (L) 10/20/2017 0956   CHOLHDL 4 10/20/2017 0956   VLDL 25.8 10/20/2017 0956   LDLCALC 67 10/20/2017 0956   LDLDIRECT 87.6 09/19/2008 1546    Physical Exam:    VS:  BP 110/78   Pulse 65   Ht 5\' 10"  (1.778 m)   Wt 203 lb 6.4 oz (92.3 kg)   SpO2 96%   BMI 29.18 kg/m     Wt Readings from Last 3 Encounters:  05/03/18 203 lb 6.4 oz (92.3 kg)  04/21/18 205 lb (93 kg)  12/09/17 191 lb (86.6 kg)     GEN: Pleasant elderly male, well nourished, well developed in no acute distress HEENT: Normal NECK: No JVD; No  carotid bruits LYMPHATICS: No lymphadenopathy CARDIAC: RRR, no murmurs, rubs, gallops RESPIRATORY:  Clear to auscultation without rales, wheezing or rhonchi  ABDOMEN: Soft, non-tender,  non-distended MUSCULOSKELETAL:  No edema; No deformity  SKIN: Warm and dry NEUROLOGIC:  Alert and oriented x 3 PSYCHIATRIC:  Normal affect   ASSESSMENT:    1. Coronary artery disease involving native coronary artery of native heart without angina pectoris   2. Essential hypertension   3. Mixed hyperlipidemia    PLAN:    In order of problems listed above:  1. The patient appears clinically stable.  He will continue on his current medical program.  He is been managed with long-term dual antiplatelet therapy because of extensive CAD.  He seems to be tolerating this well. 2. Blood pressure is well controlled on metoprolol succinate. 3. Patient's lipids are reviewed and they have been at goal.  His LDL cholesterol is less than 70 mg/dL.  He is treated with a statin drug.    Medication Adjustments/Labs and Tests Ordered: Current medicines are reviewed at length with the patient today.  Concerns regarding medicines are outlined above.  No orders of the defined types were placed in this encounter.  Meds ordered this encounter  Medications  . nitroGLYCERIN (NITROSTAT) 0.4 MG SL tablet    Sig: Place 1 tablet (0.4 mg total) under the tongue every 5 (five) minutes as needed for chest pain.    Dispense:  75 tablet    Refill:  2    Patient Instructions  Medication Instructions:  Your provider recommends that you continue on your current medications as directed. Please refer to the Current Medication list given to you today.    Labwork: None  Testing/Procedures: None  Follow-Up: Your provider wants you to follow-up in: 1 year with Dr. Burt Knack or his assistant. You will receive a reminder letter in the mail two months in advance. If you don't receive a letter, please call our office to schedule the follow-up appointment.      Signed, Sherren Mocha, MD  05/03/2018 5:19 PM    [place]

## 2018-05-04 NOTE — Progress Notes (Signed)
NEUROLOGY FOLLOW UP OFFICE NOTE  Sean Lozano [id number]  HISTORY OF PRESENT ILLNESS: Sean Lozano is an 85 year old right-handed male with CAD status post CABG/and STEMI, diabetes mellitus, depression, carotid artery disease and hyperlipidemia who follows up for stroke and mild cognitive impairment.  He is accompanied by his son who supplements history.  UPDATE: Current medications:  Aricept 5mg , ASA, Plavix, pravastatin, Toprol  11/25/17:  B12 720, TSH 2.46 12/05/17 Echo with bubble study:  EF 65-70%.  No ASD, PFO or other cardiac source of emboli 04/21/18 Carotid doppler:  Bilateral 1-39% ICA stenosis s/p left CEA.  Vertebrals with antegrade flow.  He is doing well.  After the summer, his son threw out multiple medications, some old, that his father may have been taking.  He has since been more lucid but not like prior to the summer.  He lives at [facility] with his wife.  He enjoys living there.  His son sees him everyday.  His son manages his bills and medications.  He is independent in regards to dressing, bathing and using toilet.  He still drives but limits his driving, usually only short distances to the store.  He typically lets his son drive.     HISTORY: In 2014, he began experiencing unexplained falls, one of them causing a concussion.  Carotid doppler from May 2014 demonstrated severe left ICA stenosis of 80-99%.  He underwent left carotid endarterectomy that month.   Since December 2018, he has had several more falls.  Since he stopped Benadryl, tramadol and muscle relaxers, he has not had further falls.  In June, he had an episode of slurred speech, word-finding difficulty, difficulty operating the TV remote, using the phone.  Confusion has cleared but he continues to have word-finding and difficulty using the remote.  He has had trouble with managing medications.  He drives minimally.  He denies disorientation on familiar routes.  In hindsight, his son thinks the  confusion has been more gradual.  He and his wife has since moved in to a group retirement home.  Since June 2019, his son handles his bills out of precaution but he has been able to handle finances himself.  Most bills are on autopay.  He cares for his wife who has dementia.  He was already on ASA, Plavix and statin therapy.  He saw his PCP two weeks later. EKG looked okay.  MRI of brain from 10/27/17 was personally reviewed and demonstrated moderate to severe chronic small vessel ischemic changes but no acute or subacute infarct, bleed, or mass lesion.  Lipid panel from 10/20/17 showed total cholesterol 130, TG 129, HDL 36.80, and LDL 67.  Hgb A1c was 6.5.  Last carotid doppler available from 10/19/16 showed no hemodynamically significant stenosis.    He takes ASA 81mg  daily, Plavix 75mg  daily, pravastatin 40mg , metoprolol, amitriptyline 50mg  at bedtime as needed for sleep.  His mother had Alzheimer's disease.  She passed at 67.  His father died at 42 from aortic dissection.    PAST MEDICAL HISTORY: Past Medical History:  Diagnosis Date  . 1st degree AV block   . Arthritis   . Asthma   . BARRETTS ESOPHAGUS 06/02/2007  . Benign prostatic hypertrophy   . Bladder neck obstruction 09/29/2010  . CAD (coronary artery disease)    a. CABG x 6 in 1992. b. s/p DES x 2 to SVG -> OM1/OM2 in 09/2010. c. s/p DES to mLAD 10/2010. d. 12/2011 NSTEMI (off DAPT) s/p PTCA of ISR  of DES in SVG-OM, Recs for lifelong DAPT   . Carotid artery disease ([place])    Doppler, May, 2014, 3-38% R. ICA, 25-05% LICA, with recent syncope patient sent for consult with vascular surgery  . Carotid bruit    April, 2014  . Cervical disc disease   . Concussion July 09, 2012   Scalp  . Depression   . Diabetes mellitus    type II-diet  . Diverticulosis   . Ejection fraction    EF 60%, echo, June, 2012  . Fall at home July 09, 2012  . GERD (gastroesophageal reflux disease)   . Hearing loss 2014  . Hx of CABG    1992  .  Hyperlipidemia    intolerance to Crestor Zocor Vytorin. He tolerates Pravachol...low HDL  . Paresthesias    successful surgery by Dr. Earnie Larsson  . Prostatitis   . Rosacea   . Sinus bradycardia    asymptomatic   . SKIN LESION 06/02/2007  . Subdural hematoma ([facility])    Subdural hematoma secondary to falling off a ladder  April, 2014,  medical therapy,  subdural did not expand while blood levels of ddual antiplatelet therapy decreased over one week.  Episode of chest pain in the hospital,, dual antiplatelet therapy restarted approximately one week after it was held  . Syncope    The patient lost consciousness and hit his head July 09 2012, not clear yet if he had true syncope    MEDICATIONS: Current Outpatient Medications on File Prior to Visit  Medication Sig Dispense Refill  . aspirin (ASPIRIN EC) 81 MG EC tablet Take 81 mg by mouth at bedtime.     . Calcium Carb-Cholecalciferol (CALCIUM 600+D3 PO) Take 1 tablet by mouth 2 (two) times daily.    . clopidogrel (PLAVIX) 75 MG tablet TAKE 1 TABLET BY MOUTH EVERY DAY 90 tablet 3  . donepezil (ARICEPT) 5 MG tablet TAKE 1 TABLET BY MOUTH EVERYDAY AT BEDTIME 30 tablet 5  . Influenza Vac A&B Surf Ant Adj (FLUAD) 0.5 ML SUSY Fluad 2019-20 4yr up(PF)45 mcg(15 mcgx3)/0.5 mL intramuscular syringe  ADM 0.5ML IM UTD    . metoprolol succinate (TOPROL-XL) 25 MG 24 hr tablet Take 0.5 tablets (12.5 mg total) by mouth 2 (two) times daily. 90 tablet 3  . nitroGLYCERIN (NITROSTAT) 0.4 MG SL tablet Place 1 tablet (0.4 mg total) under the tongue every 5 (five) minutes as needed for chest pain. 75 tablet 2  . OVER THE COUNTER MEDICATION Take 2 tablets by mouth 2 (two) times daily. Focus factor brain health memory dietary supplement    . pantoprazole (PROTONIX) 40 MG tablet TAKE 1 TABLET BY MOUTH EVERY DAY 90 tablet 3  . polycarbophil (FIBERCON) 625 MG tablet Take 625 mg by mouth 2 (two) times daily.    . pravastatin (PRAVACHOL) 40 MG tablet TAKE 1 TABLET BY MOUTH  EVERY EVENING (FILLABLE 04/03/16) 90 tablet 3  . tamsulosin (FLOMAX) 0.4 MG CAPS capsule TAKE 1 CAPSULE (0.4 MG TOTAL) BY MOUTH DAILY AFTER SUPPER. 90 capsule 2   No current facility-administered medications on file prior to visit.     ALLERGIES: Allergies  Allergen Reactions  . Atenolol Other (See Comments)    depression  . Rosuvastatin Other (See Comments)    Severe hip pain  . Simvastatin Other (See Comments)    Severe hip pain  . Ultram [Tramadol] Nausea And Vomiting  . Zofran [Ondansetron Hcl] Nausea Only and Other (See Comments)    Severe nausea and  constipation  . Adhesive [Tape] Other (See Comments)    Breakdown skin  . Codeine Other (See Comments)    Crazy feeling  . Ezetimibe-Simvastatin Other (See Comments)    Severe hip pain    FAMILY HISTORY: Family History  Problem Relation Age of Onset  . Heart disease Father        mother  . Heart attack Father   . Diabetes Sister        mother  . Kidney disease Sister   . Hypertension Son   . Hyperlipidemia Son   . Hypertension Son   . Diabetes Mother   . Heart disease Mother   . Diabetes Son   . Aneurysm Other   . Hyperlipidemia Other   . Liver cancer Other        grandfather   SOCIAL HISTORY: Social History   Socioeconomic History  . Marital status: Married    Spouse name: Remo Lipps  . Number of children: 3  . Years of education: Not on file  . Highest education level: Bachelor's degree (e.g., BA, AB, BS)  Occupational History  . Occupation: retired-real estate  Social Needs  . Financial resource strain: Not hard at all  . Food insecurity:    Worry: Never true    Inability: Never true  . Transportation needs:    Medical: No    Non-medical: No  Tobacco Use  . Smoking status: Former Smoker    Types: Cigarettes    Start date: 01/03/1955  . Smokeless tobacco: Never Used  Substance and Sexual Activity  . Alcohol use: Yes    Comment: occ  . Drug use: No  . Sexual activity: Never  Lifestyle  . Physical  activity:    Days per week: 0 days    Minutes per session: 0 min  . Stress: To some extent  Relationships  . Social connections:    Talks on phone: More than three times a week    Gets together: More than three times a week    Attends religious service: More than 4 times per year    Active member of club or organization: Not on file    Attends meetings of clubs or organizations: More than 4 times per year    Relationship status: Married  . Intimate partner violence:    Fear of current or ex partner: No    Emotionally abused: No    Physically abused: No    Forced sexual activity: No  Other Topics Concern  . Not on file  Social History Narrative      Primary caregiver for wife who has dementia.      Patient is right-handed. He and his wife live in [facility] retirement home. He was recently discharged from P/T 2 x a week.     REVIEW OF SYSTEMS: Constitutional: No fevers, chills, or sweats, no generalized fatigue, change in appetite Eyes: No visual changes, double vision, eye pain Ear, nose and throat: No hearing loss, ear pain, nasal congestion, sore throat Cardiovascular: No chest pain, palpitations Respiratory:  No shortness of breath at rest or with exertion, wheezes GastrointestinaI: No nausea, vomiting, diarrhea, abdominal pain, fecal incontinence Genitourinary:  No dysuria, urinary retention or frequency Musculoskeletal:  No neck pain, back pain Integumentary: No rash, pruritus, skin lesions Neurological: as above Psychiatric: No depression, insomnia, anxiety Endocrine: No palpitations, fatigue, diaphoresis, mood swings, change in appetite, change in weight, increased thirst Hematologic/Lymphatic:  No purpura, petechiae. Allergic/Immunologic: no itchy/runny eyes, nasal congestion, recent allergic  reactions, rashes  PHYSICAL EXAM: Blood pressure 122/68, pulse 62, height 5\' 11"  (1.803 m), weight 203 lb (92.1 kg), SpO2 97 %. General: No acute distress.  Patient  appears well-groomed.   Head:  Normocephalic/atraumatic Eyes:  Fundi examined but not visualized Neck: supple, no paraspinal tenderness, full range of motion Heart:  Regular rate and rhythm Lungs:  Clear to auscultation bilaterally Back: No paraspinal tenderness Neurological Exam: alert and oriented to person, place, and time, except he said the year was 2000 and date was off by one day. Attention span and concentration impaired, recent memory impaired, remote memory intact, fund of knowledge intact.  Speech fluent and not dysarthric, language intact.   Able to copy intersecting pentagons but unable to copy a cube.  He was able to correctly complete Trail Making Test.  He struggled with drawing a clock but eventually able to place numbers correctly but hands were incorrect to requested time. MMSE - Mini Mental State Exam 05/05/2018 11/25/2017 06/08/2017  Orientation to time 3 4 5   Orientation to Place 5 5 5   Registration 3 3 3   Attention/ Calculation 4 5 4   Recall 1 1 1   Language- name 2 objects 2 2 2   Language- repeat 1 1 1   Language- follow 3 step command 3 3 3   Language- read & follow direction 1 1 1   Write a sentence 1 1 1   Copy design 1 1 1   Total score 25 27 27    CN II-XII intact. Bulk and tone normal, muscle strength 5/5 throughout.  Sensation to light touch, temperature and vibration intact.  Deep tendon reflexes 2+ throughout, toes downgoing.  Finger to nose and heel to shin testing intact.  Gait normal, Romberg negative.  IMPRESSION: 1.  Probably early dementia given loss of independence in managing both bills and medications, likely vascular but cannot rule Alzheimer's.  Unlikely that he had a stroke in July, rather episode of delirium in setting of increased emotional stress and possibly polypharmacy. 2.  Carotid artery disease s/p L CEA 3.  HTN 4.  HLD 5.  CAD  PLAN: 1.  Increase Aricept to 10mg  at bedtime 2.  Continue medications for treatment of stroke risk factors. 3.   Son to continue monitoring his parents and managing bills and medications.  Advised to monitor his father's driving.   4.  Follow up in 6 months.  25 minutes spent face to face with patient, over 50% spent discussing diagnosis and plan.  Metta Clines, DO  CC: Cathlean Cower, MD

## 2018-05-05 ENCOUNTER — Ambulatory Visit: Payer: Medicare Other | Admitting: Neurology

## 2018-05-05 ENCOUNTER — Encounter: Payer: Self-pay | Admitting: Neurology

## 2018-05-05 VITALS — BP 122/68 | HR 62 | Ht 71.0 in | Wt 203.0 lb

## 2018-05-05 DIAGNOSIS — I6522 Occlusion and stenosis of left carotid artery: Secondary | ICD-10-CM

## 2018-05-05 DIAGNOSIS — E785 Hyperlipidemia, unspecified: Secondary | ICD-10-CM

## 2018-05-05 DIAGNOSIS — F0150 Vascular dementia without behavioral disturbance: Secondary | ICD-10-CM | POA: Diagnosis not present

## 2018-05-05 DIAGNOSIS — I25810 Atherosclerosis of coronary artery bypass graft(s) without angina pectoris: Secondary | ICD-10-CM | POA: Diagnosis not present

## 2018-05-05 MED ORDER — DONEPEZIL HCL 10 MG PO TABS: 10.0000 mg | ORAL_TABLET | Freq: Every day | ORAL | 5 refills | Status: DC

## 2018-05-05 NOTE — Patient Instructions (Signed)
1.  Increase donepezil 10mg  at bedtime 2.  Continue aspirin and Plavix.  Continue pravastatin 3.  Follow up in 6 months.

## 2018-05-07 ENCOUNTER — Other Ambulatory Visit: Payer: Self-pay | Admitting: Cardiovascular Disease

## 2018-05-14 ENCOUNTER — Other Ambulatory Visit: Payer: Self-pay | Admitting: Internal Medicine

## 2018-06-11 ENCOUNTER — Other Ambulatory Visit: Payer: Self-pay | Admitting: Cardiovascular Disease

## 2018-06-14 ENCOUNTER — Encounter: Payer: Self-pay | Admitting: Internal Medicine

## 2018-06-14 ENCOUNTER — Observation Stay (HOSPITAL_COMMUNITY)
Admission: EM | Admit: 2018-06-14 | Discharge: 2018-06-15 | Disposition: A | Discharge status: Home / Self Care | Payer: Medicare Other | Attending: Internal Medicine | Admitting: Internal Medicine

## 2018-06-14 ENCOUNTER — Other Ambulatory Visit: Payer: Self-pay

## 2018-06-14 ENCOUNTER — Ambulatory Visit: Payer: Medicare Other | Admitting: Internal Medicine

## 2018-06-14 ENCOUNTER — Emergency Department (HOSPITAL_COMMUNITY): Payer: Medicare Other

## 2018-06-14 ENCOUNTER — Encounter (HOSPITAL_COMMUNITY): Payer: Self-pay | Admitting: Emergency Medicine

## 2018-06-14 VITALS — BP 118/74 | HR 32 | Temp 97.8°F | Ht 71.0 in | Wt 200.0 lb

## 2018-06-14 DIAGNOSIS — Z8249 Family history of ischemic heart disease and other diseases of the circulatory system: Secondary | ICD-10-CM | POA: Insufficient documentation

## 2018-06-14 DIAGNOSIS — N400 Enlarged prostate without lower urinary tract symptoms: Secondary | ICD-10-CM | POA: Insufficient documentation

## 2018-06-14 DIAGNOSIS — Z7902 Long term (current) use of antithrombotics/antiplatelets: Secondary | ICD-10-CM | POA: Insufficient documentation

## 2018-06-14 DIAGNOSIS — Z885 Allergy status to narcotic agent status: Secondary | ICD-10-CM | POA: Insufficient documentation

## 2018-06-14 DIAGNOSIS — F0390 Unspecified dementia without behavioral disturbance: Secondary | ICD-10-CM | POA: Insufficient documentation

## 2018-06-14 DIAGNOSIS — I5032 Chronic diastolic (congestive) heart failure: Secondary | ICD-10-CM | POA: Diagnosis not present

## 2018-06-14 DIAGNOSIS — Z888 Allergy status to other drugs, medicaments and biological substances status: Secondary | ICD-10-CM | POA: Diagnosis not present

## 2018-06-14 DIAGNOSIS — R001 Bradycardia, unspecified: Secondary | ICD-10-CM | POA: Insufficient documentation

## 2018-06-14 DIAGNOSIS — M25569 Pain in unspecified knee: Secondary | ICD-10-CM | POA: Diagnosis not present

## 2018-06-14 DIAGNOSIS — I252 Old myocardial infarction: Secondary | ICD-10-CM | POA: Diagnosis not present

## 2018-06-14 DIAGNOSIS — Z951 Presence of aortocoronary bypass graft: Secondary | ICD-10-CM

## 2018-06-14 DIAGNOSIS — E785 Hyperlipidemia, unspecified: Secondary | ICD-10-CM | POA: Diagnosis not present

## 2018-06-14 DIAGNOSIS — I110 Hypertensive heart disease with heart failure: Secondary | ICD-10-CM | POA: Diagnosis not present

## 2018-06-14 DIAGNOSIS — Z955 Presence of coronary angioplasty implant and graft: Secondary | ICD-10-CM | POA: Diagnosis not present

## 2018-06-14 DIAGNOSIS — Z833 Family history of diabetes mellitus: Secondary | ICD-10-CM | POA: Diagnosis not present

## 2018-06-14 DIAGNOSIS — Z7982 Long term (current) use of aspirin: Secondary | ICD-10-CM | POA: Insufficient documentation

## 2018-06-14 DIAGNOSIS — R5383 Other fatigue: Secondary | ICD-10-CM | POA: Insufficient documentation

## 2018-06-14 DIAGNOSIS — I440 Atrioventricular block, first degree: Secondary | ICD-10-CM | POA: Insufficient documentation

## 2018-06-14 DIAGNOSIS — R5381 Other malaise: Secondary | ICD-10-CM | POA: Diagnosis not present

## 2018-06-14 DIAGNOSIS — I25810 Atherosclerosis of coronary artery bypass graft(s) without angina pectoris: Secondary | ICD-10-CM

## 2018-06-14 DIAGNOSIS — E119 Type 2 diabetes mellitus without complications: Secondary | ICD-10-CM | POA: Insufficient documentation

## 2018-06-14 DIAGNOSIS — Z79899 Other long term (current) drug therapy: Secondary | ICD-10-CM | POA: Diagnosis not present

## 2018-06-14 DIAGNOSIS — I10 Essential (primary) hypertension: Secondary | ICD-10-CM | POA: Diagnosis not present

## 2018-06-14 DIAGNOSIS — K219 Gastro-esophageal reflux disease without esophagitis: Secondary | ICD-10-CM | POA: Insufficient documentation

## 2018-06-14 DIAGNOSIS — Z87891 Personal history of nicotine dependence: Secondary | ICD-10-CM | POA: Diagnosis not present

## 2018-06-14 DIAGNOSIS — I2510 Atherosclerotic heart disease of native coronary artery without angina pectoris: Secondary | ICD-10-CM | POA: Diagnosis not present

## 2018-06-14 LAB — CBC
HCT: 47 % (ref 39.0–52.0)
Hemoglobin: 15.9 g/dL (ref 13.0–17.0)
MCH: 34.9 pg — ABNORMAL HIGH (ref 26.0–34.0)
MCHC: 33.8 g/dL (ref 30.0–36.0)
MCV: 103.1 fL — ABNORMAL HIGH (ref 80.0–100.0)
Platelets: 188 10*3/uL (ref 150–400)
RBC: 4.56 MIL/uL (ref 4.22–5.81)
RDW: 12.9 % (ref 11.5–15.5)
WBC: 11 10*3/uL — ABNORMAL HIGH (ref 4.0–10.5)
nRBC: 0 % (ref 0.0–0.2)

## 2018-06-14 LAB — URINALYSIS, ROUTINE W REFLEX MICROSCOPIC
Bilirubin Urine: NEGATIVE
Glucose, UA: NEGATIVE mg/dL
Hgb urine dipstick: NEGATIVE
Ketones, ur: NEGATIVE mg/dL
Leukocytes,Ua: NEGATIVE
Nitrite: NEGATIVE
Protein, ur: NEGATIVE mg/dL
Specific Gravity, Urine: 1.019 (ref 1.005–1.030)
pH: 5 (ref 5.0–8.0)

## 2018-06-14 LAB — BASIC METABOLIC PANEL
Anion gap: 8 (ref 5–15)
BUN: 13 mg/dL (ref 8–23)
CO2: 24 mmol/L (ref 22–32)
Calcium: 9.3 mg/dL (ref 8.9–10.3)
Chloride: 106 mmol/L (ref 98–111)
Creatinine, Ser: 1.21 mg/dL (ref 0.61–1.24)
GFR calc Af Amer: >60 mL/min (ref >60)
GFR calc non Af Amer: 55 mL/min — ABNORMAL LOW (ref >60)
Glucose, Bld: 90 mg/dL (ref 70–99)
Potassium: 4 mmol/L (ref 3.5–5.1)
Sodium: 138 mmol/L (ref 135–145)

## 2018-06-14 LAB — MAGNESIUM: Magnesium: 2.4 mg/dL (ref 1.7–2.4)

## 2018-06-14 LAB — TSH: TSH: 2.323 u[IU]/mL (ref 0.350–4.500)

## 2018-06-14 LAB — I-STAT TROPONIN, ED: Troponin i, poc: 0 ng/mL (ref 0.00–0.08)

## 2018-06-14 MED ORDER — PRAVASTATIN SODIUM 40 MG PO TABS
40.0000 mg | ORAL_TABLET | Freq: Every evening | ORAL | Status: DC
  Administered 2018-06-14: 40 mg via ORAL
  Filled 2018-06-14: qty 1

## 2018-06-14 MED ORDER — ONDANSETRON HCL 4 MG PO TABS: 4.0000 mg | ORAL_TABLET | Freq: Four times a day (QID) | ORAL | Status: DC | PRN

## 2018-06-14 MED ORDER — PANTOPRAZOLE SODIUM 40 MG PO TBEC: 40.0000 mg | DELAYED_RELEASE_TABLET | Freq: Every day | ORAL | Status: DC

## 2018-06-14 MED ORDER — ACETAMINOPHEN 325 MG PO TABS: 650.0000 mg | ORAL_TABLET | Freq: Four times a day (QID) | ORAL | Status: DC | PRN

## 2018-06-14 MED ORDER — CALCIUM POLYCARBOPHIL 625 MG PO TABS
625.0000 mg | ORAL_TABLET | Freq: Two times a day (BID) | ORAL | Status: DC
  Administered 2018-06-14: 625 mg via ORAL
  Filled 2018-06-14 (×2): qty 1

## 2018-06-14 MED ORDER — ONDANSETRON HCL 4 MG/2ML IJ SOLN: 4.0000 mg | Freq: Four times a day (QID) | INTRAMUSCULAR | Status: DC | PRN

## 2018-06-14 MED ORDER — ACETAMINOPHEN 650 MG RE SUPP: 650.0000 mg | Freq: Four times a day (QID) | RECTAL | Status: DC | PRN

## 2018-06-14 MED ORDER — DONEPEZIL HCL 10 MG PO TABS
10.0000 mg | ORAL_TABLET | Freq: Every day | ORAL | Status: DC
  Administered 2018-06-14: 10 mg via ORAL
  Filled 2018-06-14: qty 1

## 2018-06-14 MED ORDER — CLOPIDOGREL BISULFATE 75 MG PO TABS
75.0000 mg | ORAL_TABLET | Freq: Every day | ORAL | Status: DC
  Filled 2018-06-14: qty 1

## 2018-06-14 MED ORDER — SENNOSIDES-DOCUSATE SODIUM 8.6-50 MG PO TABS: 1.0000 | ORAL_TABLET | Freq: Every evening | ORAL | Status: DC | PRN

## 2018-06-14 MED ORDER — SODIUM CHLORIDE 0.9% FLUSH
3.0000 mL | Freq: Once | INTRAVENOUS | Status: AC
  Administered 2018-06-14: 3 mL via INTRAVENOUS

## 2018-06-14 MED ORDER — NITROGLYCERIN 0.4 MG SL SUBL: 0.4000 mg | SUBLINGUAL_TABLET | SUBLINGUAL | Status: DC | PRN

## 2018-06-14 MED ORDER — HEPARIN SODIUM (PORCINE) 5000 UNIT/ML IJ SOLN
5000.0000 [IU] | Freq: Three times a day (TID) | INTRAMUSCULAR | Status: DC
  Administered 2018-06-14 – 2018-06-15 (×2): 5000 [IU] via SUBCUTANEOUS
  Filled 2018-06-14 (×2): qty 1

## 2018-06-14 MED ORDER — ASPIRIN EC 81 MG PO TBEC
81.0000 mg | DELAYED_RELEASE_TABLET | Freq: Every day | ORAL | Status: DC
  Administered 2018-06-14: 81 mg via ORAL
  Filled 2018-06-14: qty 1

## 2018-06-14 MED ORDER — TAMSULOSIN HCL 0.4 MG PO CAPS
0.4000 mg | ORAL_CAPSULE | Freq: Every day | ORAL | Status: DC
  Administered 2018-06-14: 0.4 mg via ORAL
  Filled 2018-06-14: qty 1

## 2018-06-14 NOTE — H&P (Signed)
[facility] History and Physical  Sean Lozano NTI:[id number] DOB: May 05, 1933 DOA: 06/14/2018  Referring physician:  PCP: Biagio Borg, MD   Chief Complaint: not feeling right ( low HR in PCP office)  HPI: Sean Lozano is a 85 y.o. male with medical history significant for CAD (CABG, 99 2, multiple PCI's, on aspirin and Plavix, carotid artery disease status post left CEA (2014), presumed early dementia on Aricept, hypertension on metoprolol, hyperlipidemia, history of subdural hematoma after fall(2014) who presents on 06/14/2018 with 2 days of generalized malaise and fatigue.  Patient was evaluated at his PCPs office and was found to have heart rate in the 30s on vital check.  EKG obtained in office showed chronic first-degree block with heart rate in the 60s patient was directed to the ED.  He reports and family corroborates since this Monday he is noted feeling more "mottled" and not feeling right.  Patient denies any chest pain, shortness of breath, recent falls, increased weakness, or loss of strength, no changes in speech.  He does have chronic difficulty in remembering and is being seen by neurology for probable early dementia for which he takes Aricept (increased to 10 mg on 04/2018).  Patient also has history of carotid artery disease status post left CEA in 2014.  He has history of CAD with CABG (9092) has undergone multiple PCI procedures most recently in 2013.  Has been a lifelong aspirin and Plavix since.  Followed by Dr. Burt Knack with [facility]  ED Course:  Heart rate is range 49-60 but has remained consistently in the 60s on monitoring with no heart rates in the 30s.  EKG showed first-degree heart block with heart rate in the 60s UA was unremarkable, BMP also unremarkable.  Troponin 0 0.00.  CT head showed chronic microvascular ischemia with no acute changes.  Chest x-ray showed no active disease.  [facility] was called for further management  Review of Systems:    Constitutional:  No weight loss, night sweats, Fevers, chills, fatigue.  HEENT:  No headaches, Difficulty swallowing,Tooth/dental problems,Sore throat,  No sneezing, itching, ear ache, nasal congestion, post nasal drip,  Cardio-vascular:  No chest pain, Orthopnea, PND, swelling in lower extremities, anasarca, dizziness, palpitations  GI:  No heartburn, indigestion, abdominal pain, nausea, vomiting, diarrhea, change in bowel habits-stool softeners ( months), loss of appetite  Resp:  No shortness of breath with exertion or at rest. No excess mucus, no productive cough, No non-productive cough, No coughing up of blood.No change in color of mucus.No wheezing.No chest wall deformity  Skin:  no rash or lesions.  GU:  no dysuria, change in color of urine, no urgency or frequency. No flank pain. Difficulty urinating Musculoskeletal:  No joint pain or swelling. No decreased range of motion. No back pain.  Psych:  No change in mood or affect. No depression or anxiety. No memory loss.   Past Medical History:  Diagnosis Date  . 1st degree AV block   . Arthritis   . Asthma   . BARRETTS ESOPHAGUS 06/02/2007  . Benign prostatic hypertrophy   . Bladder neck obstruction 09/29/2010  . CAD (coronary artery disease)    a. CABG x 6 in 1992. b. s/p DES x 2 to SVG -> OM1/OM2 in 09/2010. c. s/p DES to mLAD 10/2010. d. 12/2011 NSTEMI (off DAPT) s/p PTCA of ISR of DES in SVG-OM, Recs for lifelong DAPT   . Carotid artery disease ([place])    Doppler, May, 2014, 0-39% R. ICA, 80-99%  LICA, with recent syncope patient sent for consult with vascular surgery  . Carotid bruit    April, 2014  . Cervical disc disease   . Concussion July 09, 2012   Scalp  . Depression   . Diabetes mellitus    type II-diet  . Diverticulosis   . Ejection fraction    EF 60%, echo, June, 2012  . Fall at home July 09, 2012  . GERD (gastroesophageal reflux disease)   . Hearing loss 2014  . Hx of CABG    1992  . Hyperlipidemia     intolerance to Crestor Zocor Vytorin. He tolerates Pravachol...low HDL  . Paresthesias    successful surgery by Dr. Earnie Larsson  . Prostatitis   . Rosacea   . Sinus bradycardia    asymptomatic   . SKIN LESION 06/02/2007  . Subdural hematoma ([facility])    Subdural hematoma secondary to falling off a ladder  April, 2014,  medical therapy,  subdural did not expand while blood levels of ddual antiplatelet therapy decreased over one week.  Episode of chest pain in the hospital,, dual antiplatelet therapy restarted approximately one week after it was held  . Syncope    The patient lost consciousness and hit his head July 09 2012, not clear yet if he had true syncope   Past Surgical History:  Procedure Laterality Date  . APPENDECTOMY    . Barretts Esophagus   20/20/09  . Bladder neck obstruction  09/29/10  . C-spine disc surgery  03/2006  . C4-5 surgery  01/2009  . CARDIAC CATHETERIZATION  July 2012   stent placed  X's  4  . COCCYX FRACTURE SURGERY  1997  . CORONARY ARTERY BYPASS GRAFT  1992  . ENDARTERECTOMY Left 09/07/2012   Procedure: ENDARTERECTOMY CAROTID with patch angioplasty;  Surgeon: Mal Misty, MD;  Location: [place];  Service: Vascular;  Laterality: Left;  . LEFT HEART CATHETERIZATION WITH CORONARY/GRAFT ANGIOGRAM N/A 12/21/2011   Procedure: LEFT HEART CATHETERIZATION WITH Beatrix Fetters;  Surgeon: Hillary Bow, MD;  Location: [facility] C[facility] LAB;  Service: Cardiovascular;  Laterality: N/A;  . TONSILLECTOMY     Social History:  reports that he has quit smoking. His smoking use included cigarettes. He started smoking about 63 years ago. He has never used smokeless tobacco. He reports current alcohol use. He reports that he does not use drugs.  Allergies  Allergen Reactions  . Atenolol Other (See Comments)    depression  . Rosuvastatin Other (See Comments)    Severe hip pain  . Simvastatin Other (See Comments)    Severe hip pain  . Ultram [Tramadol] Nausea And Vomiting  .  Zofran [Ondansetron Hcl] Nausea Only and Other (See Comments)    Severe nausea and constipation  . Adhesive [Tape] Other (See Comments)    Breakdown skin  . Codeine Other (See Comments)    Crazy feeling  . Ezetimibe-Simvastatin Other (See Comments)    Severe hip pain    Family History  Problem Relation Age of Onset  . Heart disease Father        mother  . Heart attack Father   . Diabetes Sister        mother  . Kidney disease Sister   . Hypertension Son   . Hyperlipidemia Son   . Hypertension Son   . Diabetes Mother   . Heart disease Mother   . Diabetes Son   . Aneurysm Other   . Hyperlipidemia Other   . Liver  cancer Other        grandfather      Prior to Admission medications   Medication Sig Start Date End Date Taking? Authorizing Provider  aspirin (ASPIRIN EC) 81 MG EC tablet Take 81 mg by mouth at bedtime.     [provider]  Calcium Carb-Cholecalciferol (CALCIUM 600+D3 PO) Take 1 tablet by mouth 2 (two) times daily.    [provider]  clopidogrel (PLAVIX) 75 MG tablet TAKE 1 TABLET BY MOUTH EVERY DAY 05/09/18   Sherren Mocha, MD  donepezil (ARICEPT) 10 MG tablet Take 1 tablet (10 mg total) by mouth at bedtime. 05/05/18   Pieter Partridge, DO  Influenza Vac A&B Surf Ant Adj (FLUAD) 0.5 ML SUSY Fluad 2019-20 73yr up(PF)45 mcg(15 mcgx3)/0.5 mL intramuscular syringe  ADM 0.5ML IM UTD    [provider]  metoprolol succinate (TOPROL-XL) 25 MG 24 hr tablet Take 0.5 tablets (12.5 mg total) by mouth 2 (two) times daily. 05/16/17   Sherren Mocha, MD  nitroGLYCERIN (NITROSTAT) 0.4 MG SL tablet Place 1 tablet (0.4 mg total) under the tongue every 5 (five) minutes as needed for chest pain. 05/03/18   Sherren Mocha, MD  OVER THE COUNTER MEDICATION Take 2 tablets by mouth 2 (two) times daily. Focus factor brain health memory dietary supplement    [provider]  pantoprazole (PROTONIX) 40 MG tablet TAKE 1 TABLET BY MOUTH EVERY DAY 05/25/17   Sherren Mocha, MD  polycarbophil (FIBERCON) 625 MG tablet Take 625 mg by mouth 2 (two) times daily.    [provider]  pravastatin (PRAVACHOL) 40 MG tablet TAKE 1 TABLET BY MOUTH EVERY EVENING 06/13/18   Sherren Mocha, MD  tamsulosin (FLOMAX) 0.4 MG CAPS capsule TAKE 1 CAPSULE (0.4 MG TOTAL) BY MOUTH DAILY AFTER SUPPER. 05/15/18   Biagio Borg, MD   Physical Exam: Vitals:   06/14/18 1425 06/14/18 1445 06/14/18 1600 06/14/18 1615  BP: (!) 150/80 (!) 145/82 135/66 132/63  Pulse: 66 (!) 59 (!) 53 (!) 50  Resp: 20 (!) 21    Temp:      TempSrc:      SpO2: 97% 96% 97% 95%    Wt Readings from Last 3 Encounters:  06/14/18 90.7 kg  05/05/18 92.1 kg  05/03/18 92.3 kg    Constitutional normal appearing elderly male in no distress Eyes: EOMI, anicteric, normal conjunctivae ENMT: Oropharynx with moist mucous membranes, normal dentition Neck: FROM,  Cardiovascular: RRR no MRGs, with no peripheral edema Respiratory: Normal respiratory effort on room air, clear breath sounds  Abdomen: Soft,non-tender,  Musculoskeletal: no clubbing / cyanosis. No joint deformity upper and lower extremities. Good ROM, no contractures. Normal muscle tone. Skin: No rash ulcers, or lesions. Without skin tenting  Neurologic: Grossly no focal neuro deficit. Psychiatric:Appropriate affect, and mood. Mental status AAOx3          Labs on Admission:  Basic Metabolic Panel: Recent Labs  Lab 06/14/18 1235  NA 138  K 4.0  CL 106  CO2 24  GLUCOSE 90  BUN 13  CREATININE 1.21  CALCIUM 9.3  MG 2.4   Liver Function Tests: No results for input(s): AST, ALT, ALKPHOS, BILITOT, PROT, ALBUMIN in the last 168 hours. No results for input(s): LIPASE, AMYLASE in the last 168 hours. No results for input(s): AMMONIA in the last 168 hours. CBC: Recent Labs  Lab 06/14/18 1235  WBC 11.0*  HGB 15.9  HCT 47.0  MCV 103.1*  PLT 188   Cardiac  Enzymes: No results for input(s): CKTOTAL, CKMB, CKMBINDEX, TROPONINI in the  last 168 hours.  BNP (last 3 results) Recent Labs    12/02/17 1407  BNP 47.5    ProBNP (last 3 results) No results for input(s): PROBNP in the last 8760 hours.  CBG: No results for input(s): GLUCAP in the last 168 hours.  Radiological Exams on Admission: Dg Chest 2 View  Result Date: 06/14/2018 CLINICAL DATA:  Acute chest pain EXAM: CHEST - 2 VIEW COMPARISON:  12/02/2017 and prior radiographs FINDINGS: The cardiomediastinal silhouette is unremarkable. CABG changes again noted. There is no evidence of focal airspace disease, pulmonary edema, suspicious pulmonary nodule/mass, pleural effusion, or pneumothorax. No acute bony abnormalities are identified. IMPRESSION: No active cardiopulmonary disease. Electronically Signed   By: Margarette Canada M.D.   On: 06/14/2018 13:28   Ct Head Wo Contrast  Result Date: 06/14/2018 CLINICAL DATA:  Increasing confusion and weakness EXAM: CT HEAD WITHOUT CONTRAST TECHNIQUE: Contiguous axial images were obtained from the base of the skull through the vertex without intravenous contrast. COMPARISON:  09/01/2015 head CT FINDINGS: BRAIN: There is age related involutional changes of the brain with mild sulcal and ventricular prominence. No intraparenchymal hemorrhage, mass effect nor midline shift. Periventricular and subcortical white matter hypodensities consistent with mild-to-moderate chronic small vessel ischemic disease are identified. No acute large vascular territory infarcts. No abnormal extra-axial fluid collections. Basal cisterns are not effaced and midline. Brainstem and cerebellum are nonacute. VASCULAR: Moderate calcific atherosclerosis of the carotid siphons and distal vertebral arteries. SKULL: No skull fracture. No significant scalp soft tissue swelling. SINUSES/ORBITS: The mastoid air-cells are clear. The included paranasal sinuses are well-aerated.The included ocular globes and orbital contents are non-suspicious. OTHER: None. IMPRESSION: Atrophy with  chronic microvascular ischemia. No acute intracranial abnormality. Electronically Signed   By: Ashley Royalty M.D.   On: 06/14/2018 16:10    EKG: Independently reviewed. normal EKG, normal sinus rhythm, unchanged from previous tracings, 1st degree AV block.  Assessment/Plan Active Problems:   Fatigue   1. Reported bradycardia.  Patient has chronic first-degree heart block but heart rate remained in the 50s to 60s in ED.  Monitor on telemetry overnight.  Agree with discontinuing patient's home metoprolol.  Very likely inaccurate measurement by PCP office as EKG there (in Epic) and here both show relatively normal heart rate.  If becomes profoundly bradycardic would need cardiology input.  Check TSH  2. Fatigue/generalized malaise.  Has no localizing signs or symptoms of infection.  No new changes to medications.  Beta-blocker can cause fatigue in some cases may improve discontinuation.  Closely monitor.  Check TSH  3. Presumed early dementia.  Followed by neurology outpatient.  Continue Aricept.  Delirium precautions currently appropriate and conversant on exam.  4. Hypertension, at goal.  Holding home beta-blocker.  5. CAD.  History of multiple PCI's and CABG.  Asymptomatic.  Continue home DAPT.  6. BPH, stable, no changes in urinary symptoms, continue home Flomax.  Closely monitor output.  7. Hyperlipidemia, stable continue statin   Consultants: None Code Status: Full code discussed with patient and family on day of admission DVT Prophylaxis: Heparin Family Communication: Wife and son updated at bedside Disposition Plan: Admit as observation with close monitoring overnight with telemetry to monitor heart rate, check TSH.   Desiree Hane MD [facility]  Pager [phone]  If 7PM-7AM, please contact night-coverage [email or web address] Password [facility]  06/14/2018, 4:36 PM

## 2018-06-14 NOTE — Assessment & Plan Note (Signed)
stable overall by history and exam, recent data reviewed with pt, and pt to continue medical treatment as before,  to f/u any worsening symptoms or concerns  

## 2018-06-14 NOTE — Assessment & Plan Note (Addendum)
Pt with symptoms I suspect are related to intermittent bradycardia, to d/c the toprol, and refer to ED for further labs; likely should also consider CT or MRI brain given the slowed cognition and/or confusion  Note:  Total time for pt hx, exam, review of record with pt in the room, determination of diagnoses and plan for further eval and tx is > 40 min, with over 50% spent in coordination and counseling of patient including the differential dx, tx, further evaluation and other management of bradycardia, DM, HTN

## 2018-06-14 NOTE — ED Triage Notes (Signed)
Sent from dr's office, has been having confusion and weakness-- pulse was slow at office - in 30's per "pulse ox" . Son states that pt has had increasing confusion problems.

## 2018-06-14 NOTE — ED Provider Notes (Signed)
[facility] EMERGENCY DEPARTMENT Provider Note   CSN: [id number] Arrival date & time: 06/14/18  1154    History   Chief Complaint Chief Complaint  Patient presents with  . Bradycardia    HPI Sean Lozano is a 85 y.o. male.     HPI   Patient is an 85 year old male with a history of first-degree AV block, CAD, diabetes, hyperlipidemia, subdural hematoma, who presents the emergency department today for evaluation of bradycardia that was noted by his PCP prior to arrival.  Patient states that he was seen by his PCP today for his regular physical and was noted to have a pulse in the 30s on the pulse ox.  Son at bedside assist with history, he states pulse was taken on the left and right hand.  PCP then ran an EKG with showed a heart rate of 60 with first-degree AV block and no other changes.  Per the son, patient seen a little bit more confused recently and seems "out of it".  He does note that the patient has a history of mild cognitive impairment and has had difficulty with word finding and intermittent confusion for at least the past year.  Patient complains of generalized weakness and vague aches and pains but otherwise denies any chest pain, shortness of breath, lightheadedness/dizziness, abdominal pain, nausea, vomiting, diarrhea.  States he sometimes has difficulty urinating.  Fevers.  Son notes patient had fall 2 weeks ago.  He does not think that the patient hit his head.  Patient is on Plavix.  Past Medical History:  Diagnosis Date  . 1st degree AV block   . Arthritis   . Asthma   . BARRETTS ESOPHAGUS 06/02/2007  . Benign prostatic hypertrophy   . Bladder neck obstruction 09/29/2010  . CAD (coronary artery disease)    a. CABG x 6 in 1992. b. s/p DES x 2 to SVG -> OM1/OM2 in 09/2010. c. s/p DES to mLAD 10/2010. d. 12/2011 NSTEMI (off DAPT) s/p PTCA of ISR of DES in SVG-OM, Recs for lifelong DAPT   . Carotid artery disease ([place])    Doppler, May, 2014, 0-39% R.  ICA, 76-28% LICA, with recent syncope patient sent for consult with vascular surgery  . Carotid bruit    April, 2014  . Cervical disc disease   . Concussion July 09, 2012   Scalp  . Depression   . Diabetes mellitus    type II-diet  . Diverticulosis   . Ejection fraction    EF 60%, echo, June, 2012  . Fall at home July 09, 2012  . GERD (gastroesophageal reflux disease)   . Hearing loss 2014  . Hx of CABG    1992  . Hyperlipidemia    intolerance to Crestor Zocor Vytorin. He tolerates Pravachol...low HDL  . Paresthesias    successful surgery by Dr. Earnie Larsson  . Prostatitis   . Rosacea   . Sinus bradycardia    asymptomatic   . SKIN LESION 06/02/2007  . Subdural hematoma (HCC)    Subdural hematoma secondary to falling off a ladder  April, 2014,  medical therapy,  subdural did not expand while blood levels of ddual antiplatelet therapy decreased over one week.  Episode of chest pain in the hospital,, dual antiplatelet therapy restarted approximately one week after it was held  . Syncope    The patient lost consciousness and hit his head July 09 2012, not clear yet if he had true syncope    Patient  Active Problem List   Diagnosis Date Noted  . Bradycardia 06/14/2018  . Fatigue 06/14/2018  . Chest pain 12/02/2017  . Atypical chest pain 12/02/2017  . Epigastric pain   . Essential hypertension   . Fall   . Weakness due to acute stroke ([place]) 10/20/2017  . Stroke ([place]) 10/20/2017  . Elevated MCV 06/08/2017  . Back pain 06/08/2017  . Bilateral leg pain 12/01/2016  . Knee pain 09/19/2013  . Occlusion and stenosis of carotid artery without mention of cerebral infarction 08/22/2012  . Carotid artery disease ([place])   . Syncope   . Chronic diastolic congestive heart failure, NYHA class 1/ECHO 2010 07/10/2012  . SDH (subdural hematoma) ([place]) 07/09/2012  . Erectile dysfunction 04/01/2011  . Ejection fraction   . CAD (coronary artery disease)   . Hyperlipidemia   . Diabetes ([place])    . Hx of CABG   . Preventative health care 09/29/2010  . Dyspnea 09/29/2010  . Bladder neck obstruction 09/29/2010  . Depression 06/04/2007  . Asthma 06/04/2007  . GERD 06/04/2007  . BENIGN PROSTATIC HYPERTROPHY 06/04/2007  . BARRETTS ESOPHAGUS 06/02/2007  . Rosacea 06/02/2007  . SKIN LESION 06/02/2007  . FATIGUE 06/02/2007    Past Surgical History:  Procedure Laterality Date  . APPENDECTOMY    . Barretts Esophagus   20/20/09  . Bladder neck obstruction  09/29/10  . C-spine disc surgery  03/2006  . C4-5 surgery  01/2009  . CARDIAC CATHETERIZATION  July 2012   stent placed  X's  4  . COCCYX FRACTURE SURGERY  1997  . CORONARY ARTERY BYPASS GRAFT  1992  . ENDARTERECTOMY Left 09/07/2012   Procedure: ENDARTERECTOMY CAROTID with patch angioplasty;  Surgeon: Mal Misty, MD;  Location: [facility];  Service: Vascular;  Laterality: Left;  . LEFT HEART CATHETERIZATION WITH CORONARY/GRAFT ANGIOGRAM N/A 12/21/2011   Procedure: LEFT HEART CATHETERIZATION WITH Beatrix Fetters;  Surgeon: Hillary Bow, MD;  Location[facility] C[facility] LAB;  Service: Cardiovascular;  Laterality: N/A;  . TONSILLECTOMY          Home Medications    Prior to Admission medications   Medication Sig Start Date End Date Taking? Authorizing Provider  aspirin (ASPIRIN EC) 81 MG EC tablet Take 81 mg by mouth at bedtime.     [provider]  Calcium Carb-Cholecalciferol (CALCIUM 600+D3 PO) Take 1 tablet by mouth 2 (two) times daily.    [provider]  clopidogrel (PLAVIX) 75 MG tablet TAKE 1 TABLET BY MOUTH EVERY DAY 05/09/18   Sherren Mocha, MD  donepezil (ARICEPT) 10 MG tablet Take 1 tablet (10 mg total) by mouth at bedtime. 05/05/18   Pieter Partridge, DO  Influenza Vac A&B Surf Ant Adj (FLUAD) 0.5 ML SUSY Fluad 2019-20 44yr up(PF)45 mcg(15 mcgx3)/0.5 mL intramuscular syringe  ADM 0.5ML IM UTD    [provider]  metoprolol succinate (TOPROL-XL) 25 MG 24 hr tablet Take 0.5 tablets (12.5 mg  total) by mouth 2 (two) times daily. 05/16/17   Sherren Mocha, MD  nitroGLYCERIN (NITROSTAT) 0.4 MG SL tablet Place 1 tablet (0.4 mg total) under the tongue every 5 (five) minutes as needed for chest pain. 05/03/18   Sherren Mocha, MD  OVER THE COUNTER MEDICATION Take 2 tablets by mouth 2 (two) times daily. Focus factor brain health memory dietary supplement    [provider]  pantoprazole (PROTONIX) 40 MG tablet TAKE 1 TABLET BY MOUTH EVERY DAY 05/25/17   Sherren Mocha, MD  polycarbophil (FIBERCON) 625 MG  tablet Take 625 mg by mouth 2 (two) times daily.    [provider]  pravastatin (PRAVACHOL) 40 MG tablet TAKE 1 TABLET BY MOUTH EVERY EVENING 06/13/18   Sherren Mocha, MD  tamsulosin [facility]) 0.4 MG CAPS capsule TAKE 1 CAPSULE (0.4 MG TOTAL) BY MOUTH DAILY AFTER SUPPER. 05/15/18   Biagio Borg, MD    Family History Family History  Problem Relation Age of Onset  . Heart disease Father        mother  . Heart attack Father   . Diabetes Sister        mother  . Kidney disease Sister   . Hypertension Son   . Hyperlipidemia Son   . Hypertension Son   . Diabetes Mother   . Heart disease Mother   . Diabetes Son   . Aneurysm Other   . Hyperlipidemia Other   . Liver cancer Other        grandfather    Social History Social History   Tobacco Use  . Smoking status: Former Smoker    Types: Cigarettes    Start date: 01/03/1955  . Smokeless tobacco: Never Used  Substance Use Topics  . Alcohol use: Yes    Comment: occ  . Drug use: No     Allergies   Atenolol; Rosuvastatin; Simvastatin; Ultram [tramadol]; Zofran [ondansetron hcl]; Adhesive [tape]; Codeine; and Ezetimibe-simvastatin   Review of Systems Review of Systems  Constitutional: Negative for fever.  HENT: Negative for sore throat.   Eyes: Negative for visual disturbance.  Respiratory: Negative for cough and shortness of breath.   Cardiovascular: Negative for chest pain.       Bradycardia    Gastrointestinal: Negative for abdominal pain, constipation, diarrhea, nausea and vomiting.  Genitourinary: Negative for dysuria and hematuria.  Musculoskeletal: Negative for back pain.  Skin: Negative for rash.  Neurological: Negative for dizziness, weakness, light-headedness, numbness and headaches.       Confusion  All other systems reviewed and are negative.  Physical Exam Updated Vital Signs BP 123/65   Pulse (!) 49   Temp 97.7 F (36.5 C) (Oral)   Resp 13   SpO2 94%   Physical Exam Vitals signs and nursing note reviewed.  Constitutional:      Appearance: He is well-developed.  HENT:     Head: Normocephalic and atraumatic.  Eyes:     Conjunctiva/sclera: Conjunctivae normal.  Neck:     Musculoskeletal: Neck supple.  Cardiovascular:     Rate and Rhythm: Normal rate and regular rhythm.     Heart sounds: No murmur.     Comments: HR 50s-80s on monitor with intermittent ectopic beats Pulmonary:     Effort: Pulmonary effort is normal. No respiratory distress.     Breath sounds: Normal breath sounds.  Abdominal:     Palpations: Abdomen is soft.     Tenderness: There is no abdominal tenderness.  Skin:    General: Skin is warm and dry.  Neurological:     Mental Status: He is alert.     Comments: Mental Status:  Alert, thought content appropriate, able to give a coherent history. Speech fluent without evidence of aphasia. Able to follow 2 step commands without difficulty.  Cranial Nerves:  II: pupils equal, round, reactive to light III,IV, VI: ptosis not present, extra-ocular motions intact bilaterally  V,VII: smile symmetric, facial light touch sensation equal VIII: hearing grossly normal to voice  X: uvula elevates symmetrically  XI: bilateral shoulder shrug symmetric and strong XII:  midline tongue extension without fassiculations Motor:  Normal tone. 5/5 strength of BUE and BLE major muscle groups including strong and equal grip strength and dorsiflexion/plantar  flexion Sensory: light touch normal in all extremities. Cerebellar: normal finger-to-nose with bilateral upper extremities CV: 2+ radial and DP/PT pulses    ED Treatments / Results  Labs (all labs ordered are listed, but only abnormal results are displayed) Labs Reviewed  BASIC METABOLIC PANEL - Abnormal; Notable for the following components:      Result Value   GFR calc non Af Amer 55 (*)    All other components within normal limits  CBC - Abnormal; Notable for the following components:   WBC 11.0 (*)    MCV 103.1 (*)    MCH 34.9 (*)    All other components within normal limits  URINALYSIS, ROUTINE W REFLEX MICROSCOPIC - Abnormal; Notable for the following components:   APPearance HAZY (*)    All other components within normal limits  MAGNESIUM  I-STAT TROPONIN, ED    EKG EKG Interpretation  Date/Time:  Wednesday June 14 2018 12:01:47 EST Ventricular Rate:  58 PR Interval:  302 QRS Duration: 92 QT Interval:  422 QTC Calculation: 414 R Axis:   -6 Text Interpretation:  Sinus bradycardia with 1st degree A-V block with occasional Premature ventricular complexes Otherwise normal ECG no significant change since Aug 2019 Confirmed by Sherwood Gambler [phone]) on 06/14/2018 2:05:40 PM   Radiology Dg Chest 2 View  Result Date: 06/14/2018 CLINICAL DATA:  Acute chest pain EXAM: CHEST - 2 VIEW COMPARISON:  12/02/2017 and prior radiographs FINDINGS: The cardiomediastinal silhouette is unremarkable. CABG changes again noted. There is no evidence of focal airspace disease, pulmonary edema, suspicious pulmonary nodule/mass, pleural effusion, or pneumothorax. No acute bony abnormalities are identified. IMPRESSION: No active cardiopulmonary disease. Electronically Signed   By: Margarette Canada M.D.   On: 06/14/2018 13:28   Ct Head Wo Contrast  Result Date: 06/14/2018 CLINICAL DATA:  Increasing confusion and weakness EXAM: CT HEAD WITHOUT CONTRAST TECHNIQUE: Contiguous axial images were obtained  from the base of the skull through the vertex without intravenous contrast. COMPARISON:  09/01/2015 head CT FINDINGS: BRAIN: There is age related involutional changes of the brain with mild sulcal and ventricular prominence. No intraparenchymal hemorrhage, mass effect nor midline shift. Periventricular and subcortical white matter hypodensities consistent with mild-to-moderate chronic small vessel ischemic disease are identified. No acute large vascular territory infarcts. No abnormal extra-axial fluid collections. Basal cisterns are not effaced and midline. Brainstem and cerebellum are nonacute. VASCULAR: Moderate calcific atherosclerosis of the carotid siphons and distal vertebral arteries. SKULL: No skull fracture. No significant scalp soft tissue swelling. SINUSES/ORBITS: The mastoid air-cells are clear. The included paranasal sinuses are well-aerated.The included ocular globes and orbital contents are non-suspicious. OTHER: None. IMPRESSION: Atrophy with chronic microvascular ischemia. No acute intracranial abnormality. Electronically Signed   By: Ashley Royalty M.D.   On: 06/14/2018 16:10    Procedures Procedures (including critical care time)  Medications Ordered in ED Medications  sodium chloride flush (NS) 0.9 % injection 3 mL (has no administration in time range)     Initial Impression / Assessment and Plan / ED Course  I have reviewed the triage vital signs and the nursing notes.  Pertinent labs & imaging results that were available during my care of the patient were reviewed by me and considered in my medical decision making (see chart for details).      Final Clinical Impressions(s) / ED Diagnoses  Final diagnoses:  Bradycardia   Patient presenting the ER today for evaluation of bradycardia after being seen at PCP office prior to arrival where heart rate was noted to be in the 30s on the pulse ox.  EKG in the office showed a heart rate of 60 with first-degree AV block.  CBC with  slightly elevated white blood cell count at 11 which is nonspecific.  No anemia BMP no electrolyte derangement.  Normal kidney function Trop negative Magnesium is normal  Sinus bradycardia with 1st degree A-V block with occasional Premature ventricular complexes Otherwise normal ECG no significant change since Aug 2019  Chest x-ray without acute cardiopulmonary disease.  CT head atrophy with chronic microvascular ischemia. No acute intracranial abnormality.  Patient was seen in conjunction with my supervising physician, Dr. Regenia Skeeter who personally evaluated the patient and recommends admission for observation in setting of bradycardia.  Case discussed with Dr. Lonny Prude who accepts the patient for admission.  ED Discharge Orders    None       Rodney Booze, [place] 06/14/18 1653    Sherwood Gambler, MD 06/26/18 [phone]

## 2018-06-14 NOTE — Progress Notes (Signed)
Subjective:    Patient ID: Sean Lozano, male    DOB: [birth year], 85 y.o.   MRN: [id number]  HPI  Here with son, with c/o 3 days unusual cognitive slowing, confusion, general weakness and "Doesn't feel right" , but hard to pin down on any other abnormal ROS.  Pt denies chest pain, increased sob or doe, wheezing, orthopnea, PND, increased LE swelling, palpitations, dizziness or syncope.  Pt denies new neurological symptoms such as new headache, or facial or extremity weakness or numbness   Pt denies polydipsia, polyuria and  No other obvious symptoms, no fever, dysuria , diarhea, ST or cough  HR was noted at low 30's to both hands by oximetry on presentation to the office this am, but ECG in f/u after > 5 min was c/w HR 60.    No falls Past Medical History:  Diagnosis Date  . 1st degree AV block   . Arthritis   . Asthma   . BARRETTS ESOPHAGUS 06/02/2007  . Benign prostatic hypertrophy   . Bladder neck obstruction 09/29/2010  . CAD (coronary artery disease)    a. CABG x 6 in 1992. b. s/p DES x 2 to SVG -> OM1/OM2 in 09/2010. c. s/p DES to mLAD 10/2010. d. 12/2011 NSTEMI (off DAPT) s/p PTCA of ISR of DES in SVG-OM, Recs for lifelong DAPT   . Carotid artery disease ([place])    Doppler, May, 2014, 7-34% R. ICA, 28-76% LICA, with recent syncope patient sent for consult with vascular surgery  . Carotid bruit    April, 2014  . Cervical disc disease   . Concussion July 09, 2012   Scalp  . Depression   . Diabetes mellitus    type II-diet  . Diverticulosis   . Ejection fraction    EF 60%, echo, June, 2012  . Fall at home July 09, 2012  . GERD (gastroesophageal reflux disease)   . Hearing loss 2014  . Hx of CABG    1992  . Hyperlipidemia    intolerance to Crestor Zocor Vytorin. He tolerates Pravachol...low HDL  . Paresthesias    successful surgery by Dr. Earnie Larsson  . Prostatitis   . Rosacea   . Sinus bradycardia    asymptomatic   . SKIN LESION 06/02/2007  . Subdural hematoma ([facility])    Subdural hematoma secondary to falling off a ladder  April, 2014,  medical therapy,  subdural did not expand while blood levels of ddual antiplatelet therapy decreased over one week.  Episode of chest pain in the hospital,, dual antiplatelet therapy restarted approximately one week after it was held  . Syncope    The patient lost consciousness and hit his head July 09 2012, not clear yet if he had true syncope   Past Surgical History:  Procedure Laterality Date  . APPENDECTOMY    . Barretts Esophagus   20/20/09  . Bladder neck obstruction  09/29/10  . C-spine disc surgery  03/2006  . C4-5 surgery  01/2009  . CARDIAC CATHETERIZATION  July 2012   stent placed  X's  4  . COCCYX FRACTURE SURGERY  1997  . CORONARY ARTERY BYPASS GRAFT  1992  . ENDARTERECTOMY Left 09/07/2012   Procedure: ENDARTERECTOMY CAROTID with patch angioplasty;  Surgeon: Mal Misty, MD;  Location: [facility];  Service: Vascular;  Laterality: Left;  . LEFT HEART CATHETERIZATION WITH CORONARY/GRAFT ANGIOGRAM N/A 12/21/2011   Procedure: LEFT HEART CATHETERIZATION WITH Beatrix Fetters;  Surgeon: Hillary Bow, MD;  Location:  [facility] CATH LAB;  Service: Cardiovascular;  Laterality: N/A;  . TONSILLECTOMY      reports that he has quit smoking. His smoking use included cigarettes. He started smoking about 63 years ago. He has never used smokeless tobacco. He reports current alcohol use. He reports that he does not use drugs. family history includes Aneurysm in an other family member; Diabetes in his mother, sister, and son; Heart attack in his father; Heart disease in his father and mother; Hyperlipidemia in his son and another family member; Hypertension in his son and son; Kidney disease in his sister; Liver cancer in an other family member. Allergies  Allergen Reactions  . Atenolol Other (See Comments)    depression  . Rosuvastatin Other (See Comments)    Severe hip pain  . Simvastatin Other (See Comments)    Severe hip  pain  . Ultram [Tramadol] Nausea And Vomiting  . Zofran [Ondansetron Hcl] Nausea Only and Other (See Comments)    Severe nausea and constipation  . Adhesive [Tape] Other (See Comments)    Breakdown skin  . Codeine Other (See Comments)    Crazy feeling  . Ezetimibe-Simvastatin Other (See Comments)    Severe hip pain   No current facility-administered medications on file prior to visit.    Current Outpatient Medications on File Prior to Visit  Medication Sig Dispense Refill  . aspirin (ASPIRIN EC) 81 MG EC tablet Take 81 mg by mouth at bedtime.     . Calcium Carb-Cholecalciferol (CALCIUM 600+D3 PO) Take 1 tablet by mouth 2 (two) times daily.    . clopidogrel (PLAVIX) 75 MG tablet TAKE 1 TABLET BY MOUTH EVERY DAY 90 tablet 3  . donepezil (ARICEPT) 10 MG tablet Take 1 tablet (10 mg total) by mouth at bedtime. 30 tablet 5  . Influenza Vac A&B Surf Ant Adj (FLUAD) 0.5 ML SUSY Fluad 2019-20 90yr up(PF)45 mcg(15 mcgx3)/0.5 mL intramuscular syringe  ADM 0.5ML IM UTD    . metoprolol succinate (TOPROL-XL) 25 MG 24 hr tablet Take 0.5 tablets (12.5 mg total) by mouth 2 (two) times daily. 90 tablet 3  . nitroGLYCERIN (NITROSTAT) 0.4 MG SL tablet Place 1 tablet (0.4 mg total) under the tongue every 5 (five) minutes as needed for chest pain. 75 tablet 2  . OVER THE COUNTER MEDICATION Take 2 tablets by mouth 2 (two) times daily. Focus factor brain health memory dietary supplement    . pantoprazole (PROTONIX) 40 MG tablet TAKE 1 TABLET BY MOUTH EVERY DAY 90 tablet 3  . polycarbophil (FIBERCON) 625 MG tablet Take 625 mg by mouth 2 (two) times daily.    . pravastatin (PRAVACHOL) 40 MG tablet TAKE 1 TABLET BY MOUTH EVERY EVENING 90 tablet 3  . tamsulosin (FLOMAX) 0.4 MG CAPS capsule TAKE 1 CAPSULE (0.4 MG TOTAL) BY MOUTH DAILY AFTER SUPPER. 90 capsule 1   Review of Systems  Constitutional: Negative for other unusual diaphoresis or sweats HENT: Negative for ear discharge or swelling Eyes: Negative for  other worsening visual disturbances Respiratory: Negative for stridor or other swelling  Gastrointestinal: Negative for worsening distension or other blood Genitourinary: Negative for retention or other urinary change Musculoskeletal: Negative for other MSK pain or swelling Skin: Negative for color change or other new lesions Neurological: Negative for worsening tremors and other numbness  Psychiatric/Behavioral: Negative for worsening agitation or other fatigue All other system neg per pt    Objective:   Physical Exam BP 118/74   Pulse (!) 32   Temp 97.8  F (36.6 C) (Oral)   Ht 5\' 11"  (1.803 m)   Wt 200 lb (90.7 kg)   SpO2 96%   BMI 27.89 kg/m  VS noted, non toxic, but weak, slowed cognition somewhat over baseline Constitutional: Pt appears in NAD HENT: Head: NCAT.  Right Ear: External ear normal.  Left Ear: External ear normal.  Eyes: . Pupils are equal, round, and reactive to light. Conjunctivae and EOM are normal Nose: without d/c or deformity Neck: Neck supple. Gross normal ROM Cardiovascular: Normal rate and regular rhythm.   Pulmonary/Chest: Effort normal and breath sounds without rales or wheezing.  Abd:  Soft, NT, ND, + BS, no organomegaly Neurological: Pt is alert. At baseline orientation, motor grossly intact Skin: Skin is warm. No rashes, other new lesions, no LE edema Psychiatric: Pt behavior is normal without agitation  No other exam findings  ECG - SR, first degree block, pvc - I have personally intepreted this today Lab Results  Component Value Date   WBC 10.9 (H) 12/09/2017   HGB 16.0 12/09/2017   HCT 47.6 12/09/2017   PLT 226.0 12/09/2017   GLUCOSE 116 (H) 12/09/2017   CHOL 130 10/20/2017   TRIG 129.0 10/20/2017   HDL 36.80 (L) 10/20/2017   LDLDIRECT 87.6 09/19/2008   LDLCALC 67 10/20/2017   ALT 24 12/02/2017   AST 26 12/02/2017   NA 137 12/09/2017   K 3.8 12/09/2017   CL 101 12/09/2017   CREATININE 1.21 12/09/2017   BUN 13 12/09/2017   CO2  31 12/09/2017   TSH 1.947 12/02/2017   PSA 1.47 11/20/2013   INR 1.04 12/02/2017   HGBA1C 6.5 10/20/2017   MICROALBUR 0.7 05/24/2016       Assessment & Plan:

## 2018-06-14 NOTE — Patient Instructions (Signed)
You appear to have low heart rate that may come and go, even though the ECG today is "OK" at a HR of 60  Stop the metoprolol for now until told otherwise  Please continue all other medications as before, and refills have been done if requested.  Please have the pharmacy call with any other refills you may need.  Please keep your appointments with your specialists as you may have planned  Please go to ED now

## 2018-06-14 NOTE — ED Notes (Signed)
ED TO INPATIENT HANDOFF REPORT  ED Nurse Name and Phone #: [phone]  S Name/Age/Gender Sean Lozano 85 y.o. male Room/Bed: 027C/027C  Code Status   Code Status: Prior  Home/SNF/Other Home Patient oriented to: self, place, time and situation Is this baseline? Yes   Triage Complete: Triage complete  Chief Complaint brady/sent by dr  Triage Note Sent from dr's office, has been having confusion and weakness-- pulse was slow at office - in 30's per "pulse ox" . Son states that pt has had increasing confusion problems.    Allergies Allergies  Allergen Reactions  . Atenolol Other (See Comments)    depression  . Rosuvastatin Other (See Comments)    Severe hip pain  . Simvastatin Other (See Comments)    Severe hip pain  . Ultram [Tramadol] Nausea And Vomiting  . Zofran [Ondansetron Hcl] Nausea Only and Other (See Comments)    Severe nausea and constipation  . Adhesive [Tape] Other (See Comments)    Breakdown skin  . Codeine Other (See Comments)    Crazy feeling  . Ezetimibe-Simvastatin Other (See Comments)    Severe hip pain    Level of Care/Admitting Diagnosis ED Disposition    ED Disposition Condition [facility] Area: [place] [100100]  Level of Care: Medical Telemetry [104]  I expect the patient will be discharged within 24 hours: Yes  LOW acuity---Tx typically complete <24 hrs---ACUTE conditions typically can be evaluated <24 hours---LABS likely to return to acceptable levels <24 hours---IS near functional baseline---EXPECTED to return to current living arrangement---NOT newly hypoxic: Meets criteria for 5C-Observation unit  Diagnosis: Fatigue [361443]  Admitting Physician: Desiree Hane [[id number]]  Attending Physician: Desiree Hane [id number]  PT Class (Do Not Modify): Observation [104]  PT Acc Code (Do Not Modify): Observation [10022]       B Medical/Surgery History Past Medical History:  Diagnosis Date  . 1st  degree AV block   . Arthritis   . Asthma   . BARRETTS ESOPHAGUS 06/02/2007  . Benign prostatic hypertrophy   . Bladder neck obstruction 09/29/2010  . CAD (coronary artery disease)    a. CABG x 6 in 1992. b. s/p DES x 2 to SVG -> OM1/OM2 in 09/2010. c. s/p DES to mLAD 10/2010. d. 12/2011 NSTEMI (off DAPT) s/p PTCA of ISR of DES in SVG-OM, Recs for lifelong DAPT   . Carotid artery disease ([place])    Doppler, May, 2014, 3-26% R. ICA, 71-24% LICA, with recent syncope patient sent for consult with vascular surgery  . Carotid bruit    April, 2014  . Cervical disc disease   . Concussion July 09, 2012   Scalp  . Depression   . Diabetes mellitus    type II-diet  . Diverticulosis   . Ejection fraction    EF 60%, echo, June, 2012  . Fall at home July 09, 2012  . GERD (gastroesophageal reflux disease)   . Hearing loss 2014  . Hx of CABG    1992  . Hyperlipidemia    intolerance to Crestor Zocor Vytorin. He tolerates Pravachol...low HDL  . Paresthesias    successful surgery by Dr. Earnie Larsson  . Prostatitis   . Rosacea   . Sinus bradycardia    asymptomatic   . SKIN LESION 06/02/2007  . Subdural hematoma (HCC)    Subdural hematoma secondary to falling off a ladder  April, 2014,  medical therapy,  subdural did not expand while blood levels  of ddual antiplatelet therapy decreased over one week.  Episode of chest pain in the hospital,, dual antiplatelet therapy restarted approximately one week after it was held  . Syncope    The patient lost consciousness and hit his head July 09 2012, not clear yet if he had true syncope   Past Surgical History:  Procedure Laterality Date  . APPENDECTOMY    . Barretts Esophagus   20/20/09  . Bladder neck obstruction  09/29/10  . C-spine disc surgery  03/2006  . C4-5 surgery  01/2009  . CARDIAC CATHETERIZATION  July 2012   stent placed  X's  4  . COCCYX FRACTURE SURGERY  1997  . CORONARY ARTERY BYPASS GRAFT  1992  . ENDARTERECTOMY Left 09/07/2012    Procedure: ENDARTERECTOMY CAROTID with patch angioplasty;  Surgeon: Mal Misty, MD;  Location: [facility];  Service: Vascular;  Laterality: Left;  . LEFT HEART CATHETERIZATION WITH CORONARY/GRAFT ANGIOGRAM N/A 12/21/2011   Procedure: LEFT HEART CATHETERIZATION WITH Beatrix Fetters;  Surgeon: Hillary Bow, MD;  Location: [facility] CATH LAB;  Service: Cardiovascular;  Laterality: N/A;  . TONSILLECTOMY       A IV Location/Drains/Wounds Patient Lines/Drains/Airways Status   Active Line/Drains/Airways    Name:   Placement date:   Placement time:   Site:   Days:   Peripheral IV 06/14/18 Right Forearm   06/14/18    1649    Forearm   less than 1   Incision 09/07/12 Neck Left   09/07/12    1000     2106          Intake/Output Last 24 hours No intake or output data in the 24 hours ending 06/14/18 1738  Labs/Imaging Results for orders placed or performed during the hospital encounter of 06/14/18 (from the past 48 hour(s))  Basic metabolic panel     Status: Abnormal   Collection Time: 06/14/18 12:35 PM  Result Value Ref Range   Sodium 138 135 - 145 mmol/L   Potassium 4.0 3.5 - 5.1 mmol/L   Chloride 106 98 - 111 mmol/L   CO2 24 22 - 32 mmol/L   Glucose, Bld 90 70 - 99 mg/dL   BUN 13 8 - 23 mg/dL   Creatinine, Ser 1.21 0.61 - 1.24 mg/dL   Calcium 9.3 8.9 - 10.3 mg/dL   GFR calc non Af Amer 55 (L) >60 mL/min   GFR calc Af Amer >60 >60 mL/min   Anion gap 8 5 - 15    Comment: Performed at [facility], [address], [place], [place] [address]  CBC     Status: Abnormal   Collection Time: 06/14/18 12:35 PM  Result Value Ref Range   WBC 11.0 (H) 4.0 - 10.5 K/uL   RBC 4.56 4.22 - 5.81 MIL/uL   Hemoglobin 15.9 13.0 - 17.0 g/dL   HCT 47.0 39.0 - 52.0 %   MCV 103.1 (H) 80.0 - 100.0 fL   MCH 34.9 (H) 26.0 - 34.0 pg   MCHC 33.8 30.0 - 36.0 g/dL   RDW 12.9 11.5 - 15.5 %   Platelets 188 150 - 400 K/uL   nRBC 0.0 0.0 - 0.2 %    Comment: Performed at [facility], [place]  [address] [place], [place] [address]  I-stat troponin, ED     Status: None   Collection Time: 06/14/18 12:35 PM  Result Value Ref Range   Troponin i, poc 0.00 0.00 - 0.08 ng/mL   Comment  3            Comment: Due to the release kinetics of cTnI, a negative result within the first hours of the onset of symptoms does not rule out myocardial infarction with certainty. If myocardial infarction is still suspected, repeat the test at appropriate intervals.   Magnesium     Status: None   Collection Time: 06/14/18 12:35 PM  Result Value Ref Range   Magnesium 2.4 1.7 - 2.4 mg/dL    Comment: Performed at [facility], [place] [address], [place], [place] [address]  Urinalysis, Routine w reflex microscopic     Status: Abnormal   Collection Time: 06/14/18  3:34 PM  Result Value Ref Range   Color, Urine YELLOW YELLOW   APPearance HAZY (A) CLEAR   Specific Gravity, Urine 1.019 1.005 - 1.030   pH 5.0 5.0 - 8.0   Glucose, UA NEGATIVE NEGATIVE mg/dL   Hgb urine dipstick NEGATIVE NEGATIVE   Bilirubin Urine NEGATIVE NEGATIVE   Ketones, ur NEGATIVE NEGATIVE mg/dL   Protein, ur NEGATIVE NEGATIVE mg/dL   Nitrite NEGATIVE NEGATIVE   Leukocytes,Ua NEGATIVE NEGATIVE    Comment: Performed at [facility] [address], [place],  [address]   Dg Chest 2 View  Result Date: 06/14/2018 CLINICAL DATA:  Acute chest pain EXAM: CHEST - 2 VIEW COMPARISON:  12/02/2017 and prior radiographs FINDINGS: The cardiomediastinal silhouette is unremarkable. CABG changes again noted. There is no evidence of focal airspace disease, pulmonary edema, suspicious pulmonary nodule/mass, pleural effusion, or pneumothorax. No acute bony abnormalities are identified. IMPRESSION: No active cardiopulmonary disease. Electronically Signed   By: Margarette Canada M.D.   On: 06/14/2018 13:28   Ct Head Wo Contrast  Result Date: 06/14/2018 CLINICAL DATA:  Increasing confusion and weakness EXAM: CT HEAD WITHOUT CONTRAST TECHNIQUE:  Contiguous axial images were obtained from the base of the skull through the vertex without intravenous contrast. COMPARISON:  09/01/2015 head CT FINDINGS: BRAIN: There is age related involutional changes of the brain with mild sulcal and ventricular prominence. No intraparenchymal hemorrhage, mass effect nor midline shift. Periventricular and subcortical white matter hypodensities consistent with mild-to-moderate chronic small vessel ischemic disease are identified. No acute large vascular territory infarcts. No abnormal extra-axial fluid collections. Basal cisterns are not effaced and midline. Brainstem and cerebellum are nonacute. VASCULAR: Moderate calcific atherosclerosis of the carotid siphons and distal vertebral arteries. SKULL: No skull fracture. No significant scalp soft tissue swelling. SINUSES/ORBITS: The mastoid air-cells are clear. The included paranasal sinuses are well-aerated.The included ocular globes and orbital contents are non-suspicious. OTHER: None. IMPRESSION: Atrophy with chronic microvascular ischemia. No acute intracranial abnormality. Electronically Signed   By: Ashley Royalty M.D.   On: 06/14/2018 16:10    Pending Labs [employer] (From admission, onward)    Start     Ordered   Signed and Held  TSH  Add-on,   R     Signed and Held   Signed and Held  Basic metabolic panel  Tomorrow morning,   R     Signed and Held   Signed and Held  CBC  Tomorrow morning,   R     Signed and Held          Vitals/Pain Today's Vitals   06/14/18 1445 06/14/18 1600 06/14/18 1615 06/14/18 1630  BP: (!) 145/82 135/66 132/63 123/65  Pulse: (!) 59 (!) 53 (!) 50 (!) 49  Resp: (!) 21   13  Temp:  TempSrc:      SpO2: 96% 97% 95% 94%    Isolation Precautions No active isolations  Medications Medications  sodium chloride flush (NS) 0.9 % injection 3 mL (3 mLs Intravenous Given 06/14/18 1650)    Mobility walks with person assist     Focused Assessments Cardiac Assessment  Handoff:  Cardiac Rhythm: Normal sinus rhythm Lab Results  Component Value Date   TROPONINI <0.03 12/03/2017   No results found for: DDIMER Does the Patient currently have chest pain? No     R Recommendations: See Admitting Provider Note  Report given to:   Additional Notes: AV HB type 1. Alert and oriented x4. NO CP or SOB.

## 2018-06-15 ENCOUNTER — Telehealth: Payer: Self-pay | Admitting: *Deleted

## 2018-06-15 DIAGNOSIS — R001 Bradycardia, unspecified: Secondary | ICD-10-CM | POA: Diagnosis not present

## 2018-06-15 DIAGNOSIS — R5383 Other fatigue: Secondary | ICD-10-CM | POA: Diagnosis not present

## 2018-06-15 DIAGNOSIS — Z951 Presence of aortocoronary bypass graft: Secondary | ICD-10-CM | POA: Diagnosis not present

## 2018-06-15 DIAGNOSIS — I25810 Atherosclerosis of coronary artery bypass graft(s) without angina pectoris: Secondary | ICD-10-CM | POA: Diagnosis not present

## 2018-06-15 LAB — BASIC METABOLIC PANEL
Anion gap: 10 (ref 5–15)
BUN: 10 mg/dL (ref 8–23)
CO2: 22 mmol/L (ref 22–32)
Calcium: 8.8 mg/dL — ABNORMAL LOW (ref 8.9–10.3)
Chloride: 104 mmol/L (ref 98–111)
Creatinine, Ser: 1 mg/dL (ref 0.61–1.24)
GFR calc Af Amer: >60 mL/min (ref >60)
GFR calc non Af Amer: >60 mL/min (ref >60)
Glucose, Bld: 103 mg/dL — ABNORMAL HIGH (ref 70–99)
Potassium: 3.6 mmol/L (ref 3.5–5.1)
Sodium: 136 mmol/L (ref 135–145)

## 2018-06-15 LAB — CBC
HCT: 43.6 % (ref 39.0–52.0)
Hemoglobin: 14.7 g/dL (ref 13.0–17.0)
MCH: 33.7 pg (ref 26.0–34.0)
MCHC: 33.7 g/dL (ref 30.0–36.0)
MCV: 100 fL (ref 80.0–100.0)
Platelets: 155 10*3/uL (ref 150–400)
RBC: 4.36 MIL/uL (ref 4.22–5.81)
RDW: 12.5 % (ref 11.5–15.5)
WBC: 9.2 10*3/uL (ref 4.0–10.5)
nRBC: 0 % (ref 0.0–0.2)

## 2018-06-15 MED ORDER — AMLODIPINE BESYLATE 5 MG PO TABS: 5.0000 mg | ORAL_TABLET | Freq: Every day | ORAL | 0 refills | Status: DC

## 2018-06-15 NOTE — Discharge Summary (Signed)
PATIENT DETAILS Name: Sean Lozano Age: 85 y.o. Sex: male Date of Birth: 10-Jan-1934 MRN: [id number]. Admitting Physician: Desiree Hane, MD WEX:[id number], Hunt Oris, MD  Admit Date: 06/14/2018 Discharge date: 06/15/2018  Recommendations for Outpatient Follow-up:  1. Follow up with PCP in 1-2 weeks 2. Please obtain BMP/CBC in one week 3. Metoprolol on hold-reassess at next visit  Admitted From:  Home    Disposition: [place]: No  Equipment/Devices: None  Discharge Condition: Stable  CODE STATUS: FULL CODE  Diet recommendation:  Heart Healthy  Brief Summary: See H&P, Labs, Consult and Test reports for all details in brief, patient is a 85 year old male with history of CAD status post CABG and multiple PCI's, dementia, hypertension-who presented with 2-3-day history of fatigue and malaise, patient was evaluated by his PCP yesterday-and found to have bradycardia with heart rate in the 30s-(not seen on subsequent EKG)-and was subsequently admitted overnight for further observation.  Brief Hospital Course: ?  Bradycardia: Apparently bradycardic to the 30s at PCPs office-not confirmed on subsequent EKGs.  Heart rate mostly in the 50s at rest-completely asymptomatic.  TSH within normal limits.  Metoprolol remains on hold-reassess at next visit with PCP and cardiology.  Doubt further work-up required as bradycardia appears to be very mild.  Malaise/fatigue: No fever-stable vital signs overnight.  No foci of any infection apparent by history or by exam.  Continue to hold beta-blocker-see if symptoms improve.  TSH within normal limits.  Discussed with son/spouse at bedside-continue to monitor at home to see if he has fever or other localizing signs of infection.  Early dementia: Continue Aricept cautiously-if bradycardia worsens-may need to stop Aricept.  CAD: No anginal symptoms-continue dual antiplatelets.  Rest of the patient's medical problems were stable during the  short overnight hospital stay  Procedures/Studies: None  Discharge Diagnoses:  Active Problems:   Hyperlipidemia   Hx of CABG   CAD (coronary artery disease)   Knee pain   Bradycardia   Fatigue   Discharge Instructions:  Activity:  As tolerated with Full fall precautions use walker/cane & assistance as needed   Discharge Instructions    Diet - low sodium heart healthy   Complete by:  As directed    Discharge instructions   Complete by:  As directed    Follow with Primary MD  Biagio Borg, MD in 1 week  Please get a complete blood count and chemistry panel checked by your Primary MD at your next visit, and again as instructed by your Primary MD.  Get Medicines reviewed and adjusted: Please take all your medications with you for your next visit with your Primary MD  Laboratory/radiological data: Please request your Primary MD to go over all hospital tests and procedure/radiological results at the follow up, please ask your Primary MD to get all Hospital records sent to his/her office.  In some cases, they will be blood work, cultures and biopsy results pending at the time of your discharge. Please request that your primary care M.D. follows up on these results.  Also Note the following: If you experience worsening of your admission symptoms, develop shortness of breath, life threatening emergency, suicidal or homicidal thoughts you must seek medical attention immediately by calling 911 or calling your MD immediately  if symptoms less severe.  You must read complete instructions/literature along with all the possible adverse reactions/side effects for all the Medicines you take and that have been prescribed to you. Take any new Medicines after  you have completely understood and accpet all the possible adverse reactions/side effects.   Do not drive when taking Pain medications or sleeping medications (Benzodaizepines)  Do not take more than prescribed Pain, Sleep and Anxiety  Medications. It is not advisable to combine anxiety,sleep and pain medications without talking with your primary care practitioner  Special Instructions: If you have smoked or chewed Tobacco  in the last 2 yrs please stop smoking, stop any regular Alcohol  and or any Recreational drug use.  Wear Seat belts while driving.  Please note: You were cared for by a hospitalist during your hospital stay. Once you are discharged, your primary care physician will handle any further medical issues. Please note that NO REFILLS for any discharge medications will be authorized once you are discharged, as it is imperative that you return to your primary care physician (or establish a relationship with a primary care physician if you do not have one) for your post hospital discharge needs so that they can reassess your need for medications and monitor your lab values.   Increase activity slowly   Complete by:  As directed      Allergies as of 06/15/2018      Reactions   Atenolol Other (See Comments)   depression   Rosuvastatin Other (See Comments)   Severe hip pain   Simvastatin Other (See Comments)   Severe hip pain   Ultram [tramadol] Nausea And Vomiting   Zofran [ondansetron Hcl] Nausea Only, Other (See Comments)   Severe nausea and constipation   Adhesive [tape] Other (See Comments)   Breakdown skin   Codeine Other (See Comments)   Crazy feeling   Ezetimibe-simvastatin Other (See Comments)   Severe hip pain      Medication List    STOP taking these medications   metoprolol succinate 25 MG 24 hr tablet Commonly known as:  TOPROL-XL     TAKE these medications   amLODipine 5 MG tablet Commonly known as:  NORVASC Take 1 tablet (5 mg total) by mouth daily.   aspirin EC 81 MG EC tablet Generic drug:  aspirin Take 81 mg by mouth at bedtime.   CALCIUM 600+D3 PO Take 1 tablet by mouth 2 (two) times daily.   clopidogrel 75 MG tablet Commonly known as:  PLAVIX TAKE 1 TABLET BY MOUTH EVERY  DAY What changed:    when to take this  additional instructions   donepezil 10 MG tablet Commonly known as:  ARICEPT Take 1 tablet (10 mg total) by mouth at bedtime.   nitroGLYCERIN 0.4 MG SL tablet Commonly known as:  NITROSTAT Place 1 tablet (0.4 mg total) under the tongue every 5 (five) minutes as needed for chest pain.   OVER THE COUNTER MEDICATION Take 2 tablets by mouth 2 (two) times daily. Focus factor brain health memory dietary supplement   pantoprazole 40 MG tablet Commonly known as:  PROTONIX TAKE 1 TABLET BY MOUTH EVERY DAY   polycarbophil 625 MG tablet Commonly known as:  FIBERCON Take 625 mg by mouth 2 (two) times daily.   pravastatin 40 MG tablet Commonly known as:  PRAVACHOL TAKE 1 TABLET BY MOUTH EVERY EVENING What changed:  when to take this   tamsulosin 0.4 MG Caps capsule Commonly known as:  FLOMAX TAKE 1 CAPSULE (0.4 MG TOTAL) BY MOUTH DAILY AFTER SUPPER. What changed:  See the new instructions.      Follow-up Information    Biagio Borg, MD. Schedule an appointment as soon as  possible for a visit in 1 week(s).   Specialties:  Internal Medicine, Radiology Contact information: [address] [place] [address] [phone]        Sherren Mocha, MD .   Specialty:  Cardiology Contact information: [address] [place] [address] [phone]          Allergies  Allergen Reactions  . Atenolol Other (See Comments)    depression  . Rosuvastatin Other (See Comments)    Severe hip pain  . Simvastatin Other (See Comments)    Severe hip pain  . Ultram [Tramadol] Nausea And Vomiting  . Zofran [Ondansetron Hcl] Nausea Only and Other (See Comments)    Severe nausea and constipation  . Adhesive [Tape] Other (See Comments)    Breakdown skin  . Codeine Other (See Comments)    Crazy feeling  . Ezetimibe-Simvastatin Other (See Comments)    Severe hip pain    Consultations:   None  Other  Procedures/Studies: Dg Chest 2 View  Result Date: 06/14/2018 CLINICAL DATA:  Acute chest pain EXAM: CHEST - 2 VIEW COMPARISON:  12/02/2017 and prior radiographs FINDINGS: The cardiomediastinal silhouette is unremarkable. CABG changes again noted. There is no evidence of focal airspace disease, pulmonary edema, suspicious pulmonary nodule/mass, pleural effusion, or pneumothorax. No acute bony abnormalities are identified. IMPRESSION: No active cardiopulmonary disease. Electronically Signed   By: Margarette Canada M.D.   On: 06/14/2018 13:28   Ct Head Wo Contrast  Result Date: 06/14/2018 CLINICAL DATA:  Increasing confusion and weakness EXAM: CT HEAD WITHOUT CONTRAST TECHNIQUE: Contiguous axial images were obtained from the base of the skull through the vertex without intravenous contrast. COMPARISON:  09/01/2015 head CT FINDINGS: BRAIN: There is age related involutional changes of the brain with mild sulcal and ventricular prominence. No intraparenchymal hemorrhage, mass effect nor midline shift. Periventricular and subcortical white matter hypodensities consistent with mild-to-moderate chronic small vessel ischemic disease are identified. No acute large vascular territory infarcts. No abnormal extra-axial fluid collections. Basal cisterns are not effaced and midline. Brainstem and cerebellum are nonacute. VASCULAR: Moderate calcific atherosclerosis of the carotid siphons and distal vertebral arteries. SKULL: No skull fracture. No significant scalp soft tissue swelling. SINUSES/ORBITS: The mastoid air-cells are clear. The included paranasal sinuses are well-aerated.The included ocular globes and orbital contents are non-suspicious. OTHER: None. IMPRESSION: Atrophy with chronic microvascular ischemia. No acute intracranial abnormality. Electronically Signed   By: Ashley Royalty M.D.   On: 06/14/2018 16:10      TODAY-DAY OF DISCHARGE:  Subjective:   Darol Cush today has no headache,no chest abdominal pain,no  new weakness tingling or numbness, feels much better wants to go home today.   Objective:   Blood pressure 111/77, pulse 62, temperature 97.9 F (36.6 C), temperature source Oral, resp. rate 18, weight 88.2 kg, SpO2 97 %. No intake or output data in the 24 hours ending 06/15/18 0858 Filed Weights   06/15/18 0538  Weight: 88.2 kg    Exam: Awake Alert, Oriented *3, No new F.N deficits, Normal affect [place].AT,PERRAL Supple Neck,No JVD, No cervical lymphadenopathy appriciated.  Symmetrical Chest wall movement, Good air movement bilaterally, CTAB RRR,No Gallops,Rubs or new Murmurs, No Parasternal Heave +ve B.Sounds, Abd Soft, Non tender, No organomegaly appriciated, No rebound -guarding or rigidity. No Cyanosis, Clubbing or edema, No new Rash or bruise   PERTINENT RADIOLOGIC STUDIES: Dg Chest 2 View  Result Date: 06/14/2018 CLINICAL DATA:  Acute chest pain EXAM: CHEST - 2 VIEW COMPARISON:  12/02/2017 and prior radiographs FINDINGS: The cardiomediastinal silhouette is unremarkable. CABG changes again noted. There is no evidence of focal airspace disease, pulmonary edema, suspicious pulmonary nodule/mass, pleural effusion, or pneumothorax. No acute bony abnormalities are identified. IMPRESSION: No active cardiopulmonary disease. Electronically Signed   By: Margarette Canada M.D.   On: 06/14/2018 13:28   Ct Head Wo Contrast  Result Date: 06/14/2018 CLINICAL DATA:  Increasing confusion and weakness EXAM: CT HEAD WITHOUT CONTRAST TECHNIQUE: Contiguous axial images were obtained from the base of the skull through the vertex without intravenous contrast. COMPARISON:  09/01/2015 head CT FINDINGS: BRAIN: There is age related involutional changes of the brain with mild sulcal and ventricular prominence. No intraparenchymal hemorrhage, mass effect nor midline shift. Periventricular and subcortical white matter hypodensities consistent with mild-to-moderate chronic small vessel ischemic disease are identified. No  acute large vascular territory infarcts. No abnormal extra-axial fluid collections. Basal cisterns are not effaced and midline. Brainstem and cerebellum are nonacute. VASCULAR: Moderate calcific atherosclerosis of the carotid siphons and distal vertebral arteries. SKULL: No skull fracture. No significant scalp soft tissue swelling. SINUSES/ORBITS: The mastoid air-cells are clear. The included paranasal sinuses are well-aerated.The included ocular globes and orbital contents are non-suspicious. OTHER: None. IMPRESSION: Atrophy with chronic microvascular ischemia. No acute intracranial abnormality. Electronically Signed   By: Ashley Royalty M.D.   On: 06/14/2018 16:10     PERTINENT LAB RESULTS: CBC: Recent Labs    06/14/18 1235 06/15/18 0351  WBC 11.0* 9.2  HGB 15.9 14.7  HCT 47.0 43.6  PLT 188 155   CMET CMP     Component Value Date/Time   NA 136 06/15/2018 0351   K 3.6 06/15/2018 0351   CL 104 06/15/2018 0351   CO2 22 06/15/2018 0351   GLUCOSE 103 (H) 06/15/2018 0351   BUN 10 06/15/2018 0351   CREATININE 1.00 06/15/2018 0351   CALCIUM 8.8 (L) 06/15/2018 0351   PROT 6.5 12/02/2017 1407   ALBUMIN 3.7 12/02/2017 1407   AST 26 12/02/2017 1407   ALT 24 12/02/2017 1407   ALKPHOS 72 12/02/2017 1407   BILITOT 0.8 12/02/2017 1407   GFRNONAA >60 06/15/2018 0351   GFRAA >60 06/15/2018 0351    GFR Estimated Creatinine Clearance: 59.6 mL/min (by C-G formula based on SCr of 1 mg/dL). No results for input(s): LIPASE, AMYLASE in the last 72 hours. No results for input(s): CKTOTAL, CKMB, CKMBINDEX, TROPONINI in the last 72 hours. Invalid input(s): POCBNP No results for input(s): DDIMER in the last 72 hours. No results for input(s): HGBA1C in the last 72 hours. No results for input(s): CHOL, HDL, LDLCALC, TRIG, CHOLHDL, LDLDIRECT in the last 72 hours. Recent Labs    06/14/18 1827  TSH 2.323   No results for input(s): VITAMINB12, FOLATE, FERRITIN, TIBC, IRON, RETICCTPCT in the last 72  hours. Coags: No results for input(s): INR in the last 72 hours.  Invalid input(s): PT Microbiology: No results found for this or any previous visit (from the past 240 hour(s)).  FURTHER DISCHARGE INSTRUCTIONS:  Get Medicines reviewed and adjusted: Please take all your medications with you for your next visit with your Primary MD  Laboratory/radiological data: Please request your Primary MD to go over all hospital tests and procedure/radiological results at the follow up, please ask your Primary MD to get all Hospital records sent to his/her office.  In some cases, they will be blood work, cultures and biopsy results pending at the time of your discharge. Please request that your primary care  M.D. goes through all the records of your hospital data and follows up on these results.  Also Note the following: If you experience worsening of your admission symptoms, develop shortness of breath, life threatening emergency, suicidal or homicidal thoughts you must seek medical attention immediately by calling 911 or calling your MD immediately  if symptoms less severe.  You must read complete instructions/literature along with all the possible adverse reactions/side effects for all the Medicines you take and that have been prescribed to you. Take any new Medicines after you have completely understood and accpet all the possible adverse reactions/side effects.   Do not drive when taking Pain medications or sleeping medications (Benzodaizepines)  Do not take more than prescribed Pain, Sleep and Anxiety Medications. It is not advisable to combine anxiety,sleep and pain medications without talking with your primary care practitioner  Special Instructions: If you have smoked or chewed Tobacco  in the last 2 yrs please stop smoking, stop any regular Alcohol  and or any Recreational drug use.  Wear Seat belts while driving.  Please note: You were cared for by a hospitalist during your hospital stay.  Once you are discharged, your primary care physician will handle any further medical issues. Please note that NO REFILLS for any discharge medications will be authorized once you are discharged, as it is imperative that you return to your primary care physician (or establish a relationship with a primary care physician if you do not have one) for your post hospital discharge needs so that they can reassess your need for medications and monitor your lab values.  Total Time spent coordinating discharge including counseling, education and face to face time equals 35 minutes.  SignedOren Binet 06/15/2018 8:58 AM

## 2018-06-15 NOTE — Telephone Encounter (Signed)
Transition Care Management Follow-up Telephone Call   Date discharged? 06/15/18   How have you been since you were released from the hospital? Sean Lozano w/his son (Sean Lozano) he states they haven't been too long got in. Dad is right here sitting.. he states he feels alright   Do you understand why you were in the hospital? YES   Do you understand the discharge instructions? YES   Where were you discharged to? Home   Items Reviewed:  Medications reviewed: YES, will hold metoprolol until he see Sean Lozano  Allergies reviewed: YES  Dietary changes reviewed: YES, heart healthy  Referrals reviewed: No referral recommended, but son states he will call his cardiologist and make f/u appt w/them    Functional Questionnaire:   Activities of Daily Living (ADLs):   He states he are independent in the following: bathing and hygiene, feeding, continence, grooming, toileting and dressing States he require assistance with the following: ambulation   Any transportation issues/concerns?: NO   Any patient concerns? NO   Confirmed importance and date/time of follow-up visits scheduled YES, appt 06/22/18  Provider Appointment booked with Sean Lozano   Confirmed with patient if condition begins to worsen call PCP or go to the ER.  Patient was given the office number and encouraged to call back with question or concerns.  : YES

## 2018-06-15 NOTE — Plan of Care (Signed)

## 2018-06-22 ENCOUNTER — Ambulatory Visit: Payer: Medicare Other | Admitting: Internal Medicine

## 2018-06-22 ENCOUNTER — Encounter: Payer: Self-pay | Admitting: Internal Medicine

## 2018-06-22 ENCOUNTER — Other Ambulatory Visit: Payer: Self-pay

## 2018-06-22 VITALS — BP 124/70 | HR 72 | Temp 98.2°F | Ht 71.0 in | Wt 203.0 lb

## 2018-06-22 DIAGNOSIS — I10 Essential (primary) hypertension: Secondary | ICD-10-CM

## 2018-06-22 DIAGNOSIS — R001 Bradycardia, unspecified: Secondary | ICD-10-CM

## 2018-06-22 DIAGNOSIS — E119 Type 2 diabetes mellitus without complications: Secondary | ICD-10-CM

## 2018-06-22 NOTE — Assessment & Plan Note (Signed)
stable overall by history and exam, recent data reviewed with pt, and pt to continue medical treatment as before,  to f/u any worsening symptoms or concerns  

## 2018-06-22 NOTE — Assessment & Plan Note (Signed)
Improved, would not restart BB, currently doing well, no change in tx, f/u cardiology as planned

## 2018-06-22 NOTE — Patient Instructions (Signed)
Please continue all other medications as before, and refills have been done if requested.  Please have the pharmacy call with any other refills you may need.  Please continue your efforts at being more active, low cholesterol diet, and weight control.  You are otherwise up to date with prevention measures today.  Please keep your appointments with your specialists as you may have planned  Please return in 6 months, or sooner if needed 

## 2018-06-22 NOTE — Progress Notes (Signed)
Subjective:    Patient ID: Sean Lozano, male    DOB: [birth year], 85 y.o.   MRN: [id number]  HPI  Here to f/u with son and wife, post hospn 3/4 -3/5 with bradycardia, improved with stopping the BB and not restarted; tx with amlodipine 5 mg, Overall states stamina and mention improved to baseline and son is pleased.  Pt denies chest pain, increased sob or doe, wheezing, orthopnea, PND, increased LE swelling, palpitations, dizziness or syncope.  Pt denies new neurological symptoms such as new headache, or facial or extremity weakness or numbness   Pt denies polydipsia, polyuria.  To see cardiology Monday, still having 40's HR at night and first thing in the AM, but better the rest of the day and asymptomatic Past Medical History:  Diagnosis Date  . 1st degree AV block   . Arthritis   . Asthma   . BARRETTS ESOPHAGUS 06/02/2007  . Benign prostatic hypertrophy   . Bladder neck obstruction 09/29/2010  . CAD (coronary artery disease)    a. CABG x 6 in 1992. b. s/p DES x 2 to SVG -> OM1/OM2 in 09/2010. c. s/p DES to mLAD 10/2010. d. 12/2011 NSTEMI (off DAPT) s/p PTCA of ISR of DES in SVG-OM, Recs for lifelong DAPT   . Carotid artery disease ([place])    Doppler, May, 2014, 4-66% R. ICA, 59-93% LICA, with recent syncope patient sent for consult with vascular surgery  . Carotid bruit    April, 2014  . Cervical disc disease   . Concussion July 09, 2012   Scalp  . Depression   . Diabetes mellitus    type II-diet  . Diverticulosis   . Ejection fraction    EF 60%, echo, June, 2012  . Fall at home July 09, 2012  . GERD (gastroesophageal reflux disease)   . Hearing loss 2014  . Hx of CABG    1992  . Hyperlipidemia    intolerance to Crestor Zocor Vytorin. He tolerates Pravachol...low HDL  . Paresthesias    successful surgery by Dr. Earnie Larsson  . Prostatitis   . Rosacea   . Sinus bradycardia    asymptomatic   . SKIN LESION 06/02/2007  . Subdural hematoma ([facility])    Subdural hematoma secondary to  falling off a ladder  April, 2014,  medical therapy,  subdural did not expand while blood levels of ddual antiplatelet therapy decreased over one week.  Episode of chest pain in the hospital,, dual antiplatelet therapy restarted approximately one week after it was held  . Syncope    The patient lost consciousness and hit his head July 09 2012, not clear yet if he had true syncope   Past Surgical History:  Procedure Laterality Date  . APPENDECTOMY    . Barretts Esophagus   20/20/09  . Bladder neck obstruction  09/29/10  . C-spine disc surgery  03/2006  . C4-5 surgery  01/2009  . CARDIAC CATHETERIZATION  July 2012   stent placed  X's  4  . COCCYX FRACTURE SURGERY  1997  . CORONARY ARTERY BYPASS GRAFT  1992  . ENDARTERECTOMY Left 09/07/2012   Procedure: ENDARTERECTOMY CAROTID with patch angioplasty;  Surgeon: Mal Misty, MD;  Location: [facility];  Service: Vascular;  Laterality: Left;  . LEFT HEART CATHETERIZATION WITH CORONARY/GRAFT ANGIOGRAM N/A 12/21/2011   Procedure: LEFT HEART CATHETERIZATION WITH Beatrix Fetters;  Surgeon: Hillary Bow, MD;  Location[facility];  Service: Cardiovascular;  Laterality: N/A;  . TONSILLECTOMY  reports that he has quit smoking. His smoking use included cigarettes. He started smoking about 63 years ago. He has never used smokeless tobacco. He reports current alcohol use. He reports that he does not use drugs. family history includes Aneurysm in an other family member; Diabetes in his mother, sister, and son; Heart attack in his father; Heart disease in his father and mother; Hyperlipidemia in his son and another family member; Hypertension in his son and son; Kidney disease in his sister; Liver cancer in an other family member. Allergies  Allergen Reactions  . Atenolol Other (See Comments)    depression  . Rosuvastatin Other (See Comments)    Severe hip pain  . Simvastatin Other (See Comments)    Severe hip pain  . Ultram [Tramadol]  Nausea And Vomiting  . Zofran [Ondansetron Hcl] Nausea Only and Other (See Comments)    Severe nausea and constipation  . Adhesive [Tape] Other (See Comments)    Breakdown skin  . Codeine Other (See Comments)    Crazy feeling  . Ezetimibe-Simvastatin Other (See Comments)    Severe hip pain    Current Outpatient Medications on File Prior to Visit  Medication Sig Dispense Refill  . amLODipine (NORVASC) 5 MG tablet Take 1 tablet (5 mg total) by mouth daily. 30 tablet 0  . aspirin (ASPIRIN EC) 81 MG EC tablet Take 81 mg by mouth at bedtime.     . Calcium Carb-Cholecalciferol (CALCIUM 600+D3 PO) Take 1 tablet by mouth 2 (two) times daily.    . clopidogrel (PLAVIX) 75 MG tablet TAKE 1 TABLET BY MOUTH EVERY DAY (Patient taking differently: Take 75 mg by mouth at bedtime. TAKE 1 TABLET BY MOUTH EVERY DAY) 90 tablet 3  . donepezil (ARICEPT) 10 MG tablet Take 1 tablet (10 mg total) by mouth at bedtime. 30 tablet 5  . nitroGLYCERIN (NITROSTAT) 0.4 MG SL tablet Place 1 tablet (0.4 mg total) under the tongue every 5 (five) minutes as needed for chest pain. 75 tablet 2  . OVER THE COUNTER MEDICATION Take 2 tablets by mouth 2 (two) times daily. Focus factor brain health memory dietary supplement    . pantoprazole (PROTONIX) 40 MG tablet TAKE 1 TABLET BY MOUTH EVERY DAY (Patient taking differently: Take 40 mg by mouth daily. ) 90 tablet 3  . polycarbophil (FIBERCON) 625 MG tablet Take 625 mg by mouth 2 (two) times daily.    . pravastatin (PRAVACHOL) 40 MG tablet TAKE 1 TABLET BY MOUTH EVERY EVENING (Patient taking differently: Take 40 mg by mouth daily. ) 90 tablet 3  . tamsulosin (FLOMAX) 0.4 MG CAPS capsule TAKE 1 CAPSULE (0.4 MG TOTAL) BY MOUTH DAILY AFTER SUPPER. (Patient taking differently: Take 0.4 mg by mouth at bedtime. ) 90 capsule 1   No current facility-administered medications on file prior to visit.    Review of Systems  Constitutional: Negative for other unusual diaphoresis or sweats HENT:  Negative for ear discharge or swelling Eyes: Negative for other worsening visual disturbances Respiratory: Negative for stridor or other swelling  Gastrointestinal: Negative for worsening distension or other blood Genitourinary: Negative for retention or other urinary change Musculoskeletal: Negative for other MSK pain or swelling Skin: Negative for color change or other new lesions Neurological: Negative for worsening tremors and other numbness  Psychiatric/Behavioral: Negative for worsening agitation or other fatigue All other system neg per pt    Objective:   Physical Exam BP 124/70   Pulse 72   Temp 98.2 F (36.8 C) (  Oral)   Ht 5\' 11"  (1.803 m)   Wt 203 lb (92.1 kg)   SpO2 94%   BMI 28.31 kg/m  VS noted,  Constitutional: Pt appears in NAD HENT: Head: NCAT.  Right Ear: External ear normal.  Left Ear: External ear normal.  Eyes: . Pupils are equal, round, and reactive to light. Conjunctivae and EOM are normal Nose: without d/c or deformity Neck: Neck supple. Gross normal ROM Cardiovascular: Normal rate and regular rhythm.   Pulmonary/Chest: Effort normal and breath sounds without rales or wheezing.  Abd:  Soft, NT, ND, + BS, no organomegaly Neurological: Pt is alert. At baseline orientation, motor grossly intact Skin: Skin is warm. No rashes, other new lesions, no LE edema Psychiatric: Pt behavior is normal without agitation  No other exam findings Lab Results  Component Value Date   WBC 9.2 06/15/2018   HGB 14.7 06/15/2018   HCT 43.6 06/15/2018   PLT 155 06/15/2018   GLUCOSE 103 (H) 06/15/2018   CHOL 130 10/20/2017   TRIG 129.0 10/20/2017   HDL 36.80 (L) 10/20/2017   LDLDIRECT 87.6 09/19/2008   LDLCALC 67 10/20/2017   ALT 24 12/02/2017   AST 26 12/02/2017   NA 136 06/15/2018   K 3.6 06/15/2018   CL 104 06/15/2018   CREATININE 1.00 06/15/2018   BUN 10 06/15/2018   CO2 22 06/15/2018   TSH 2.323 06/14/2018   PSA 1.47 11/20/2013   INR 1.04 12/02/2017    HGBA1C 6.5 10/20/2017   MICROALBUR 0.7 05/24/2016          Assessment & Plan:

## 2018-06-26 ENCOUNTER — Encounter: Payer: Self-pay | Admitting: Physician Assistant

## 2018-06-26 ENCOUNTER — Other Ambulatory Visit: Payer: Self-pay

## 2018-06-26 ENCOUNTER — Ambulatory Visit: Payer: Medicare Other | Admitting: Physician Assistant

## 2018-06-26 VITALS — BP 128/68 | HR 58 | Ht 71.0 in | Wt 200.0 lb

## 2018-06-26 DIAGNOSIS — R001 Bradycardia, unspecified: Secondary | ICD-10-CM

## 2018-06-26 DIAGNOSIS — I2510 Atherosclerotic heart disease of native coronary artery without angina pectoris: Secondary | ICD-10-CM | POA: Diagnosis not present

## 2018-06-26 DIAGNOSIS — F0390 Unspecified dementia without behavioral disturbance: Secondary | ICD-10-CM

## 2018-06-26 DIAGNOSIS — I493 Ventricular premature depolarization: Secondary | ICD-10-CM | POA: Diagnosis not present

## 2018-06-26 DIAGNOSIS — I10 Essential (primary) hypertension: Secondary | ICD-10-CM | POA: Diagnosis not present

## 2018-06-26 MED ORDER — AMLODIPINE BESYLATE 5 MG PO TABS: 5.0000 mg | ORAL_TABLET | Freq: Every day | ORAL | 3 refills | Status: DC

## 2018-06-26 NOTE — Progress Notes (Signed)
Cardiology Office Note:    Date:  06/26/2018   ID:  Sean Lozano, DOB 08/24/1933, MRN [id number]  PCP:  Sean Borg, MD  Cardiologist:  Sean Mocha, MD   Electrophysiologist:  None  Neurologist:  Dr. Tomi Lozano  Referring MD: Sean Borg, MD   Chief Complaint  Patient presents with   Hospitalization Follow-up    bradycardia     History of Present Illness:    Sean Lozano is a 85 y.o. male with coronary artery disease status post CABG in 1992, stenting to the SVG-OM and LAD beyond the LIMA in July 2012 and non-ST elevation myocardial infarction in September 2013 secondary to in-stent restenosis of the SVG-OM treated with balloon angioplasty, carotid artery disease status post L CEA in 2014, subdural hematoma after a fall off of a ladder in 2014, hypertension, hyperlipidemia, diabetes, acid reflux disease.  He was last seen by Dr. Burt Lozano in January 2020.   He was admitted 3/4-3/5 with bradycardia.  Beta-blocker was stopped.  He saw Dr. Jenny Lozano in follow-up for primary care March 12 and his heart rate was much improved at 72.  Sean Lozano returns for post hospital follow up. He was placed on Aricept by Neurology in the recent past for dementia.  The dose was increased to 10 mg once daily in 04/2018.  When he was admitted to the hospital on 3/4, he presented to the clinic with increased confusion and malaise.  Those symptoms seem to be resolved with stopping Metoprolol.  He has not had syncope or near syncope.  He has not had palpitations.  He has some sensation in his chest at times but no anginal symptoms.  He has not had shortness of breath, orthopnea, paroxysmal nocturnal dyspnea, leg swelling.    Prior CV studies:   The following studies were reviewed today:  Carotid US 04/21/2018 Summary: Right Carotid: Velocities in the right ICA are consistent with a 1-39% stenosis,                upper end of range. Left Carotid: Velocities in the left ICA are consistent with a 1-39%  stenosis               status post carotid endarterectomy.  Echo 12/05/17 Mild conc LVH, EF 65-70, no RWMA, Gr 1 DD, mild TR, PASP 21, trivial pericardial eff  Cardiac Catheterization 12/21/11 Left mainstem: As on the prior study, there is disease of the LMCA leading into the AV circ.  There are tandem lesions of 60, 70, and 90 leading to the av circ.  The AV circ shows a small branch and the OMs are occluded.    Left anterior descending (LAD): totally occluded proximally  Left circumflex (LCx): see above  Right coronary artery (RCA): total occlusion proximally  SVG to the diagonal is occluded.  SVG to the distal RCA is occluded.  The SVG to the two OM branches has been stented in three locations.  The ostial stent has 30% narrowing.  The mid stent has 95% thrombotic in stent restenosis.  The distal stent to the second branch is widely patent.     The LIMA to the LAD is widely patent. It inserts to the diagonal LAD interface and fills both antegrade.  There is perhaps 30% narrowing just beyond the insertion. The stent site is widely patent and the distal LAD fills the PDA in a retrograde manner.  It also retrograde fills an RV branch  Left ventriculography: Left ventricular systolic  function is normal, LVEF is estimated at 55-65%, there is no significant mitral regurgitation   PCI Procedure Note:  Following the diagnostic procedure, the decision was made to proceed with PCI. The sheath was upsized to a 6 [place]. Weight-based bivalirudin was given for anticoagulation.  Clopidogrel was appropriately dosed.   Once a therapeutic ACT was achieved, a 6 [place] Amplatz AL1 guide catheter was inserted.  A prowater coronary guidewire was used to cross the lesion.  The lesion was predilated with a 2.0, then 3.0  balloon.   Dr. Burt Lozano and I discussed the case, and given the focal in stent restenosis, the vessel was not protected.  Verapamil was given as 200ug.   We then attempted to pass an 3.5  Expedition stent, but had difficulty getting it to traverse the ostial Promus Element stent from the last procedure.  The Guide was placed coaxially, and two wires, including a BMW wire, were placed, but still the stent would not pass easily through the shaft of the prior stent.  Given the propensity for longitudinal deformation with the Element stent, we then passed a 3.5 balloon down to the mid stent a did two long inflations with an excellent result.  I elected also to redilate the proximal stent in case of unsuspected deformation.   Following PCI, there was 0% residual stenosis and TIMI-3 flow. Final angiography confirmed an excellent result.  The patient tolerated the PCI procedure well. There were no immediate procedural complications.  The patient was transferred to the post catheterization recovery area for further monitoring.  PCI Data: Vessel - SVG to the OM 1-2/Segment - mid stent restenosis Percent Stenosis (pre)  95% TIMI-flow 2-3 Stent none Percent Stenosis (post) <10 TIMI-flow (post) 3   Final Conclusions:   1.  Successful PCI of instent renarrowing of a previously placed DES in SVG to the OM system 2.  Non STEMI  Recommendations:  1.  Resume DAPT 2.  Defer neck injections for the present.    Past Medical History:  Diagnosis Date   1st degree AV block    Arthritis    Asthma    BARRETTS ESOPHAGUS 06/02/2007   Benign prostatic hypertrophy    Bladder neck obstruction 09/29/2010   CAD (coronary artery disease)    a. CABG x 6 in 1992. b. s/p DES x 2 to SVG -> OM1/OM2 in 09/2010. c. s/p DES to mLAD 10/2010. d. 12/2011 NSTEMI (off DAPT) s/p PTCA of ISR of DES in SVG-OM, Recs for lifelong DAPT    Carotid artery disease ([place])    Doppler, May, 2014, 4-65% R. ICA, 68-12% LICA, with recent syncope patient sent for consult with vascular surgery   Carotid bruit    April, 2014   Cervical disc disease    Concussion July 09, 2012   Scalp   Depression    Diabetes  mellitus    type II-diet   Diverticulosis    Ejection fraction    EF 60%, echo, June, 2012   Fall at home July 09, 2012   GERD (gastroesophageal reflux disease)    Hearing loss 2014   Hx of CABG    1992   Hyperlipidemia    intolerance to Crestor Zocor Vytorin. He tolerates Pravachol...low HDL   Paresthesias    successful surgery by Dr. Earnie Larsson   Prostatitis    Rosacea    Sinus bradycardia    asymptomatic    SKIN LESION 06/02/2007   Subdural hematoma ([facility])    Subdural hematoma  secondary to falling off a ladder  April, 2014,  medical therapy,  subdural did not expand while blood levels of ddual antiplatelet therapy decreased over one week.  Episode of chest pain in the hospital,, dual antiplatelet therapy restarted approximately one week after it was held   Syncope    The patient lost consciousness and hit his head July 09 2012, not clear yet if he had true syncope   Surgical Hx: The patient  has a past surgical history that includes C-spine disc surgery (03/2006); C4-5 surgery (01/2009); Cardiac catheterization (July 2012); Appendectomy; Tonsillectomy; Coccyx fracture surgery (1997); Coronary artery bypass graft (1992); Barretts Esophagus  (20/20/09); Bladder neck obstruction (09/29/10); Endarterectomy (Left, 09/07/2012); and left heart catheterization with coronary/graft angiogram (N/A, 12/21/2011).   Current Medications: Current Meds  Medication Sig   amLODipine (NORVASC) 5 MG tablet Take 1 tablet (5 mg total) by mouth daily.   aspirin (ASPIRIN EC) 81 MG EC tablet Take 81 mg by mouth at bedtime.    Calcium Carb-Cholecalciferol (CALCIUM 600+D3 PO) Take 1 tablet by mouth 2 (two) times daily.   clopidogrel (PLAVIX) 75 MG tablet TAKE 1 TABLET BY MOUTH EVERY DAY (Patient taking differently: Take 75 mg by mouth at bedtime. TAKE 1 TABLET BY MOUTH EVERY DAY)   donepezil (ARICEPT) 10 MG tablet Take 1 tablet (10 mg total) by mouth at bedtime.   nitroGLYCERIN (NITROSTAT)  0.4 MG SL tablet Place 1 tablet (0.4 mg total) under the tongue every 5 (five) minutes as needed for chest pain.   OVER THE COUNTER MEDICATION Take 2 tablets by mouth 2 (two) times daily. Focus factor brain health memory dietary supplement   pantoprazole (PROTONIX) 40 MG tablet TAKE 1 TABLET BY MOUTH EVERY DAY (Patient taking differently: Take 40 mg by mouth daily. )   polycarbophil (FIBERCON) 625 MG tablet Take 625 mg by mouth 2 (two) times daily.   pravastatin (PRAVACHOL) 40 MG tablet TAKE 1 TABLET BY MOUTH EVERY EVENING (Patient taking differently: Take 40 mg by mouth daily. )   tamsulosin (FLOMAX) 0.4 MG CAPS capsule TAKE 1 CAPSULE (0.4 MG TOTAL) BY MOUTH DAILY AFTER SUPPER. (Patient taking differently: Take 0.4 mg by mouth at bedtime. )     Allergies:   Atenolol; Rosuvastatin; Simvastatin; Ultram [tramadol]; Zofran [ondansetron hcl]; Adhesive [tape]; Codeine; and Ezetimibe-simvastatin   Social History   Tobacco Use   Smoking status: Former Smoker    Types: Cigarettes    Start date: 01/03/1955   Smokeless tobacco: Never Used  Substance Use Topics   Alcohol use: Yes    Comment: occ   Drug use: No     Family Hx: The patient's family history includes Aneurysm in an other family member; Diabetes in his mother, sister, and son; Heart attack in his father; Heart disease in his father and mother; Hyperlipidemia in his son and another family member; Hypertension in his son and son; Kidney disease in his sister; Liver cancer in an other family member.  ROS:   Please see the history of present illness.    ROS All other systems reviewed and are negative.   EKGs/Labs/Other Test Reviewed:    EKG:  EKG is  ordered today.  The ekg ordered today demonstrates sinus brady, HR 58, PVCs in bigeminal pattern, QTc 418  Recent Labs: 12/02/2017: ALT 24; B Natriuretic Peptide 47.5 06/14/2018: Magnesium 2.4; TSH 2.323 06/15/2018: BUN 10; Creatinine, Ser 1.00; Hemoglobin 14.7; Platelets 155;  Potassium 3.6; Sodium 136   Recent Lipid Panel Lab Results  Component Value Date/Time   CHOL 130 10/20/2017 09:56 AM   TRIG 129.0 10/20/2017 09:56 AM   HDL 36.80 (L) 10/20/2017 09:56 AM   CHOLHDL 4 10/20/2017 09:56 AM   LDLCALC 67 10/20/2017 09:56 AM   LDLDIRECT 87.6 09/19/2008 03:46 PM    Physical Exam:    VS:  BP 128/68    Pulse (!) 58    Ht 5\' 11"  (1.803 m)    Wt 200 lb (90.7 kg)    SpO2 98%    BMI 27.89 kg/m     Wt Readings from Last 3 Encounters:  06/26/18 200 lb (90.7 kg)  06/22/18 203 lb (92.1 kg)  06/15/18 194 lb 7.1 oz (88.2 kg)     Physical Exam  Constitutional: He is oriented to person, place, and time. He appears well-developed and well-nourished. No distress.  HENT:  Head: Normocephalic and atraumatic.  Eyes: No scleral icterus.  Neck: Neck supple. No JVD present. No thyromegaly present.  Cardiovascular: Normal rate, S1 normal and S2 normal. An irregular rhythm present.  No murmur heard. Pulmonary/Chest: Breath sounds normal. He has no rales.  Abdominal: Soft. There is no hepatomegaly.  Musculoskeletal:        General: No edema.  Lymphadenopathy:    He has no cervical adenopathy.  Neurological: He is alert and oriented to person, place, and time.  Skin: Skin is warm and dry.  Psychiatric: He has a normal mood and affect.    ASSESSMENT & PLAN:    PVC's (premature ventricular contractions)  He seems to be asymptomatic.  His HR is slower and he has +PVCs when compared to ECGs in 2019 and 2018.  I will arrange an echocardiogram to rule out reduced LVF and a 24 Hr Holter to assess for PVC burden.  If his burden is high, will discuss with EP.    Bradycardia  HR seems stable now and he is not having any symptoms to suggest symptomatic bradycardia.  This is likely related to a combination of Aricept and Metoprolol.  He seems to feel better off of Metoprolol.  If he has significant pauses or slow HRs on Holter, we will likely need to have Dr. Tomi Lozano reduce his  Aricept or change to another agent.   Also, he is on Amlodipine now.  Although it is rare to contribute to bradycardia, it is possible.  We would have to DC this as well in that case.    Coronary artery disease involving native coronary artery of native heart without angina pectoris  Hx of CABG in 1992, stenting to the S-OM and LAD beyond the LIMA in 2012 and NSTEMI 2/2 stent restenosis in the S-OM which was tx with POBA.  He is doing well without angina.  Continue ASA, Plavix, statin.    Essential hypertension The patient's blood pressure is controlled on his current regimen.  Continue current therapy.    Dementia without behavioral disturbance, unspecified dementia type ([place])   As noted, if he has significant bradycardia or pauses on his monitor, we will need to see if Neurology can DC his Aricept.   Dispo:  Return in about 6 months (around 12/27/2018) for Routine Follow Up, w/ Dr. Burt Lozano.   Medication Adjustments/Labs and Tests Ordered: Current medicines are reviewed at length with the patient today.  Concerns regarding medicines are outlined above.  Tests Ordered: Orders Placed This Encounter  Procedures   Holter monitor - 24 hour   EKG 12-Lead   ECHOCARDIOGRAM COMPLETE   Medication Changes: Meds  ordered this encounter  Medications   amLODipine (NORVASC) 5 MG tablet    Sig: Take 1 tablet (5 mg total) by mouth daily.    Dispense:  90 tablet    Refill:  3    Order Specific Question:   Supervising Provider    Answer:   Thayer Headings [[id number]]    Signed, Richardson Dopp, PA-C  06/26/2018 4:44 PM    [facility] [address], [place], [place]  [address] Phone: [phone]; Fax: [phone]

## 2018-06-26 NOTE — Patient Instructions (Addendum)
Medication Instructions:   Your physician recommends that you continue on your current medications as directed. Please refer to the Current Medication list given to you today.   If you need a refill on your cardiac medications before your next appointment, please call your pharmacy.   Lab work: NONE ORDERED  TODAY   If you have labs (blood work) drawn today and your tests are completely normal, you will receive your results only by: Marland Kitchen MyChart Message (if you have MyChart) OR . A paper copy in the mail If you have any lab test that is abnormal or we need to change your treatment, we will call you to review the results.  Testing/Procedures: Your physician has requested that you have an echocardiogram. Echocardiography is a painless test that uses sound waves to create images of your heart. It provides your doctor with information about the size and shape of your heart and how well your heart's chambers and valves are working. This procedure takes approximately one hour. There are no restrictions for this procedure.  Your physician has recommended that you wear a holter monitor. Holter monitors are medical devices that record the heart's electrical activity. Doctors most often use these monitors to diagnose arrhythmias. Arrhythmias are problems with the speed or rhythm of the heartbeat. The monitor is a small, portable device. You can wear one while you do your normal daily activities. This is usually used to diagnose what is causing palpitations/syncope (passing out).  Follow-Up: At [facility], you and your health needs are our priority.  As part of our continuing mission to provide you with exceptional heart care, we have created designated Provider Care Teams.  These Care Teams include your primary Cardiologist (physician) and Advanced Practice Providers (APPs -  Physician Assistants and Nurse Practitioners) who all work together to provide you with the care you need, when you need it. You  will need a follow up appointment in:  6 months.  Please call our office 2 months in advance to schedule this appointment.  You may see Sherren Mocha, MD or one of the following Advanced Practice Providers on your designated Care Team: Richardson Dopp, PA-C [place], [place] . Daune Perch, NP    Any Other Special Instructions Will Be Listed Below (If Applicable).

## 2018-07-09 ENCOUNTER — Other Ambulatory Visit: Payer: Self-pay | Admitting: Cardiovascular Disease

## 2018-07-11 ENCOUNTER — Telehealth: Payer: Self-pay | Admitting: Cardiovascular Disease

## 2018-07-11 NOTE — Telephone Encounter (Signed)
Spoke with son to cancel echo 4/6 due to [facility] Virus ok to reschedule for 3 months

## 2018-07-17 ENCOUNTER — Other Ambulatory Visit (HOSPITAL_COMMUNITY): Payer: Medicare Other

## 2018-07-18 ENCOUNTER — Telehealth: Payer: Self-pay | Admitting: Radiology

## 2018-07-18 NOTE — Telephone Encounter (Signed)
Spoke to patients son. He agreeable to having the 3 day Zio monitor mailed to his dad. His Dad is in a nursing home but thinks someone there should be able to help him since he is unable to go now. Monitor has been enrolled and will be mailed to the patient

## 2018-08-16 ENCOUNTER — Telehealth: Payer: Self-pay | Admitting: Radiology

## 2018-08-16 NOTE — Telephone Encounter (Signed)
Received a call from [facility] monitor company today. They received the patients monitor back and there was no information on it. I reached out to the patient/his son and his son stated his dad is in a nursing home and tried to apply it but was unable to. He unable to help because of covid-19 and no one at the nursing home would help him. Patient son said his dad is doing well and they can try again later if needed

## 2018-08-16 NOTE — Telephone Encounter (Signed)
Ok.  We can reconsider in the future. Richardson Dopp, PA-C    08/16/2018 6:06 PM

## 2018-09-21 ENCOUNTER — Telehealth (HOSPITAL_COMMUNITY): Payer: Self-pay | Admitting: Radiology

## 2018-09-21 NOTE — Telephone Encounter (Signed)
Left message for patient to reschedule echocardiogram. Scheduler schedule outside of template we do not do echos at 450 pm.

## 2018-09-25 ENCOUNTER — Other Ambulatory Visit (HOSPITAL_COMMUNITY): Payer: Medicare Other

## 2018-10-02 ENCOUNTER — Telehealth (HOSPITAL_COMMUNITY): Payer: Self-pay

## 2018-10-02 NOTE — Telephone Encounter (Signed)
COVID-19 Pre-Screening Questions:  . Do you currently have a fever? no (yes = cancel and refer to pcp for e-visit) . Have you recently travelled on a cruise, internationally, or to [place], [place], [place], [place], [place], or [place], [place] [employer]) ? no (yes = cancel, stay home, monitor symptoms, and contact pcp or initiate e-visit if symptoms develop) . Have you been in contact with someone that is currently pending confirmation of Covid19 testing or has been confirmed to have the [place] virus?  no (yes = cancel, stay home, away from tested individual, monitor symptoms, and contact pcp or initiate e-visit if symptoms develop) . Are you currently experiencing fatigue or cough? No (yes = pt should be prepared to have a mask placed at the time of their visit). .  . Reiterated no additional visitors. Eartha Inch no earlier than 15 minutes before appointment time. . Please bring own mask.  Spoke with son per DPR.

## 2018-10-03 ENCOUNTER — Ambulatory Visit (HOSPITAL_COMMUNITY): Payer: Medicare Other | Attending: Internal Medicine

## 2018-10-03 ENCOUNTER — Other Ambulatory Visit: Payer: Self-pay

## 2018-10-03 DIAGNOSIS — I2510 Atherosclerotic heart disease of native coronary artery without angina pectoris: Secondary | ICD-10-CM | POA: Diagnosis present

## 2018-10-03 DIAGNOSIS — I493 Ventricular premature depolarization: Secondary | ICD-10-CM | POA: Diagnosis not present

## 2018-10-04 ENCOUNTER — Encounter: Payer: Self-pay | Admitting: Physician Assistant

## 2018-11-02 NOTE — Progress Notes (Signed)
NEUROLOGY FOLLOW UP OFFICE NOTE  Sean Lozano [id number]  HISTORY OF PRESENT ILLNESS: Sean Lozano is an 85 year old right-handed male with CAD status post CABG/and STEMI, diabetes mellitus, depression, carotid artery disease and hyperlipidemia who follows up for stroke and mild cognitive impairment.  He is accompanied by his son who supplements history.  UPDATE: Current medications:  Aricept 10mg , ASA, Plavix, pravastatin  He lives at [facility] with his wife.  His son sees him once a week.  His son manages his bills and medications.  He is independent in regards to dressing, bathing and using toilet.  Due to the pandemic, he is not driving.  He typically lets his son drive.  Earlier this year, he became bradycardic.  Metoprolol was switched to amlodipine and his cognition has since been improved.  HISTORY: In 2014, he began experiencing unexplained falls, one of them causing a concussion. Carotid doppler from May 2014 demonstrated severe left ICA stenosis of 80-99%. He underwent left carotid endarterectomy that month.   Since December 2018, he has had several more falls.Since he stopped Benadryl, tramadol and muscle relaxers, he has not had further falls. In June, he had an episode of slurred speech, word-finding difficulty, difficulty operating the TV remote, using the phone.Confusion has cleared but he continues to have word-finding anddifficulty using the remote. He has had trouble with managing medications. He drives minimally. He denies disorientation on familiar routes. In hindsight, his son thinks the confusion has been more gradual. He and his wife has since moved in to a group retirement home. Since June 2019, his son handles his bills out of precaution but he has been able to handle finances himself. Most bills are on autopay. He cares for his wife who has dementia. He was already on ASA, Plavix andstatin therapy. He saw his PCP two weeks later. EKG  looked okay. MRI of brain from 10/27/17 was personally reviewed and demonstrated moderate to severe chronic small vessel ischemic changes but no acute or subacute infarct, bleed, or mass lesion. Lipid panel from 10/20/17 showed total cholesterol 130, TG 129, HDL 36.80, and LDL 67. Hgb A1c was 6.5.   11/25/17:  B12 720, TSH 2.46 12/05/17 Echo with bubble study:  EF 65-70%.  No ASD, PFO or other cardiac source of emboli 04/21/18 Carotid doppler:  Bilateral 1-39% ICA stenosis s/p left CEA.  Vertebrals with antegrade flow.  His mother had Alzheimer's disease. She passed at 26. His father died at 40 from aortic dissection.   PAST MEDICAL HISTORY: Past Medical History:  Diagnosis Date  . 1st degree AV block   . Arthritis   . Asthma   . BARRETTS ESOPHAGUS 06/02/2007  . Benign prostatic hypertrophy   . Bladder neck obstruction 09/29/2010  . CAD (coronary artery disease)    a. CABG x 6 in 1992. b. s/p DES x 2 to SVG -> OM1/OM2 in 09/2010. c. s/p DES to mLAD 10/2010. d. 12/2011 NSTEMI (off DAPT) s/p PTCA of ISR of DES in SVG-OM, Recs for lifelong DAPT   . Carotid artery disease ([place])    Doppler, May, 2014, 6-14% R. ICA, 43-15% LICA, with recent syncope patient sent for consult with vascular surgery  . Carotid bruit    April, 2014  . Cervical disc disease   . Concussion July 09, 2012   Scalp  . Depression   . Diabetes mellitus    type II-diet  . Diverticulosis   . Echocardiogram    Echo 09/2018: EF 60-65, Gr 1  DD, mild AoV sclerosis, trivial TR  . Ejection fraction    EF 60%, echo, June, 2012  . Fall at home July 09, 2012  . GERD (gastroesophageal reflux disease)   . Hearing loss 2014  . Hx of CABG    1992  . Hyperlipidemia    intolerance to Crestor Zocor Vytorin. He tolerates Pravachol...low HDL  . Paresthesias    successful surgery by Dr. Earnie Larsson  . Prostatitis   . Rosacea   . Sinus bradycardia    asymptomatic   . SKIN LESION 06/02/2007  . Subdural hematoma (HCC)    Subdural  hematoma secondary to falling off a ladder  April, 2014,  medical therapy,  subdural did not expand while blood levels of ddual antiplatelet therapy decreased over one week.  Episode of chest pain in the hospital,, dual antiplatelet therapy restarted approximately one week after it was held  . Syncope    The patient lost consciousness and hit his head July 09 2012, not clear yet if he had true syncope    MEDICATIONS: Current Outpatient Medications on File Prior to Visit  Medication Sig Dispense Refill  . amLODipine (NORVASC) 5 MG tablet Take 1 tablet (5 mg total) by mouth daily. 90 tablet 3  . aspirin (ASPIRIN EC) 81 MG EC tablet Take 81 mg by mouth at bedtime.     . Calcium Carb-Cholecalciferol (CALCIUM 600+D3 PO) Take 1 tablet by mouth 2 (two) times daily.    . clopidogrel (PLAVIX) 75 MG tablet TAKE 1 TABLET BY MOUTH EVERY DAY (Patient taking differently: Take 75 mg by mouth at bedtime. TAKE 1 TABLET BY MOUTH EVERY DAY) 90 tablet 3  . donepezil (ARICEPT) 10 MG tablet Take 1 tablet (10 mg total) by mouth at bedtime. 30 tablet 5  . nitroGLYCERIN (NITROSTAT) 0.4 MG SL tablet Place 1 tablet (0.4 mg total) under the tongue every 5 (five) minutes as needed for chest pain. 75 tablet 2  . OVER THE COUNTER MEDICATION Take 2 tablets by mouth 2 (two) times daily. Focus factor brain health memory dietary supplement    . pantoprazole (PROTONIX) 40 MG tablet Take 1 tablet (40 mg total) by mouth daily. 90 tablet 3  . polycarbophil (FIBERCON) 625 MG tablet Take 625 mg by mouth 2 (two) times daily.    . pravastatin (PRAVACHOL) 40 MG tablet TAKE 1 TABLET BY MOUTH EVERY EVENING (Patient taking differently: Take 40 mg by mouth daily. ) 90 tablet 3  . tamsulosin (FLOMAX) 0.4 MG CAPS capsule TAKE 1 CAPSULE (0.4 MG TOTAL) BY MOUTH DAILY AFTER SUPPER. (Patient taking differently: Take 0.4 mg by mouth at bedtime. ) 90 capsule 1   No current facility-administered medications on file prior to visit.     ALLERGIES:  Allergies  Allergen Reactions  . Atenolol Other (See Comments)    depression  . Rosuvastatin Other (See Comments)    Severe hip pain  . Simvastatin Other (See Comments)    Severe hip pain  . Ultram [Tramadol] Nausea And Vomiting  . Zofran [Ondansetron Hcl] Nausea Only and Other (See Comments)    Severe nausea and constipation  . Adhesive [Tape] Other (See Comments)    Breakdown skin  . Codeine Other (See Comments)    Crazy feeling  . Ezetimibe-Simvastatin Other (See Comments)    Severe hip pain    FAMILY HISTORY: Family History  Problem Relation Age of Onset  . Heart disease Father        mother  . Heart  attack Father   . Diabetes Sister        mother  . Kidney disease Sister   . Hypertension Son   . Hyperlipidemia Son   . Hypertension Son   . Diabetes Mother   . Heart disease Mother   . Diabetes Son   . Aneurysm Other   . Hyperlipidemia Other   . Liver cancer Other        grandfather    SOCIAL HISTORY: Social History   Socioeconomic History  . Marital status: Married    Spouse name: Remo Lipps  . Number of children: 3  . Years of education: Not on file  . Highest education level: Bachelor's degree (e.g., BA, AB, BS)  Occupational History  . Occupation: retired-real estate  Social Needs  . Financial resource strain: Not hard at all  . Food insecurity    Worry: Never true    Inability: Never true  . Transportation needs    Medical: No    Non-medical: No  Tobacco Use  . Smoking status: Former Smoker    Types: Cigarettes    Start date: 01/03/1955  . Smokeless tobacco: Never Used  Substance and Sexual Activity  . Alcohol use: Yes    Comment: occ  . Drug use: No  . Sexual activity: Never  Lifestyle  . Physical activity    Days per week: 0 days    Minutes per session: 0 min  . Stress: To some extent  Relationships  . Social connections    Talks on phone: More than three times a week    Gets together: More than three times a week    Attends religious  service: More than 4 times per year    Active member of club or organization: Not on file    Attends meetings of clubs or organizations: More than 4 times per year    Relationship status: Married  . Intimate partner violence    Fear of current or ex partner: No    Emotionally abused: No    Physically abused: No    Forced sexual activity: No  Other Topics Concern  . Not on file  Social History Narrative      Primary caregiver for wife who has dementia.      Patient is right-handed. He and his wife live in [place] [facility]. He was recently discharged from P/T 2 x a week.     REVIEW OF SYSTEMS: Constitutional: No fevers, chills, or sweats, no generalized fatigue, change in appetite Eyes: No visual changes, double vision, eye pain Ear, nose and throat: No hearing loss, ear pain, nasal congestion, sore throat Cardiovascular: No chest pain, palpitations Respiratory:  No shortness of breath at rest or with exertion, wheezes GastrointestinaI: No nausea, vomiting, diarrhea, abdominal pain, fecal incontinence Genitourinary:  No dysuria, urinary retention or frequency Musculoskeletal:  No neck pain, back pain Integumentary: No rash, pruritus, skin lesions Neurological: as above Psychiatric: No depression, insomnia, anxiety Endocrine: No palpitations, fatigue, diaphoresis, mood swings, change in appetite, change in weight, increased thirst Hematologic/Lymphatic:  No purpura, petechiae. Allergic/Immunologic: no itchy/runny eyes, nasal congestion, recent allergic reactions, rashes  PHYSICAL EXAM: Blood pressure 113/69, pulse 61, temperature 98.9 F (37.2 C), height 5\' 11"  (1.803 m), weight 196 lb (88.9 kg), SpO2 94 %. General: No acute distress.  Patient appears well-groomed.   Head:  Normocephalic/atraumatic Eyes:  Fundi examined but not visualized Neurological Exam: alert and oriented to person, place, and time. Attention span and concentration impaired, recent  memory  impaired, remote memory intact, fund of knowledge intact.  Speech fluent and not dysarthric, language intact.  CN II-XII intact. Bulk and tone normal, muscle strength 5/5 throughout.  Sensation to light touch intact.  Deep tendon reflexes 2+ throughout, finger to nose negative.  Shuffling gait.   IMPRESSION: Dementia, likely vascular Carotid artery disease s/p L CEA HTN HLD CAD  PLAN: aricept 10mg  at bedtime Secondary stroke prevention Follow up in 6 months.  25 minutes spent face to face with patient, over 50% spent discussing diagnosis and management.  Sean Clines, DO  CC:  Cathlean Cower, MD

## 2018-11-03 ENCOUNTER — Ambulatory Visit (INDEPENDENT_AMBULATORY_CARE_PROVIDER_SITE_OTHER): Payer: Medicare Other | Admitting: Neurology

## 2018-11-03 ENCOUNTER — Encounter: Payer: Self-pay | Admitting: Neurology

## 2018-11-03 ENCOUNTER — Other Ambulatory Visit: Payer: Self-pay

## 2018-11-03 VITALS — BP 113/69 | HR 61 | Temp 98.9°F | Ht 71.0 in | Wt 196.0 lb

## 2018-11-03 DIAGNOSIS — I6523 Occlusion and stenosis of bilateral carotid arteries: Secondary | ICD-10-CM

## 2018-11-03 DIAGNOSIS — I25810 Atherosclerosis of coronary artery bypass graft(s) without angina pectoris: Secondary | ICD-10-CM | POA: Diagnosis not present

## 2018-11-03 DIAGNOSIS — F0150 Vascular dementia without behavioral disturbance: Secondary | ICD-10-CM | POA: Diagnosis not present

## 2018-11-03 DIAGNOSIS — E785 Hyperlipidemia, unspecified: Secondary | ICD-10-CM

## 2018-11-03 DIAGNOSIS — I679 Cerebrovascular disease, unspecified: Secondary | ICD-10-CM

## 2018-11-03 NOTE — Patient Instructions (Signed)
1.  Continue donepezil 10mg  at bedtime 2.  Follow up in 6 months.

## 2018-11-05 ENCOUNTER — Other Ambulatory Visit: Payer: Self-pay | Admitting: Neurology

## 2018-11-07 NOTE — Telephone Encounter (Signed)
Requested Prescriptions   Pending Prescriptions Disp Refills  . donepezil (ARICEPT) 10 MG tablet [Pharmacy Med Name: DONEPEZIL HCL 10 MG TABLET] 90 tablet 1    Sig: TAKE 1 TABLET BY MOUTH EVERYDAY AT BEDTIME   Rx last filled: 05/05/18 #30 5 refills  Pt last seen: 11/03/18   Follow up appt scheduled:05/11/2019

## 2018-12-17 ENCOUNTER — Other Ambulatory Visit: Payer: Self-pay | Admitting: Internal Medicine

## 2018-12-25 ENCOUNTER — Other Ambulatory Visit: Payer: Self-pay

## 2018-12-25 ENCOUNTER — Ambulatory Visit (INDEPENDENT_AMBULATORY_CARE_PROVIDER_SITE_OTHER): Payer: Medicare Other | Admitting: Internal Medicine

## 2018-12-25 ENCOUNTER — Encounter: Payer: Self-pay | Admitting: Internal Medicine

## 2018-12-25 ENCOUNTER — Other Ambulatory Visit (INDEPENDENT_AMBULATORY_CARE_PROVIDER_SITE_OTHER): Payer: Medicare Other

## 2018-12-25 VITALS — BP 114/78 | HR 67 | Temp 98.1°F | Ht 71.0 in | Wt 193.0 lb

## 2018-12-25 DIAGNOSIS — E559 Vitamin D deficiency, unspecified: Secondary | ICD-10-CM

## 2018-12-25 DIAGNOSIS — E785 Hyperlipidemia, unspecified: Secondary | ICD-10-CM

## 2018-12-25 DIAGNOSIS — E119 Type 2 diabetes mellitus without complications: Secondary | ICD-10-CM | POA: Diagnosis not present

## 2018-12-25 DIAGNOSIS — E538 Deficiency of other specified B group vitamins: Secondary | ICD-10-CM | POA: Diagnosis not present

## 2018-12-25 DIAGNOSIS — I10 Essential (primary) hypertension: Secondary | ICD-10-CM | POA: Diagnosis not present

## 2018-12-25 DIAGNOSIS — Z0000 Encounter for general adult medical examination without abnormal findings: Secondary | ICD-10-CM

## 2018-12-25 DIAGNOSIS — Z23 Encounter for immunization: Secondary | ICD-10-CM

## 2018-12-25 LAB — CBC WITH DIFFERENTIAL/PLATELET
Basophils Absolute: 0.1 10*3/uL (ref 0.0–0.1)
Basophils Relative: 1 % (ref 0.0–3.0)
Eosinophils Absolute: 0.3 10*3/uL (ref 0.0–0.7)
Eosinophils Relative: 2.3 % (ref 0.0–5.0)
HCT: 45.5 % (ref 39.0–52.0)
Hemoglobin: 15.3 g/dL (ref 13.0–17.0)
Lymphocytes Relative: 38.2 % (ref 12.0–46.0)
Lymphs Abs: 4.8 10*3/uL — ABNORMAL HIGH (ref 0.7–4.0)
MCHC: 33.7 g/dL (ref 30.0–36.0)
MCV: 102.1 fl — ABNORMAL HIGH (ref 78.0–100.0)
Monocytes Absolute: 1 10*3/uL (ref 0.1–1.0)
Monocytes Relative: 7.8 % (ref 3.0–12.0)
Neutro Abs: 6.4 10*3/uL (ref 1.4–7.7)
Neutrophils Relative %: 50.7 % (ref 43.0–77.0)
Platelets: 193 10*3/uL (ref 150.0–400.0)
RBC: 4.46 Mil/uL (ref 4.22–5.81)
RDW: 13.2 % (ref 11.5–15.5)
WBC: 12.7 10*3/uL — ABNORMAL HIGH (ref 4.0–10.5)

## 2018-12-25 NOTE — Progress Notes (Signed)
Subjective:    Patient ID: Sean Lozano, male    DOB: 08-16-1933, 85 y.o.   MRN: [id number]  HPI  Here for wellness and f/u;  Overall doing ok;  Pt denies Chest pain, worsening SOB, DOE, wheezing, orthopnea, PND, worsening LE edema, palpitations, dizziness or syncope.  Pt denies neurological change such as new headache, facial or extremity weakness.  Pt denies polydipsia, polyuria, or low sugar symptoms. Pt states overall good compliance with treatment and medications, good tolerability, and has been trying to follow appropriate diet.  Pt denies worsening depressive symptoms, suicidal ideation or panic. No fever, night sweats, wt loss, loss of appetite, or other constitutional symptoms.  Pt states good ability with ADL's, has low fall risk, home safety reviewed and adequate, no other significant changes in hearing or vision, and only occasionally active with exercise.  Doing much better with change of BB to amlodipine  Past Medical History:  Diagnosis Date  . 1st degree AV block   . Arthritis   . Asthma   . BARRETTS ESOPHAGUS 06/02/2007  . Benign prostatic hypertrophy   . Bladder neck obstruction 09/29/2010  . CAD (coronary artery disease)    a. CABG x 6 in 1992. b. s/p DES x 2 to SVG -> OM1/OM2 in 09/2010. c. s/p DES to mLAD 10/2010. d. 12/2011 NSTEMI (off DAPT) s/p PTCA of ISR of DES in SVG-OM, Recs for lifelong DAPT   . Carotid artery disease ([place])    Doppler, May, 2014, [id number] R. ICA, [id number] LICA, with recent syncope patient sent for consult with vascular surgery  . Carotid bruit    April, 2014  . Cervical disc disease   . Concussion July 09, 2012   Scalp  . Depression   . Diabetes mellitus    type II-diet  . Diverticulosis   . Echocardiogram    Echo 09/2018: EF 60-65, Gr 1 DD, mild AoV sclerosis, trivial TR  . Ejection fraction    EF 60%, echo, June, 2012  . Fall at home July 09, 2012  . GERD (gastroesophageal reflux disease)   . Hearing loss 2014  . Hx of CABG    1992  .  Hyperlipidemia    intolerance to Crestor Zocor Vytorin. He tolerates Pravachol...low HDL  . Paresthesias    successful surgery by Dr. Earnie Larsson  . Prostatitis   . Rosacea   . Sinus bradycardia    asymptomatic   . SKIN LESION 06/02/2007  . Subdural hematoma (HCC)    Subdural hematoma secondary to falling off a ladder  April, 2014,  medical therapy,  subdural did not expand while blood levels of ddual antiplatelet therapy decreased over one week.  Episode of chest pain in the hospital,, dual antiplatelet therapy restarted approximately one week after it was held  . Syncope    The patient lost consciousness and hit his head July 09 2012, not clear yet if he had true syncope   Past Surgical History:  Procedure Laterality Date  . APPENDECTOMY    . Barretts Esophagus   20/20/09  . Bladder neck obstruction  09/29/10  . C-spine disc surgery  03/2006  . C4-5 surgery  01/2009  . CARDIAC CATHETERIZATION  July 2012   stent placed  X's  4  . COCCYX FRACTURE SURGERY  1997  . CORONARY ARTERY BYPASS GRAFT  1992  . ENDARTERECTOMY Left 09/07/2012   Procedure: ENDARTERECTOMY CAROTID with patch angioplasty;  Surgeon: Mal Misty, MD;  Location: [facility];  Service:  Vascular;  Laterality: Left;  . LEFT HEART CATHETERIZATION WITH CORONARY/GRAFT ANGIOGRAM N/A 12/21/2011   Procedure: LEFT HEART CATHETERIZATION WITH Beatrix Fetters;  Surgeon: Hillary Bow, MD;  Location: [facility] CATH LAB;  Service: Cardiovascular;  Laterality: N/A;  . TONSILLECTOMY      reports that he has quit smoking. His smoking use included cigarettes. He started smoking about 64 years ago. He has never used smokeless tobacco. He reports current alcohol use. He reports that he does not use drugs. family history includes Aneurysm in an other family member; Diabetes in his mother, sister, and son; Heart attack in his father; Heart disease in his father and mother; Hyperlipidemia in his son and another family member; Hypertension in his  son and son; Kidney disease in his sister; Liver cancer in an other family member. Allergies  Allergen Reactions  . Atenolol Other (See Comments)    depression  . Rosuvastatin Other (See Comments)    Severe hip pain  . Simvastatin Other (See Comments)    Severe hip pain  . Ultram [Tramadol] Nausea And Vomiting  . Zofran [Ondansetron Hcl] Nausea Only and Other (See Comments)    Severe nausea and constipation  . Adhesive [Tape] Other (See Comments)    Breakdown skin  . Codeine Other (See Comments)    Crazy feeling  . Ezetimibe-Simvastatin Other (See Comments)    Severe hip pain   Current Outpatient Medications on File Prior to Visit  Medication Sig Dispense Refill  . amLODipine (NORVASC) 5 MG tablet Take 1 tablet (5 mg total) by mouth daily. 90 tablet 3  . aspirin (ASPIRIN EC) 81 MG EC tablet Take 81 mg by mouth at bedtime.     . Calcium Carb-Cholecalciferol (CALCIUM 600+D3 PO) Take 1 tablet by mouth 2 (two) times daily.    . clopidogrel (PLAVIX) 75 MG tablet TAKE 1 TABLET BY MOUTH EVERY DAY (Patient taking differently: Take 75 mg by mouth at bedtime. TAKE 1 TABLET BY MOUTH EVERY DAY) 90 tablet 3  . donepezil (ARICEPT) 10 MG tablet TAKE 1 TABLET BY MOUTH EVERYDAY AT BEDTIME 90 tablet 1  . nitroGLYCERIN (NITROSTAT) 0.4 MG SL tablet Place 1 tablet (0.4 mg total) under the tongue every 5 (five) minutes as needed for chest pain. 75 tablet 2  . OVER THE COUNTER MEDICATION Take 2 tablets by mouth 2 (two) times daily. Focus factor brain health memory dietary supplement    . pantoprazole (PROTONIX) 40 MG tablet Take 1 tablet (40 mg total) by mouth daily. 90 tablet 3  . polycarbophil (FIBERCON) 625 MG tablet Take 625 mg by mouth 2 (two) times daily.    . pravastatin (PRAVACHOL) 40 MG tablet TAKE 1 TABLET BY MOUTH EVERY EVENING (Patient taking differently: Take 40 mg by mouth daily. ) 90 tablet 3  . tamsulosin (FLOMAX) 0.4 MG CAPS capsule TAKE 1 CAPSULE (0.4 MG TOTAL) BY MOUTH DAILY AFTER SUPPER.  90 capsule 1   No current facility-administered medications on file prior to visit.    Review of Systems Constitutional: Negative for other unusual diaphoresis, sweats, appetite or weight changes HENT: Negative for other worsening hearing loss, ear pain, facial swelling, mouth sores or neck stiffness.   Eyes: Negative for other worsening pain, redness or other visual disturbance.  Respiratory: Negative for other stridor or swelling Cardiovascular: Negative for other palpitations or other chest pain  Gastrointestinal: Negative for worsening diarrhea or loose stools, blood in stool, distention or other pain Genitourinary: Negative for hematuria, flank pain or other change  in urine volume.  Musculoskeletal: Negative for myalgias or other joint swelling.  Skin: Negative for other color change, or other wound or worsening drainage.  Neurological: Negative for other syncope or numbness. Hematological: Negative for other adenopathy or swelling Psychiatric/Behavioral: Negative for hallucinations, other worsening agitation, SI, self-injury, or new decreased concentration All otherwise neg per pt     Objective:   Physical Exam BP 114/78   Pulse 67   Temp 98.1 F (36.7 C) (Oral)   Ht 5\' 11"  (1.803 m)   Wt 193 lb (87.5 kg)   SpO2 96%   BMI 26.92 kg/m  VS noted,  Constitutional: Pt is oriented to person, place, and time. Appears well-developed and well-nourished, in no significant distress and comfortable Head: Normocephalic and atraumatic  Eyes: Conjunctivae and EOM are normal. Pupils are equal, round, and reactive to light Right Ear: External ear normal without discharge Left Ear: External ear normal without discharge Nose: Nose without discharge or deformity Mouth/Throat: Oropharynx is without other ulcerations and moist  Neck: Normal range of motion. Neck supple. No JVD present. No tracheal deviation present or significant neck LA or mass Cardiovascular: Normal rate, regular rhythm,  normal heart sounds and intact distal pulses.   Pulmonary/Chest: WOB normal and breath sounds without rales or wheezing  Abdominal: Soft. Bowel sounds are normal. NT. No HSM  Musculoskeletal: Normal range of motion. Exhibits no edema Lymphadenopathy: Has no other cervical adenopathy.  Neurological: Pt is alert and oriented to person, place, and time. Pt has normal reflexes. No cranial nerve deficit. Motor grossly intact, Gait intact Skin: Skin is warm and dry. No rash noted or new ulcerations Psychiatric:  Has normal mood and affect. Behavior is normal without agitation All otherwise neg per pt  Lab Results  Component Value Date   WBC 9.2 06/15/2018   HGB 14.7 06/15/2018   HCT 43.6 06/15/2018   PLT 155 06/15/2018   GLUCOSE 103 (H) 06/15/2018   CHOL 130 10/20/2017   TRIG 129.0 10/20/2017   HDL 36.80 (L) 10/20/2017   LDLDIRECT 87.6 09/19/2008   LDLCALC 67 10/20/2017   ALT 24 12/02/2017   AST 26 12/02/2017   NA 136 06/15/2018   K 3.6 06/15/2018   CL 104 06/15/2018   CREATININE 1.00 06/15/2018   BUN 10 06/15/2018   CO2 22 06/15/2018   TSH 2.323 06/14/2018   PSA 1.47 11/20/2013   INR 1.04 12/02/2017   HGBA1C 6.5 10/20/2017   MICROALBUR 0.7 05/24/2016        Assessment & Plan:

## 2018-12-25 NOTE — Patient Instructions (Addendum)
You had the flu shot today, and the Tdap tetanus shot  Please continue all other medications as before, and refills have been done if requested.  Please have the pharmacy call with any other refills you may need.  Please continue your efforts at being more active, low cholesterol diet, and weight control.  You are otherwise up to date with prevention measures today.  Please keep your appointments with your specialists as you may have planned  Please go to the LAB in the Basement (turn left off the elevator) for the tests to be done today  You will be contacted by phone if any changes need to be made immediately.  Otherwise, you will receive a letter about your results with an explanation, but please check with MyChart first.  Please remember to sign up for MyChart if you have not done so, as this will be important to you in the future with finding out test results, communicating by private email, and scheduling acute appointments online when needed.  Please return in 6 months, or sooner if needed

## 2018-12-26 LAB — URINALYSIS, ROUTINE W REFLEX MICROSCOPIC
Bilirubin Urine: NEGATIVE
Hgb urine dipstick: NEGATIVE
Ketones, ur: NEGATIVE
Nitrite: NEGATIVE
RBC / HPF: NONE SEEN (ref 0–?)
Specific Gravity, Urine: 1.025 (ref 1.000–1.030)
Total Protein, Urine: NEGATIVE
Urine Glucose: NEGATIVE
Urobilinogen, UA: 1 (ref 0.0–1.0)
pH: 6 (ref 5.0–8.0)

## 2018-12-26 LAB — BASIC METABOLIC PANEL
BUN: 17 mg/dL (ref 6–23)
CO2: 28 mEq/L (ref 19–32)
Calcium: 9.6 mg/dL (ref 8.4–10.5)
Chloride: 102 mEq/L (ref 96–112)
Creatinine, Ser: 1.22 mg/dL (ref 0.40–1.50)
GFR: 56.64 mL/min — ABNORMAL LOW (ref >60.00)
Glucose, Bld: 101 mg/dL — ABNORMAL HIGH (ref 70–99)
Potassium: 4 mEq/L (ref 3.5–5.1)
Sodium: 138 mEq/L (ref 135–145)

## 2018-12-26 LAB — HEMOGLOBIN A1C: Hgb A1c MFr Bld: 6.2 % (ref 4.6–6.5)

## 2018-12-26 LAB — HEPATIC FUNCTION PANEL
ALT: 16 U/L (ref 0–53)
AST: 19 U/L (ref 0–37)
Albumin: 4.3 g/dL (ref 3.5–5.2)
Alkaline Phosphatase: 55 U/L (ref 39–117)
Bilirubin, Direct: 0.1 mg/dL (ref 0.0–0.3)
Total Bilirubin: 0.6 mg/dL (ref 0.2–1.2)
Total Protein: 7.3 g/dL (ref 6.0–8.3)

## 2018-12-26 LAB — LIPID PANEL
Cholesterol: 144 mg/dL (ref 0–200)
HDL: 36.4 mg/dL — ABNORMAL LOW (ref >39.00)
LDL Cholesterol: 77 mg/dL (ref 0–99)
NonHDL: 107.13
Total CHOL/HDL Ratio: 4
Triglycerides: 152 mg/dL — ABNORMAL HIGH (ref 0.0–149.0)
VLDL: 30.4 mg/dL (ref 0.0–40.0)

## 2018-12-26 LAB — MICROALBUMIN / CREATININE URINE RATIO
Creatinine,U: 215.3 mg/dL
Microalb Creat Ratio: 0.8 mg/g (ref 0.0–30.0)
Microalb, Ur: 1.8 mg/dL (ref 0.0–1.9)

## 2018-12-26 LAB — VITAMIN B12: Vitamin B-12: 587 pg/mL (ref 211–911)

## 2018-12-26 LAB — VITAMIN D 25 HYDROXY (VIT D DEFICIENCY, FRACTURES): VITD: 36.82 ng/mL (ref 30.00–100.00)

## 2018-12-26 LAB — TSH: TSH: 2.23 u[IU]/mL (ref 0.35–4.50)

## 2018-12-28 ENCOUNTER — Encounter: Payer: Self-pay | Admitting: Internal Medicine

## 2018-12-28 NOTE — Assessment & Plan Note (Signed)

## 2018-12-28 NOTE — Assessment & Plan Note (Signed)
stable overall by history and exam, recent data reviewed with pt, and pt to continue medical treatment as before,  to f/u any worsening symptoms or concerns  

## 2019-03-21 NOTE — Progress Notes (Signed)
Cardiology Office Note    Date:  03/22/2019   ID:  Sean Lozano, DOB 1933/09/28, MRN [id number]  PCP:  Sean Borg, MD  Cardiologist:  Dr. Burt Lozano   Chief Complaint: 9 Months follow up  History of Present Illness:   Sean Lozano is a 85 y.o. male with history of CAD and PCI, carotid artery disease s/p left CEA, hypertension, hyperlipidemia, diabetes, subdural hematoma after a fall off a ladder in 2014, bradycardia on beta-blocker and GERD presented for follow-up.  Longstanding history of CAD s/p CABG in 1992. He had stenting to the SVG-OM and LAD beyond the LIMA in July 2012 and non-ST elevation myocardial infarction in September 2013 secondary to in-stent restenosis of the SVG-OM treated with balloon angioplasty.   Noted bradycardic as well as PVCs when seen by Sean Lozano March 2020.  Order Holter monitor but never completed.  Echocardiogram 09/2018 showed LV function of 60 to [id number] and mild diastolic dysfunction.  Here for follow up with his son..  Lives independent living facility with wife.  No activity except helping intermittently to his wife.  He reports bilateral shoulder pain when trying to help her wife get out of bed.  Denies chest pressure, shortness of breath, palpitation, dizziness, orthopnea, PND or syncope.  He denies recurrent bradycardia episode.  He was unable to wore monitor because he did not know how to put it on and son was not allowed to visit independent living facility when pandemic started,  So they returned.   Past Medical History:  Diagnosis Date  . 1st degree AV block   . Arthritis   . Asthma   . BARRETTS ESOPHAGUS 06/02/2007  . Benign prostatic hypertrophy   . Bladder neck obstruction 09/29/2010  . CAD (coronary artery disease)    a. CABG x 6 in 1992. b. s/p DES x 2 to SVG -> OM1/OM2 in 09/2010. c. s/p DES to mLAD 10/2010. d. 12/2011 NSTEMI (off DAPT) s/p PTCA of ISR of DES in SVG-OM, Recs for lifelong DAPT   . Carotid artery disease ([place])    Doppler, May, 2014, [id number] Lozano. ICA, 123456 LICA, with recent syncope patient sent for consult with vascular surgery  . Carotid bruit    April, 2014  . Cervical disc disease   . Concussion July 09, 2012   Scalp  . Depression   . Diabetes mellitus    type II-diet  . Diverticulosis   . Echocardiogram    Echo 09/2018: EF 60-65, Gr 1 DD, mild AoV sclerosis, trivial TR  . Ejection fraction    EF 60%, echo, June, 2012  . Fall at home July 09, 2012  . GERD (gastroesophageal reflux disease)   . Hearing loss 2014  . Hx of CABG    1992  . Hyperlipidemia    intolerance to Crestor Zocor Vytorin. He tolerates Pravachol...low HDL  . Paresthesias    successful surgery by Dr. Earnie Lozano  . Prostatitis   . Rosacea   . Sinus bradycardia    asymptomatic   . SKIN LESION 06/02/2007  . Subdural hematoma ([facility])    Subdural hematoma secondary to falling off a ladder  April, 2014,  medical therapy,  subdural did not expand while blood levels of ddual antiplatelet therapy decreased over one week.  Episode of chest pain in the hospital,, dual antiplatelet therapy restarted approximately one week after it was held  . Syncope    The patient lost consciousness and hit his head July 09 2012,  not clear yet if he had true syncope    Past Surgical History:  Procedure Laterality Date  . APPENDECTOMY    . Barretts Esophagus   20/20/09  . Bladder neck obstruction  09/29/10  . C-spine disc surgery  03/2006  . C4-5 surgery  01/2009  . CARDIAC CATHETERIZATION  July 2012   stent placed  X's  4  . COCCYX FRACTURE SURGERY  1997  . CORONARY ARTERY BYPASS GRAFT  1992  . ENDARTERECTOMY Left 09/07/2012   Procedure: ENDARTERECTOMY CAROTID with patch angioplasty;  Surgeon: Sean Misty, MD;  Location: [facility];  Service: Vascular;  Laterality: Left;  . LEFT HEART CATHETERIZATION WITH CORONARY/GRAFT ANGIOGRAM N/A 12/21/2011   Procedure: LEFT HEART CATHETERIZATION WITH Sean Lozano;  Surgeon: Sean Bow, MD;   Location: [facility] CATH LAB;  Service: Cardiovascular;  Laterality: N/A;  . TONSILLECTOMY      Current Medications: Prior to Admission medications   Medication Sig Start Date End Date Taking? Authorizing Provider  amLODipine (NORVASC) 5 MG tablet Take 1 tablet (5 mg total) by mouth daily. 06/26/18 06/26/19  Sean Lozano T, PA-C  aspirin (ASPIRIN EC) 81 MG EC tablet Take 81 mg by mouth at bedtime.     [provider]  Calcium Carb-Cholecalciferol (CALCIUM 600+D3 PO) Take 1 tablet by mouth 2 (two) times daily.    [provider]  clopidogrel (PLAVIX) 75 MG tablet TAKE 1 TABLET BY MOUTH EVERY DAY Patient taking differently: Take 75 mg by mouth at bedtime. TAKE 1 TABLET BY MOUTH EVERY DAY 05/09/18   Sean Mocha, MD  donepezil (ARICEPT) 10 MG tablet TAKE 1 TABLET BY MOUTH EVERYDAY AT BEDTIME 11/07/18   Sean Lozano, Sean Lozano, Lozano  nitroGLYCERIN (NITROSTAT) 0.4 MG SL tablet Place 1 tablet (0.4 mg total) under the tongue every 5 (five) minutes as needed for chest pain. 05/03/18   Sean Mocha, MD  OVER THE COUNTER MEDICATION Take 2 tablets by mouth 2 (two) times daily. Focus factor brain health memory dietary supplement    [provider]  pantoprazole (PROTONIX) 40 MG tablet Take 1 tablet (40 mg total) by mouth daily. 07/11/18   Sean Mocha, MD  polycarbophil (FIBERCON) 625 MG tablet Take 625 mg by mouth 2 (two) times daily.    [provider]  pravastatin (PRAVACHOL) 40 MG tablet TAKE 1 TABLET BY MOUTH EVERY EVENING Patient taking differently: Take 40 mg by mouth daily.  06/13/18   Sean Mocha, MD  tamsulosin (FLOMAX) 0.4 MG CAPS capsule TAKE 1 CAPSULE (0.4 MG TOTAL) BY MOUTH DAILY AFTER SUPPER. 12/19/18   Sean Borg, MD    Allergies:   Atenolol, Rosuvastatin, Simvastatin, Ultram [tramadol], Zofran [ondansetron hcl], Adhesive [tape], Codeine, and Ezetimibe-simvastatin   Social History   Socioeconomic History  . Marital status: Married    Spouse name: Sean Lozano  .  Number of children: 3  . Years of education: Not on file  . Highest education level: Bachelor's degree (e.g., BA, AB, BS)  Occupational History  . Occupation: retired-real estate  Tobacco Use  . Smoking status: Former Smoker    Types: Cigarettes    Start date: 01/03/1955  . Smokeless tobacco: Never Used  Substance and Sexual Activity  . Alcohol use: Yes    Comment: occ  . Drug use: No  . Sexual activity: Never  Other Topics Concern  . Not on file  Social History Narrative      Primary caregiver for wife who has dementia.  Patient is right-handed. He and his wife live in [facility]. He was recently discharged from P/T 2 x a week.    Social Determinants of Health   Financial Resource Strain: Low Risk   . Difficulty of Paying Living Expenses: Not hard at all  Food Insecurity: No Food Insecurity  . Worried About Charity fundraiser in the Last Year: Never true  . Ran Out of Food in the Last Year: Never true  Transportation Needs: No Transportation Needs  . Lack of Transportation (Medical): No  . Lack of Transportation (Non-Medical): No  Physical Activity: Inactive  . Days of Exercise per Week: 0 days  . Minutes of Exercise per Session: 0 min  Stress: Stress Concern Present  . Feeling of Stress : To some extent  Social Connections: Not Isolated  . Frequency of Communication with Friends and Family: More than three times a week  . Frequency of Social Gatherings with Friends and Family: More than three times a week  . Attends Religious Services: More than 4 times per year  . Active Member of Clubs or Organizations: Not on file  . Attends Archivist Meetings: More than 4 times per year  . Marital Status: Married     Family History:  The patient's family history includes Aneurysm in an other family member; Diabetes in his mother, sister, and son; Heart attack in his father; Heart disease in his father and mother; Hyperlipidemia in his son and  another family member; Hypertension in his son and son; Kidney disease in his sister; Liver cancer in an other family member.   ROS:   Please see the history of present illness.    ROS All other systems reviewed and are negative.   PHYSICAL EXAM:   VS:  BP 124/68   Pulse 67   Ht 5\' 11"  (1.803 m)   Wt 196 lb 3.2 oz (89 kg)   SpO2 96%   BMI 27.36 kg/m    GEN: elderly male  in no acute distress  HEENT: normal  Neck: no JVD, carotid bruits, or masses Cardiac: RRR; no murmurs, rubs, or gallops,no edema  Respiratory:  clear to auscultation bilaterally, normal work of breathing GI: soft, nontender, nondistended, + BS MS: no deformity or atrophy  Skin: warm and dry, no rash Neuro:  Alert and Oriented x 3, Strength and sensation are intact Psych: euthymic mood, full affect  Wt Readings from Last 3 Encounters:  03/22/19 196 lb 3.2 oz (89 kg)  12/25/18 193 lb (87.5 kg)  11/03/18 196 lb (88.9 kg)      Studies/Labs Reviewed:   EKG:  EKG is not ordered today.   Recent Labs: 06/14/2018: Magnesium 2.4 12/25/2018: ALT 16; BUN 17; Creatinine, Ser 1.22; Hemoglobin 15.3; Platelets 193.0; Potassium 4.0; Sodium 138; TSH 2.23   Lipid Panel    Component Value Date/Time   CHOL 144 12/25/2018 1650   TRIG 152.0 (H) 12/25/2018 1650   HDL 36.40 (L) 12/25/2018 1650   CHOLHDL 4 12/25/2018 1650   VLDL 30.4 12/25/2018 1650   LDLCALC 77 12/25/2018 1650   LDLDIRECT 87.6 09/19/2008 1546    Additional studies/ records that were reviewed today include:   Echocardiogram: 09/2018 1. The left ventricle has normal systolic function with an ejection fraction of 60-65%. The cavity size was normal. Left ventricular diastolic Doppler parameters are consistent with impaired relaxation. No evidence of left ventricular regional wall  motion abnormalities.  2. The right ventricle has normal systolic function.  The cavity was normal. There is no increase in right ventricular wall thickness.  3. The mitral valve  is grossly normal.  4. The tricuspid valve is grossly normal.  5. The aortic valve is tricuspid. Mild sclerosis of the aortic valve. No stenosis of the aortic valve.  ASSESSMENT & PLAN:    1. CAD - No angina. Continue current medical therapy.   2. HTN - BP stable on current medications.   3. HLD - continue statin.     Medication Adjustments/Labs and Tests Ordered: Current medicines are reviewed at length with the patient today.  Concerns regarding medicines are outlined above.  Medication changes, Labs and Tests ordered today are listed in the Patient Instructions below. Patient Instructions  Medication Instructions:  Your physician recommends that you continue on your current medications as directed. Please refer to the Current Medication list given to you today.  *If you need a refill on your cardiac medications before your next appointment, please call your pharmacy*  Lab Work: None  If you have labs (blood work) drawn today and your tests are completely normal, you will receive your results only by: Marland Kitchen MyChart Message (if you have MyChart) OR . A paper copy in the mail If you have any lab test that is abnormal or we need to change your treatment, we will call you to review the results.  Testing/Procedures: None   Follow-Up: At [facility], you and your health needs are our priority.  As part of our continuing mission to provide you with exceptional heart care, we have created designated Provider Care Teams.  These Care Teams include your primary Cardiologist (physician) and Advanced Practice Providers (APPs -  Physician Assistants and Nurse Practitioners) who all work together to provide you with the care you need, when you need it.  Your next appointment:   12 month(s)  The format for your next appointment:   In Person  Provider:   You may see Sean Mocha, MD or one of the following Advanced Practice Providers on your designated Care Team:    Sean Dopp,  PA-C  [place], [place]  Daune Perch, NP   Other Instructions      Signed, Leanor Kail, [place]  03/22/2019 1:51 PM    [address], [place], [place]  [address] Phone: [phone]; Fax: [phone]

## 2019-03-22 ENCOUNTER — Encounter: Payer: Self-pay | Admitting: Physician Assistant

## 2019-03-22 ENCOUNTER — Ambulatory Visit: Payer: Medicare Other | Admitting: Physician Assistant

## 2019-03-22 ENCOUNTER — Other Ambulatory Visit: Payer: Self-pay

## 2019-03-22 VITALS — BP 124/68 | HR 67 | Ht 71.0 in | Wt 196.2 lb

## 2019-03-22 DIAGNOSIS — E782 Mixed hyperlipidemia: Secondary | ICD-10-CM | POA: Diagnosis not present

## 2019-03-22 DIAGNOSIS — I10 Essential (primary) hypertension: Secondary | ICD-10-CM

## 2019-03-22 DIAGNOSIS — I2510 Atherosclerotic heart disease of native coronary artery without angina pectoris: Secondary | ICD-10-CM | POA: Diagnosis not present

## 2019-03-22 NOTE — Patient Instructions (Signed)
Medication Instructions:  Your physician recommends that you continue on your current medications as directed. Please refer to the Current Medication list given to you today.  *If you need a refill on your cardiac medications before your next appointment, please call your pharmacy*  Lab Work: None  If you have labs (blood work) drawn today and your tests are completely normal, you will receive your results only by: Marland Kitchen MyChart Message (if you have MyChart) OR . A paper copy in the mail If you have any lab test that is abnormal or we need to change your treatment, we will call you to review the results.  Testing/Procedures: None   Follow-Up: At [facility], you and your health needs are our priority.  As part of our continuing mission to provide you with exceptional heart care, we have created designated Provider Care Teams.  These Care Teams include your primary Cardiologist (physician) and Advanced Practice Providers (APPs -  Physician Assistants and Nurse Practitioners) who all work together to provide you with the care you need, when you need it.  Your next appointment:   12 month(s)  The format for your next appointment:   In Person  Provider:   You may see Sherren Mocha, MD or one of the following Advanced Practice Providers on your designated Care Team:    [place] Dopp, PA-C  [place], [place]  Daune Perch, NP   Other Instructions

## 2019-04-16 ENCOUNTER — Ambulatory Visit: Payer: Medicare Other | Admitting: Family

## 2019-05-03 DIAGNOSIS — Z961 Presence of intraocular lens: Secondary | ICD-10-CM | POA: Diagnosis not present

## 2019-05-03 DIAGNOSIS — E119 Type 2 diabetes mellitus without complications: Secondary | ICD-10-CM | POA: Diagnosis not present

## 2019-05-03 DIAGNOSIS — H401132 Primary open-angle glaucoma, bilateral, moderate stage: Secondary | ICD-10-CM | POA: Diagnosis not present

## 2019-05-10 ENCOUNTER — Encounter: Payer: Self-pay | Admitting: Neurology

## 2019-05-11 ENCOUNTER — Other Ambulatory Visit: Payer: Self-pay

## 2019-05-11 ENCOUNTER — Telehealth (INDEPENDENT_AMBULATORY_CARE_PROVIDER_SITE_OTHER): Payer: Medicare PPO | Admitting: Neurology

## 2019-05-11 VITALS — Ht 71.0 in | Wt 190.0 lb

## 2019-05-11 DIAGNOSIS — I6523 Occlusion and stenosis of bilateral carotid arteries: Secondary | ICD-10-CM

## 2019-05-11 DIAGNOSIS — F0150 Vascular dementia without behavioral disturbance: Secondary | ICD-10-CM

## 2019-05-11 DIAGNOSIS — I10 Essential (primary) hypertension: Secondary | ICD-10-CM

## 2019-05-11 DIAGNOSIS — E785 Hyperlipidemia, unspecified: Secondary | ICD-10-CM

## 2019-05-11 DIAGNOSIS — I25810 Atherosclerosis of coronary artery bypass graft(s) without angina pectoris: Secondary | ICD-10-CM | POA: Diagnosis not present

## 2019-05-11 NOTE — Progress Notes (Signed)
Due to the COVID-19 crisis, this virtual check-in visit was done via telephone from my office and it was initiated and consent given by this patient and or family.   Telephone (Audio) Visit The purpose of this telephone visit is to provide medical care while limiting exposure to the novel coronavirus.    Consent was obtained for telephone visit and initiated by pt/family:  Yes.   Answered questions that patient had about telehealth interaction:  Yes.   I discussed the limitations, risks, security and privacy concerns of performing an evaluation and management service by telephone. I also discussed with the patient that there may be a patient responsible charge related to this service. The patient expressed understanding and agreed to proceed.  Pt location: Home Physician Location: office Name of referring provider:  Biagio Borg, MD I connected with .Candida Peeling at patients initiation/request on 05/11/2019 at  3:30 PM EST by telephone and verified that I am speaking with the correct person using two identifiers.  Pt MRN:  [id number] Pt DOB:  [birth year]   History of Present Illness:  Amaurion Feith an 86 year old right-handed male with CAD status post CABG/and STEMI, diabetes mellitus, depression, carotid artery disease and hyperlipidemia who follows up for stroke and mild cognitive impairment. He is accompanied by his son who supplements history.  UPDATE: Current medications: Aricept 10mg , ASA, Plavix, pravastatin  He lives at [facility] with his wife. His son sees him once a week. His son manages his bills and medications. He is independent in regards to dressing, bathing and using toilet. Due to the pandemic, he is not driving. He typically lets his son drive.   12/25/2018 TG 152; LDL 77; HGB A1c 6.2; TSH 2.23  HISTORY: In 2014, he began experiencing unexplained falls, one of them causing a concussion. Carotid doppler from May 2014 demonstrated severe left ICA  stenosis of 80-99%. He underwent left carotid endarterectomy that month.   SinceDecember 2018, he has had several more falls.Since he stopped Benadryl, tramadol and muscle relaxers, he has not had further falls. In June, he had an episode of slurred speech, word-finding difficulty, difficulty operating the TV remote, using the phone.Confusion has cleared but he continues to have word-finding anddifficulty using the remote. He has had trouble with managing medications. He drives minimally. He denies disorientation on familiar routes. In hindsight, his son thinks the confusion has been more gradual. He and his wife has since moved in to a group retirement home. Since June2019, his son handles his bills out of precaution but he has been able to handle finances himself. Most bills are on autopay. He cares for his wife who has dementia. He was already on ASA, Plavix andstatin therapy. He saw his PCP two weeks later. EKG looked okay. MRI of brain from 10/27/17 was personally reviewed and demonstrated moderate to severe chronic small vessel ischemic changes but no acute or subacute infarct, bleed, or mass lesion. Lipid panel from 10/20/17 showed total cholesterol 130, TG 129, HDL 36.80, and LDL 67. Hgb A1c was 6.5.   11/25/17: B12 720, TSH 2.46 12/05/17 Echo with bubble study: EF 65-70%. No ASD, PFO or other cardiac source of emboli 04/21/18 Carotid doppler: Bilateral 1-39% ICA stenosis s/p left CEA. Vertebrals with antegrade flow.  His mother had Alzheimer's disease. She passed at 54. His father died at 61 from aortic dissection.   Observations/Objective:  Vitals:   05/10/19 1216  Weight: 190 lb (86.2 kg)  Height: 5\' 11"  (1.803 m)  Assessment and Plan:   1. Vascular dementia 2.  Carotid artery disease s/p left CEA 3.  Hypertension 4.  Hyperlipidemia 5.  Coronary artery disease  1.  Aricept 10mg  at bedtime 2.  Secondary stroke prevention:  ASA 81mg  and Plavix 75mg      Pravastatin 40mg  (LDL goal less than 70)  Blood pressure control 3.  Follow up in 9 months.  Follow Up Instructions:    -I discussed the assessment and treatment plan with the patient. The patient was provided an opportunity to ask questions and all were answered. The patient agreed with the plan and demonstrated an understanding of the instructions.   The patient was advised to call back or seek an in-person evaluation if the symptoms worsen or if the condition fails to improve as anticipated.    Total Time spent in visit with the patient was:  12 minutes  Dudley Major, DO

## 2019-05-12 ENCOUNTER — Other Ambulatory Visit: Payer: Self-pay | Admitting: Neurology

## 2019-05-12 DIAGNOSIS — M1712 Unilateral primary osteoarthritis, left knee: Secondary | ICD-10-CM | POA: Diagnosis not present

## 2019-05-22 DIAGNOSIS — M25562 Pain in left knee: Secondary | ICD-10-CM | POA: Diagnosis not present

## 2019-05-22 DIAGNOSIS — M1712 Unilateral primary osteoarthritis, left knee: Secondary | ICD-10-CM | POA: Diagnosis not present

## 2019-05-22 DIAGNOSIS — M6281 Muscle weakness (generalized): Secondary | ICD-10-CM | POA: Diagnosis not present

## 2019-05-22 DIAGNOSIS — R2681 Unsteadiness on feet: Secondary | ICD-10-CM | POA: Diagnosis not present

## 2019-05-27 ENCOUNTER — Other Ambulatory Visit: Payer: Self-pay | Admitting: Cardiovascular Disease

## 2019-05-28 DIAGNOSIS — M25562 Pain in left knee: Secondary | ICD-10-CM | POA: Diagnosis not present

## 2019-05-28 DIAGNOSIS — R2681 Unsteadiness on feet: Secondary | ICD-10-CM | POA: Diagnosis not present

## 2019-05-28 DIAGNOSIS — M6281 Muscle weakness (generalized): Secondary | ICD-10-CM | POA: Diagnosis not present

## 2019-05-28 DIAGNOSIS — M1712 Unilateral primary osteoarthritis, left knee: Secondary | ICD-10-CM | POA: Diagnosis not present

## 2019-05-29 DIAGNOSIS — M6281 Muscle weakness (generalized): Secondary | ICD-10-CM | POA: Diagnosis not present

## 2019-05-29 DIAGNOSIS — M25562 Pain in left knee: Secondary | ICD-10-CM | POA: Diagnosis not present

## 2019-05-29 DIAGNOSIS — M1712 Unilateral primary osteoarthritis, left knee: Secondary | ICD-10-CM | POA: Diagnosis not present

## 2019-05-29 DIAGNOSIS — R2681 Unsteadiness on feet: Secondary | ICD-10-CM | POA: Diagnosis not present

## 2019-05-31 DIAGNOSIS — M1712 Unilateral primary osteoarthritis, left knee: Secondary | ICD-10-CM | POA: Diagnosis not present

## 2019-05-31 DIAGNOSIS — M6281 Muscle weakness (generalized): Secondary | ICD-10-CM | POA: Diagnosis not present

## 2019-05-31 DIAGNOSIS — M25562 Pain in left knee: Secondary | ICD-10-CM | POA: Diagnosis not present

## 2019-05-31 DIAGNOSIS — R2681 Unsteadiness on feet: Secondary | ICD-10-CM | POA: Diagnosis not present

## 2019-06-04 DIAGNOSIS — M6281 Muscle weakness (generalized): Secondary | ICD-10-CM | POA: Diagnosis not present

## 2019-06-04 DIAGNOSIS — M1712 Unilateral primary osteoarthritis, left knee: Secondary | ICD-10-CM | POA: Diagnosis not present

## 2019-06-04 DIAGNOSIS — M25562 Pain in left knee: Secondary | ICD-10-CM | POA: Diagnosis not present

## 2019-06-04 DIAGNOSIS — R2681 Unsteadiness on feet: Secondary | ICD-10-CM | POA: Diagnosis not present

## 2019-06-05 DIAGNOSIS — M25562 Pain in left knee: Secondary | ICD-10-CM | POA: Diagnosis not present

## 2019-06-05 DIAGNOSIS — R2681 Unsteadiness on feet: Secondary | ICD-10-CM | POA: Diagnosis not present

## 2019-06-05 DIAGNOSIS — M1712 Unilateral primary osteoarthritis, left knee: Secondary | ICD-10-CM | POA: Diagnosis not present

## 2019-06-05 DIAGNOSIS — M6281 Muscle weakness (generalized): Secondary | ICD-10-CM | POA: Diagnosis not present

## 2019-06-07 DIAGNOSIS — R2681 Unsteadiness on feet: Secondary | ICD-10-CM | POA: Diagnosis not present

## 2019-06-07 DIAGNOSIS — M6281 Muscle weakness (generalized): Secondary | ICD-10-CM | POA: Diagnosis not present

## 2019-06-07 DIAGNOSIS — M1712 Unilateral primary osteoarthritis, left knee: Secondary | ICD-10-CM | POA: Diagnosis not present

## 2019-06-07 DIAGNOSIS — M25562 Pain in left knee: Secondary | ICD-10-CM | POA: Diagnosis not present

## 2019-06-11 DIAGNOSIS — R2681 Unsteadiness on feet: Secondary | ICD-10-CM | POA: Diagnosis not present

## 2019-06-11 DIAGNOSIS — M25562 Pain in left knee: Secondary | ICD-10-CM | POA: Diagnosis not present

## 2019-06-11 DIAGNOSIS — M1712 Unilateral primary osteoarthritis, left knee: Secondary | ICD-10-CM | POA: Diagnosis not present

## 2019-06-11 DIAGNOSIS — M6281 Muscle weakness (generalized): Secondary | ICD-10-CM | POA: Diagnosis not present

## 2019-06-12 ENCOUNTER — Ambulatory Visit: Payer: Medicare Other | Admitting: Vascular Surgery

## 2019-06-12 ENCOUNTER — Encounter (HOSPITAL_COMMUNITY): Payer: Medicare Other

## 2019-06-14 DIAGNOSIS — M1712 Unilateral primary osteoarthritis, left knee: Secondary | ICD-10-CM | POA: Diagnosis not present

## 2019-06-14 DIAGNOSIS — M25562 Pain in left knee: Secondary | ICD-10-CM | POA: Diagnosis not present

## 2019-06-14 DIAGNOSIS — M6281 Muscle weakness (generalized): Secondary | ICD-10-CM | POA: Diagnosis not present

## 2019-06-14 DIAGNOSIS — R2681 Unsteadiness on feet: Secondary | ICD-10-CM | POA: Diagnosis not present

## 2019-06-15 DIAGNOSIS — M1712 Unilateral primary osteoarthritis, left knee: Secondary | ICD-10-CM | POA: Diagnosis not present

## 2019-06-15 DIAGNOSIS — R2681 Unsteadiness on feet: Secondary | ICD-10-CM | POA: Diagnosis not present

## 2019-06-15 DIAGNOSIS — M6281 Muscle weakness (generalized): Secondary | ICD-10-CM | POA: Diagnosis not present

## 2019-06-15 DIAGNOSIS — M25562 Pain in left knee: Secondary | ICD-10-CM | POA: Diagnosis not present

## 2019-06-20 DIAGNOSIS — M6281 Muscle weakness (generalized): Secondary | ICD-10-CM | POA: Diagnosis not present

## 2019-06-20 DIAGNOSIS — M25562 Pain in left knee: Secondary | ICD-10-CM | POA: Diagnosis not present

## 2019-06-20 DIAGNOSIS — R2681 Unsteadiness on feet: Secondary | ICD-10-CM | POA: Diagnosis not present

## 2019-06-20 DIAGNOSIS — M1712 Unilateral primary osteoarthritis, left knee: Secondary | ICD-10-CM | POA: Diagnosis not present

## 2019-06-21 DIAGNOSIS — M25562 Pain in left knee: Secondary | ICD-10-CM | POA: Diagnosis not present

## 2019-06-21 DIAGNOSIS — M1712 Unilateral primary osteoarthritis, left knee: Secondary | ICD-10-CM | POA: Diagnosis not present

## 2019-06-21 DIAGNOSIS — M6281 Muscle weakness (generalized): Secondary | ICD-10-CM | POA: Diagnosis not present

## 2019-06-21 DIAGNOSIS — R2681 Unsteadiness on feet: Secondary | ICD-10-CM | POA: Diagnosis not present

## 2019-06-22 DIAGNOSIS — M25562 Pain in left knee: Secondary | ICD-10-CM | POA: Diagnosis not present

## 2019-06-22 DIAGNOSIS — M1712 Unilateral primary osteoarthritis, left knee: Secondary | ICD-10-CM | POA: Diagnosis not present

## 2019-06-22 DIAGNOSIS — R2681 Unsteadiness on feet: Secondary | ICD-10-CM | POA: Diagnosis not present

## 2019-06-22 DIAGNOSIS — M6281 Muscle weakness (generalized): Secondary | ICD-10-CM | POA: Diagnosis not present

## 2019-06-24 ENCOUNTER — Other Ambulatory Visit: Payer: Self-pay | Admitting: Internal Medicine

## 2019-06-24 NOTE — Telephone Encounter (Signed)
Please refill as per office routine med refill policy (all routine meds refilled for 3 mo or monthly per pt preference up to one year from last visit, then month to month grace period for 3 mo, then further med refills will have to be denied)  

## 2019-06-25 ENCOUNTER — Encounter: Payer: Self-pay | Admitting: Internal Medicine

## 2019-06-25 ENCOUNTER — Other Ambulatory Visit: Payer: Self-pay

## 2019-06-25 ENCOUNTER — Ambulatory Visit: Payer: Medicare PPO | Admitting: Internal Medicine

## 2019-06-25 VITALS — BP 130/70 | HR 60 | Temp 97.7°F | Ht 71.0 in | Wt 186.0 lb

## 2019-06-25 DIAGNOSIS — L989 Disorder of the skin and subcutaneous tissue, unspecified: Secondary | ICD-10-CM

## 2019-06-25 DIAGNOSIS — E119 Type 2 diabetes mellitus without complications: Secondary | ICD-10-CM

## 2019-06-25 DIAGNOSIS — Z0000 Encounter for general adult medical examination without abnormal findings: Secondary | ICD-10-CM | POA: Diagnosis not present

## 2019-06-25 LAB — POCT GLYCOSYLATED HEMOGLOBIN (HGB A1C): Hemoglobin A1C: 6.7 % — AB (ref 4.0–5.6)

## 2019-06-25 NOTE — Progress Notes (Signed)
Subjective:    Patient ID: Sean Lozano, male    DOB: 1933-10-11, 86 y.o.   MRN: [id number]  HPI  Here for wellness and f/u;  Overall doing ok;  Pt denies Chest pain, worsening SOB, DOE, wheezing, orthopnea, PND, worsening LE edema, palpitations, dizziness or syncope.  Pt denies neurological change such as new headache, facial or extremity weakness.  Pt denies polydipsia, polyuria, or low sugar symptoms. Pt states overall good compliance with treatment and medications, good tolerability, and has been trying to follow appropriate diet.  Pt denies worsening depressive symptoms, suicidal ideation or panic. No fever, night sweats, wt loss, loss of appetite, or other constitutional symptoms.  Pt states good ability with ADL's, has low fall risk, home safety reviewed and adequate, no other significant changes in hearing or vision, and only occasionally active with exercise. Does have a right facial lesion larger non healing in the past 2 mo.  Did have an incidental fall yesterday to both knees with abrasions but o/w already getting cane and PT.  Now living at [facility] Past Medical History:  Diagnosis Date  . 1st degree AV block   . Arthritis   . Asthma   . BARRETTS ESOPHAGUS 06/02/2007  . Benign prostatic hypertrophy   . Bladder neck obstruction 09/29/2010  . CAD (coronary artery disease)    a. CABG x 6 in 1992. b. s/p DES x 2 to SVG -> OM1/OM2 in 09/2010. c. s/p DES to mLAD 10/2010. d. 12/2011 NSTEMI (off DAPT) s/p PTCA of ISR of DES in SVG-OM, Recs for lifelong DAPT   . Carotid artery disease ([place])    Doppler, May, 2014, [id number] R. ICA, [id number] LICA, with recent syncope patient sent for consult with vascular surgery  . Carotid bruit    April, 2014  . Cervical disc disease   . Concussion July 09, 2012   Scalp  . Depression   . Diabetes mellitus    type II-diet  . Diverticulosis   . Echocardiogram    Echo 09/2018: EF 60-65, Gr 1 DD, mild AoV sclerosis, trivial TR  . Ejection fraction    EF 60%, echo, June, 2012  . Fall at home July 09, 2012  . GERD (gastroesophageal reflux disease)   . Hearing loss 2014  . Hx of CABG    1992  . Hyperlipidemia    intolerance to Crestor Zocor Vytorin. He tolerates Pravachol...low HDL  . Paresthesias    successful surgery by Dr. Earnie Larsson  . Prostatitis   . Rosacea   . Sinus bradycardia    asymptomatic   . SKIN LESION 06/02/2007  . Subdural hematoma (HCC)    Subdural hematoma secondary to falling off a ladder  April, 2014,  medical therapy,  subdural did not expand while blood levels of ddual antiplatelet therapy decreased over one week.  Episode of chest pain in the hospital,, dual antiplatelet therapy restarted approximately one week after it was held  . Syncope    The patient lost consciousness and hit his head July 09 2012, not clear yet if he had true syncope   Past Surgical History:  Procedure Laterality Date  . APPENDECTOMY    . Barretts Esophagus   20/20/09  . Bladder neck obstruction  09/29/10  . C-spine disc surgery  03/2006  . C4-5 surgery  01/2009  . CARDIAC CATHETERIZATION  July 2012   stent placed  X's  4  . COCCYX FRACTURE SURGERY  1997  . CORONARY ARTERY BYPASS GRAFT  1992  . ENDARTERECTOMY Left 09/07/2012   Procedure: ENDARTERECTOMY CAROTID with patch angioplasty;  Surgeon: Mal Misty, MD;  Location: [facility];  Service: Vascular;  Laterality: Left;  . LEFT HEART CATHETERIZATION WITH CORONARY/GRAFT ANGIOGRAM N/A 12/21/2011   Procedure: LEFT HEART CATHETERIZATION WITH Beatrix Fetters;  Surgeon: Hillary Bow, MD;  Location: [facility];  Service: Cardiovascular;  Laterality: N/A;  . TONSILLECTOMY      reports that he has quit smoking. His smoking use included cigarettes. He started smoking about 64 years ago. He has never used smokeless tobacco. He reports current alcohol use. He reports that he does not use drugs. family history includes Aneurysm in an other family member; Diabetes in his mother,  sister, and son; Heart attack in his father; Heart disease in his father and mother; Hyperlipidemia in his son and another family member; Hypertension in his son and son; Kidney disease in his sister; Liver cancer in an other family member. Allergies  Allergen Reactions  . Atenolol Other (See Comments)    depression  . Rosuvastatin Other (See Comments)    Severe hip pain  . Simvastatin Other (See Comments)    Severe hip pain  . Ultram [Tramadol] Nausea And Vomiting  . Zofran [Ondansetron Hcl] Nausea Only and Other (See Comments)    Severe nausea and constipation  . Adhesive [Tape] Other (See Comments)    Breakdown skin  . Codeine Other (See Comments)    Crazy feeling  . Ezetimibe-Simvastatin Other (See Comments)    Severe hip pain   Current Outpatient Medications on File Prior to Visit  Medication Sig Dispense Refill  . amLODipine (NORVASC) 5 MG tablet Take 1 tablet (5 mg total) by mouth daily. 90 tablet 3  . aspirin (ASPIRIN EC) 81 MG EC tablet Take 81 mg by mouth at bedtime.     . clopidogrel (PLAVIX) 75 MG tablet TAKE 1 TABLET BY MOUTH EVERY DAY 90 tablet 3  . donepezil (ARICEPT) 10 MG tablet TAKE 1 TABLET BY MOUTH EVERYDAY AT BEDTIME 90 tablet 1  . nitroGLYCERIN (NITROSTAT) 0.4 MG SL tablet Place 1 tablet (0.4 mg total) under the tongue every 5 (five) minutes as needed for chest pain. 75 tablet 2  . pantoprazole (PROTONIX) 40 MG tablet Take 1 tablet (40 mg total) by mouth daily. 90 tablet 3  . polycarbophil (FIBERCON) 625 MG tablet Take 625 mg by mouth 2 (two) times daily.    . pravastatin (PRAVACHOL) 40 MG tablet TAKE 1 TABLET BY MOUTH EVERY EVENING (Patient taking differently: Take 40 mg by mouth daily. ) 90 tablet 3  . tamsulosin (FLOMAX) 0.4 MG CAPS capsule TAKE 1 CAPSULE (0.4 MG TOTAL) BY MOUTH DAILY AFTER SUPPER. 90 capsule 1  . Calcium Carb-Cholecalciferol (CALCIUM 600+D3 PO) Take 1 tablet by mouth 2 (two) times daily.    Marland Kitchen OVER THE COUNTER MEDICATION Take 2 tablets by mouth  2 (two) times daily. Focus factor brain health memory dietary supplement     No current facility-administered medications on file prior to visit.   Review of Systems All otherwise neg per pt     Objective:   Physical Exam BP 130/70   Pulse 60   Temp 97.7 F (36.5 C)   Ht 5\' 11"  (1.803 m)   Wt 186 lb (84.4 kg)   SpO2 98%   BMI 25.94 kg/m  VS noted,  Constitutional: Pt appears in NAD HENT: Head: NCAT.  Right Ear: External ear normal.  Left Ear: External ear normal.  Eyes: . Pupils are equal, round, and reactive to light. Conjunctivae and EOM are normal Nose: without d/c or deformity Neck: Neck supple. Gross normal ROM Cardiovascular: Normal rate and regular rhythm.   Pulmonary/Chest: Effort normal and breath sounds without rales or wheezing.  Abd:  Soft, NT, ND, + BS, no organomegaly Neurological: Pt is alert. At baseline orientation, motor grossly intact Skin: Skin is warm. No rashes, no LE edema Psychiatric: Pt behavior is normal without agitation   POCT HgB A1C Order: [id number] Status:  Final result Visible to patient:  Yes (MyChart) Dx:  Type 2 diabetes mellitus without comp...  Ref Range & Units 17:19  (06/25/19) 6 mo ago  (12/25/18) 1 yr ago  (10/20/17) 2 yr ago  (06/06/17) 2 yr ago  (11/24/16) 3 yr ago  (05/24/16) 3 yr ago  (11/26/15)  Hemoglobin A1C 4.0 - 5.6 % 6.7Abnormal   6.2 R, CM  6.5 R, CM  6.5 R, CM  6.5 R, CM  6.9High  R, CM  6.9High  R,           Assessment & Plan:

## 2019-06-25 NOTE — Assessment & Plan Note (Signed)
For derm referral

## 2019-06-25 NOTE — Assessment & Plan Note (Signed)
stable overall by history and exam, recent data reviewed with pt, and pt to continue medical treatment as before,  to f/u any worsening symptoms or concerns  

## 2019-06-25 NOTE — Assessment & Plan Note (Signed)

## 2019-06-25 NOTE — Patient Instructions (Signed)
Your A1c was Ok today  Please continue all other medications as before, and refills have been done if requested.  Please have the pharmacy call with any other refills you may need.  Please continue your efforts at being more active, low cholesterol diet, and weight control.  You are otherwise up to date with prevention measures today.  Please keep your appointments with your specialists as you may have planned  You will be contacted regarding the referral for: Dermatology  Please make an Appointment to return in 6 months, or sooner if needed

## 2019-06-26 DIAGNOSIS — M6281 Muscle weakness (generalized): Secondary | ICD-10-CM | POA: Diagnosis not present

## 2019-06-26 DIAGNOSIS — R2681 Unsteadiness on feet: Secondary | ICD-10-CM | POA: Diagnosis not present

## 2019-06-26 DIAGNOSIS — M25562 Pain in left knee: Secondary | ICD-10-CM | POA: Diagnosis not present

## 2019-06-26 DIAGNOSIS — M1712 Unilateral primary osteoarthritis, left knee: Secondary | ICD-10-CM | POA: Diagnosis not present

## 2019-06-27 DIAGNOSIS — M1712 Unilateral primary osteoarthritis, left knee: Secondary | ICD-10-CM | POA: Diagnosis not present

## 2019-06-27 DIAGNOSIS — M25562 Pain in left knee: Secondary | ICD-10-CM | POA: Diagnosis not present

## 2019-06-27 DIAGNOSIS — M6281 Muscle weakness (generalized): Secondary | ICD-10-CM | POA: Diagnosis not present

## 2019-06-27 DIAGNOSIS — R2681 Unsteadiness on feet: Secondary | ICD-10-CM | POA: Diagnosis not present

## 2019-06-28 DIAGNOSIS — M6281 Muscle weakness (generalized): Secondary | ICD-10-CM | POA: Diagnosis not present

## 2019-06-28 DIAGNOSIS — M25562 Pain in left knee: Secondary | ICD-10-CM | POA: Diagnosis not present

## 2019-06-28 DIAGNOSIS — M1712 Unilateral primary osteoarthritis, left knee: Secondary | ICD-10-CM | POA: Diagnosis not present

## 2019-06-28 DIAGNOSIS — R2681 Unsteadiness on feet: Secondary | ICD-10-CM | POA: Diagnosis not present

## 2019-07-01 ENCOUNTER — Other Ambulatory Visit: Payer: Self-pay | Admitting: Cardiovascular Disease

## 2019-07-04 DIAGNOSIS — R2681 Unsteadiness on feet: Secondary | ICD-10-CM | POA: Diagnosis not present

## 2019-07-04 DIAGNOSIS — M6281 Muscle weakness (generalized): Secondary | ICD-10-CM | POA: Diagnosis not present

## 2019-07-04 DIAGNOSIS — M1712 Unilateral primary osteoarthritis, left knee: Secondary | ICD-10-CM | POA: Diagnosis not present

## 2019-07-04 DIAGNOSIS — M25562 Pain in left knee: Secondary | ICD-10-CM | POA: Diagnosis not present

## 2019-07-06 DIAGNOSIS — M1712 Unilateral primary osteoarthritis, left knee: Secondary | ICD-10-CM | POA: Diagnosis not present

## 2019-07-06 DIAGNOSIS — M6281 Muscle weakness (generalized): Secondary | ICD-10-CM | POA: Diagnosis not present

## 2019-07-06 DIAGNOSIS — M25562 Pain in left knee: Secondary | ICD-10-CM | POA: Diagnosis not present

## 2019-07-06 DIAGNOSIS — R2681 Unsteadiness on feet: Secondary | ICD-10-CM | POA: Diagnosis not present

## 2019-07-07 DIAGNOSIS — M6281 Muscle weakness (generalized): Secondary | ICD-10-CM | POA: Diagnosis not present

## 2019-07-07 DIAGNOSIS — R2681 Unsteadiness on feet: Secondary | ICD-10-CM | POA: Diagnosis not present

## 2019-07-07 DIAGNOSIS — M1712 Unilateral primary osteoarthritis, left knee: Secondary | ICD-10-CM | POA: Diagnosis not present

## 2019-07-07 DIAGNOSIS — M25562 Pain in left knee: Secondary | ICD-10-CM | POA: Diagnosis not present

## 2019-07-09 DIAGNOSIS — M6281 Muscle weakness (generalized): Secondary | ICD-10-CM | POA: Diagnosis not present

## 2019-07-09 DIAGNOSIS — M25562 Pain in left knee: Secondary | ICD-10-CM | POA: Diagnosis not present

## 2019-07-09 DIAGNOSIS — M1712 Unilateral primary osteoarthritis, left knee: Secondary | ICD-10-CM | POA: Diagnosis not present

## 2019-07-09 DIAGNOSIS — R2681 Unsteadiness on feet: Secondary | ICD-10-CM | POA: Diagnosis not present

## 2019-07-17 DIAGNOSIS — M6281 Muscle weakness (generalized): Secondary | ICD-10-CM | POA: Diagnosis not present

## 2019-07-17 DIAGNOSIS — M25562 Pain in left knee: Secondary | ICD-10-CM | POA: Diagnosis not present

## 2019-07-17 DIAGNOSIS — R2681 Unsteadiness on feet: Secondary | ICD-10-CM | POA: Diagnosis not present

## 2019-07-17 DIAGNOSIS — M1712 Unilateral primary osteoarthritis, left knee: Secondary | ICD-10-CM | POA: Diagnosis not present

## 2019-07-18 ENCOUNTER — Other Ambulatory Visit: Payer: Self-pay | Admitting: Cardiovascular Disease

## 2019-07-18 DIAGNOSIS — M1712 Unilateral primary osteoarthritis, left knee: Secondary | ICD-10-CM | POA: Diagnosis not present

## 2019-07-18 DIAGNOSIS — Z20828 Contact with and (suspected) exposure to other viral communicable diseases: Secondary | ICD-10-CM | POA: Diagnosis not present

## 2019-07-18 DIAGNOSIS — R2681 Unsteadiness on feet: Secondary | ICD-10-CM | POA: Diagnosis not present

## 2019-07-18 DIAGNOSIS — M25562 Pain in left knee: Secondary | ICD-10-CM | POA: Diagnosis not present

## 2019-07-18 DIAGNOSIS — M6281 Muscle weakness (generalized): Secondary | ICD-10-CM | POA: Diagnosis not present

## 2019-07-18 DIAGNOSIS — Z1159 Encounter for screening for other viral diseases: Secondary | ICD-10-CM | POA: Diagnosis not present

## 2019-07-19 DIAGNOSIS — M1712 Unilateral primary osteoarthritis, left knee: Secondary | ICD-10-CM | POA: Diagnosis not present

## 2019-07-19 DIAGNOSIS — M6281 Muscle weakness (generalized): Secondary | ICD-10-CM | POA: Diagnosis not present

## 2019-07-19 DIAGNOSIS — M25562 Pain in left knee: Secondary | ICD-10-CM | POA: Diagnosis not present

## 2019-07-19 DIAGNOSIS — R2681 Unsteadiness on feet: Secondary | ICD-10-CM | POA: Diagnosis not present

## 2019-07-22 ENCOUNTER — Other Ambulatory Visit: Payer: Self-pay | Admitting: Physician Assistant

## 2019-07-24 DIAGNOSIS — M25562 Pain in left knee: Secondary | ICD-10-CM | POA: Diagnosis not present

## 2019-07-24 DIAGNOSIS — R2681 Unsteadiness on feet: Secondary | ICD-10-CM | POA: Diagnosis not present

## 2019-07-24 DIAGNOSIS — M1712 Unilateral primary osteoarthritis, left knee: Secondary | ICD-10-CM | POA: Diagnosis not present

## 2019-07-24 DIAGNOSIS — M6281 Muscle weakness (generalized): Secondary | ICD-10-CM | POA: Diagnosis not present

## 2019-07-25 DIAGNOSIS — Z20828 Contact with and (suspected) exposure to other viral communicable diseases: Secondary | ICD-10-CM | POA: Diagnosis not present

## 2019-07-25 DIAGNOSIS — Z1159 Encounter for screening for other viral diseases: Secondary | ICD-10-CM | POA: Diagnosis not present

## 2019-07-26 DIAGNOSIS — R2681 Unsteadiness on feet: Secondary | ICD-10-CM | POA: Diagnosis not present

## 2019-07-26 DIAGNOSIS — M25562 Pain in left knee: Secondary | ICD-10-CM | POA: Diagnosis not present

## 2019-07-26 DIAGNOSIS — M1712 Unilateral primary osteoarthritis, left knee: Secondary | ICD-10-CM | POA: Diagnosis not present

## 2019-07-26 DIAGNOSIS — M6281 Muscle weakness (generalized): Secondary | ICD-10-CM | POA: Diagnosis not present

## 2019-07-28 DIAGNOSIS — M25562 Pain in left knee: Secondary | ICD-10-CM | POA: Diagnosis not present

## 2019-07-28 DIAGNOSIS — R2681 Unsteadiness on feet: Secondary | ICD-10-CM | POA: Diagnosis not present

## 2019-07-28 DIAGNOSIS — M6281 Muscle weakness (generalized): Secondary | ICD-10-CM | POA: Diagnosis not present

## 2019-07-28 DIAGNOSIS — M1712 Unilateral primary osteoarthritis, left knee: Secondary | ICD-10-CM | POA: Diagnosis not present

## 2019-07-30 DIAGNOSIS — M1712 Unilateral primary osteoarthritis, left knee: Secondary | ICD-10-CM | POA: Diagnosis not present

## 2019-07-30 DIAGNOSIS — M6281 Muscle weakness (generalized): Secondary | ICD-10-CM | POA: Diagnosis not present

## 2019-07-30 DIAGNOSIS — R2681 Unsteadiness on feet: Secondary | ICD-10-CM | POA: Diagnosis not present

## 2019-07-30 DIAGNOSIS — M25562 Pain in left knee: Secondary | ICD-10-CM | POA: Diagnosis not present

## 2019-07-31 DIAGNOSIS — M6281 Muscle weakness (generalized): Secondary | ICD-10-CM | POA: Diagnosis not present

## 2019-07-31 DIAGNOSIS — M1712 Unilateral primary osteoarthritis, left knee: Secondary | ICD-10-CM | POA: Diagnosis not present

## 2019-07-31 DIAGNOSIS — M25562 Pain in left knee: Secondary | ICD-10-CM | POA: Diagnosis not present

## 2019-07-31 DIAGNOSIS — R2681 Unsteadiness on feet: Secondary | ICD-10-CM | POA: Diagnosis not present

## 2019-08-01 ENCOUNTER — Other Ambulatory Visit: Payer: Self-pay | Admitting: *Deleted

## 2019-08-01 DIAGNOSIS — Z1159 Encounter for screening for other viral diseases: Secondary | ICD-10-CM | POA: Diagnosis not present

## 2019-08-01 DIAGNOSIS — Z9889 Other specified postprocedural states: Secondary | ICD-10-CM

## 2019-08-01 DIAGNOSIS — Z20828 Contact with and (suspected) exposure to other viral communicable diseases: Secondary | ICD-10-CM | POA: Diagnosis not present

## 2019-08-02 DIAGNOSIS — R2681 Unsteadiness on feet: Secondary | ICD-10-CM | POA: Diagnosis not present

## 2019-08-02 DIAGNOSIS — M1712 Unilateral primary osteoarthritis, left knee: Secondary | ICD-10-CM | POA: Diagnosis not present

## 2019-08-02 DIAGNOSIS — M25562 Pain in left knee: Secondary | ICD-10-CM | POA: Diagnosis not present

## 2019-08-02 DIAGNOSIS — M6281 Muscle weakness (generalized): Secondary | ICD-10-CM | POA: Diagnosis not present

## 2019-08-07 ENCOUNTER — Ambulatory Visit: Payer: Self-pay | Admitting: Vascular Surgery

## 2019-08-07 ENCOUNTER — Other Ambulatory Visit: Payer: Self-pay

## 2019-08-07 ENCOUNTER — Ambulatory Visit (HOSPITAL_COMMUNITY)
Admission: RE | Admit: 2019-08-07 | Discharge: 2019-08-07 | Disposition: A | Discharge status: Home / Self Care | Payer: Medicare PPO | Source: Ambulatory Visit | Attending: Vascular Surgery | Admitting: Vascular Surgery

## 2019-08-07 DIAGNOSIS — Z9889 Other specified postprocedural states: Secondary | ICD-10-CM | POA: Diagnosis not present

## 2019-08-08 DIAGNOSIS — Z20828 Contact with and (suspected) exposure to other viral communicable diseases: Secondary | ICD-10-CM | POA: Diagnosis not present

## 2019-08-08 DIAGNOSIS — Z1159 Encounter for screening for other viral diseases: Secondary | ICD-10-CM | POA: Diagnosis not present

## 2019-08-09 DIAGNOSIS — M1712 Unilateral primary osteoarthritis, left knee: Secondary | ICD-10-CM | POA: Diagnosis not present

## 2019-08-09 DIAGNOSIS — M6281 Muscle weakness (generalized): Secondary | ICD-10-CM | POA: Diagnosis not present

## 2019-08-09 DIAGNOSIS — R2681 Unsteadiness on feet: Secondary | ICD-10-CM | POA: Diagnosis not present

## 2019-08-09 DIAGNOSIS — M25562 Pain in left knee: Secondary | ICD-10-CM | POA: Diagnosis not present

## 2019-08-10 DIAGNOSIS — R2681 Unsteadiness on feet: Secondary | ICD-10-CM | POA: Diagnosis not present

## 2019-08-10 DIAGNOSIS — M1712 Unilateral primary osteoarthritis, left knee: Secondary | ICD-10-CM | POA: Diagnosis not present

## 2019-08-10 DIAGNOSIS — M6281 Muscle weakness (generalized): Secondary | ICD-10-CM | POA: Diagnosis not present

## 2019-08-10 DIAGNOSIS — M25562 Pain in left knee: Secondary | ICD-10-CM | POA: Diagnosis not present

## 2019-08-15 DIAGNOSIS — Z20828 Contact with and (suspected) exposure to other viral communicable diseases: Secondary | ICD-10-CM | POA: Diagnosis not present

## 2019-08-15 DIAGNOSIS — Z1159 Encounter for screening for other viral diseases: Secondary | ICD-10-CM | POA: Diagnosis not present

## 2019-08-22 ENCOUNTER — Other Ambulatory Visit: Payer: Self-pay

## 2019-08-22 ENCOUNTER — Encounter: Payer: Self-pay | Admitting: Dermatology

## 2019-08-22 ENCOUNTER — Ambulatory Visit: Payer: Medicare PPO | Admitting: Dermatology

## 2019-08-22 DIAGNOSIS — C44329 Squamous cell carcinoma of skin of other parts of face: Secondary | ICD-10-CM

## 2019-08-22 DIAGNOSIS — Z1159 Encounter for screening for other viral diseases: Secondary | ICD-10-CM | POA: Diagnosis not present

## 2019-08-22 DIAGNOSIS — C4492 Squamous cell carcinoma of skin, unspecified: Secondary | ICD-10-CM

## 2019-08-22 DIAGNOSIS — Z20828 Contact with and (suspected) exposure to other viral communicable diseases: Secondary | ICD-10-CM | POA: Diagnosis not present

## 2019-08-22 HISTORY — DX: Squamous cell carcinoma of skin, unspecified: C44.92

## 2019-08-27 ENCOUNTER — Encounter: Payer: Self-pay | Admitting: Dermatology

## 2019-08-27 NOTE — Progress Notes (Addendum)
   Follow-Up Visit   Subjective  Sean Lozano is a 86 y.o. male who presents for the following: Skin Problem (RIGHT CHEEK X MONTHS ).  SCCA Location: Cheek Duration: Several months Quality: Larger Associated Signs/Symptoms: Modifying Factors:  Severity:  Timing: Context: Treatment  The following portions of the chart were reviewed this encounter and updated as appropriate: Tobacco  Allergies  Meds  Problems  Med Hx  Surg Hx  Fam Hx      Objective  Well appearing patient in no apparent distress; mood and affect are within normal limits.  All skin waist up examined.   Assessment & Plan  SCCA (squamous cell carcinoma) of skin Right Buccal Cheek   Skin / nail biopsy  Skin excision  Lesion length (cm):  1.2 Lesion width (cm):  1 Margin per side (cm):  0.1 Total excision diameter (cm):  1.4 Informed consent: discussed and consent obtained   Timeout: patient name, date of birth, surgical site, and procedure verified   Procedure prep:  Patient was prepped and draped in usual sterile fashion Prep type:  Chlorhexidine Anesthesia: the lesion was anesthetized in a standard fashion   Anesthetic:  1% lidocaine w/ epinephrine 1-100,000 local infiltration Instrument used: #15 blade   Hemostasis achieved with: suture   Outcome: patient tolerated procedure well with no complications   Post-procedure details: sterile dressing applied and wound care instructions given   Dressing type: petrolatum   Additional details:  Layered closure 5-0 Vicryl +5 0 nylon  Specimen 1 - Surgical pathology Differential Diagnosis: R/O BCC SCC Check Margins: No  Curettage x3 showed deep extension, electrocautery, recurette, and narrow margin excision with layered closure performed.

## 2019-08-28 ENCOUNTER — Telehealth: Payer: Self-pay

## 2019-08-28 NOTE — Telephone Encounter (Signed)
Pathology results to patient's son, on HIPAA. 30 minute with ST on 09/27/2019 at 2pm.

## 2019-08-28 NOTE — Telephone Encounter (Signed)
-----   Message from Lavonna Monarch, MD sent at 08/28/2019  6:03 AM EDT ----- Schedule surgery with Dr. Darene Lamer

## 2019-08-29 DIAGNOSIS — Z1159 Encounter for screening for other viral diseases: Secondary | ICD-10-CM | POA: Diagnosis not present

## 2019-08-29 DIAGNOSIS — Z20828 Contact with and (suspected) exposure to other viral communicable diseases: Secondary | ICD-10-CM | POA: Diagnosis not present

## 2019-09-04 ENCOUNTER — Encounter: Payer: Self-pay | Admitting: Vascular Surgery

## 2019-09-04 ENCOUNTER — Ambulatory Visit: Payer: Medicare PPO | Admitting: Vascular Surgery

## 2019-09-04 ENCOUNTER — Other Ambulatory Visit: Payer: Self-pay

## 2019-09-04 VITALS — BP 110/61 | HR 69 | Temp 97.7°F | Resp 18 | Ht 69.0 in | Wt 187.0 lb

## 2019-09-04 DIAGNOSIS — I6523 Occlusion and stenosis of bilateral carotid arteries: Secondary | ICD-10-CM

## 2019-09-04 NOTE — Progress Notes (Signed)
Patient name: Sean Lozano MRN: [id number] DOB: 11-Feb-1934 Sex: male  REASON FOR VISIT: 1 year follow-up for surveillance of carotid artery disease  HPI: Sean Lozano is a 86 y.o. male with history of coronary disease status post CABG as well as additional PCI, diabetes, hyperlipidemia, carotid artery disease that presents for 1 year follow-up and surveillance.  Previously under the care of Dr. Kellie Simmering and had a left carotid endarterectomy 2014 for asymptomatic disease.  Over the last year he reports no significant changes to his health.  He denies any weakness or numbness in arm or leg, vision loss in 1 eye etc.  He was last seen by nurse practitioner January 2020 and had minimal disease in both carotid arteries.  Lives in facility with his wife with some assistance.  Son is here today.   Past Medical History:  Diagnosis Date  . 1st degree AV block   . Arthritis   . Asthma   . BARRETTS ESOPHAGUS 06/02/2007  . Benign prostatic hypertrophy   . Bladder neck obstruction 09/29/2010  . CAD (coronary artery disease)    a. CABG x 6 in 1992. b. s/p DES x 2 to SVG -> OM1/OM2 in 09/2010. c. s/p DES to mLAD 10/2010. d. 12/2011 NSTEMI (off DAPT) s/p PTCA of ISR of DES in SVG-OM, Recs for lifelong DAPT   . Carotid artery disease ([place])    Doppler, May, 2014, [id number] R. ICA, [id number] LICA, with recent syncope patient sent for consult with vascular surgery  . Carotid bruit    April, 2014  . Cervical disc disease   . Concussion July 09, 2012   Scalp  . Depression   . Diabetes mellitus    type II-diet  . Diverticulosis   . Echocardiogram    Echo 09/2018: EF 60-65, Gr 1 DD, mild AoV sclerosis, trivial TR  . Ejection fraction    EF 60%, echo, June, 2012  . Fall at home July 09, 2012  . GERD (gastroesophageal reflux disease)   . Hearing loss 2014  . Hx of CABG    1992  . Hyperlipidemia    intolerance to Crestor Zocor Vytorin. He tolerates Pravachol...low HDL  . Paresthesias    successful surgery by  Dr. Earnie Larsson  . Prostatitis   . Rosacea   . Sinus bradycardia    asymptomatic   . SKIN LESION 06/02/2007  . Squamous cell carcinoma of skin 08/22/2019   moderately differentiated on right buccal cheek  . Subdural hematoma (HCC)    Subdural hematoma secondary to falling off a ladder  April, 2014,  medical therapy,  subdural did not expand while blood levels of ddual antiplatelet therapy decreased over one week.  Episode of chest pain in the hospital,, dual antiplatelet therapy restarted approximately one week after it was held  . Syncope    The patient lost consciousness and hit his head July 09 2012, not clear yet if he had true syncope    Past Surgical History:  Procedure Laterality Date  . APPENDECTOMY    . Barretts Esophagus   20/20/09  . Bladder neck obstruction  09/29/10  . C-spine disc surgery  03/2006  . C4-5 surgery  01/2009  . CARDIAC CATHETERIZATION  July 2012   stent placed  X's  4  . COCCYX FRACTURE SURGERY  1997  . CORONARY ARTERY BYPASS GRAFT  1992  . ENDARTERECTOMY Left 09/07/2012   Procedure: ENDARTERECTOMY CAROTID with patch angioplasty;  Surgeon: Mal Misty, MD;  Location:  [facility] OR;  Service: Vascular;  Laterality: Left;  . LEFT HEART CATHETERIZATION WITH CORONARY/GRAFT ANGIOGRAM N/A 12/21/2011   Procedure: LEFT HEART CATHETERIZATION WITH Beatrix Fetters;  Surgeon: Hillary Bow, MD;  Location: [facility] CATH LAB;  Service: Cardiovascular;  Laterality: N/A;  . TONSILLECTOMY      Family History  Problem Relation Age of Onset  . Heart disease Father        mother  . Heart attack Father   . Diabetes Sister        mother  . Kidney disease Sister   . Hypertension Son   . Hyperlipidemia Son   . Hypertension Son   . Diabetes Mother   . Heart disease Mother   . Diabetes Son   . Aneurysm Other   . Hyperlipidemia Other   . Liver cancer Other        grandfather    SOCIAL HISTORY: Social History   Tobacco Use  . Smoking status: Former Smoker     Types: Cigarettes    Start date: 01/03/1955  . Smokeless tobacco: Never Used  Substance Use Topics  . Alcohol use: Not Currently    Comment: occ    Allergies  Allergen Reactions  . Atenolol Other (See Comments)    depression  . Rosuvastatin Other (See Comments)    Severe hip pain  . Simvastatin Other (See Comments)    Severe hip pain  . Ultram [Tramadol] Nausea And Vomiting  . Zofran [Ondansetron Hcl] Nausea Only and Other (See Comments)    Severe nausea and constipation  . Adhesive [Tape] Other (See Comments)    Breakdown skin  . Codeine Other (See Comments)    Crazy feeling  . Ezetimibe-Simvastatin Other (See Comments)    Severe hip pain    Current Outpatient Medications  Medication Sig Dispense Refill  . amLODipine (NORVASC) 5 MG tablet TAKE 1 TABLET BY MOUTH EVERY DAY 90 tablet 2  . aspirin (ASPIRIN EC) 81 MG EC tablet Take 81 mg by mouth at bedtime.     . Calcium Carb-Cholecalciferol (CALCIUM 600+D3 PO) Take 1 tablet by mouth 2 (two) times daily.    . clopidogrel (PLAVIX) 75 MG tablet TAKE 1 TABLET BY MOUTH EVERY DAY 90 tablet 3  . donepezil (ARICEPT) 10 MG tablet TAKE 1 TABLET BY MOUTH EVERYDAY AT BEDTIME 90 tablet 1  . nitroGLYCERIN (NITROSTAT) 0.4 MG SL tablet Place 1 tablet (0.4 mg total) under the tongue every 5 (five) minutes as needed for chest pain. 75 tablet 2  . OVER THE COUNTER MEDICATION Take 2 tablets by mouth 2 (two) times daily. Focus factor brain health memory dietary supplement    . pantoprazole (PROTONIX) 40 MG tablet TAKE 1 TABLET BY MOUTH EVERY DAY 90 tablet 2  . polycarbophil (FIBERCON) 625 MG tablet Take 625 mg by mouth 2 (two) times daily.    . pravastatin (PRAVACHOL) 40 MG tablet TAKE 1 TABLET BY MOUTH EVERY DAY IN THE EVENING 90 tablet 2  . tamsulosin (FLOMAX) 0.4 MG CAPS capsule TAKE 1 CAPSULE (0.4 MG TOTAL) BY MOUTH DAILY AFTER SUPPER. 90 capsule 1   No current facility-administered medications for this visit.    REVIEW OF SYSTEMS:  [X]   denotes positive finding, [ ]  denotes negative finding Cardiac  Comments:  Chest pain or chest pressure:    Shortness of breath upon exertion:    Short of breath when lying flat:    Irregular heart rhythm:        Vascular  Pain in calf, thigh, or hip brought on by ambulation:    Pain in feet at night that wakes you up from your sleep:     Blood clot in your veins:    Leg swelling:         Pulmonary    Oxygen at home:    Productive cough:     Wheezing:         Neurologic    Sudden weakness in arms or legs:     Sudden numbness in arms or legs:     Sudden onset of difficulty speaking or slurred speech:    Temporary loss of vision in one eye:     Problems with dizziness:         Gastrointestinal    Blood in stool:     Vomited blood:         Genitourinary    Burning when urinating:     Blood in urine:        Psychiatric    Major depression:         Hematologic    Bleeding problems:    Problems with blood clotting too easily:        Skin    Rashes or ulcers:        Constitutional    Fever or chills:      PHYSICAL EXAM: Vitals:   09/04/19 1512 09/04/19 1516  BP: 108/64 110/61  Pulse: 69 69  Resp: 18   Temp: 97.7 F (36.5 C)   TempSrc: Temporal   SpO2: 95%   Weight: 187 lb (84.8 kg)   Height: 5\' 9"  (1.753 m)     GENERAL: The patient is a well-nourished male, in no acute distress. The vital signs are documented above. CARDIAC: There is a regular rate and rhythm.  VASCULAR:  Carotid pulse palpable bilaterally Left neck incision well-healed PULMONARY: There is good air exchange bilaterally without wheezing or rales. ABDOMEN: Soft and non-tender with normal pitched bowel sounds.  MUSCULOSKELETAL: There are no major deformities or cyanosis. NEUROLOGIC: No focal weakness or paresthesias are detected.  Cranial nerves II through XII grossly intact. SKIN: There are no ulcers or rashes noted. PSYCHIATRIC: The patient has a normal affect.  DATA:   Carotid  duplex today shows widely patent left carotid endarterectomy site with some intimal thickening and on the right he has more elevated velocities but still in the 1 to 39% range based on velocity criteria.  Assessment/Plan:  86 year old male well-known to the vascular surgery service that presents for ongoing surveillance of his carotid artery disease.  Initially had a left carotid enterectomy in 2014 for asymptomatic disease with Dr. Kellie Simmering.  On surveillance today the left carotid endarterectomy site remains patent and he still has velocity criteria on the right in 1 the 39% range (although more elevated).  Otherwise remains asymptomatic and discussed with him and his son that unless he has more than 80% stenosis in setting of asymptomatic disease would not recommend any surgical intervention.  Would continue surveillance for now and we will plan to see him again in 1 year for repeat carotid duplex.  He is getting appropriate medical managed with statin and aspirin at this time.   Marty Heck, MD [facility] Office: [phone]

## 2019-09-05 DIAGNOSIS — Z1159 Encounter for screening for other viral diseases: Secondary | ICD-10-CM | POA: Diagnosis not present

## 2019-09-05 DIAGNOSIS — Z20828 Contact with and (suspected) exposure to other viral communicable diseases: Secondary | ICD-10-CM | POA: Diagnosis not present

## 2019-09-12 DIAGNOSIS — Z1159 Encounter for screening for other viral diseases: Secondary | ICD-10-CM | POA: Diagnosis not present

## 2019-09-12 DIAGNOSIS — Z20828 Contact with and (suspected) exposure to other viral communicable diseases: Secondary | ICD-10-CM | POA: Diagnosis not present

## 2019-09-19 DIAGNOSIS — Z20828 Contact with and (suspected) exposure to other viral communicable diseases: Secondary | ICD-10-CM | POA: Diagnosis not present

## 2019-09-19 DIAGNOSIS — Z1159 Encounter for screening for other viral diseases: Secondary | ICD-10-CM | POA: Diagnosis not present

## 2019-09-26 DIAGNOSIS — Z1159 Encounter for screening for other viral diseases: Secondary | ICD-10-CM | POA: Diagnosis not present

## 2019-09-26 DIAGNOSIS — Z20828 Contact with and (suspected) exposure to other viral communicable diseases: Secondary | ICD-10-CM | POA: Diagnosis not present

## 2019-09-27 ENCOUNTER — Ambulatory Visit (INDEPENDENT_AMBULATORY_CARE_PROVIDER_SITE_OTHER): Payer: Medicare PPO | Admitting: Dermatology

## 2019-09-27 ENCOUNTER — Other Ambulatory Visit: Payer: Self-pay

## 2019-09-27 ENCOUNTER — Encounter: Payer: Self-pay | Admitting: Dermatology

## 2019-09-27 DIAGNOSIS — D099 Carcinoma in situ, unspecified: Secondary | ICD-10-CM

## 2019-09-27 DIAGNOSIS — C44329 Squamous cell carcinoma of skin of other parts of face: Secondary | ICD-10-CM | POA: Diagnosis not present

## 2019-09-27 DIAGNOSIS — L988 Other specified disorders of the skin and subcutaneous tissue: Secondary | ICD-10-CM | POA: Diagnosis not present

## 2019-09-27 NOTE — Patient Instructions (Signed)

## 2019-10-03 DIAGNOSIS — Z1159 Encounter for screening for other viral diseases: Secondary | ICD-10-CM | POA: Diagnosis not present

## 2019-10-03 DIAGNOSIS — Z20828 Contact with and (suspected) exposure to other viral communicable diseases: Secondary | ICD-10-CM | POA: Diagnosis not present

## 2019-10-04 ENCOUNTER — Ambulatory Visit: Payer: Medicare PPO

## 2019-10-04 ENCOUNTER — Ambulatory Visit (INDEPENDENT_AMBULATORY_CARE_PROVIDER_SITE_OTHER): Payer: Medicare PPO

## 2019-10-04 ENCOUNTER — Other Ambulatory Visit: Payer: Self-pay

## 2019-10-04 DIAGNOSIS — Z4802 Encounter for removal of sutures: Secondary | ICD-10-CM

## 2019-10-04 NOTE — Progress Notes (Signed)
No signs or symptoms of infection no sutures present due to patient combs his beard.

## 2019-10-10 DIAGNOSIS — Z1159 Encounter for screening for other viral diseases: Secondary | ICD-10-CM | POA: Diagnosis not present

## 2019-10-10 DIAGNOSIS — Z20828 Contact with and (suspected) exposure to other viral communicable diseases: Secondary | ICD-10-CM | POA: Diagnosis not present

## 2019-10-11 DIAGNOSIS — H401132 Primary open-angle glaucoma, bilateral, moderate stage: Secondary | ICD-10-CM | POA: Diagnosis not present

## 2019-10-16 DIAGNOSIS — Z111 Encounter for screening for respiratory tuberculosis: Secondary | ICD-10-CM | POA: Diagnosis not present

## 2019-10-17 DIAGNOSIS — Z1159 Encounter for screening for other viral diseases: Secondary | ICD-10-CM | POA: Diagnosis not present

## 2019-10-17 DIAGNOSIS — Z20828 Contact with and (suspected) exposure to other viral communicable diseases: Secondary | ICD-10-CM | POA: Diagnosis not present

## 2019-10-22 DIAGNOSIS — M1712 Unilateral primary osteoarthritis, left knee: Secondary | ICD-10-CM | POA: Diagnosis not present

## 2019-10-24 DIAGNOSIS — Z1159 Encounter for screening for other viral diseases: Secondary | ICD-10-CM | POA: Diagnosis not present

## 2019-10-24 DIAGNOSIS — Z20828 Contact with and (suspected) exposure to other viral communicable diseases: Secondary | ICD-10-CM | POA: Diagnosis not present

## 2019-10-29 ENCOUNTER — Encounter: Payer: Self-pay | Admitting: Dermatology

## 2019-10-31 DIAGNOSIS — Z20828 Contact with and (suspected) exposure to other viral communicable diseases: Secondary | ICD-10-CM | POA: Diagnosis not present

## 2019-10-31 DIAGNOSIS — Z1159 Encounter for screening for other viral diseases: Secondary | ICD-10-CM | POA: Diagnosis not present

## 2019-11-07 DIAGNOSIS — Z1159 Encounter for screening for other viral diseases: Secondary | ICD-10-CM | POA: Diagnosis not present

## 2019-11-07 DIAGNOSIS — Z20828 Contact with and (suspected) exposure to other viral communicable diseases: Secondary | ICD-10-CM | POA: Diagnosis not present
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
## 2019-11-09 ENCOUNTER — Ambulatory Visit: Payer: Medicare PPO | Admitting: Neurology

## 2019-11-09 ENCOUNTER — Encounter: Payer: Self-pay | Admitting: Neurology

## 2019-11-09 ENCOUNTER — Other Ambulatory Visit: Payer: Self-pay

## 2019-11-09 VITALS — BP 112/63 | HR 66 | Ht 71.0 in | Wt 185.4 lb

## 2019-11-09 DIAGNOSIS — E785 Hyperlipidemia, unspecified: Secondary | ICD-10-CM | POA: Diagnosis not present

## 2019-11-09 DIAGNOSIS — G309 Alzheimer's disease, unspecified: Secondary | ICD-10-CM

## 2019-11-09 DIAGNOSIS — I6523 Occlusion and stenosis of bilateral carotid arteries: Secondary | ICD-10-CM | POA: Diagnosis not present

## 2019-11-09 DIAGNOSIS — F0280 Dementia in other diseases classified elsewhere without behavioral disturbance: Secondary | ICD-10-CM | POA: Diagnosis not present

## 2019-11-09 DIAGNOSIS — I10 Essential (primary) hypertension: Secondary | ICD-10-CM | POA: Diagnosis not present

## 2019-11-09 DIAGNOSIS — F0150 Vascular dementia without behavioral disturbance: Secondary | ICD-10-CM | POA: Diagnosis not present

## 2019-11-09 DIAGNOSIS — I25810 Atherosclerosis of coronary artery bypass graft(s) without angina pectoris: Secondary | ICD-10-CM | POA: Diagnosis not present

## 2019-11-09 NOTE — Patient Instructions (Signed)
1.  We will discontinue donepezil due to low pulse 2.  Discuss with Dr. Dillard Essex whether memantine would be an appropriate alternative. 3.  Follow up in 9 months.

## 2019-11-10 NOTE — Progress Notes (Signed)
   Follow-Up Visit   Subjective  Sean Lozano is a 86 y.o. male who presents for the following: Procedure (Tx scc x1 right cheek.).  SCCA Location:  Duration:  Quality:  Associated Signs/Symptoms: Modifying Factors:  Severity:  Timing: Context: For treatment  Objective  Well appearing patient in no apparent distress; mood and affect are within normal limits.  A focused examination was performed including Head and neck.. Relevant physical exam findings are noted in the Assessment and Plan.   Assessment & Plan    Squamous cell carcinoma in situ Right Buccal Cheek   Skin excision  Lesion length (cm):  1 Lesion width (cm):  1.6 Margin per side (cm):  0 Total excision diameter (cm):  1.6 Informed consent: discussed and consent obtained   Timeout: patient name, date of birth, surgical site, and procedure verified   Procedure prep:  Patient was prepped and draped in usual sterile fashion Prep type:  Chlorhexidine Anesthesia: the lesion was anesthetized in a standard fashion   Anesthetic:  1% lidocaine w/ epinephrine 1-100,000 local infiltration Instrument used: #15 blade   Hemostasis achieved with: suture   Outcome: patient tolerated procedure well with no complications   Post-procedure details: sterile dressing applied and wound care instructions given   Dressing type: petrolatum   Additional details:  CX3   Destruction of lesion  Specimen 1 - Surgical pathology Differential Diagnosis: mod diff SCC  Check Margins: superior margin  Scaly pink papule or plaque.  [id number]      Robb Matar, MD, have reviewed all documentation for this visit.  The documentation on 11/10/19 for the exam, diagnosis, procedures, and orders are all accurate and complete.

## 2019-11-11 NOTE — Addendum Note (Signed)
Addended by: Lavonna Monarch on: 11/11/2019 09:11 PM   Modules accepted: Orders, Level of Service

## 2019-11-11 NOTE — Addendum Note (Signed)
Addended by: Lavonna Monarch on: 11/11/2019 07:04 PM   Modules accepted: Level of Service

## 2019-11-14 DIAGNOSIS — Z1159 Encounter for screening for other viral diseases: Secondary | ICD-10-CM | POA: Diagnosis not present

## 2019-11-14 DIAGNOSIS — Z20828 Contact with and (suspected) exposure to other viral communicable diseases: Secondary | ICD-10-CM | POA: Diagnosis not present

## 2019-11-15 ENCOUNTER — Other Ambulatory Visit: Payer: Self-pay

## 2019-11-15 ENCOUNTER — Encounter (HOSPITAL_COMMUNITY): Payer: Self-pay | Admitting: Emergency Medicine

## 2019-11-15 ENCOUNTER — Emergency Department (HOSPITAL_COMMUNITY): Payer: Medicare PPO

## 2019-11-15 ENCOUNTER — Emergency Department (HOSPITAL_COMMUNITY)
Admission: EM | Admit: 2019-11-15 | Discharge: 2019-11-15 | Disposition: A | Discharge status: Home / Self Care | Payer: Medicare PPO | Attending: Emergency Medicine | Admitting: Emergency Medicine

## 2019-11-15 DIAGNOSIS — S12601A Unspecified nondisplaced fracture of seventh cervical vertebra, initial encounter for closed fracture: Secondary | ICD-10-CM | POA: Diagnosis not present

## 2019-11-15 DIAGNOSIS — S12500A Unspecified displaced fracture of sixth cervical vertebra, initial encounter for closed fracture: Secondary | ICD-10-CM | POA: Diagnosis not present

## 2019-11-15 DIAGNOSIS — S0083XA Contusion of other part of head, initial encounter: Secondary | ICD-10-CM | POA: Diagnosis not present

## 2019-11-15 DIAGNOSIS — S0093XA Contusion of unspecified part of head, initial encounter: Secondary | ICD-10-CM | POA: Diagnosis not present

## 2019-11-15 DIAGNOSIS — W010XXA Fall on same level from slipping, tripping and stumbling without subsequent striking against object, initial encounter: Secondary | ICD-10-CM | POA: Insufficient documentation

## 2019-11-15 DIAGNOSIS — R079 Chest pain, unspecified: Secondary | ICD-10-CM | POA: Insufficient documentation

## 2019-11-15 DIAGNOSIS — R21 Rash and other nonspecific skin eruption: Secondary | ICD-10-CM | POA: Diagnosis not present

## 2019-11-15 DIAGNOSIS — Y9389 Activity, other specified: Secondary | ICD-10-CM | POA: Insufficient documentation

## 2019-11-15 DIAGNOSIS — S0003XA Contusion of scalp, initial encounter: Secondary | ICD-10-CM | POA: Diagnosis not present

## 2019-11-15 DIAGNOSIS — Y92003 Bedroom of unspecified non-institutional (private) residence as the place of occurrence of the external cause: Secondary | ICD-10-CM | POA: Diagnosis not present

## 2019-11-15 DIAGNOSIS — F0390 Unspecified dementia without behavioral disturbance: Secondary | ICD-10-CM | POA: Diagnosis not present

## 2019-11-15 DIAGNOSIS — R55 Syncope and collapse: Secondary | ICD-10-CM | POA: Diagnosis not present

## 2019-11-15 DIAGNOSIS — W19XXXA Unspecified fall, initial encounter: Secondary | ICD-10-CM | POA: Diagnosis not present

## 2019-11-15 DIAGNOSIS — R52 Pain, unspecified: Secondary | ICD-10-CM | POA: Diagnosis not present

## 2019-11-15 DIAGNOSIS — Y999 Unspecified external cause status: Secondary | ICD-10-CM | POA: Insufficient documentation

## 2019-11-15 DIAGNOSIS — S0990XA Unspecified injury of head, initial encounter: Secondary | ICD-10-CM | POA: Diagnosis present

## 2019-11-15 DIAGNOSIS — R42 Dizziness and giddiness: Secondary | ICD-10-CM | POA: Insufficient documentation

## 2019-11-15 DIAGNOSIS — M791 Myalgia, unspecified site: Secondary | ICD-10-CM | POA: Insufficient documentation

## 2019-11-15 DIAGNOSIS — J9811 Atelectasis: Secondary | ICD-10-CM | POA: Diagnosis not present

## 2019-11-15 DIAGNOSIS — R0902 Hypoxemia: Secondary | ICD-10-CM | POA: Diagnosis not present

## 2019-11-15 MED ORDER — ACETAMINOPHEN 325 MG PO TABS
650.0000 mg | ORAL_TABLET | Freq: Once | ORAL | Status: AC
  Administered 2019-11-15: 650 mg via ORAL
  Filled 2019-11-15: qty 2

## 2019-11-15 MED ORDER — ACETAMINOPHEN 325 MG PO TABS: 650.0000 mg | ORAL_TABLET | Freq: Four times a day (QID) | ORAL | 1 refills | Status: DC | PRN

## 2019-11-15 NOTE — ED Notes (Signed)
Pt transferred to CT.

## 2019-11-15 NOTE — Discharge Instructions (Signed)
Please call Dr Marchelle Folks office tomorrow to schedule an appointment in 2 weeks for your spinal fracture.  This is a neurosurgeon who will manage your spinal fracture.  You should wear your collar at ALL times at home, including showering and sleeping.    This fracture can take 6-12 weeks to heal.  The spinal surgeons will decide on next steps when you see them.  You can take tylenol 650 mg every 6 hours for pain at home.  Your hematoma (goose egg) on your forehead can take 2 weeks to resolve.  You can put ice packs on it to help with swelling and pain, for 10 minutes at a time.  *  Finally, I recommend that you are seen by a physical therapist at your facility.  Your injuries and your spinal collar put you at higher risk for falling again.

## 2019-11-15 NOTE — ED Triage Notes (Addendum)
Pt presents to ED BIB [facility] from [facility]. Pt c/o pain to forehead, R chest, R abd. Pt reports trip fall at ground level. Pt fell prone. Hematoma on center of forehead. Pt on plavix. Pupils PERRLA, no neuro deficits

## 2019-11-15 NOTE — ED Notes (Signed)
Discharge instructions discussed with pt. Pt's son verbalized understanding. Pt stable and ambulatory. No signature pad avialable. Attempted to call report to [facility]. No answer

## 2019-11-15 NOTE — Progress Notes (Signed)
Orthopedic Tech Progress Note Patient Details:  Sean Lozano 1934-01-23 [id number] Level 2 Trauma not needed. Patient ID: Sean Lozano, male   DOB: 03/15/1934, 86 y.o.   MRN: [id number]   Sean Lozano 11/15/2019, 8:21 PM

## 2019-11-15 NOTE — ED Provider Notes (Signed)
[facility] EMERGENCY DEPARTMENT Provider Note   CSN: [id number] Arrival date & time: 11/15/19  2008     History Chief Complaint  Patient presents with  . Fall  . LEVEL 2    Sean Lozano is a 86 y.o. male with history of dementia, coronary disease on Plavix, presenting from [facility] independent living with mechanical fall.  Patient ports he lost his footing and tripped in his bedroom today.  He fell forward on the hardwood flooring and struck his forehead directly on the ground.  There was no loss of consciousness.  He believes he was on the ground for about an hour.  He is also reporting right sided chest pain and rib pain.  He denies any pain in his neck, lower back, hips, extremities.  He reports he does have a headache.  HPI     History reviewed. No pertinent past medical history.  There are no problems to display for this patient.   History reviewed. No pertinent family history.  Social History   Tobacco Use  . Smoking status: Not on file  Substance Use Topics  . Alcohol use: Not on file  . Drug use: Not on file    Home Medications Prior to Admission medications   Medication Sig Start Date End Date Taking? Authorizing Provider  acetaminophen (TYLENOL) 325 MG tablet Take 2 tablets (650 mg total) by mouth every 6 (six) hours as needed for up to 30 doses for mild pain or moderate pain. 11/15/19   Wyvonnia Dusky, MD    Allergies    Rosuvastatin  Review of Systems   Review of Systems  Constitutional: Negative for chills and fever.  HENT: Negative for ear pain and sore throat.   Eyes: Negative for photophobia and visual disturbance.  Respiratory: Negative for cough and shortness of breath.   Cardiovascular: Positive for chest pain. Negative for palpitations.  Gastrointestinal: Negative for abdominal pain and vomiting.  Genitourinary: Negative for dysuria and hematuria.  Musculoskeletal: Positive for arthralgias and myalgias.  Skin:  Positive for rash and wound. Negative for color change.  Neurological: Positive for light-headedness and headaches. Negative for facial asymmetry, speech difficulty and weakness.  Psychiatric/Behavioral: Negative for agitation and confusion.  All other systems reviewed and are negative.   Physical Exam Updated Vital Signs BP (!) 156/72   Pulse 65   Temp (!) 97.5 F (36.4 C) (Oral)   Resp (!) 24   Ht 5\' 11"  (1.803 m)   Wt 81.6 kg   SpO2 96%   BMI 25.10 kg/m   Physical Exam Vitals and nursing note reviewed.  Constitutional:      Appearance: He is well-developed.  HENT:     Head: Normocephalic.     Comments: Large centralized forehead hematoma Eyes:     Conjunctiva/sclera: Conjunctivae normal.  Neck:     Comments: C spine collar in place No spinal midline tenderness Cardiovascular:     Rate and Rhythm: Normal rate and regular rhythm.     Pulses: Normal pulses.     Comments: Right anterior/lateral ribline tenderness Pulmonary:     Effort: Pulmonary effort is normal. No respiratory distress.     Breath sounds: Normal breath sounds.  Abdominal:     General: There is no distension.     Palpations: Abdomen is soft.     Tenderness: There is no abdominal tenderness.  Skin:    General: Skin is warm and dry.  Neurological:     Mental Status:  He is alert.     GCS: GCS eye subscore is 4. GCS verbal subscore is 5. GCS motor subscore is 6.     Cranial Nerves: Cranial nerves are intact.     Sensory: Sensation is intact.     Motor: Motor function is intact. No weakness or tremor.     Gait: Gait is intact.  Psychiatric:        Mood and Affect: Mood normal.        Behavior: Behavior normal.     ED Results / Procedures / Treatments   Labs (all labs ordered are listed, but only abnormal results are displayed) Labs Reviewed - No data to display  EKG None  Radiology CT Head Wo Contrast  Result Date: 11/15/2019 CLINICAL DATA:  Head trauma, minor. Additional provided:  Mechanical trip and fall today, abrasion to front of head, on blood thinners EXAM: CT HEAD WITHOUT CONTRAST CT CERVICAL SPINE WITHOUT CONTRAST TECHNIQUE: Multidetector CT imaging of the head and cervical spine was performed following the standard protocol without intravenous contrast. Multiplanar CT image reconstructions of the cervical spine were also generated. COMPARISON:  Head CT 06/14/2018, CT of the cervical spine 07/09/2012. FINDINGS: CT HEAD FINDINGS Brain: Stable, moderate generalized parenchymal atrophy. Stable, moderate patchy hypoattenuation within the cerebral white matter which is nonspecific, but consistent with chronic small vessel ischemic disease. There is no acute intracranial hemorrhage. No demarcated cortical infarct. No extra-axial fluid collection. No evidence of intracranial mass. No midline shift. Vascular: No hyperdense vessel.  Atherosclerotic calcifications. Skull: Normal. Negative for fracture or focal lesion. Sinuses/Orbits: Visualized orbits show no acute finding. No significant paranasal sinus disease or mastoid effusion at the imaged levels. Other: Sizable midline frontal scalp/forehead hematoma. CT CERVICAL SPINE FINDINGS Alignment: Straightening of the expected cervical lordosis. No significant spondylolisthesis. Skull base and vertebrae: The basion-dental and atlanto-dental intervals are maintained. Sequela of prior C3-C6 ACDF. ACDF hardware remains at the C4-C6 levels. No evidence of hardware compromise. There is an acute fracture which extends through a C6-C7 ventral enthesophyte and the anterior aspect of the C6-C7 disc space. Nondisplaced fracture components also extend horizontally and obliquely through the C7 vertebra (for instance as seen on series 9, image 34) (series 10, images 30 8-41). Soft tissues and spinal canal: No prevertebral fluid or swelling. No visible canal hematoma. Disc levels: Sequela of prior C3-C6 ACDF. Additionally, there are fusing bridging  osteophytes/enthesophytes anteriorly and posteriorly at C2-C3. Multilevel fusing ventral osteophytes at the C7 and visualized more caudal levels. Additionally, there is multilevel fusion of the posterior elements. Multilevel vertebral osteophytosis/ossification of the posterior longitudinal ligament. There is at least moderate bony spinal canal stenosis at C3-C4 and C6-C7. Multilevel bony neural foraminal narrowing. Upper chest: No consolidation within the imaged lung apices. No visible pneumothorax. These results were called by telephone at the time of interpretation on 11/15/2019 at 9:10 pm to provider MATTHEW TRIFAN , who verbally acknowledged these results. IMPRESSION: CT head: 1. No evidence of acute intracranial abnormality. 2. Sizable midline frontal scalp/forehead hematoma. 3. Stable moderate generalized parenchymal atrophy and chronic small vessel ischemic disease. CT cervical spine: 1. Acute fracture traversing a ventral C6-C7 enthesophyte and at least portions of the C6-C7 disc space. Nondisplaced acute fractures also extend horizontally and obliquely through the C7 vertebra toward the inferior endplate. Given the appearance of these fractures, there is high suspicion for ligamentous injury and MRI of the cervical spine is recommended for further evaluation. 2. Sequela of prior ACDF at the C3-C6 levels. ACDF  hardware remains at the C4-C6 levels. No evidence of acute hardware compromise. 3. Superimposed cervical spondylosis and ossification of the posterior longitudinal ligament. There is at least moderate bony spinal canal stenosis at C3-C4 and C6-C7. Electronically Signed   By: Kellie Simmering DO   On: 11/15/2019 21:11   CT Cervical Spine Wo Contrast  Result Date: 11/15/2019 CLINICAL DATA:  Head trauma, minor. Additional provided: Mechanical trip and fall today, abrasion to front of head, on blood thinners EXAM: CT HEAD WITHOUT CONTRAST CT CERVICAL SPINE WITHOUT CONTRAST TECHNIQUE: Multidetector CT imaging  of the head and cervical spine was performed following the standard protocol without intravenous contrast. Multiplanar CT image reconstructions of the cervical spine were also generated. COMPARISON:  Head CT 06/14/2018, CT of the cervical spine 07/09/2012. FINDINGS: CT HEAD FINDINGS Brain: Stable, moderate generalized parenchymal atrophy. Stable, moderate patchy hypoattenuation within the cerebral white matter which is nonspecific, but consistent with chronic small vessel ischemic disease. There is no acute intracranial hemorrhage. No demarcated cortical infarct. No extra-axial fluid collection. No evidence of intracranial mass. No midline shift. Vascular: No hyperdense vessel.  Atherosclerotic calcifications. Skull: Normal. Negative for fracture or focal lesion. Sinuses/Orbits: Visualized orbits show no acute finding. No significant paranasal sinus disease or mastoid effusion at the imaged levels. Other: Sizable midline frontal scalp/forehead hematoma. CT CERVICAL SPINE FINDINGS Alignment: Straightening of the expected cervical lordosis. No significant spondylolisthesis. Skull base and vertebrae: The basion-dental and atlanto-dental intervals are maintained. Sequela of prior C3-C6 ACDF. ACDF hardware remains at the C4-C6 levels. No evidence of hardware compromise. There is an acute fracture which extends through a C6-C7 ventral enthesophyte and the anterior aspect of the C6-C7 disc space. Nondisplaced fracture components also extend horizontally and obliquely through the C7 vertebra (for instance as seen on series 9, image 34) (series 10, images 30 8-41). Soft tissues and spinal canal: No prevertebral fluid or swelling. No visible canal hematoma. Disc levels: Sequela of prior C3-C6 ACDF. Additionally, there are fusing bridging osteophytes/enthesophytes anteriorly and posteriorly at C2-C3. Multilevel fusing ventral osteophytes at the C7 and visualized more caudal levels. Additionally, there is multilevel fusion of  the posterior elements. Multilevel vertebral osteophytosis/ossification of the posterior longitudinal ligament. There is at least moderate bony spinal canal stenosis at C3-C4 and C6-C7. Multilevel bony neural foraminal narrowing. Upper chest: No consolidation within the imaged lung apices. No visible pneumothorax. These results were called by telephone at the time of interpretation on 11/15/2019 at 9:10 pm to provider MATTHEW TRIFAN , who verbally acknowledged these results. IMPRESSION: CT head: 1. No evidence of acute intracranial abnormality. 2. Sizable midline frontal scalp/forehead hematoma. 3. Stable moderate generalized parenchymal atrophy and chronic small vessel ischemic disease. CT cervical spine: 1. Acute fracture traversing a ventral C6-C7 enthesophyte and at least portions of the C6-C7 disc space. Nondisplaced acute fractures also extend horizontally and obliquely through the C7 vertebra toward the inferior endplate. Given the appearance of these fractures, there is high suspicion for ligamentous injury and MRI of the cervical spine is recommended for further evaluation. 2. Sequela of prior ACDF at the C3-C6 levels. ACDF hardware remains at the C4-C6 levels. No evidence of acute hardware compromise. 3. Superimposed cervical spondylosis and ossification of the posterior longitudinal ligament. There is at least moderate bony spinal canal stenosis at C3-C4 and C6-C7. Electronically Signed   By: Kellie Simmering DO   On: 11/15/2019 21:11   DG Chest Portable 1 View  Result Date: 11/15/2019 CLINICAL DATA:  Fall EXAM: PORTABLE CHEST 1  VIEW COMPARISON:  06/14/2018 FINDINGS: The heart size and mediastinal contours are within normal limits. Left basilar atelectasis. Lungs otherwise clear. The visualized skeletal structures are unremarkable. Remote median sternotomy. IMPRESSION: Left basilar atelectasis. Electronically Signed   By: Ulyses Jarred M.D.   On: 11/15/2019 20:33    Procedures Procedures (including  critical care time)  Medications Ordered in ED Medications  acetaminophen (TYLENOL) tablet 650 mg (650 mg Oral Given 11/15/19 2102)    ED Course  I have reviewed the triage vital signs and the nursing notes.  Pertinent labs & imaging results that were available during my care of the patient were reviewed by me and considered in my medical decision making (see chart for details).  86 year old male presenting to the emergency department with a fall from standing, found to have a large forehead hematoma as well as right anterior rib line tenderness.  There is no syncope.  This was mechanical fall.  He is on Plavix.  He does have some element of dementia but is living in an independent living community.  He can provide a reliable history.  Plan for CT scan of the head and C-spine.  Also get an x-ray of the chest.  I did not find any other evidence of injuries or trauma to the remainder extremities, including the abdomen and pelvis.  Xray without report of acute fracture, however we cannot exclude possibility of hairline or non-visualized fracture.   He does not have pleuritic pain or significant distress with this, fortunately.  Clinical Course as of Nov 14 2256  Thu Nov 15, 2019  2137 C spine fractures noted, NSGY team contacted, awaiting callback.  Patient reporting only some "soreness" in his neck.  He has no neurological deficits   [MT]  2201 Patient's son Al now at bedside.     [MT]  2251 I spoke to [facility] the [facility] PA working with Dr Arnoldo Morale.  Their team recommends pt can be discharged with hard C-spine collar to be worn at all times and follow up with Dr Annette Stable, the patient's prior spinal surgeon, in 2 weeks.  This was relayed to the patient and his son Al who verbalized understanding.   [MT]  2255 Patient signout reports to me that the patient does have 24-hour nursing care as well as physical therapy available at his facility.  I do recommend to be seen by physical therapist, as he may be  high risk for falls due to his pain in a C-spine collar.  He normally does walk with the assistance of a cane or walker at times.  He is able to ambulate here.   [MT]    Clinical Course User Index [MT] Wyvonnia Dusky, MD    Final Clinical Impression(s) / ED Diagnoses Final diagnoses:  Contusion of face, initial encounter  Traumatic hematoma of forehead, initial encounter  Closed nondisplaced fracture of seventh cervical vertebra, unspecified fracture morphology, initial encounter [facility])    Rx / DC Orders ED Discharge Orders         Ordered    acetaminophen (TYLENOL) 325 MG tablet  Every 6 hours PRN     Discontinue  Reprint     11/15/19 2255           Wyvonnia Dusky, MD 11/15/19 2258

## 2019-11-16 ENCOUNTER — Encounter: Payer: Self-pay | Admitting: Neurology

## 2019-11-16 NOTE — ED Notes (Signed)
c-collar replaced per Dr. Langston Masker to [facility]

## 2019-11-17 ENCOUNTER — Emergency Department (HOSPITAL_COMMUNITY)
Admission: EM | Admit: 2019-11-17 | Discharge: 2019-11-18 | Disposition: A | Discharge status: Home / Self Care | Payer: Medicare PPO | Attending: Emergency Medicine | Admitting: Emergency Medicine

## 2019-11-17 ENCOUNTER — Emergency Department (HOSPITAL_COMMUNITY): Payer: Medicare PPO

## 2019-11-17 DIAGNOSIS — E119 Type 2 diabetes mellitus without complications: Secondary | ICD-10-CM | POA: Insufficient documentation

## 2019-11-17 DIAGNOSIS — J45909 Unspecified asthma, uncomplicated: Secondary | ICD-10-CM | POA: Insufficient documentation

## 2019-11-17 DIAGNOSIS — Z20822 Contact with and (suspected) exposure to covid-19: Secondary | ICD-10-CM | POA: Diagnosis not present

## 2019-11-17 DIAGNOSIS — M5489 Other dorsalgia: Secondary | ICD-10-CM | POA: Diagnosis not present

## 2019-11-17 DIAGNOSIS — I25810 Atherosclerosis of coronary artery bypass graft(s) without angina pectoris: Secondary | ICD-10-CM | POA: Diagnosis not present

## 2019-11-17 DIAGNOSIS — R4182 Altered mental status, unspecified: Secondary | ICD-10-CM | POA: Diagnosis not present

## 2019-11-17 DIAGNOSIS — Z7982 Long term (current) use of aspirin: Secondary | ICD-10-CM | POA: Insufficient documentation

## 2019-11-17 DIAGNOSIS — Z87891 Personal history of nicotine dependence: Secondary | ICD-10-CM | POA: Diagnosis not present

## 2019-11-17 DIAGNOSIS — I110 Hypertensive heart disease with heart failure: Secondary | ICD-10-CM | POA: Insufficient documentation

## 2019-11-17 DIAGNOSIS — R220 Localized swelling, mass and lump, head: Secondary | ICD-10-CM | POA: Diagnosis not present

## 2019-11-17 DIAGNOSIS — I5032 Chronic diastolic (congestive) heart failure: Secondary | ICD-10-CM | POA: Diagnosis not present

## 2019-11-17 DIAGNOSIS — G319 Degenerative disease of nervous system, unspecified: Secondary | ICD-10-CM | POA: Diagnosis not present

## 2019-11-17 DIAGNOSIS — Z79899 Other long term (current) drug therapy: Secondary | ICD-10-CM | POA: Insufficient documentation

## 2019-11-17 DIAGNOSIS — R0902 Hypoxemia: Secondary | ICD-10-CM | POA: Diagnosis not present

## 2019-11-17 DIAGNOSIS — G9389 Other specified disorders of brain: Secondary | ICD-10-CM | POA: Diagnosis not present

## 2019-11-17 DIAGNOSIS — W19XXXA Unspecified fall, initial encounter: Secondary | ICD-10-CM | POA: Diagnosis not present

## 2019-11-17 LAB — COMPREHENSIVE METABOLIC PANEL
ALT: 20 U/L (ref 0–44)
AST: 21 U/L (ref 15–41)
Albumin: 3.8 g/dL (ref 3.5–5.0)
Alkaline Phosphatase: 57 U/L (ref 38–126)
Anion gap: 11 (ref 5–15)
BUN: 22 mg/dL (ref 8–23)
CO2: 21 mmol/L — ABNORMAL LOW (ref 22–32)
Calcium: 9.1 mg/dL (ref 8.9–10.3)
Chloride: 104 mmol/L (ref 98–111)
Creatinine, Ser: 1.24 mg/dL (ref 0.61–1.24)
GFR calc Af Amer: >60 mL/min (ref >60)
GFR calc non Af Amer: 53 mL/min — ABNORMAL LOW (ref >60)
Glucose, Bld: 116 mg/dL — ABNORMAL HIGH (ref 70–99)
Potassium: 3.7 mmol/L (ref 3.5–5.1)
Sodium: 136 mmol/L (ref 135–145)
Total Bilirubin: 0.8 mg/dL (ref 0.3–1.2)
Total Protein: 6.7 g/dL (ref 6.5–8.1)

## 2019-11-17 LAB — CBC WITH DIFFERENTIAL/PLATELET
Abs Immature Granulocytes: 0.06 10*3/uL (ref 0.00–0.07)
Basophils Absolute: 0.1 10*3/uL (ref 0.0–0.1)
Basophils Relative: 1 %
Eosinophils Absolute: 0.2 10*3/uL (ref 0.0–0.5)
Eosinophils Relative: 2 %
HCT: 45.4 % (ref 39.0–52.0)
Hemoglobin: 15.4 g/dL (ref 13.0–17.0)
Immature Granulocytes: 1 %
Lymphocytes Relative: 32 %
Lymphs Abs: 4.2 10*3/uL — ABNORMAL HIGH (ref 0.7–4.0)
MCH: 34.8 pg — ABNORMAL HIGH (ref 26.0–34.0)
MCHC: 33.9 g/dL (ref 30.0–36.0)
MCV: 102.5 fL — ABNORMAL HIGH (ref 80.0–100.0)
Monocytes Absolute: 1.2 10*3/uL — ABNORMAL HIGH (ref 0.1–1.0)
Monocytes Relative: 9 %
Neutro Abs: 7.2 10*3/uL (ref 1.7–7.7)
Neutrophils Relative %: 55 %
Platelets: 154 10*3/uL (ref 150–400)
RBC: 4.43 MIL/uL (ref 4.22–5.81)
RDW: 12.9 % (ref 11.5–15.5)
WBC: 12.9 10*3/uL — ABNORMAL HIGH (ref 4.0–10.5)
nRBC: 0 % (ref 0.0–0.2)

## 2019-11-17 LAB — LACTIC ACID, PLASMA: Lactic Acid, Venous: 1.5 mmol/L (ref 0.5–1.9)

## 2019-11-17 LAB — TSH: TSH: 3.268 u[IU]/mL (ref 0.350–4.500)

## 2019-11-17 LAB — URINALYSIS, ROUTINE W REFLEX MICROSCOPIC
Bilirubin Urine: NEGATIVE
Glucose, UA: NEGATIVE mg/dL
Hgb urine dipstick: NEGATIVE
Ketones, ur: NEGATIVE mg/dL
Leukocytes,Ua: NEGATIVE
Nitrite: NEGATIVE
Protein, ur: NEGATIVE mg/dL
Specific Gravity, Urine: 1.019 (ref 1.005–1.030)
pH: 5 (ref 5.0–8.0)

## 2019-11-17 LAB — SARS CORONAVIRUS 2 BY RT PCR (HOSPITAL ORDER, PERFORMED IN ~~LOC~~ HOSPITAL LAB): SARS Coronavirus 2: NEGATIVE

## 2019-11-17 LAB — AMMONIA: Ammonia: 21 umol/L (ref 9–35)

## 2019-11-17 NOTE — Discharge Instructions (Signed)
If patient has continued symptoms, may need follow-up neurology.  Please follow-up with your primary care physician.  Return to ED for any worsening or concerning symptoms.  Follow concussion guidelines and limit activity based on symptoms; if you feel symptoms are worsening, scale back stimulating activity until able to tolerate.

## 2019-11-17 NOTE — ED Provider Notes (Signed)
[facility] EMERGENCY DEPARTMENT Provider Note   CSN: [id number] Arrival date & time: 11/17/19  2000     History Chief Complaint  Patient presents with  . Altered Mental Status    Sean Lozano is a 86 y.o. male.  HPI   Patient presents with altered mental status from skilled nursing facility for concerns over altered mental status.  The patient reportedly has a normal baseline of alert and oriented x4 however today was found to be disoriented/confused.  Patient did sustain a fall 2 days ago and had work-up that was concerning for cervical neck injury however was discharged in cervical collar and instructed to follow-up with neurosurgery.  In the interim, no reported new falls or injury.  Patient removed his cervical collar earlier tonight and that was concerning to the family.  Patient does take Plavix and aspirin.  Collateral information was obtained from family members at bedside.  It appears that since the patient's fall 2 days ago, he has been slightly confused as presenting tonight.  Prior to the fall, patient was alert and oriented x4 at baseline.     Past Medical History:  Diagnosis Date  . 1st degree AV block   . Arthritis   . Asthma   . BARRETTS ESOPHAGUS 06/02/2007  . Benign prostatic hypertrophy   . Bladder neck obstruction 09/29/2010  . CAD (coronary artery disease)    a. CABG x 6 in 1992. b. s/p DES x 2 to SVG -> OM1/OM2 in 09/2010. c. s/p DES to mLAD 10/2010. d. 12/2011 NSTEMI (off DAPT) s/p PTCA of ISR of DES in SVG-OM, Recs for lifelong DAPT   . Carotid artery disease ([place])    Doppler, May, 2014, 6-30% R. ICA, 16-01% LICA, with recent syncope patient sent for consult with vascular surgery  . Carotid bruit    April, 2014  . Cervical disc disease   . Concussion July 09, 2012   Scalp  . Depression   . Diabetes mellitus    type II-diet  . Diverticulosis   . Echocardiogram    Echo 09/2018: EF 60-65, Gr 1 DD, mild AoV sclerosis, trivial TR  .  Ejection fraction    EF 60%, echo, June, 2012  . Fall at home July 09, 2012  . GERD (gastroesophageal reflux disease)   . Hearing loss 2014  . Hx of CABG    1992  . Hyperlipidemia    intolerance to Crestor Zocor Vytorin. He tolerates Pravachol...low HDL  . Paresthesias    successful surgery by Dr. Earnie Larsson  . Prostatitis   . Rosacea   . Sinus bradycardia    asymptomatic   . SKIN LESION 06/02/2007  . Squamous cell carcinoma of skin 08/22/2019   moderately differentiated on right buccal cheek(excision) margins free  . Subdural hematoma (HCC)    Subdural hematoma secondary to falling off a ladder  April, 2014,  medical therapy,  subdural did not expand while blood levels of ddual antiplatelet therapy decreased over one week.  Episode of chest pain in the hospital,, dual antiplatelet therapy restarted approximately one week after it was held  . Syncope    The patient lost consciousness and hit his head July 09 2012, not clear yet if he had true syncope    Patient Active Problem List   Diagnosis Date Noted  . Bradycardia 06/14/2018  . Fatigue 06/14/2018  . Chest pain 12/02/2017  . Atypical chest pain 12/02/2017  . Epigastric pain   . Essential hypertension   .  Fall   . Weakness due to acute stroke ([place]) 10/20/2017  . Stroke ([place]) 10/20/2017  . Elevated MCV 06/08/2017  . Back pain 06/08/2017  . Bilateral leg pain 12/01/2016  . Knee pain 09/19/2013  . Occlusion and stenosis of carotid artery without mention of cerebral infarction 08/22/2012  . Carotid artery disease ([place])   . Syncope   . Chronic diastolic congestive heart failure, NYHA class 1/ECHO 2010 07/10/2012  . SDH (subdural hematoma) ([place]) 07/09/2012  . Erectile dysfunction 04/01/2011  . Ejection fraction   . CAD (coronary artery disease)   . Hyperlipidemia   . Diabetes ([place])   . Hx of CABG   . Preventative health care 09/29/2010  . Dyspnea 09/29/2010  . Bladder neck obstruction 09/29/2010  . Depression  06/04/2007  . Asthma 06/04/2007  . GERD 06/04/2007  . BENIGN PROSTATIC HYPERTROPHY 06/04/2007  . BARRETTS ESOPHAGUS 06/02/2007  . Rosacea 06/02/2007  . Skin lesion 06/02/2007  . FATIGUE 06/02/2007    Past Surgical History:  Procedure Laterality Date  . APPENDECTOMY    . Barretts Esophagus   20/20/09  . Bladder neck obstruction  09/29/10  . C-spine disc surgery  03/2006  . C4-5 surgery  01/2009  . CARDIAC CATHETERIZATION  July 2012   stent placed  X's  4  . COCCYX FRACTURE SURGERY  1997  . CORONARY ARTERY BYPASS GRAFT  1992  . ENDARTERECTOMY Left 09/07/2012   Procedure: ENDARTERECTOMY CAROTID with patch angioplasty;  Surgeon: Mal Misty, MD;  Location: [facility];  Service: Vascular;  Laterality: Left;  . LEFT HEART CATHETERIZATION WITH CORONARY/GRAFT ANGIOGRAM N/A 12/21/2011   Procedure: LEFT HEART CATHETERIZATION WITH Beatrix Fetters;  Surgeon: Hillary Bow, MD;  Location: [facility] CATH LAB;  Service: Cardiovascular;  Laterality: N/A;  . TONSILLECTOMY         Family History  Problem Relation Age of Onset  . Heart disease Father        mother  . Heart attack Father   . Diabetes Sister        mother  . Kidney disease Sister   . Hypertension Son   . Hyperlipidemia Son   . Hypertension Son   . Diabetes Mother   . Heart disease Mother   . Diabetes Son   . Aneurysm Other   . Hyperlipidemia Other   . Liver cancer Other        grandfather    Social History   Tobacco Use  . Smoking status: Former Smoker    Types: Cigarettes    Start date: 01/03/1955  . Smokeless tobacco: Never Used  Vaping Use  . Vaping Use: Never used  Substance Use Topics  . Alcohol use: Not Currently    Comment: occ  . Drug use: No    Home Medications Prior to Admission medications   Medication Sig Start Date End Date Taking? Authorizing Provider  acetaminophen (TYLENOL) 325 MG tablet Take 2 tablets (650 mg total) by mouth every 6 (six) hours as needed for up to 30 doses for mild pain  or moderate pain. 11/15/19   Wyvonnia Dusky, MD  amLODipine (NORVASC) 5 MG tablet TAKE 1 TABLET BY MOUTH EVERY DAY 07/24/19   Sherren Mocha, MD  aspirin (ASPIRIN EC) 81 MG EC tablet Take 81 mg by mouth at bedtime.     [provider]  clopidogrel (PLAVIX) 75 MG tablet TAKE 1 TABLET BY MOUTH EVERY DAY 05/28/19   Sherren Mocha, MD  nitroGLYCERIN (NITROSTAT) 0.4 MG SL tablet  Place 1 tablet (0.4 mg total) under the tongue every 5 (five) minutes as needed for chest pain. 05/03/18   Sherren Mocha, MD  OVER THE COUNTER MEDICATION Take 2 tablets by mouth 2 (two) times daily. Focus factor brain health memory dietary supplement    [provider]  pantoprazole (PROTONIX) 40 MG tablet TAKE 1 TABLET BY MOUTH EVERY DAY 07/18/19   Sherren Mocha, MD  polycarbophil (FIBERCON) 625 MG tablet Take 625 mg by mouth 2 (two) times daily.    [provider]  pravastatin (PRAVACHOL) 40 MG tablet TAKE 1 TABLET BY MOUTH EVERY DAY IN THE EVENING 07/02/19   Sherren Mocha, MD  tamsulosin (FLOMAX) 0.4 MG CAPS capsule TAKE 1 CAPSULE (0.4 MG TOTAL) BY MOUTH DAILY AFTER SUPPER. 06/25/19   Biagio Borg, MD    Allergies    Atenolol, Rosuvastatin, Simvastatin, Ultram [tramadol], Zofran [ondansetron hcl], Adhesive [tape], Codeine, and Ezetimibe-simvastatin  Review of Systems   Review of Systems  Constitutional: Negative for chills and fever.  HENT: Negative for ear pain and sore throat.   Eyes: Negative for pain and visual disturbance.  Respiratory: Negative for cough and shortness of breath.   Cardiovascular: Negative for chest pain and palpitations.  Gastrointestinal: Negative for abdominal pain and vomiting.  Genitourinary: Negative for dysuria and hematuria.  Musculoskeletal: Negative for arthralgias and back pain.  Skin: Negative for color change and rash.  Neurological: Negative for seizures and syncope.  Psychiatric/Behavioral: Positive for confusion.  All other systems reviewed and are  negative.   Physical Exam Updated Vital Signs BP (!) 143/77 (BP Location: Right Arm)   Pulse 62   Temp (!) 97.5 F (36.4 C) (Oral)   Resp 18   SpO2 96%   Physical Exam Vitals and nursing note reviewed.  Constitutional:      General: He is not in acute distress.    Appearance: Normal appearance. He is well-developed and normal weight. He is ill-appearing. He is not toxic-appearing.     Interventions: Cervical collar in place.  HENT:     Head: Normocephalic and atraumatic.  Eyes:     Extraocular Movements: Extraocular movements intact.     Conjunctiva/sclera: Conjunctivae normal.     Pupils: Pupils are equal, round, and reactive to light.  Neck:     Comments: Cervical collar in place.  Cardiovascular:     Rate and Rhythm: Normal rate and regular rhythm.     Heart sounds: No murmur heard.   Pulmonary:     Effort: Pulmonary effort is normal. No respiratory distress.     Breath sounds: Normal breath sounds.  Abdominal:     General: There is no distension.     Palpations: Abdomen is soft.     Tenderness: There is no abdominal tenderness.  Musculoskeletal:     Cervical back: Decreased range of motion.  Skin:    General: Skin is warm and dry.  Neurological:     Mental Status: He is alert. He is disoriented and confused.     GCS: GCS eye subscore is 4. GCS verbal subscore is 4. GCS motor subscore is 6.     Cranial Nerves: No cranial nerve deficit.     Motor: No weakness, abnormal muscle tone or seizure activity.  Psychiatric:        Mood and Affect: Mood normal.        Behavior: Behavior normal.     ED Results / Procedures / Treatments   Labs (all labs ordered are listed,  but only abnormal results are displayed) Labs Reviewed  CULTURE, BLOOD (ROUTINE X 2)  CULTURE, BLOOD (ROUTINE X 2)  CBC WITH DIFFERENTIAL/PLATELET  COMPREHENSIVE METABOLIC PANEL  TSH  AMMONIA  URINALYSIS, ROUTINE W REFLEX MICROSCOPIC  LACTIC ACID, PLASMA  LACTIC ACID, PLASMA     EKG None  Radiology No results found.  Procedures Procedures (including critical care time)  Medications Ordered in ED Medications - No data to display  ED Course  Sean Lozano is a 86 y.o. male with PMHx listed that presents to the Emergency Department complaint of Altered Mental Status    ED Course: Initial exam completed.   Chronically ill-appearing but hemodynamically stable.  Nontoxic and afebrile.  Physical exam significant for 86 year old male with no evidence of new external traumatic injury, cervical collar in place, no obvious focal neurologic deficits, moving all extremities, and pleasantly confused on my examination.  Initial differential includes stroke/TIA, electrolyte abnormalities, infectious etiology including cystitis or pneumonia, viral infection, and polypharmacy.   Given these concerns, broad work-up initiated.  CBC with nonspecific leukocytosis 12.9 and stable hemoglobin.  CMP with no acute electrolyte abnormalities requiring urgent intervention.  TSH within normal limits.  Ammonia within normal limits.  Lactic acid normal.  Blood cultures pending x2.  CXR no active disease.  UA without signs of infection.  CT head with generalized cerebral atrophy and no acute abnormalities.  Previous work-up reviewed showed concern for ligamentous injury at C6/C7; was discussed by neurosurgery and will have outpatient follow-up.  Overall, this injury should not cause altered mental status or any other transient mentation issues given the location.  Given the altered mental status has occurred since the fall, I suspect the patient's symptomatology is likely due to postconcussive symptoms.  He should continue to be monitored over the next several days with concussion risk factors and avoid stimulating activity.  Gradually introduce activity as tolerated.  Follow-up with PCP and neurology if patient has continued symptoms.  No infectious etiology or other metabolic abnormalities to  explain today's symptoms. Continue with previously scheduled neurosurgery follow-up.    Diagnostics Vital Signs: reviewed Labs: reviewed and significant findings discussed above Imaging: personally reviewed images interpreted by radiology EKG: reviewed Records: nursing notes along with previous records reviewed and pertinent data discussed   Consults:  none   Reevaluation/Disposition:  Upon reevaluation, patients symptoms stable. No active nausea/vomiting prior to discharge from the emergency department.    All questions answered.  Strict return precautions were discussed. Additionally we discussed establishing and/or following-up with primary care physician.  Patient and/or family was understanding and in agreement with today's assessment and plan.   Sherolyn Buba, MD Emergency Medicine, PGY-3   Note: Dragon medical dictation software was used in the creation of this note.    Final Clinical Impression(s) / ED Diagnoses Final diagnoses:  None    Rx / DC Orders ED Discharge Orders    None       Frann Rider, MD 11/17/19 2344    Elnora Morrison, MD 11/18/19 0001

## 2019-11-17 NOTE — ED Notes (Signed)
Sean Lozano  [phone] Son is requesting an update

## 2019-11-17 NOTE — ED Triage Notes (Signed)
BIB [facility] after staff at [facility] reported [place]. Staff reports pt is normally A &O x4 but is now A & O x 2. Upon arrival pt is A & O x 2 (self, place). PT arrived in C-collar r/t to fall last week that caused a cervical fracture. Pt also has hx HTN , GERD and osteoarthritis. B/P 143/77, 02 sat 97 RA, HR 63

## 2019-11-18 NOTE — ED Notes (Signed)
Patient verbalizes understanding of discharge instructions. Opportunity for questioning and answers were provided. Armband removed by staff, pt discharged from ED via wheelchair and returned to lobby with son.

## 2019-11-19 DIAGNOSIS — M6281 Muscle weakness (generalized): Secondary | ICD-10-CM | POA: Diagnosis not present

## 2019-11-19 DIAGNOSIS — R2681 Unsteadiness on feet: Secondary | ICD-10-CM | POA: Diagnosis not present

## 2019-11-19 DIAGNOSIS — S12691A Other nondisplaced fracture of seventh cervical vertebra, initial encounter for closed fracture: Secondary | ICD-10-CM | POA: Diagnosis not present

## 2019-11-21 DIAGNOSIS — Z20828 Contact with and (suspected) exposure to other viral communicable diseases: Secondary | ICD-10-CM | POA: Diagnosis not present

## 2019-11-21 DIAGNOSIS — Z1159 Encounter for screening for other viral diseases: Secondary | ICD-10-CM | POA: Diagnosis not present

## 2019-11-22 DIAGNOSIS — S12691A Other nondisplaced fracture of seventh cervical vertebra, initial encounter for closed fracture: Secondary | ICD-10-CM | POA: Diagnosis not present

## 2019-11-22 DIAGNOSIS — R2681 Unsteadiness on feet: Secondary | ICD-10-CM | POA: Diagnosis not present

## 2019-11-22 DIAGNOSIS — M6281 Muscle weakness (generalized): Secondary | ICD-10-CM | POA: Diagnosis not present

## 2019-11-22 LAB — CULTURE, BLOOD (ROUTINE X 2)
Culture: NO GROWTH
Culture: NO GROWTH

## 2019-11-26 DIAGNOSIS — S12691A Other nondisplaced fracture of seventh cervical vertebra, initial encounter for closed fracture: Secondary | ICD-10-CM | POA: Diagnosis not present

## 2019-11-26 DIAGNOSIS — M6281 Muscle weakness (generalized): Secondary | ICD-10-CM | POA: Diagnosis not present

## 2019-11-26 DIAGNOSIS — R2681 Unsteadiness on feet: Secondary | ICD-10-CM | POA: Diagnosis not present

## 2019-11-28 DIAGNOSIS — Z20828 Contact with and (suspected) exposure to other viral communicable diseases: Secondary | ICD-10-CM | POA: Diagnosis not present

## 2019-11-28 DIAGNOSIS — Z1159 Encounter for screening for other viral diseases: Secondary | ICD-10-CM | POA: Diagnosis not present

## 2019-11-29 DIAGNOSIS — M6281 Muscle weakness (generalized): Secondary | ICD-10-CM | POA: Diagnosis not present

## 2019-11-29 DIAGNOSIS — S12691A Other nondisplaced fracture of seventh cervical vertebra, initial encounter for closed fracture: Secondary | ICD-10-CM | POA: Diagnosis not present

## 2019-11-29 DIAGNOSIS — R2681 Unsteadiness on feet: Secondary | ICD-10-CM | POA: Diagnosis not present

## 2019-11-30 DIAGNOSIS — M6281 Muscle weakness (generalized): Secondary | ICD-10-CM | POA: Diagnosis not present

## 2019-11-30 DIAGNOSIS — S12691A Other nondisplaced fracture of seventh cervical vertebra, initial encounter for closed fracture: Secondary | ICD-10-CM | POA: Diagnosis not present

## 2019-11-30 DIAGNOSIS — R2681 Unsteadiness on feet: Secondary | ICD-10-CM | POA: Diagnosis not present

## 2019-12-04 DIAGNOSIS — S12691A Other nondisplaced fracture of seventh cervical vertebra, initial encounter for closed fracture: Secondary | ICD-10-CM | POA: Diagnosis not present

## 2019-12-04 DIAGNOSIS — M6281 Muscle weakness (generalized): Secondary | ICD-10-CM | POA: Diagnosis not present

## 2019-12-04 DIAGNOSIS — R2681 Unsteadiness on feet: Secondary | ICD-10-CM | POA: Diagnosis not present

## 2019-12-05 DIAGNOSIS — Z20828 Contact with and (suspected) exposure to other viral communicable diseases: Secondary | ICD-10-CM | POA: Diagnosis not present

## 2019-12-05 DIAGNOSIS — Z1159 Encounter for screening for other viral diseases: Secondary | ICD-10-CM | POA: Diagnosis not present

## 2019-12-06 DIAGNOSIS — M6281 Muscle weakness (generalized): Secondary | ICD-10-CM | POA: Diagnosis not present

## 2019-12-06 DIAGNOSIS — R2681 Unsteadiness on feet: Secondary | ICD-10-CM | POA: Diagnosis not present

## 2019-12-06 DIAGNOSIS — S12691A Other nondisplaced fracture of seventh cervical vertebra, initial encounter for closed fracture: Secondary | ICD-10-CM | POA: Diagnosis not present

## 2019-12-07 DIAGNOSIS — S12691A Other nondisplaced fracture of seventh cervical vertebra, initial encounter for closed fracture: Secondary | ICD-10-CM | POA: Diagnosis not present

## 2019-12-07 DIAGNOSIS — M6281 Muscle weakness (generalized): Secondary | ICD-10-CM | POA: Diagnosis not present

## 2019-12-07 DIAGNOSIS — R2681 Unsteadiness on feet: Secondary | ICD-10-CM | POA: Diagnosis not present

## 2019-12-11 DIAGNOSIS — R2681 Unsteadiness on feet: Secondary | ICD-10-CM | POA: Diagnosis not present

## 2019-12-11 DIAGNOSIS — R296 Repeated falls: Secondary | ICD-10-CM | POA: Diagnosis not present

## 2019-12-11 DIAGNOSIS — M6281 Muscle weakness (generalized): Secondary | ICD-10-CM | POA: Diagnosis not present

## 2019-12-11 DIAGNOSIS — R488 Other symbolic dysfunctions: Secondary | ICD-10-CM | POA: Diagnosis not present

## 2019-12-11 DIAGNOSIS — S12691A Other nondisplaced fracture of seventh cervical vertebra, initial encounter for closed fracture: Secondary | ICD-10-CM | POA: Diagnosis not present

## 2019-12-12 DIAGNOSIS — S12691A Other nondisplaced fracture of seventh cervical vertebra, initial encounter for closed fracture: Secondary | ICD-10-CM | POA: Diagnosis not present

## 2019-12-12 DIAGNOSIS — Z1159 Encounter for screening for other viral diseases: Secondary | ICD-10-CM | POA: Diagnosis not present

## 2019-12-12 DIAGNOSIS — Z20828 Contact with and (suspected) exposure to other viral communicable diseases: Secondary | ICD-10-CM | POA: Diagnosis not present

## 2019-12-12 DIAGNOSIS — R2681 Unsteadiness on feet: Secondary | ICD-10-CM | POA: Diagnosis not present

## 2019-12-12 DIAGNOSIS — M6281 Muscle weakness (generalized): Secondary | ICD-10-CM | POA: Diagnosis not present

## 2019-12-14 DIAGNOSIS — M6281 Muscle weakness (generalized): Secondary | ICD-10-CM | POA: Diagnosis not present

## 2019-12-14 DIAGNOSIS — R488 Other symbolic dysfunctions: Secondary | ICD-10-CM | POA: Diagnosis not present

## 2019-12-14 DIAGNOSIS — R2681 Unsteadiness on feet: Secondary | ICD-10-CM | POA: Diagnosis not present

## 2019-12-14 DIAGNOSIS — S12691A Other nondisplaced fracture of seventh cervical vertebra, initial encounter for closed fracture: Secondary | ICD-10-CM | POA: Diagnosis not present

## 2019-12-14 DIAGNOSIS — R296 Repeated falls: Secondary | ICD-10-CM | POA: Diagnosis not present

## 2019-12-19 DIAGNOSIS — Z1159 Encounter for screening for other viral diseases: Secondary | ICD-10-CM | POA: Diagnosis not present

## 2019-12-19 DIAGNOSIS — Z20828 Contact with and (suspected) exposure to other viral communicable diseases: Secondary | ICD-10-CM | POA: Diagnosis not present

## 2019-12-19 DIAGNOSIS — S12691A Other nondisplaced fracture of seventh cervical vertebra, initial encounter for closed fracture: Secondary | ICD-10-CM | POA: Diagnosis not present

## 2019-12-19 DIAGNOSIS — M6281 Muscle weakness (generalized): Secondary | ICD-10-CM | POA: Diagnosis not present

## 2019-12-19 DIAGNOSIS — R2681 Unsteadiness on feet: Secondary | ICD-10-CM | POA: Diagnosis not present

## 2019-12-19 DIAGNOSIS — R296 Repeated falls: Secondary | ICD-10-CM | POA: Diagnosis not present

## 2019-12-19 DIAGNOSIS — R488 Other symbolic dysfunctions: Secondary | ICD-10-CM | POA: Diagnosis not present

## 2019-12-20 DIAGNOSIS — R296 Repeated falls: Secondary | ICD-10-CM | POA: Diagnosis not present

## 2019-12-20 DIAGNOSIS — R488 Other symbolic dysfunctions: Secondary | ICD-10-CM | POA: Diagnosis not present

## 2019-12-20 DIAGNOSIS — M6281 Muscle weakness (generalized): Secondary | ICD-10-CM | POA: Diagnosis not present

## 2019-12-21 DIAGNOSIS — R2681 Unsteadiness on feet: Secondary | ICD-10-CM | POA: Diagnosis not present

## 2019-12-21 DIAGNOSIS — S12691A Other nondisplaced fracture of seventh cervical vertebra, initial encounter for closed fracture: Secondary | ICD-10-CM | POA: Diagnosis not present

## 2019-12-21 DIAGNOSIS — M6281 Muscle weakness (generalized): Secondary | ICD-10-CM | POA: Diagnosis not present

## 2019-12-24 DIAGNOSIS — M6281 Muscle weakness (generalized): Secondary | ICD-10-CM | POA: Diagnosis not present

## 2019-12-24 DIAGNOSIS — R488 Other symbolic dysfunctions: Secondary | ICD-10-CM | POA: Diagnosis not present

## 2019-12-24 DIAGNOSIS — R296 Repeated falls: Secondary | ICD-10-CM | POA: Diagnosis not present

## 2019-12-25 DIAGNOSIS — R488 Other symbolic dysfunctions: Secondary | ICD-10-CM | POA: Diagnosis not present

## 2019-12-25 DIAGNOSIS — R296 Repeated falls: Secondary | ICD-10-CM | POA: Diagnosis not present

## 2019-12-25 DIAGNOSIS — M6281 Muscle weakness (generalized): Secondary | ICD-10-CM | POA: Diagnosis not present

## 2019-12-26 DIAGNOSIS — Z1159 Encounter for screening for other viral diseases: Secondary | ICD-10-CM | POA: Diagnosis not present

## 2019-12-26 DIAGNOSIS — S12691A Other nondisplaced fracture of seventh cervical vertebra, initial encounter for closed fracture: Secondary | ICD-10-CM | POA: Diagnosis not present

## 2019-12-26 DIAGNOSIS — Z20828 Contact with and (suspected) exposure to other viral communicable diseases: Secondary | ICD-10-CM | POA: Diagnosis not present

## 2019-12-26 DIAGNOSIS — M6281 Muscle weakness (generalized): Secondary | ICD-10-CM | POA: Diagnosis not present

## 2019-12-26 DIAGNOSIS — R2681 Unsteadiness on feet: Secondary | ICD-10-CM | POA: Diagnosis not present

## 2019-12-27 ENCOUNTER — Ambulatory Visit: Payer: Medicare PPO | Admitting: Internal Medicine

## 2019-12-27 DIAGNOSIS — R488 Other symbolic dysfunctions: Secondary | ICD-10-CM | POA: Diagnosis not present

## 2019-12-27 DIAGNOSIS — M6281 Muscle weakness (generalized): Secondary | ICD-10-CM | POA: Diagnosis not present

## 2019-12-27 DIAGNOSIS — R296 Repeated falls: Secondary | ICD-10-CM | POA: Diagnosis not present

## 2019-12-28 DIAGNOSIS — R2681 Unsteadiness on feet: Secondary | ICD-10-CM | POA: Diagnosis not present

## 2019-12-28 DIAGNOSIS — M6281 Muscle weakness (generalized): Secondary | ICD-10-CM | POA: Diagnosis not present

## 2019-12-28 DIAGNOSIS — S12691A Other nondisplaced fracture of seventh cervical vertebra, initial encounter for closed fracture: Secondary | ICD-10-CM | POA: Diagnosis not present

## 2019-12-31 DIAGNOSIS — R488 Other symbolic dysfunctions: Secondary | ICD-10-CM | POA: Diagnosis not present

## 2019-12-31 DIAGNOSIS — R296 Repeated falls: Secondary | ICD-10-CM | POA: Diagnosis not present

## 2019-12-31 DIAGNOSIS — R2681 Unsteadiness on feet: Secondary | ICD-10-CM | POA: Diagnosis not present

## 2019-12-31 DIAGNOSIS — M6281 Muscle weakness (generalized): Secondary | ICD-10-CM | POA: Diagnosis not present

## 2019-12-31 DIAGNOSIS — S12691A Other nondisplaced fracture of seventh cervical vertebra, initial encounter for closed fracture: Secondary | ICD-10-CM | POA: Diagnosis not present

## 2020-01-01 DIAGNOSIS — R296 Repeated falls: Secondary | ICD-10-CM | POA: Diagnosis not present

## 2020-01-01 DIAGNOSIS — Z1159 Encounter for screening for other viral diseases: Secondary | ICD-10-CM | POA: Diagnosis not present

## 2020-01-01 DIAGNOSIS — Z20828 Contact with and (suspected) exposure to other viral communicable diseases: Secondary | ICD-10-CM | POA: Diagnosis not present

## 2020-01-01 DIAGNOSIS — S12691A Other nondisplaced fracture of seventh cervical vertebra, initial encounter for closed fracture: Secondary | ICD-10-CM | POA: Insufficient documentation

## 2020-01-01 DIAGNOSIS — M6281 Muscle weakness (generalized): Secondary | ICD-10-CM | POA: Diagnosis not present

## 2020-01-01 DIAGNOSIS — R488 Other symbolic dysfunctions: Secondary | ICD-10-CM | POA: Diagnosis not present

## 2020-01-02 DIAGNOSIS — S12691A Other nondisplaced fracture of seventh cervical vertebra, initial encounter for closed fracture: Secondary | ICD-10-CM | POA: Diagnosis not present

## 2020-01-02 DIAGNOSIS — M6281 Muscle weakness (generalized): Secondary | ICD-10-CM | POA: Diagnosis not present

## 2020-01-02 DIAGNOSIS — R2681 Unsteadiness on feet: Secondary | ICD-10-CM | POA: Diagnosis not present

## 2020-01-03 DIAGNOSIS — M6281 Muscle weakness (generalized): Secondary | ICD-10-CM | POA: Diagnosis not present

## 2020-01-03 DIAGNOSIS — R2681 Unsteadiness on feet: Secondary | ICD-10-CM | POA: Diagnosis not present

## 2020-01-03 DIAGNOSIS — R488 Other symbolic dysfunctions: Secondary | ICD-10-CM | POA: Diagnosis not present

## 2020-01-03 DIAGNOSIS — R296 Repeated falls: Secondary | ICD-10-CM | POA: Diagnosis not present

## 2020-01-03 DIAGNOSIS — S12691A Other nondisplaced fracture of seventh cervical vertebra, initial encounter for closed fracture: Secondary | ICD-10-CM | POA: Diagnosis not present

## 2020-01-04 DIAGNOSIS — M6281 Muscle weakness (generalized): Secondary | ICD-10-CM | POA: Diagnosis not present

## 2020-01-04 DIAGNOSIS — R488 Other symbolic dysfunctions: Secondary | ICD-10-CM | POA: Diagnosis not present

## 2020-01-04 DIAGNOSIS — Z20828 Contact with and (suspected) exposure to other viral communicable diseases: Secondary | ICD-10-CM | POA: Diagnosis not present

## 2020-01-04 DIAGNOSIS — Z1159 Encounter for screening for other viral diseases: Secondary | ICD-10-CM | POA: Diagnosis not present

## 2020-01-04 DIAGNOSIS — R296 Repeated falls: Secondary | ICD-10-CM | POA: Diagnosis not present

## 2020-01-07 DIAGNOSIS — R296 Repeated falls: Secondary | ICD-10-CM | POA: Diagnosis not present

## 2020-01-07 DIAGNOSIS — R488 Other symbolic dysfunctions: Secondary | ICD-10-CM | POA: Diagnosis not present

## 2020-01-07 DIAGNOSIS — R41841 Cognitive communication deficit: Secondary | ICD-10-CM | POA: Diagnosis not present

## 2020-01-07 DIAGNOSIS — S12691A Other nondisplaced fracture of seventh cervical vertebra, initial encounter for closed fracture: Secondary | ICD-10-CM | POA: Diagnosis not present

## 2020-01-07 DIAGNOSIS — M6281 Muscle weakness (generalized): Secondary | ICD-10-CM | POA: Diagnosis not present

## 2020-01-07 DIAGNOSIS — R2681 Unsteadiness on feet: Secondary | ICD-10-CM | POA: Diagnosis not present

## 2020-01-08 DIAGNOSIS — R488 Other symbolic dysfunctions: Secondary | ICD-10-CM | POA: Diagnosis not present

## 2020-01-08 DIAGNOSIS — R296 Repeated falls: Secondary | ICD-10-CM | POA: Diagnosis not present

## 2020-01-08 DIAGNOSIS — M6281 Muscle weakness (generalized): Secondary | ICD-10-CM | POA: Diagnosis not present

## 2020-01-09 DIAGNOSIS — R2681 Unsteadiness on feet: Secondary | ICD-10-CM | POA: Diagnosis not present

## 2020-01-09 DIAGNOSIS — M6281 Muscle weakness (generalized): Secondary | ICD-10-CM | POA: Diagnosis not present

## 2020-01-09 DIAGNOSIS — S12691A Other nondisplaced fracture of seventh cervical vertebra, initial encounter for closed fracture: Secondary | ICD-10-CM | POA: Diagnosis not present

## 2020-01-11 DIAGNOSIS — M6281 Muscle weakness (generalized): Secondary | ICD-10-CM | POA: Diagnosis not present

## 2020-01-11 DIAGNOSIS — Z1159 Encounter for screening for other viral diseases: Secondary | ICD-10-CM | POA: Diagnosis not present

## 2020-01-11 DIAGNOSIS — R41841 Cognitive communication deficit: Secondary | ICD-10-CM | POA: Diagnosis not present

## 2020-01-11 DIAGNOSIS — R488 Other symbolic dysfunctions: Secondary | ICD-10-CM | POA: Diagnosis not present

## 2020-01-11 DIAGNOSIS — R296 Repeated falls: Secondary | ICD-10-CM | POA: Diagnosis not present

## 2020-01-11 DIAGNOSIS — S12691A Other nondisplaced fracture of seventh cervical vertebra, initial encounter for closed fracture: Secondary | ICD-10-CM | POA: Diagnosis not present

## 2020-01-11 DIAGNOSIS — Z20828 Contact with and (suspected) exposure to other viral communicable diseases: Secondary | ICD-10-CM | POA: Diagnosis not present

## 2020-01-11 DIAGNOSIS — R2681 Unsteadiness on feet: Secondary | ICD-10-CM | POA: Diagnosis not present

## 2020-01-14 DIAGNOSIS — R2681 Unsteadiness on feet: Secondary | ICD-10-CM | POA: Diagnosis not present

## 2020-01-14 DIAGNOSIS — M6281 Muscle weakness (generalized): Secondary | ICD-10-CM | POA: Diagnosis not present

## 2020-01-14 DIAGNOSIS — R41841 Cognitive communication deficit: Secondary | ICD-10-CM | POA: Diagnosis not present

## 2020-01-14 DIAGNOSIS — R488 Other symbolic dysfunctions: Secondary | ICD-10-CM | POA: Diagnosis not present

## 2020-01-14 DIAGNOSIS — S12691A Other nondisplaced fracture of seventh cervical vertebra, initial encounter for closed fracture: Secondary | ICD-10-CM | POA: Diagnosis not present

## 2020-01-14 DIAGNOSIS — R296 Repeated falls: Secondary | ICD-10-CM | POA: Diagnosis not present

## 2020-01-15 DIAGNOSIS — Z1159 Encounter for screening for other viral diseases: Secondary | ICD-10-CM | POA: Diagnosis not present

## 2020-01-15 DIAGNOSIS — Z20828 Contact with and (suspected) exposure to other viral communicable diseases: Secondary | ICD-10-CM | POA: Diagnosis not present

## 2020-01-15 DIAGNOSIS — R488 Other symbolic dysfunctions: Secondary | ICD-10-CM | POA: Diagnosis not present

## 2020-01-15 DIAGNOSIS — M6281 Muscle weakness (generalized): Secondary | ICD-10-CM | POA: Diagnosis not present

## 2020-01-15 DIAGNOSIS — R2681 Unsteadiness on feet: Secondary | ICD-10-CM | POA: Diagnosis not present

## 2020-01-15 DIAGNOSIS — R296 Repeated falls: Secondary | ICD-10-CM | POA: Diagnosis not present

## 2020-01-15 DIAGNOSIS — S12691A Other nondisplaced fracture of seventh cervical vertebra, initial encounter for closed fracture: Secondary | ICD-10-CM | POA: Diagnosis not present

## 2020-01-16 DIAGNOSIS — R2681 Unsteadiness on feet: Secondary | ICD-10-CM | POA: Diagnosis not present

## 2020-01-16 DIAGNOSIS — S12691A Other nondisplaced fracture of seventh cervical vertebra, initial encounter for closed fracture: Secondary | ICD-10-CM | POA: Diagnosis not present

## 2020-01-16 DIAGNOSIS — M6281 Muscle weakness (generalized): Secondary | ICD-10-CM | POA: Diagnosis not present

## 2020-01-17 DIAGNOSIS — R296 Repeated falls: Secondary | ICD-10-CM | POA: Diagnosis not present

## 2020-01-17 DIAGNOSIS — R488 Other symbolic dysfunctions: Secondary | ICD-10-CM | POA: Diagnosis not present

## 2020-01-17 DIAGNOSIS — M6281 Muscle weakness (generalized): Secondary | ICD-10-CM | POA: Diagnosis not present

## 2020-01-18 DIAGNOSIS — Z1159 Encounter for screening for other viral diseases: Secondary | ICD-10-CM | POA: Diagnosis not present

## 2020-01-18 DIAGNOSIS — R41841 Cognitive communication deficit: Secondary | ICD-10-CM | POA: Diagnosis not present

## 2020-01-18 DIAGNOSIS — Z20828 Contact with and (suspected) exposure to other viral communicable diseases: Secondary | ICD-10-CM | POA: Diagnosis not present

## 2020-01-18 DIAGNOSIS — R488 Other symbolic dysfunctions: Secondary | ICD-10-CM | POA: Diagnosis not present

## 2020-01-21 DIAGNOSIS — R296 Repeated falls: Secondary | ICD-10-CM | POA: Diagnosis not present

## 2020-01-21 DIAGNOSIS — S12691A Other nondisplaced fracture of seventh cervical vertebra, initial encounter for closed fracture: Secondary | ICD-10-CM | POA: Diagnosis not present

## 2020-01-21 DIAGNOSIS — M6281 Muscle weakness (generalized): Secondary | ICD-10-CM | POA: Diagnosis not present

## 2020-01-21 DIAGNOSIS — R488 Other symbolic dysfunctions: Secondary | ICD-10-CM | POA: Diagnosis not present

## 2020-01-21 DIAGNOSIS — R2681 Unsteadiness on feet: Secondary | ICD-10-CM | POA: Diagnosis not present

## 2020-01-22 DIAGNOSIS — R2681 Unsteadiness on feet: Secondary | ICD-10-CM | POA: Diagnosis not present

## 2020-01-22 DIAGNOSIS — M6281 Muscle weakness (generalized): Secondary | ICD-10-CM | POA: Diagnosis not present

## 2020-01-22 DIAGNOSIS — R296 Repeated falls: Secondary | ICD-10-CM | POA: Diagnosis not present

## 2020-01-22 DIAGNOSIS — R488 Other symbolic dysfunctions: Secondary | ICD-10-CM | POA: Diagnosis not present

## 2020-01-22 DIAGNOSIS — S12691A Other nondisplaced fracture of seventh cervical vertebra, initial encounter for closed fracture: Secondary | ICD-10-CM | POA: Diagnosis not present

## 2020-01-24 DIAGNOSIS — M6281 Muscle weakness (generalized): Secondary | ICD-10-CM | POA: Diagnosis not present

## 2020-01-24 DIAGNOSIS — R488 Other symbolic dysfunctions: Secondary | ICD-10-CM | POA: Diagnosis not present

## 2020-01-24 DIAGNOSIS — S12691A Other nondisplaced fracture of seventh cervical vertebra, initial encounter for closed fracture: Secondary | ICD-10-CM | POA: Diagnosis not present

## 2020-01-24 DIAGNOSIS — R296 Repeated falls: Secondary | ICD-10-CM | POA: Diagnosis not present

## 2020-01-24 DIAGNOSIS — R2681 Unsteadiness on feet: Secondary | ICD-10-CM | POA: Diagnosis not present

## 2020-01-25 DIAGNOSIS — Z20828 Contact with and (suspected) exposure to other viral communicable diseases: Secondary | ICD-10-CM | POA: Diagnosis not present

## 2020-01-25 DIAGNOSIS — Z1159 Encounter for screening for other viral diseases: Secondary | ICD-10-CM | POA: Diagnosis not present

## 2020-01-29 ENCOUNTER — Other Ambulatory Visit: Payer: Self-pay

## 2020-01-29 DIAGNOSIS — R296 Repeated falls: Secondary | ICD-10-CM | POA: Diagnosis not present

## 2020-01-29 DIAGNOSIS — R41841 Cognitive communication deficit: Secondary | ICD-10-CM | POA: Diagnosis not present

## 2020-01-29 DIAGNOSIS — Z1159 Encounter for screening for other viral diseases: Secondary | ICD-10-CM | POA: Diagnosis not present

## 2020-01-29 DIAGNOSIS — R488 Other symbolic dysfunctions: Secondary | ICD-10-CM | POA: Diagnosis not present

## 2020-01-29 DIAGNOSIS — Z20828 Contact with and (suspected) exposure to other viral communicable diseases: Secondary | ICD-10-CM | POA: Diagnosis not present

## 2020-01-29 DIAGNOSIS — M6281 Muscle weakness (generalized): Secondary | ICD-10-CM | POA: Diagnosis not present

## 2020-01-30 ENCOUNTER — Ambulatory Visit: Payer: Medicare PPO | Admitting: Internal Medicine

## 2020-01-30 ENCOUNTER — Encounter: Payer: Self-pay | Admitting: Internal Medicine

## 2020-01-30 VITALS — BP 110/68 | HR 62 | Temp 97.6°F | Ht 71.0 in | Wt 176.0 lb

## 2020-01-30 DIAGNOSIS — I10 Essential (primary) hypertension: Secondary | ICD-10-CM | POA: Diagnosis not present

## 2020-01-30 DIAGNOSIS — E1165 Type 2 diabetes mellitus with hyperglycemia: Secondary | ICD-10-CM | POA: Diagnosis not present

## 2020-01-30 DIAGNOSIS — E785 Hyperlipidemia, unspecified: Secondary | ICD-10-CM

## 2020-01-30 NOTE — Progress Notes (Signed)
Subjective:    Patient ID: Sean Lozano, male    DOB: Jan 31, 1934, 86 y.o.   MRN: [id number]  HPI  Here with son, (wife died just a month ago), lives at [facility], [place] doing ok,  Pt and son for him denies chest pain, increasing sob or doe, wheezing, orthopnea, PND, increased LE swelling, palpitations, dizziness or syncope.  Pt denies new neurological symptoms such as new headache, or facial or extremity weakness or numbness.  Pt denies polydipsia, polyuria, or low sugar episode.  Pt states overall good compliance with meds, mostly trying to follow appropriate diet, with wt overall down several lbs recently with less appetite.  Wt Readings from Last 3 Encounters:  01/30/20 176 lb (79.8 kg)  11/15/19 180 lb (81.6 kg)  11/09/19 185 lb 6.4 oz (84.1 kg)   Past Medical History:  Diagnosis Date  . 1st degree AV block   . Arthritis   . Asthma   . BARRETTS ESOPHAGUS 06/02/2007  . Benign prostatic hypertrophy   . Bladder neck obstruction 09/29/2010  . CAD (coronary artery disease)    a. CABG x 6 in 1992. b. s/p DES x 2 to SVG -> OM1/OM2 in 09/2010. c. s/p DES to mLAD 10/2010. d. 12/2011 NSTEMI (off DAPT) s/p PTCA of ISR of DES in SVG-OM, Recs for lifelong DAPT   . Carotid artery disease ([place])    Doppler, May, 2014, 8-52% R. ICA, 77-82% LICA, with recent syncope patient sent for consult with vascular surgery  . Carotid bruit    April, 2014  . Cervical disc disease   . Concussion July 09, 2012   Scalp  . Depression   . Diabetes mellitus    type II-diet  . Diverticulosis   . Echocardiogram    Echo 09/2018: EF 60-65, Gr 1 DD, mild AoV sclerosis, trivial TR  . Ejection fraction    EF 60%, echo, June, 2012  . Fall at home July 09, 2012  . GERD (gastroesophageal reflux disease)   . Hearing loss 2014  . Hx of CABG    1992  . Hyperlipidemia    intolerance to Crestor Zocor Vytorin. He tolerates Pravachol...low HDL  . Paresthesias    successful surgery by Dr. Earnie Larsson  .  Prostatitis   . Rosacea   . Sinus bradycardia    asymptomatic   . SKIN LESION 06/02/2007  . Squamous cell carcinoma of skin 08/22/2019   moderately differentiated on right buccal cheek(excision) margins free  . Subdural hematoma (HCC)    Subdural hematoma secondary to falling off a ladder  April, 2014,  medical therapy,  subdural did not expand while blood levels of ddual antiplatelet therapy decreased over one week.  Episode of chest pain in the hospital,, dual antiplatelet therapy restarted approximately one week after it was held  . Syncope    The patient lost consciousness and hit his head July 09 2012, not clear yet if he had true syncope   Past Surgical History:  Procedure Laterality Date  . APPENDECTOMY    . Barretts Esophagus   20/20/09  . Bladder neck obstruction  09/29/10  . C-spine disc surgery  03/2006  . C4-5 surgery  01/2009  . CARDIAC CATHETERIZATION  July 2012   stent placed  X's  4  . COCCYX FRACTURE SURGERY  1997  . CORONARY ARTERY BYPASS GRAFT  1992  . ENDARTERECTOMY Left 09/07/2012   Procedure: ENDARTERECTOMY CAROTID with patch angioplasty;  Surgeon: Mal Misty, MD;  Location:  [facility] OR;  Service: Vascular;  Laterality: Left;  . LEFT HEART CATHETERIZATION WITH CORONARY/GRAFT ANGIOGRAM N/A 12/21/2011   Procedure: LEFT HEART CATHETERIZATION WITH Beatrix Fetters;  Surgeon: Hillary Bow, MD;  Location: [facility] CATH LAB;  Service: Cardiovascular;  Laterality: N/A;  . TONSILLECTOMY      reports that he has quit smoking. His smoking use included cigarettes. He started smoking about 65 years ago. He has never used smokeless tobacco. He reports previous alcohol use. He reports that he does not use drugs. family history includes Aneurysm in an other family member; Diabetes in his mother, sister, and son; Heart attack in his father; Heart disease in his father and mother; Hyperlipidemia in his son and another family member; Hypertension in his son and son; Kidney disease  in his sister; Liver cancer in an other family member. Allergies  Allergen Reactions  . Atenolol Other (See Comments)    depression  . Rosuvastatin Other (See Comments)    Severe hip pain  . Simvastatin Other (See Comments)    Severe hip pain  . Ultram [Tramadol] Nausea And Vomiting  . Zofran [Ondansetron Hcl] Nausea Only and Other (See Comments)    Severe nausea and constipation  . Adhesive [Tape] Other (See Comments)    Breakdown skin  . Codeine Other (See Comments)    Crazy feeling  . Ezetimibe-Simvastatin Other (See Comments)    Severe hip pain   Current Outpatient Medications on File Prior to Visit  Medication Sig Dispense Refill  . acetaminophen (TYLENOL) 325 MG tablet Take 2 tablets (650 mg total) by mouth every 6 (six) hours as needed for up to 30 doses for mild pain or moderate pain. 30 tablet 1  . acetaminophen (TYLENOL) 500 MG tablet Take 500 mg by mouth every 6 (six) hours as needed (pain or temp greater than 100.5).    Marland Kitchen amLODipine (NORVASC) 5 MG tablet TAKE 1 TABLET BY MOUTH EVERY DAY (Patient taking differently: Take 5 mg by mouth daily. ) 90 tablet 2  . aspirin 81 MG chewable tablet Chew 81 mg by mouth at bedtime.    . Calcium Carbonate-Vitamin D3 (CALCIUM 600-D) 600-400 MG-UNIT TABS Take 1 tablet by mouth daily.    . clopidogrel (PLAVIX) 75 MG tablet TAKE 1 TABLET BY MOUTH EVERY DAY (Patient taking differently: Take 75 mg by mouth at bedtime. ) 90 tablet 3  . Menthol, Topical Analgesic, (BIOFREEZE) 4 % GEL Apply 1 application topically 4 (four) times daily as needed (knee pain).    . Multiple Vitamins-Minerals (CERTAVITE SENIOR) TABS Take 1 tablet by mouth daily.    . nitroGLYCERIN (NITROSTAT) 0.4 MG SL tablet Place 1 tablet (0.4 mg total) under the tongue every 5 (five) minutes as needed for chest pain. (Patient taking differently: Place 0.4 mg under the tongue every 5 (five) minutes as needed for chest pain (if still in pain after 3 doses call 911). ) 75 tablet 2  .  polyethylene glycol (MIRALAX / GLYCOLAX) 17 g packet Take 17 g by mouth daily as needed (constipation).    . pravastatin (PRAVACHOL) 40 MG tablet TAKE 1 TABLET BY MOUTH EVERY DAY IN THE EVENING (Patient taking differently: Take 40 mg by mouth at bedtime. ) 90 tablet 2  . sertraline (ZOLOFT) 25 MG tablet     . tamsulosin (FLOMAX) 0.4 MG CAPS capsule TAKE 1 CAPSULE (0.4 MG TOTAL) BY MOUTH DAILY AFTER SUPPER. (Patient taking differently: Take 0.4 mg by mouth at bedtime. ) 90 capsule 1  No current facility-administered medications on file prior to visit.   Review of Systems All otherwise neg per pt and son    Objective:   Physical Exam BP 110/68 (BP Location: Left Arm, Patient Position: Sitting, Cuff Size: Large)   Pulse 62   Temp 97.6 F (36.4 C) (Oral)   Ht 5\' 11"  (1.803 m)   Wt 176 lb (79.8 kg)   SpO2 95%   BMI 24.55 kg/m  VS noted,  Constitutional: Pt appears in NAD HENT: Head: NCAT.  Right Ear: External ear normal.  Left Ear: External ear normal.  Eyes: . Pupils are equal, round, and reactive to light. Conjunctivae and EOM are normal Nose: without d/c or deformity Neck: Neck supple. Gross normal ROM Cardiovascular: Normal rate and regular rhythm.   Pulmonary/Chest: Effort normal and breath sounds without rales or wheezing.  Abd:  Soft, NT, ND, + BS, no organomegaly Neurological: Pt is alert. At baseline orientation, motor grossly intact Skin: Skin is warm. No rashes, other new lesions, no LE edema Psychiatric: Pt behavior is normal without agitation  All otherwise neg per pt Lab Results  Component Value Date   WBC 12.9 (H) 11/17/2019   HGB 15.4 11/17/2019   HCT 45.4 11/17/2019   PLT 154 11/17/2019   GLUCOSE 116 (H) 11/17/2019   CHOL 144 12/25/2018   TRIG 152.0 (H) 12/25/2018   HDL 36.40 (L) 12/25/2018   LDLDIRECT 87.6 09/19/2008   LDLCALC 77 12/25/2018   ALT 20 11/17/2019   AST 21 11/17/2019   NA 136 11/17/2019   K 3.7 11/17/2019   CL 104 11/17/2019   CREATININE  1.24 11/17/2019   BUN 22 11/17/2019   CO2 21 (L) 11/17/2019   TSH 3.268 11/17/2019   PSA 1.47 11/20/2013   INR 1.04 12/02/2017   HGBA1C 6.7 (A) 06/25/2019   MICROALBUR 1.8 12/25/2018      Assessment & Plan:

## 2020-01-30 NOTE — Patient Instructions (Signed)
Your A1c was Ok today  Please continue all other medications as before, and refills have been done if requested.  Please have the pharmacy call with any other refills you may need.  Please continue your efforts at being more active, low cholesterol diet, and weight control  Please keep your appointments with your specialists as you may have planned  Please make an Appointment to return in 6 months, or sooner if needed

## 2020-01-31 DIAGNOSIS — R488 Other symbolic dysfunctions: Secondary | ICD-10-CM | POA: Diagnosis not present

## 2020-01-31 DIAGNOSIS — M6281 Muscle weakness (generalized): Secondary | ICD-10-CM | POA: Diagnosis not present

## 2020-01-31 DIAGNOSIS — R41841 Cognitive communication deficit: Secondary | ICD-10-CM | POA: Diagnosis not present

## 2020-01-31 DIAGNOSIS — R2681 Unsteadiness on feet: Secondary | ICD-10-CM | POA: Diagnosis not present

## 2020-01-31 DIAGNOSIS — S12691A Other nondisplaced fracture of seventh cervical vertebra, initial encounter for closed fracture: Secondary | ICD-10-CM | POA: Diagnosis not present

## 2020-02-01 DIAGNOSIS — R488 Other symbolic dysfunctions: Secondary | ICD-10-CM | POA: Diagnosis not present

## 2020-02-01 DIAGNOSIS — Z1159 Encounter for screening for other viral diseases: Secondary | ICD-10-CM | POA: Diagnosis not present

## 2020-02-01 DIAGNOSIS — Z20828 Contact with and (suspected) exposure to other viral communicable diseases: Secondary | ICD-10-CM | POA: Diagnosis not present

## 2020-02-01 DIAGNOSIS — M6281 Muscle weakness (generalized): Secondary | ICD-10-CM | POA: Diagnosis not present

## 2020-02-01 DIAGNOSIS — S12691A Other nondisplaced fracture of seventh cervical vertebra, initial encounter for closed fracture: Secondary | ICD-10-CM | POA: Diagnosis not present

## 2020-02-01 DIAGNOSIS — R2681 Unsteadiness on feet: Secondary | ICD-10-CM | POA: Diagnosis not present

## 2020-02-01 DIAGNOSIS — R41841 Cognitive communication deficit: Secondary | ICD-10-CM | POA: Diagnosis not present

## 2020-02-04 ENCOUNTER — Encounter: Payer: Self-pay | Admitting: Internal Medicine

## 2020-02-04 DIAGNOSIS — M6281 Muscle weakness (generalized): Secondary | ICD-10-CM | POA: Diagnosis not present

## 2020-02-04 DIAGNOSIS — R2681 Unsteadiness on feet: Secondary | ICD-10-CM | POA: Diagnosis not present

## 2020-02-04 DIAGNOSIS — S12691A Other nondisplaced fracture of seventh cervical vertebra, initial encounter for closed fracture: Secondary | ICD-10-CM | POA: Diagnosis not present

## 2020-02-04 NOTE — Assessment & Plan Note (Addendum)
stable overall by history and exam, recent data reviewed with pt, and pt to continue medical treatment as before,  to f/u any worsening symptoms or concerns  I spent 31 minutes in preparing to see the patient by review of recent labs, imaging and procedures, obtaining and reviewing separately obtained history, communicating with the patient and family or caregiver, ordering medications, tests or procedures, and documenting clinical information in the EHR including the differential Dx, treatment, and any further evaluation and other management of dm, htn, hld  

## 2020-02-04 NOTE — Assessment & Plan Note (Signed)
stable overall by history and exam, recent data reviewed with pt, and pt to continue medical treatment as before,  to f/u any worsening symptoms or concerns  

## 2020-02-05 DIAGNOSIS — Z1159 Encounter for screening for other viral diseases: Secondary | ICD-10-CM | POA: Diagnosis not present

## 2020-02-05 DIAGNOSIS — R488 Other symbolic dysfunctions: Secondary | ICD-10-CM | POA: Diagnosis not present

## 2020-02-05 DIAGNOSIS — R296 Repeated falls: Secondary | ICD-10-CM | POA: Diagnosis not present

## 2020-02-05 DIAGNOSIS — M6281 Muscle weakness (generalized): Secondary | ICD-10-CM | POA: Diagnosis not present

## 2020-02-05 DIAGNOSIS — Z20828 Contact with and (suspected) exposure to other viral communicable diseases: Secondary | ICD-10-CM | POA: Diagnosis not present

## 2020-02-05 DIAGNOSIS — R41841 Cognitive communication deficit: Secondary | ICD-10-CM | POA: Diagnosis not present

## 2020-02-06 DIAGNOSIS — M6281 Muscle weakness (generalized): Secondary | ICD-10-CM | POA: Diagnosis not present

## 2020-02-06 DIAGNOSIS — R2681 Unsteadiness on feet: Secondary | ICD-10-CM | POA: Diagnosis not present

## 2020-02-06 DIAGNOSIS — R488 Other symbolic dysfunctions: Secondary | ICD-10-CM | POA: Diagnosis not present

## 2020-02-06 DIAGNOSIS — R296 Repeated falls: Secondary | ICD-10-CM | POA: Diagnosis not present

## 2020-02-06 DIAGNOSIS — S12691A Other nondisplaced fracture of seventh cervical vertebra, initial encounter for closed fracture: Secondary | ICD-10-CM | POA: Diagnosis not present

## 2020-02-07 DIAGNOSIS — R488 Other symbolic dysfunctions: Secondary | ICD-10-CM | POA: Diagnosis not present

## 2020-02-07 DIAGNOSIS — M6281 Muscle weakness (generalized): Secondary | ICD-10-CM | POA: Diagnosis not present

## 2020-02-07 DIAGNOSIS — R296 Repeated falls: Secondary | ICD-10-CM | POA: Diagnosis not present

## 2020-02-08 ENCOUNTER — Ambulatory Visit: Payer: Medicare PPO | Admitting: Neurology

## 2020-02-08 DIAGNOSIS — R41841 Cognitive communication deficit: Secondary | ICD-10-CM | POA: Diagnosis not present

## 2020-02-08 DIAGNOSIS — S12691A Other nondisplaced fracture of seventh cervical vertebra, initial encounter for closed fracture: Secondary | ICD-10-CM | POA: Diagnosis not present

## 2020-02-08 DIAGNOSIS — R296 Repeated falls: Secondary | ICD-10-CM | POA: Diagnosis not present

## 2020-02-08 DIAGNOSIS — R488 Other symbolic dysfunctions: Secondary | ICD-10-CM | POA: Diagnosis not present

## 2020-02-08 DIAGNOSIS — M6281 Muscle weakness (generalized): Secondary | ICD-10-CM | POA: Diagnosis not present

## 2020-02-08 DIAGNOSIS — R2681 Unsteadiness on feet: Secondary | ICD-10-CM | POA: Diagnosis not present

## 2020-02-08 DIAGNOSIS — Z20828 Contact with and (suspected) exposure to other viral communicable diseases: Secondary | ICD-10-CM | POA: Diagnosis not present

## 2020-02-08 DIAGNOSIS — Z1159 Encounter for screening for other viral diseases: Secondary | ICD-10-CM | POA: Diagnosis not present

## 2020-02-11 DIAGNOSIS — R2681 Unsteadiness on feet: Secondary | ICD-10-CM | POA: Diagnosis not present

## 2020-02-11 DIAGNOSIS — S12691A Other nondisplaced fracture of seventh cervical vertebra, initial encounter for closed fracture: Secondary | ICD-10-CM | POA: Diagnosis not present

## 2020-02-11 DIAGNOSIS — M6281 Muscle weakness (generalized): Secondary | ICD-10-CM | POA: Diagnosis not present

## 2020-02-12 DIAGNOSIS — M6281 Muscle weakness (generalized): Secondary | ICD-10-CM | POA: Diagnosis not present

## 2020-02-12 DIAGNOSIS — R488 Other symbolic dysfunctions: Secondary | ICD-10-CM | POA: Diagnosis not present

## 2020-02-12 DIAGNOSIS — Z20828 Contact with and (suspected) exposure to other viral communicable diseases: Secondary | ICD-10-CM | POA: Diagnosis not present

## 2020-02-12 DIAGNOSIS — S12691A Other nondisplaced fracture of seventh cervical vertebra, initial encounter for closed fracture: Secondary | ICD-10-CM | POA: Diagnosis not present

## 2020-02-12 DIAGNOSIS — R2681 Unsteadiness on feet: Secondary | ICD-10-CM | POA: Diagnosis not present

## 2020-02-12 DIAGNOSIS — R41841 Cognitive communication deficit: Secondary | ICD-10-CM | POA: Diagnosis not present

## 2020-02-12 DIAGNOSIS — Z1159 Encounter for screening for other viral diseases: Secondary | ICD-10-CM | POA: Diagnosis not present

## 2020-02-12 DIAGNOSIS — R296 Repeated falls: Secondary | ICD-10-CM | POA: Diagnosis not present

## 2020-02-14 DIAGNOSIS — M6281 Muscle weakness (generalized): Secondary | ICD-10-CM | POA: Diagnosis not present

## 2020-02-14 DIAGNOSIS — R41841 Cognitive communication deficit: Secondary | ICD-10-CM | POA: Diagnosis not present

## 2020-02-14 DIAGNOSIS — S12691A Other nondisplaced fracture of seventh cervical vertebra, initial encounter for closed fracture: Secondary | ICD-10-CM | POA: Diagnosis not present

## 2020-02-14 DIAGNOSIS — R488 Other symbolic dysfunctions: Secondary | ICD-10-CM | POA: Diagnosis not present

## 2020-02-14 DIAGNOSIS — R2681 Unsteadiness on feet: Secondary | ICD-10-CM | POA: Diagnosis not present

## 2020-02-15 DIAGNOSIS — R488 Other symbolic dysfunctions: Secondary | ICD-10-CM | POA: Diagnosis not present

## 2020-02-15 DIAGNOSIS — R41841 Cognitive communication deficit: Secondary | ICD-10-CM | POA: Diagnosis not present

## 2020-02-18 DIAGNOSIS — R488 Other symbolic dysfunctions: Secondary | ICD-10-CM | POA: Diagnosis not present

## 2020-02-18 DIAGNOSIS — R41841 Cognitive communication deficit: Secondary | ICD-10-CM | POA: Diagnosis not present

## 2020-02-18 DIAGNOSIS — M6281 Muscle weakness (generalized): Secondary | ICD-10-CM | POA: Diagnosis not present

## 2020-02-18 DIAGNOSIS — R296 Repeated falls: Secondary | ICD-10-CM | POA: Diagnosis not present

## 2020-02-19 DIAGNOSIS — R41841 Cognitive communication deficit: Secondary | ICD-10-CM | POA: Diagnosis not present

## 2020-02-19 DIAGNOSIS — R2681 Unsteadiness on feet: Secondary | ICD-10-CM | POA: Diagnosis not present

## 2020-02-19 DIAGNOSIS — R488 Other symbolic dysfunctions: Secondary | ICD-10-CM | POA: Diagnosis not present

## 2020-02-19 DIAGNOSIS — Z20828 Contact with and (suspected) exposure to other viral communicable diseases: Secondary | ICD-10-CM | POA: Diagnosis not present

## 2020-02-19 DIAGNOSIS — Z1159 Encounter for screening for other viral diseases: Secondary | ICD-10-CM | POA: Diagnosis not present

## 2020-02-19 DIAGNOSIS — S12691A Other nondisplaced fracture of seventh cervical vertebra, initial encounter for closed fracture: Secondary | ICD-10-CM | POA: Diagnosis not present

## 2020-02-19 DIAGNOSIS — R296 Repeated falls: Secondary | ICD-10-CM | POA: Diagnosis not present

## 2020-02-19 DIAGNOSIS — M6281 Muscle weakness (generalized): Secondary | ICD-10-CM | POA: Diagnosis not present

## 2020-02-20 DIAGNOSIS — R2681 Unsteadiness on feet: Secondary | ICD-10-CM | POA: Diagnosis not present

## 2020-02-20 DIAGNOSIS — S12691A Other nondisplaced fracture of seventh cervical vertebra, initial encounter for closed fracture: Secondary | ICD-10-CM | POA: Diagnosis not present

## 2020-02-20 DIAGNOSIS — M6281 Muscle weakness (generalized): Secondary | ICD-10-CM | POA: Diagnosis not present

## 2020-02-21 DIAGNOSIS — R488 Other symbolic dysfunctions: Secondary | ICD-10-CM | POA: Diagnosis not present

## 2020-02-21 DIAGNOSIS — N390 Urinary tract infection, site not specified: Secondary | ICD-10-CM | POA: Diagnosis not present

## 2020-02-21 DIAGNOSIS — M6281 Muscle weakness (generalized): Secondary | ICD-10-CM | POA: Diagnosis not present

## 2020-02-21 DIAGNOSIS — R296 Repeated falls: Secondary | ICD-10-CM | POA: Diagnosis not present

## 2020-02-22 DIAGNOSIS — R296 Repeated falls: Secondary | ICD-10-CM | POA: Diagnosis not present

## 2020-02-22 DIAGNOSIS — R41841 Cognitive communication deficit: Secondary | ICD-10-CM | POA: Diagnosis not present

## 2020-02-22 DIAGNOSIS — M6281 Muscle weakness (generalized): Secondary | ICD-10-CM | POA: Diagnosis not present

## 2020-02-22 DIAGNOSIS — R488 Other symbolic dysfunctions: Secondary | ICD-10-CM | POA: Diagnosis not present

## 2020-02-22 DIAGNOSIS — Z1159 Encounter for screening for other viral diseases: Secondary | ICD-10-CM | POA: Diagnosis not present

## 2020-02-22 DIAGNOSIS — Z20828 Contact with and (suspected) exposure to other viral communicable diseases: Secondary | ICD-10-CM | POA: Diagnosis not present

## 2020-02-25 DIAGNOSIS — R488 Other symbolic dysfunctions: Secondary | ICD-10-CM | POA: Diagnosis not present

## 2020-02-25 DIAGNOSIS — M6281 Muscle weakness (generalized): Secondary | ICD-10-CM | POA: Diagnosis not present

## 2020-02-25 DIAGNOSIS — S12691A Other nondisplaced fracture of seventh cervical vertebra, initial encounter for closed fracture: Secondary | ICD-10-CM | POA: Diagnosis not present

## 2020-02-25 DIAGNOSIS — R2681 Unsteadiness on feet: Secondary | ICD-10-CM | POA: Diagnosis not present

## 2020-02-25 DIAGNOSIS — R296 Repeated falls: Secondary | ICD-10-CM | POA: Diagnosis not present

## 2020-02-26 DIAGNOSIS — Z1159 Encounter for screening for other viral diseases: Secondary | ICD-10-CM | POA: Diagnosis not present

## 2020-02-26 DIAGNOSIS — Z20828 Contact with and (suspected) exposure to other viral communicable diseases: Secondary | ICD-10-CM | POA: Diagnosis not present

## 2020-02-26 DIAGNOSIS — R488 Other symbolic dysfunctions: Secondary | ICD-10-CM | POA: Diagnosis not present

## 2020-02-26 DIAGNOSIS — R296 Repeated falls: Secondary | ICD-10-CM | POA: Diagnosis not present

## 2020-02-26 DIAGNOSIS — R41841 Cognitive communication deficit: Secondary | ICD-10-CM | POA: Diagnosis not present

## 2020-02-26 DIAGNOSIS — M6281 Muscle weakness (generalized): Secondary | ICD-10-CM | POA: Diagnosis not present

## 2020-02-27 DIAGNOSIS — M6281 Muscle weakness (generalized): Secondary | ICD-10-CM | POA: Diagnosis not present

## 2020-02-27 DIAGNOSIS — R41841 Cognitive communication deficit: Secondary | ICD-10-CM | POA: Diagnosis not present

## 2020-02-27 DIAGNOSIS — R2681 Unsteadiness on feet: Secondary | ICD-10-CM | POA: Diagnosis not present

## 2020-02-27 DIAGNOSIS — S12691A Other nondisplaced fracture of seventh cervical vertebra, initial encounter for closed fracture: Secondary | ICD-10-CM | POA: Diagnosis not present

## 2020-02-27 DIAGNOSIS — R296 Repeated falls: Secondary | ICD-10-CM | POA: Diagnosis not present

## 2020-02-27 DIAGNOSIS — R488 Other symbolic dysfunctions: Secondary | ICD-10-CM | POA: Diagnosis not present

## 2020-02-29 DIAGNOSIS — S12691A Other nondisplaced fracture of seventh cervical vertebra, initial encounter for closed fracture: Secondary | ICD-10-CM | POA: Diagnosis not present

## 2020-02-29 DIAGNOSIS — R488 Other symbolic dysfunctions: Secondary | ICD-10-CM | POA: Diagnosis not present

## 2020-02-29 DIAGNOSIS — R41841 Cognitive communication deficit: Secondary | ICD-10-CM | POA: Diagnosis not present

## 2020-02-29 DIAGNOSIS — R2681 Unsteadiness on feet: Secondary | ICD-10-CM | POA: Diagnosis not present

## 2020-02-29 DIAGNOSIS — M6281 Muscle weakness (generalized): Secondary | ICD-10-CM | POA: Diagnosis not present

## 2020-03-03 DIAGNOSIS — R296 Repeated falls: Secondary | ICD-10-CM | POA: Diagnosis not present

## 2020-03-03 DIAGNOSIS — R41841 Cognitive communication deficit: Secondary | ICD-10-CM | POA: Diagnosis not present

## 2020-03-03 DIAGNOSIS — R488 Other symbolic dysfunctions: Secondary | ICD-10-CM | POA: Diagnosis not present

## 2020-03-03 DIAGNOSIS — M6281 Muscle weakness (generalized): Secondary | ICD-10-CM | POA: Diagnosis not present

## 2020-03-04 DIAGNOSIS — S12691A Other nondisplaced fracture of seventh cervical vertebra, initial encounter for closed fracture: Secondary | ICD-10-CM | POA: Diagnosis not present

## 2020-03-04 DIAGNOSIS — R488 Other symbolic dysfunctions: Secondary | ICD-10-CM | POA: Diagnosis not present

## 2020-03-04 DIAGNOSIS — R2681 Unsteadiness on feet: Secondary | ICD-10-CM | POA: Diagnosis not present

## 2020-03-04 DIAGNOSIS — R41841 Cognitive communication deficit: Secondary | ICD-10-CM | POA: Diagnosis not present

## 2020-03-04 DIAGNOSIS — R296 Repeated falls: Secondary | ICD-10-CM | POA: Diagnosis not present

## 2020-03-04 DIAGNOSIS — M6281 Muscle weakness (generalized): Secondary | ICD-10-CM | POA: Diagnosis not present

## 2020-03-05 DIAGNOSIS — M6281 Muscle weakness (generalized): Secondary | ICD-10-CM | POA: Diagnosis not present

## 2020-03-05 DIAGNOSIS — R488 Other symbolic dysfunctions: Secondary | ICD-10-CM | POA: Diagnosis not present

## 2020-03-05 DIAGNOSIS — R296 Repeated falls: Secondary | ICD-10-CM | POA: Diagnosis not present

## 2020-03-07 DIAGNOSIS — M6281 Muscle weakness (generalized): Secondary | ICD-10-CM | POA: Diagnosis not present

## 2020-03-07 DIAGNOSIS — R41841 Cognitive communication deficit: Secondary | ICD-10-CM | POA: Diagnosis not present

## 2020-03-07 DIAGNOSIS — R2681 Unsteadiness on feet: Secondary | ICD-10-CM | POA: Diagnosis not present

## 2020-03-07 DIAGNOSIS — S12691A Other nondisplaced fracture of seventh cervical vertebra, initial encounter for closed fracture: Secondary | ICD-10-CM | POA: Diagnosis not present

## 2020-03-07 DIAGNOSIS — R488 Other symbolic dysfunctions: Secondary | ICD-10-CM | POA: Diagnosis not present

## 2020-03-10 DIAGNOSIS — M6281 Muscle weakness (generalized): Secondary | ICD-10-CM | POA: Diagnosis not present

## 2020-03-10 DIAGNOSIS — R488 Other symbolic dysfunctions: Secondary | ICD-10-CM | POA: Diagnosis not present

## 2020-03-10 DIAGNOSIS — R41841 Cognitive communication deficit: Secondary | ICD-10-CM | POA: Diagnosis not present

## 2020-03-10 DIAGNOSIS — R296 Repeated falls: Secondary | ICD-10-CM | POA: Diagnosis not present

## 2020-03-11 DIAGNOSIS — R488 Other symbolic dysfunctions: Secondary | ICD-10-CM | POA: Diagnosis not present

## 2020-03-11 DIAGNOSIS — M6281 Muscle weakness (generalized): Secondary | ICD-10-CM | POA: Diagnosis not present

## 2020-03-11 DIAGNOSIS — Z20828 Contact with and (suspected) exposure to other viral communicable diseases: Secondary | ICD-10-CM | POA: Diagnosis not present

## 2020-03-11 DIAGNOSIS — R41841 Cognitive communication deficit: Secondary | ICD-10-CM | POA: Diagnosis not present

## 2020-03-11 DIAGNOSIS — Z1159 Encounter for screening for other viral diseases: Secondary | ICD-10-CM | POA: Diagnosis not present

## 2020-03-11 DIAGNOSIS — R296 Repeated falls: Secondary | ICD-10-CM | POA: Diagnosis not present

## 2020-03-12 DIAGNOSIS — R41841 Cognitive communication deficit: Secondary | ICD-10-CM | POA: Diagnosis not present

## 2020-03-12 DIAGNOSIS — R296 Repeated falls: Secondary | ICD-10-CM | POA: Diagnosis not present

## 2020-03-12 DIAGNOSIS — M6281 Muscle weakness (generalized): Secondary | ICD-10-CM | POA: Diagnosis not present

## 2020-03-12 DIAGNOSIS — R488 Other symbolic dysfunctions: Secondary | ICD-10-CM | POA: Diagnosis not present

## 2020-03-13 DIAGNOSIS — R2681 Unsteadiness on feet: Secondary | ICD-10-CM | POA: Diagnosis not present

## 2020-03-13 DIAGNOSIS — R296 Repeated falls: Secondary | ICD-10-CM | POA: Diagnosis not present

## 2020-03-13 DIAGNOSIS — R488 Other symbolic dysfunctions: Secondary | ICD-10-CM | POA: Diagnosis not present

## 2020-03-13 DIAGNOSIS — S12691A Other nondisplaced fracture of seventh cervical vertebra, initial encounter for closed fracture: Secondary | ICD-10-CM | POA: Diagnosis not present

## 2020-03-13 DIAGNOSIS — M6281 Muscle weakness (generalized): Secondary | ICD-10-CM | POA: Diagnosis not present

## 2020-03-14 DIAGNOSIS — M6281 Muscle weakness (generalized): Secondary | ICD-10-CM | POA: Diagnosis not present

## 2020-03-14 DIAGNOSIS — R2681 Unsteadiness on feet: Secondary | ICD-10-CM | POA: Diagnosis not present

## 2020-03-14 DIAGNOSIS — S12691A Other nondisplaced fracture of seventh cervical vertebra, initial encounter for closed fracture: Secondary | ICD-10-CM | POA: Diagnosis not present

## 2020-03-14 DIAGNOSIS — Z20828 Contact with and (suspected) exposure to other viral communicable diseases: Secondary | ICD-10-CM | POA: Diagnosis not present

## 2020-03-14 DIAGNOSIS — Z1159 Encounter for screening for other viral diseases: Secondary | ICD-10-CM | POA: Diagnosis not present

## 2020-03-17 DIAGNOSIS — R41841 Cognitive communication deficit: Secondary | ICD-10-CM | POA: Diagnosis not present

## 2020-03-17 DIAGNOSIS — R488 Other symbolic dysfunctions: Secondary | ICD-10-CM | POA: Diagnosis not present

## 2020-03-18 DIAGNOSIS — R2681 Unsteadiness on feet: Secondary | ICD-10-CM | POA: Diagnosis not present

## 2020-03-18 DIAGNOSIS — Z20828 Contact with and (suspected) exposure to other viral communicable diseases: Secondary | ICD-10-CM | POA: Diagnosis not present

## 2020-03-18 DIAGNOSIS — Z1159 Encounter for screening for other viral diseases: Secondary | ICD-10-CM | POA: Diagnosis not present

## 2020-03-18 DIAGNOSIS — M6281 Muscle weakness (generalized): Secondary | ICD-10-CM | POA: Diagnosis not present

## 2020-03-18 DIAGNOSIS — S12691A Other nondisplaced fracture of seventh cervical vertebra, initial encounter for closed fracture: Secondary | ICD-10-CM | POA: Diagnosis not present

## 2020-03-19 DIAGNOSIS — R41841 Cognitive communication deficit: Secondary | ICD-10-CM | POA: Diagnosis not present

## 2020-03-19 DIAGNOSIS — M6281 Muscle weakness (generalized): Secondary | ICD-10-CM | POA: Diagnosis not present

## 2020-03-19 DIAGNOSIS — R296 Repeated falls: Secondary | ICD-10-CM | POA: Diagnosis not present

## 2020-03-19 DIAGNOSIS — R488 Other symbolic dysfunctions: Secondary | ICD-10-CM | POA: Diagnosis not present

## 2020-03-20 DIAGNOSIS — R488 Other symbolic dysfunctions: Secondary | ICD-10-CM | POA: Diagnosis not present

## 2020-03-20 DIAGNOSIS — R296 Repeated falls: Secondary | ICD-10-CM | POA: Diagnosis not present

## 2020-03-20 DIAGNOSIS — M6281 Muscle weakness (generalized): Secondary | ICD-10-CM | POA: Diagnosis not present

## 2020-03-21 DIAGNOSIS — R488 Other symbolic dysfunctions: Secondary | ICD-10-CM | POA: Diagnosis not present

## 2020-03-21 DIAGNOSIS — R296 Repeated falls: Secondary | ICD-10-CM | POA: Diagnosis not present

## 2020-03-21 DIAGNOSIS — Z1159 Encounter for screening for other viral diseases: Secondary | ICD-10-CM | POA: Diagnosis not present

## 2020-03-21 DIAGNOSIS — M6281 Muscle weakness (generalized): Secondary | ICD-10-CM | POA: Diagnosis not present

## 2020-03-21 DIAGNOSIS — Z20828 Contact with and (suspected) exposure to other viral communicable diseases: Secondary | ICD-10-CM | POA: Diagnosis not present

## 2020-03-24 DIAGNOSIS — R296 Repeated falls: Secondary | ICD-10-CM | POA: Diagnosis not present

## 2020-03-24 DIAGNOSIS — R488 Other symbolic dysfunctions: Secondary | ICD-10-CM | POA: Diagnosis not present

## 2020-03-24 DIAGNOSIS — M6281 Muscle weakness (generalized): Secondary | ICD-10-CM | POA: Diagnosis not present

## 2020-03-25 DIAGNOSIS — Z1159 Encounter for screening for other viral diseases: Secondary | ICD-10-CM | POA: Diagnosis not present

## 2020-03-25 DIAGNOSIS — R296 Repeated falls: Secondary | ICD-10-CM | POA: Diagnosis not present

## 2020-03-25 DIAGNOSIS — Z20828 Contact with and (suspected) exposure to other viral communicable diseases: Secondary | ICD-10-CM | POA: Diagnosis not present

## 2020-03-25 DIAGNOSIS — M6281 Muscle weakness (generalized): Secondary | ICD-10-CM | POA: Diagnosis not present

## 2020-03-25 DIAGNOSIS — R488 Other symbolic dysfunctions: Secondary | ICD-10-CM | POA: Diagnosis not present

## 2020-03-25 DIAGNOSIS — R41841 Cognitive communication deficit: Secondary | ICD-10-CM | POA: Diagnosis not present

## 2020-03-26 DIAGNOSIS — R41841 Cognitive communication deficit: Secondary | ICD-10-CM | POA: Diagnosis not present

## 2020-03-26 DIAGNOSIS — R2681 Unsteadiness on feet: Secondary | ICD-10-CM | POA: Diagnosis not present

## 2020-03-26 DIAGNOSIS — R488 Other symbolic dysfunctions: Secondary | ICD-10-CM | POA: Diagnosis not present

## 2020-03-26 DIAGNOSIS — M6281 Muscle weakness (generalized): Secondary | ICD-10-CM | POA: Diagnosis not present

## 2020-03-26 DIAGNOSIS — S12691A Other nondisplaced fracture of seventh cervical vertebra, initial encounter for closed fracture: Secondary | ICD-10-CM | POA: Diagnosis not present

## 2020-03-26 DIAGNOSIS — R296 Repeated falls: Secondary | ICD-10-CM | POA: Diagnosis not present

## 2020-03-27 DIAGNOSIS — M6281 Muscle weakness (generalized): Secondary | ICD-10-CM | POA: Diagnosis not present

## 2020-03-27 DIAGNOSIS — S12691A Other nondisplaced fracture of seventh cervical vertebra, initial encounter for closed fracture: Secondary | ICD-10-CM | POA: Diagnosis not present

## 2020-03-27 DIAGNOSIS — R2681 Unsteadiness on feet: Secondary | ICD-10-CM | POA: Diagnosis not present

## 2020-03-28 DIAGNOSIS — R488 Other symbolic dysfunctions: Secondary | ICD-10-CM | POA: Diagnosis not present

## 2020-03-28 DIAGNOSIS — Z1159 Encounter for screening for other viral diseases: Secondary | ICD-10-CM | POA: Diagnosis not present

## 2020-03-28 DIAGNOSIS — Z20828 Contact with and (suspected) exposure to other viral communicable diseases: Secondary | ICD-10-CM | POA: Diagnosis not present

## 2020-03-28 DIAGNOSIS — R41841 Cognitive communication deficit: Secondary | ICD-10-CM | POA: Diagnosis not present

## 2020-03-30 NOTE — Progress Notes (Signed)
Cardiology Office Note:    Date:  03/31/2020   ID:  Sean Lozano, DOB 25-Aug-1933, MRN [id number]  PCP:  Biagio Borg, MD  [facility] Cardiologist:  Sherren Mocha, MD   [facility] Electrophysiologist:  None   Referring MD: Biagio Borg, MD   Chief Complaint:  Follow-up (CAD) and Leg Swelling    Patient Profile:    Sean Lozano is a 86 y.o. male with:   Coronary artery disease   S/p CABG in 1992  S/p PCI to the S-OM and native LAD beyond the LIMA in 2012  S/p NSTEMI in 9/13 due to ISR in S-OM >> PCI: POBA to S-OM  Bradycardia  Admitted 06/2018 >> beta-blocker was [facility] with improved HR  Carotid artery disease  S/p L CEA in 2014  Followed by [facility] (Dr. Carlis Abbott)   Subdural hematoma; fell off ladder in 2014  Hypertension   Hyperlipidemia   Diabetes mellitus   GERD   Prior CV studies: Echocardiogram 10/03/2018 EF 60-65, Gr 1 DD, normal RVSF, mild AV sclerosis, trivial TR, normal RVSP  Carotid US 08/07/19 Bilateral ICA 1-39  Carotid US 04/21/2018 L CEA patent; bilat ICA 1-39  Echo 12/05/17 Mild conc LVH, EF 65-70, no RWMA, Gr 1 DD, mild TR, PASP 21, trivial pericardial eff  Cardiac Catheterization 12/21/11 Left mainstem: tandem lesions of 60, 70, and 90 leading to the av circ.  The AV circ shows a small branch and the OMs are occluded.  Left anterior descending (LAD): totally occluded proximally Left circumflex (LCx): see above Right coronary artery (RCA): total occlusion proximally SVG to the diagonal is occluded. SVG to the distal RCA is occluded. SVG to the two OM branches has been stented in three locations. The ostial stent has 30% narrowing. The mid stent has 95% thrombotic in stent restenosis. The distal stent to the second branch is widely patent.  LIMA to LAD is patent. LAD stent (beyond graft) patent. EF 55-65%, there is no significant mitral regurgitation  PCI: POBA to S-OM1/OM2   History of Present Illness:    Mr.  Lozano was set up for a Holter in 06/2018 after an admission for bradycardia requiring his beta-blocker to be stopped.  He could not apply the monitor and it was never completed.  He was last seen in clinic by Leanor Kail, PA-C in 03/2019.  He returns for f/u.  He is here with his son.  The pt lives at [place].  His wife died earlier this year.  He has had worsening issues with dementia.  He has fallen 3 times.  He fractured some cervical vertebrae in 11/2019.  His head CT was neg for bleeding.  He has not had chest pain, syncope, orthopnea.  He ambulates with a walker and may be short of breath at times.  He has noted leg edema recently.  It may be better after ambulation.  He is getting some compression socks soon.  Of note, he was losing weight up until recently.  He has been drinking ensure.        Past Medical History:  Diagnosis Date  . 1st degree AV block   . Arthritis   . Asthma   . BARRETTS ESOPHAGUS 06/02/2007  . Benign prostatic hypertrophy   . Bladder neck obstruction 09/29/2010  . CAD (coronary artery disease)    a. CABG x 6 in 1992. b. s/p DES x 2 to SVG -> OM1/OM2 in 09/2010. c. s/p DES to mLAD 10/2010.  d. 12/2011 NSTEMI (off DAPT) s/p PTCA of ISR of DES in SVG-OM, Recs for lifelong DAPT   . Carotid artery disease ([place])    Doppler, May, 2014, 5-40% R. ICA, 98-11% LICA, with recent syncope patient sent for consult with vascular surgery  . Carotid bruit    April, 2014  . Cervical disc disease   . Concussion July 09, 2012   Scalp  . Depression   . Diabetes mellitus    type II-diet  . Diverticulosis   . Echocardiogram    Echo 09/2018: EF 60-65, Gr 1 DD, mild AoV sclerosis, trivial TR  . Ejection fraction    EF 60%, echo, June, 2012  . Fall at home July 09, 2012  . GERD (gastroesophageal reflux disease)   . Hearing loss 2014  . Hx of CABG    1992  . Hyperlipidemia    intolerance to Crestor Zocor Vytorin. He tolerates Pravachol...low HDL  . Paresthesias     successful surgery by Dr. Earnie Larsson  . Prostatitis   . Rosacea   . Sinus bradycardia    asymptomatic   . SKIN LESION 06/02/2007  . Squamous cell carcinoma of skin 08/22/2019   moderately differentiated on right buccal cheek(excision) margins free  . Subdural hematoma (HCC)    Subdural hematoma secondary to falling off a ladder  April, 2014,  medical therapy,  subdural did not expand while blood levels of ddual antiplatelet therapy decreased over one week.  Episode of chest pain in the hospital,, dual antiplatelet therapy restarted approximately one week after it was held  . Syncope    The patient lost consciousness and hit his head July 09 2012, not clear yet if he had true syncope    Current Medications: Current Meds  Medication Sig  . acetaminophen (TYLENOL) 500 MG tablet Take 500 mg by mouth every 6 (six) hours as needed (pain or temp greater than 100.5).  Marland Kitchen aspirin 81 MG chewable tablet Chew 81 mg by mouth at bedtime.  . Calcium Carb-Cholecalciferol (CALCIUM CARBONATE-VITAMIN D3) 600-400 MG-UNIT TABS Take 1 tablet by mouth daily.  . clopidogrel (PLAVIX) 75 MG tablet TAKE 1 TABLET BY MOUTH EVERY DAY  . Ensure Plus (ENSURE PLUS) LIQD Take 1 Can by mouth 2 (two) times daily between meals.  . Menthol, Topical Analgesic, (BIOFREEZE) 4 % GEL Apply 1 application topically 4 (four) times daily as needed (knee pain).  . Multiple Vitamins-Minerals (CERTAVITE SENIOR) TABS Take 1 tablet by mouth daily.  . nitroGLYCERIN (NITROSTAT) 0.4 MG SL tablet Place 1 tablet (0.4 mg total) under the tongue every 5 (five) minutes as needed for chest pain.  . polyethylene glycol (MIRALAX / GLYCOLAX) 17 g packet Take 17 g by mouth daily as needed (constipation).  . pravastatin (PRAVACHOL) 40 MG tablet TAKE 1 TABLET BY MOUTH EVERY DAY IN THE EVENING  . sertraline (ZOLOFT) 50 MG tablet Take 1 1/2 tablet by mouth daily (75 mg total)  . tamsulosin (FLOMAX) 0.4 MG CAPS capsule TAKE 1 CAPSULE (0.4 MG TOTAL) BY MOUTH  DAILY AFTER SUPPER.     Allergies:   Atenolol, Rosuvastatin, Simvastatin, Ultram [tramadol], Zofran [ondansetron hcl], Adhesive [tape], Codeine, and Ezetimibe-simvastatin   Social History   Tobacco Use  . Smoking status: Former Smoker    Types: Cigarettes    Start date: 01/03/1955  . Smokeless tobacco: Never Used  Vaping Use  . Vaping Use: Never used  Substance Use Topics  . Alcohol use: Not Currently    Comment: occ  .  Drug use: No     Family Hx: The patient's family history includes Aneurysm in an other family member; Diabetes in his mother, sister, and son; Heart attack in his father; Heart disease in his father and mother; Hyperlipidemia in his son and another family member; Hypertension in his son and son; Kidney disease in his sister; Liver cancer in an other family member.  ROS   EKGs/Labs/Other Test Reviewed:    EKG:  EKG is   ordered today.  The ekg ordered today demonstrates NSR, HR 62, leftward axis, first-degree AV block, PR interval 320 ms, QTC 430, no ST-T wave changes  Recent Labs: 03/31/2020: ALT 19; BUN 17; Creatinine, Ser 1.23; Hemoglobin 13.2; NT-Pro BNP WILL FOLLOW; Platelets 191; Potassium 4.1; Sodium 140; TSH WILL FOLLOW   Recent Lipid Panel Lab Results  Component Value Date/Time   CHOL 144 12/25/2018 04:50 PM   TRIG 152.0 (H) 12/25/2018 04:50 PM   HDL 36.40 (L) 12/25/2018 04:50 PM   CHOLHDL 4 12/25/2018 04:50 PM   LDLCALC 77 12/25/2018 04:50 PM   LDLDIRECT 87.6 09/19/2008 03:46 PM      Risk Assessment/Calculations:      Physical Exam:    VS:  BP (!) 102/58   Pulse 62   Ht 5\' 11"  (1.803 m)   Wt 184 lb (83.5 kg)   SpO2 95%   BMI 25.66 kg/m     Wt Readings from Last 3 Encounters:  03/31/20 184 lb (83.5 kg)  01/30/20 176 lb (79.8 kg)  11/15/19 180 lb (81.6 kg)     Constitutional:      Appearance: Healthy appearance. Not in distress.  Neck:     Vascular: No JVR.  Pulmonary:     Effort: Pulmonary effort is normal.     Breath  sounds: No wheezing. No rales.  Cardiovascular:     Normal rate. Regular rhythm. Normal S1. Normal S2.     Murmurs: There is no murmur.  Edema:    Pretibial: bilateral 1+ edema of the pretibial area.    Ankle: bilateral 2+ edema of the ankle. Abdominal:     Palpations: Abdomen is soft.  Skin:    General: Skin is warm and dry.  Neurological:     General: No focal deficit present.     Mental Status: Alert and oriented to person, place and time.     Cranial Nerves: Cranial nerves are intact.       ASSESSMENT & PLAN:    1. Leg edema 2. Shortness of breath Etiology not entirely clear.  He does have bilateral leg edema.  This may improve with elevation.  History is somewhat difficult given his dementia.  He did lose a significant amount of weight recently.  Hypoalbuminemia may be playing a role.  He is no longer on amlodipine.  He may be somewhat short of breath at times.  He does not have rales on exam and he does not have symptoms of orthopnea.  Question if this may all be related to venous insufficiency.  I think compression stockings, elevation will help quite significantly.  I will obtain labs today to include CMET, CBC, TSH and BNP.  If his BNP is significantly elevated, I will obtain an echocardiogram and place him on regular furosemide.  Otherwise, I have given him a prescription for furosemide 20 mg to take once daily as needed for significant swelling.  Follow-up in 3 months.  He knows to return sooner if his swelling or shortness of breath worsen.  3. Coronary artery disease involving native coronary artery of native heart without angina pectoris Hx of CABG in 1992, stenting to the S-OM and LAD beyond the LIMA in 2012 and NSTEMI 2/2 stent restenosis in the S-OM which was tx with POBA.  He is doing well without anginal symptoms.  Continue aspirin, clopidogrel, pravastatin.  4. Essential hypertension The patient's blood pressure is controlled on his current regimen.  Continue current  therapy.   5. Mixed hyperlipidemia LDL optimal on most recent lab work.  Continue current Rx.    6. Bradycardia Resolved after stopping beta-blocker.  He does have first-degree block on electrocardiogram.  None of his falls seem be related to syncope.  However, if he does have symptoms that sound concerning for syncope or near syncope in the future, we will need to place him on a monitor to rule out significant pauses.  7. Bilateral carotid artery stenosis Followed by vascular surgery.   Dispo:  Return in about 3 months (around 06/29/2020) for Routine Follow Up, w/ Dr. Burt Knack, or Richardson Dopp, PA-C.   Medication Adjustments/Labs and Tests Ordered: Current medicines are reviewed at length with the patient today.  Concerns regarding medicines are outlined above.  Tests Ordered: Orders Placed This Encounter  Procedures  . CBC  . Comprehensive metabolic panel  . TSH  . Pro b natriuretic peptide (BNP)  . EKG 12-Lead   Medication Changes: Meds ordered this encounter  Medications  . furosemide (LASIX) 20 MG tablet    Sig: Take 1 tablet (20 mg total) by mouth as needed for fluid or edema (significant edema).    Dispense:  30 tablet    Refill:  1    Signed, Richardson Dopp, PA-C  03/31/2020 4:47 PM    [facility] [address], [place], [place]  [address] Phone: [phone]; Fax: [phone]

## 2020-03-31 ENCOUNTER — Ambulatory Visit: Payer: Medicare PPO | Admitting: Physician Assistant

## 2020-03-31 ENCOUNTER — Other Ambulatory Visit: Payer: Self-pay

## 2020-03-31 ENCOUNTER — Encounter: Payer: Self-pay | Admitting: Physician Assistant

## 2020-03-31 ENCOUNTER — Telehealth: Payer: Self-pay | Admitting: Physician Assistant

## 2020-03-31 VITALS — BP 102/58 | HR 62 | Ht 71.0 in | Wt 184.0 lb

## 2020-03-31 DIAGNOSIS — R0602 Shortness of breath: Secondary | ICD-10-CM | POA: Diagnosis not present

## 2020-03-31 DIAGNOSIS — I2510 Atherosclerotic heart disease of native coronary artery without angina pectoris: Secondary | ICD-10-CM

## 2020-03-31 DIAGNOSIS — R488 Other symbolic dysfunctions: Secondary | ICD-10-CM | POA: Diagnosis not present

## 2020-03-31 DIAGNOSIS — R600 Localized edema: Secondary | ICD-10-CM | POA: Diagnosis not present

## 2020-03-31 DIAGNOSIS — H401134 Primary open-angle glaucoma, bilateral, indeterminate stage: Secondary | ICD-10-CM | POA: Diagnosis not present

## 2020-03-31 DIAGNOSIS — I6523 Occlusion and stenosis of bilateral carotid arteries: Secondary | ICD-10-CM

## 2020-03-31 DIAGNOSIS — E119 Type 2 diabetes mellitus without complications: Secondary | ICD-10-CM | POA: Diagnosis not present

## 2020-03-31 DIAGNOSIS — E782 Mixed hyperlipidemia: Secondary | ICD-10-CM | POA: Diagnosis not present

## 2020-03-31 DIAGNOSIS — R001 Bradycardia, unspecified: Secondary | ICD-10-CM | POA: Diagnosis not present

## 2020-03-31 DIAGNOSIS — M6281 Muscle weakness (generalized): Secondary | ICD-10-CM | POA: Diagnosis not present

## 2020-03-31 DIAGNOSIS — R296 Repeated falls: Secondary | ICD-10-CM | POA: Diagnosis not present

## 2020-03-31 DIAGNOSIS — I10 Essential (primary) hypertension: Secondary | ICD-10-CM

## 2020-03-31 DIAGNOSIS — Z961 Presence of intraocular lens: Secondary | ICD-10-CM | POA: Diagnosis not present

## 2020-03-31 MED ORDER — FUROSEMIDE 20 MG PO TABS
20.0000 mg | ORAL_TABLET | ORAL | 1 refills | Status: AC | PRN
Start: 2020-03-31 — End: ?

## 2020-03-31 NOTE — Telephone Encounter (Signed)
I called and spoke with Cyril Mourning at [facility], she states per state regulations there has to be a frequency on prescriptions as needed for swelling can not be used. Prescription needs to say once a day as needed for swelling. Cyril Mourning took verbal order to change directions to once a day as needed for swelling.

## 2020-03-31 NOTE — Patient Instructions (Addendum)
Medication Instructions:  Your physician has recommended you make the following change in your medication:   1) Start Furosemide 20 mg, 1 tablet by mouth as needed for significant edema  *If you need a refill on your cardiac medications before your next appointment, please call your pharmacy*  Lab Work: You will have labs drawn today: CMET/CBC/TSH/BNP  If you have labs (blood work) drawn today and your tests are completely normal, you will receive your results only by: Marland Kitchen MyChart Message (if you have MyChart) OR . A paper copy in the mail If you have any lab test that is abnormal or we need to change your treatment, we will call you to review the results.  Testing/Procedures: None ordered today  Follow-Up: On 07/02/2020 at 10:45AM with Richardson Dopp, PA-C  Other Instructions Follow up sooner if swelling or shortness of breath increases

## 2020-03-31 NOTE — Telephone Encounter (Signed)
Pt c/o medication issue:  1. Name of Medication: furosemide (LASIX) 20 MG tablet [[id number]]   2. How are you currently taking this medication (dosage and times per day)? N/A  3. Are you having a reaction (difficulty breathing--STAT)? N/A  4. What is your medication issue? Sean Lozano with [facility] states they received [facility] order for Lasix, but they did not receive medication instructions. Please resubmit and call to confirm.  Phone #: [phone] Fax #: [phone]

## 2020-04-01 DIAGNOSIS — R41841 Cognitive communication deficit: Secondary | ICD-10-CM | POA: Diagnosis not present

## 2020-04-01 DIAGNOSIS — Z20828 Contact with and (suspected) exposure to other viral communicable diseases: Secondary | ICD-10-CM | POA: Diagnosis not present

## 2020-04-01 DIAGNOSIS — R488 Other symbolic dysfunctions: Secondary | ICD-10-CM | POA: Diagnosis not present

## 2020-04-01 DIAGNOSIS — R296 Repeated falls: Secondary | ICD-10-CM | POA: Diagnosis not present

## 2020-04-01 DIAGNOSIS — M6281 Muscle weakness (generalized): Secondary | ICD-10-CM | POA: Diagnosis not present

## 2020-04-01 DIAGNOSIS — Z1159 Encounter for screening for other viral diseases: Secondary | ICD-10-CM | POA: Diagnosis not present

## 2020-04-01 LAB — COMPREHENSIVE METABOLIC PANEL
ALT: 19 IU/L (ref 0–44)
AST: 24 IU/L (ref 0–40)
Albumin/Globulin Ratio: 1.6 (ref 1.2–2.2)
Albumin: 4 g/dL (ref 3.6–4.6)
Alkaline Phosphatase: 81 IU/L (ref 44–121)
BUN/Creatinine Ratio: 14 (ref 10–24)
BUN: 17 mg/dL (ref 8–27)
Bilirubin Total: 0.6 mg/dL (ref 0.0–1.2)
CO2: 27 mmol/L (ref 20–29)
Calcium: 9.4 mg/dL (ref 8.6–10.2)
Chloride: 102 mmol/L (ref 96–106)
Creatinine, Ser: 1.23 mg/dL (ref 0.76–1.27)
GFR calc Af Amer: 62 mL/min/{1.73_m2} (ref >59)
GFR calc non Af Amer: 54 mL/min/{1.73_m2} — ABNORMAL LOW (ref >59)
Globulin, Total: 2.5 g/dL (ref 1.5–4.5)
Glucose: 98 mg/dL (ref 65–99)
Potassium: 4.1 mmol/L (ref 3.5–5.2)
Sodium: 140 mmol/L (ref 134–144)
Total Protein: 6.5 g/dL (ref 6.0–8.5)

## 2020-04-01 LAB — CBC
Hematocrit: 37.2 % — ABNORMAL LOW (ref 37.5–51.0)
Hemoglobin: 13.2 g/dL (ref 13.0–17.7)
MCH: 35.4 pg — ABNORMAL HIGH (ref 26.6–33.0)
MCHC: 35.5 g/dL (ref 31.5–35.7)
MCV: 100 fL — ABNORMAL HIGH (ref 79–97)
Platelets: 191 10*3/uL (ref 150–450)
RBC: 3.73 x10E6/uL — ABNORMAL LOW (ref 4.14–5.80)
RDW: 11.1 % — ABNORMAL LOW (ref 11.6–15.4)
WBC: 9.5 10*3/uL (ref 3.4–10.8)

## 2020-04-01 LAB — TSH: TSH: 3.13 u[IU]/mL (ref 0.450–4.500)

## 2020-04-01 LAB — PRO B NATRIURETIC PEPTIDE: NT-Pro BNP: 124 pg/mL (ref 0–486)

## 2020-04-02 DIAGNOSIS — R41841 Cognitive communication deficit: Secondary | ICD-10-CM | POA: Diagnosis not present

## 2020-04-02 DIAGNOSIS — R488 Other symbolic dysfunctions: Secondary | ICD-10-CM | POA: Diagnosis not present

## 2020-04-02 DIAGNOSIS — R296 Repeated falls: Secondary | ICD-10-CM | POA: Diagnosis not present

## 2020-04-02 DIAGNOSIS — M6281 Muscle weakness (generalized): Secondary | ICD-10-CM | POA: Diagnosis not present

## 2020-04-03 DIAGNOSIS — Z20828 Contact with and (suspected) exposure to other viral communicable diseases: Secondary | ICD-10-CM | POA: Diagnosis not present

## 2020-04-03 DIAGNOSIS — Z1159 Encounter for screening for other viral diseases: Secondary | ICD-10-CM | POA: Diagnosis not present

## 2020-04-08 DIAGNOSIS — Z20828 Contact with and (suspected) exposure to other viral communicable diseases: Secondary | ICD-10-CM | POA: Diagnosis not present

## 2020-04-08 DIAGNOSIS — Z1159 Encounter for screening for other viral diseases: Secondary | ICD-10-CM | POA: Diagnosis not present

## 2020-04-09 DIAGNOSIS — R296 Repeated falls: Secondary | ICD-10-CM | POA: Diagnosis not present

## 2020-04-09 DIAGNOSIS — M6281 Muscle weakness (generalized): Secondary | ICD-10-CM | POA: Diagnosis not present

## 2020-04-09 DIAGNOSIS — R41841 Cognitive communication deficit: Secondary | ICD-10-CM | POA: Diagnosis not present

## 2020-04-09 DIAGNOSIS — R488 Other symbolic dysfunctions: Secondary | ICD-10-CM | POA: Diagnosis not present

## 2020-04-10 DIAGNOSIS — M6281 Muscle weakness (generalized): Secondary | ICD-10-CM | POA: Diagnosis not present

## 2020-04-10 DIAGNOSIS — R296 Repeated falls: Secondary | ICD-10-CM | POA: Diagnosis not present

## 2020-04-10 DIAGNOSIS — R488 Other symbolic dysfunctions: Secondary | ICD-10-CM | POA: Diagnosis not present

## 2020-04-11 DIAGNOSIS — R488 Other symbolic dysfunctions: Secondary | ICD-10-CM | POA: Diagnosis not present

## 2020-04-11 DIAGNOSIS — Z1159 Encounter for screening for other viral diseases: Secondary | ICD-10-CM | POA: Diagnosis not present

## 2020-04-11 DIAGNOSIS — R296 Repeated falls: Secondary | ICD-10-CM | POA: Diagnosis not present

## 2020-04-11 DIAGNOSIS — Z20828 Contact with and (suspected) exposure to other viral communicable diseases: Secondary | ICD-10-CM | POA: Diagnosis not present

## 2020-04-11 DIAGNOSIS — M6281 Muscle weakness (generalized): Secondary | ICD-10-CM | POA: Diagnosis not present

## 2020-04-15 DIAGNOSIS — Z1159 Encounter for screening for other viral diseases: Secondary | ICD-10-CM | POA: Diagnosis not present

## 2020-04-15 DIAGNOSIS — Z20828 Contact with and (suspected) exposure to other viral communicable diseases: Secondary | ICD-10-CM | POA: Diagnosis not present

## 2020-04-18 DIAGNOSIS — Z20828 Contact with and (suspected) exposure to other viral communicable diseases: Secondary | ICD-10-CM | POA: Diagnosis not present

## 2020-04-18 DIAGNOSIS — Z1159 Encounter for screening for other viral diseases: Secondary | ICD-10-CM | POA: Diagnosis not present

## 2020-04-29 DIAGNOSIS — Z20828 Contact with and (suspected) exposure to other viral communicable diseases: Secondary | ICD-10-CM | POA: Diagnosis not present

## 2020-05-01 DIAGNOSIS — Z20828 Contact with and (suspected) exposure to other viral communicable diseases: Secondary | ICD-10-CM | POA: Diagnosis not present

## 2020-05-05 ENCOUNTER — Emergency Department (HOSPITAL_COMMUNITY): Payer: Medicare PPO

## 2020-05-05 ENCOUNTER — Emergency Department (HOSPITAL_COMMUNITY)
Admission: EM | Admit: 2020-05-05 | Discharge: 2020-05-05 | Disposition: A | Discharge status: Home / Self Care | Payer: Medicare PPO | Attending: Emergency Medicine | Admitting: Emergency Medicine

## 2020-05-05 ENCOUNTER — Other Ambulatory Visit: Payer: Self-pay

## 2020-05-05 DIAGNOSIS — I110 Hypertensive heart disease with heart failure: Secondary | ICD-10-CM | POA: Insufficient documentation

## 2020-05-05 DIAGNOSIS — Z85828 Personal history of other malignant neoplasm of skin: Secondary | ICD-10-CM | POA: Diagnosis not present

## 2020-05-05 DIAGNOSIS — J45909 Unspecified asthma, uncomplicated: Secondary | ICD-10-CM | POA: Insufficient documentation

## 2020-05-05 DIAGNOSIS — R410 Disorientation, unspecified: Secondary | ICD-10-CM | POA: Diagnosis not present

## 2020-05-05 DIAGNOSIS — R600 Localized edema: Secondary | ICD-10-CM | POA: Diagnosis not present

## 2020-05-05 DIAGNOSIS — E119 Type 2 diabetes mellitus without complications: Secondary | ICD-10-CM | POA: Diagnosis not present

## 2020-05-05 DIAGNOSIS — Z7901 Long term (current) use of anticoagulants: Secondary | ICD-10-CM | POA: Insufficient documentation

## 2020-05-05 DIAGNOSIS — Z951 Presence of aortocoronary bypass graft: Secondary | ICD-10-CM | POA: Insufficient documentation

## 2020-05-05 DIAGNOSIS — Z87891 Personal history of nicotine dependence: Secondary | ICD-10-CM | POA: Diagnosis not present

## 2020-05-05 DIAGNOSIS — I2510 Atherosclerotic heart disease of native coronary artery without angina pectoris: Secondary | ICD-10-CM | POA: Insufficient documentation

## 2020-05-05 DIAGNOSIS — I10 Essential (primary) hypertension: Secondary | ICD-10-CM | POA: Diagnosis not present

## 2020-05-05 DIAGNOSIS — R4182 Altered mental status, unspecified: Secondary | ICD-10-CM | POA: Diagnosis not present

## 2020-05-05 DIAGNOSIS — Z7982 Long term (current) use of aspirin: Secondary | ICD-10-CM | POA: Diagnosis not present

## 2020-05-05 DIAGNOSIS — F0390 Unspecified dementia without behavioral disturbance: Secondary | ICD-10-CM

## 2020-05-05 DIAGNOSIS — Z79899 Other long term (current) drug therapy: Secondary | ICD-10-CM | POA: Insufficient documentation

## 2020-05-05 DIAGNOSIS — I5032 Chronic diastolic (congestive) heart failure: Secondary | ICD-10-CM | POA: Insufficient documentation

## 2020-05-05 DIAGNOSIS — R55 Syncope and collapse: Secondary | ICD-10-CM | POA: Diagnosis not present

## 2020-05-05 LAB — URINALYSIS, ROUTINE W REFLEX MICROSCOPIC
Bilirubin Urine: NEGATIVE
Glucose, UA: NEGATIVE mg/dL
Hgb urine dipstick: NEGATIVE
Ketones, ur: NEGATIVE mg/dL
Leukocytes,Ua: NEGATIVE
Nitrite: NEGATIVE
Protein, ur: NEGATIVE mg/dL
Specific Gravity, Urine: 1.01 (ref 1.005–1.030)
pH: 7 (ref 5.0–8.0)

## 2020-05-05 LAB — COMPREHENSIVE METABOLIC PANEL
ALT: 22 U/L (ref 0–44)
AST: 32 U/L (ref 15–41)
Albumin: 3.7 g/dL (ref 3.5–5.0)
Alkaline Phosphatase: 64 U/L (ref 38–126)
Anion gap: 6 (ref 5–15)
BUN: 17 mg/dL (ref 8–23)
CO2: 27 mmol/L (ref 22–32)
Calcium: 9.6 mg/dL (ref 8.9–10.3)
Chloride: 102 mmol/L (ref 98–111)
Creatinine, Ser: 1.14 mg/dL (ref 0.61–1.24)
GFR, Estimated: >60 mL/min (ref >60)
Glucose, Bld: 101 mg/dL — ABNORMAL HIGH (ref 70–99)
Potassium: 4.6 mmol/L (ref 3.5–5.1)
Sodium: 135 mmol/L (ref 135–145)
Total Bilirubin: 0.9 mg/dL (ref 0.3–1.2)
Total Protein: 6.6 g/dL (ref 6.5–8.1)

## 2020-05-05 LAB — TSH: TSH: 3.196 u[IU]/mL (ref 0.350–4.500)

## 2020-05-05 LAB — CBC
HCT: 42.6 % (ref 39.0–52.0)
Hemoglobin: 13.7 g/dL (ref 13.0–17.0)
MCH: 33.9 pg (ref 26.0–34.0)
MCHC: 32.2 g/dL (ref 30.0–36.0)
MCV: 105.4 fL — ABNORMAL HIGH (ref 80.0–100.0)
Platelets: 174 10*3/uL (ref 150–400)
RBC: 4.04 MIL/uL — ABNORMAL LOW (ref 4.22–5.81)
RDW: 13 % (ref 11.5–15.5)
WBC: 9.6 10*3/uL (ref 4.0–10.5)
nRBC: 0 % (ref 0.0–0.2)

## 2020-05-05 LAB — LACTIC ACID, PLASMA: Lactic Acid, Venous: 1.5 mmol/L (ref 0.5–1.9)

## 2020-05-05 LAB — BRAIN NATRIURETIC PEPTIDE: B Natriuretic Peptide: 41.9 pg/mL (ref 0.0–100.0)

## 2020-05-05 LAB — CBG MONITORING, ED: Glucose-Capillary: 82 mg/dL (ref 70–99)

## 2020-05-05 NOTE — ED Provider Notes (Signed)
[facility] EMERGENCY DEPARTMENT Provider Note   CSN: [id number] Arrival date & time: 05/05/20  1304     History Chief Complaint  Patient presents with  . Altered Mental Status    Sean Lozano is a 87 y.o. male.  HPI Patient is an 87 year old male with past medical history discussed below notably history of DM, CAD s/p CABG, depression, asthma, first-degree AV block, CHF, subdural hematoma, syncope, dementia  Patient brought in by EMS history of dementia here from facility after having an episode of not responding to verbal stimulus while in the cafeteria around lunchtime today.  Patient states that he has no complaints but is not oriented apart from being oriented to himself.  Son is at bedside unable to corroborate that he is at approximately his mental baseline.  Son states that he has good days and bad days with his dementia and sees a geriatrician.  He states that this is not definitely worse and he has been seen in the past.  Discussed with [name] nurse tech. She states that patient was in the cafeteria today and confirms the patient did not fall or hit his head.  He did not have any complaints prior to the episode where he became withdrawn did not respond.  He was breathing and had a pulse throughout the entire episode.  Level 5 caveat d/t dementia / AMS     Past Medical History:  Diagnosis Date  . 1st degree AV block   . Arthritis   . Asthma   . BARRETTS ESOPHAGUS 06/02/2007  . Benign prostatic hypertrophy   . Bladder neck obstruction 09/29/2010  . CAD (coronary artery disease)    a. CABG x 6 in 1992. b. s/p DES x 2 to SVG -> OM1/OM2 in 09/2010. c. s/p DES to mLAD 10/2010. d. 12/2011 NSTEMI (off DAPT) s/p PTCA of ISR of DES in SVG-OM, Recs for lifelong DAPT   . Carotid artery disease ([place])    Doppler, May, 2014, [id number] R. ICA, [id number] LICA, with recent syncope patient sent for consult with vascular surgery  . Carotid bruit    April, 2014  .  Cervical disc disease   . Concussion July 09, 2012   Scalp  . Depression   . Diabetes mellitus    type II-diet  . Diverticulosis   . Echocardiogram    Echo 09/2018: EF 60-65, Gr 1 DD, mild AoV sclerosis, trivial TR  . Ejection fraction    EF 60%, echo, June, 2012  . Fall at home July 09, 2012  . GERD (gastroesophageal reflux disease)   . Hearing loss 2014  . Hx of CABG    1992  . Hyperlipidemia    intolerance to Crestor Zocor Vytorin. He tolerates Pravachol...low HDL  . Paresthesias    successful surgery by Dr. Earnie Larsson  . Prostatitis   . Rosacea   . Sinus bradycardia    asymptomatic   . SKIN LESION 06/02/2007  . Squamous cell carcinoma of skin 08/22/2019   moderately differentiated on right buccal cheek(excision) margins free  . Subdural hematoma (HCC)    Subdural hematoma secondary to falling off a ladder  April, 2014,  medical therapy,  subdural did not expand while blood levels of ddual antiplatelet therapy decreased over one week.  Episode of chest pain in the hospital,, dual antiplatelet therapy restarted approximately one week after it was held  . Syncope    The patient lost consciousness and hit his head July 09 2012,  not clear yet if he had true syncope    Patient Active Problem List   Diagnosis Date Noted  . Bradycardia 06/14/2018  . Fatigue 06/14/2018  . Chest pain 12/02/2017  . Atypical chest pain 12/02/2017  . Epigastric pain   . Essential hypertension   . Fall   . Weakness due to acute stroke ([place]) 10/20/2017  . Stroke ([place]) 10/20/2017  . Elevated MCV 06/08/2017  . Back pain 06/08/2017  . Bilateral leg pain 12/01/2016  . Knee pain 09/19/2013  . Occlusion and stenosis of carotid artery without mention of cerebral infarction 08/22/2012  . Carotid artery disease ([place])   . Syncope   . Chronic diastolic congestive heart failure, NYHA class 1/ECHO 2010 07/10/2012  . SDH (subdural hematoma) ([place]) 07/09/2012  . Erectile dysfunction 04/01/2011  . Ejection  fraction   . CAD (coronary artery disease)   . Hyperlipidemia   . Diabetes ([place])   . Hx of CABG   . Preventative health care 09/29/2010  . Dyspnea 09/29/2010  . Bladder neck obstruction 09/29/2010  . Depression 06/04/2007  . Asthma 06/04/2007  . GERD 06/04/2007  . BENIGN PROSTATIC HYPERTROPHY 06/04/2007  . BARRETTS ESOPHAGUS 06/02/2007  . Rosacea 06/02/2007  . Skin lesion 06/02/2007  . FATIGUE 06/02/2007    Past Surgical History:  Procedure Laterality Date  . APPENDECTOMY    . Barretts Esophagus   20/20/09  . Bladder neck obstruction  09/29/10  . C-spine disc surgery  03/2006  . C4-5 surgery  01/2009  . CARDIAC CATHETERIZATION  July 2012   stent placed  X's  4  . COCCYX FRACTURE SURGERY  1997  . CORONARY ARTERY BYPASS GRAFT  1992  . ENDARTERECTOMY Left 09/07/2012   Procedure: ENDARTERECTOMY CAROTID with patch angioplasty;  Surgeon: Mal Misty, MD;  Location: [facility];  Service: Vascular;  Laterality: Left;  . LEFT HEART CATHETERIZATION WITH CORONARY/GRAFT ANGIOGRAM N/A 12/21/2011   Procedure: LEFT HEART CATHETERIZATION WITH Beatrix Fetters;  Surgeon: Hillary Bow, MD;  Location: [facility] CATH LAB;  Service: Cardiovascular;  Laterality: N/A;  . TONSILLECTOMY         Family History  Problem Relation Age of Onset  . Heart disease Father        mother  . Heart attack Father   . Diabetes Sister        mother  . Kidney disease Sister   . Hypertension Son   . Hyperlipidemia Son   . Hypertension Son   . Diabetes Mother   . Heart disease Mother   . Diabetes Son   . Aneurysm Other   . Hyperlipidemia Other   . Liver cancer Other        grandfather    Social History   Tobacco Use  . Smoking status: Former Smoker    Types: Cigarettes    Start date: 01/03/1955  . Smokeless tobacco: Never Used  Vaping Use  . Vaping Use: Never used  Substance Use Topics  . Alcohol use: Not Currently    Comment: occ  . Drug use: No    Home Medications Prior to Admission  medications   Medication Sig Start Date End Date Taking? Authorizing Provider  acetaminophen (TYLENOL) 500 MG tablet Take 500 mg by mouth every 6 (six) hours as needed (pain or temp greater than 100.5).    [provider]  aspirin 81 MG chewable tablet Chew 81 mg by mouth at bedtime.    [provider]  Calcium Carb-Cholecalciferol (CALCIUM CARBONATE-VITAMIN D3)  600-400 MG-UNIT TABS Take 1 tablet by mouth daily.    [provider]  clopidogrel (PLAVIX) 75 MG tablet TAKE 1 TABLET BY MOUTH EVERY DAY 05/28/19   Sherren Mocha, MD  Ensure Plus (ENSURE PLUS) LIQD Take 1 Can by mouth 2 (two) times daily between meals.    [provider]  furosemide (LASIX) 20 MG tablet Take 1 tablet (20 mg total) by mouth as needed for fluid or edema (significant edema). 03/31/20   Richardson Dopp T, PA-C  Menthol, Topical Analgesic, (BIOFREEZE) 4 % GEL Apply 1 application topically 4 (four) times daily as needed (knee pain).    [provider]  Multiple Vitamins-Minerals (CERTAVITE SENIOR) TABS Take 1 tablet by mouth daily.    [provider]  nitroGLYCERIN (NITROSTAT) 0.4 MG SL tablet Place 1 tablet (0.4 mg total) under the tongue every 5 (five) minutes as needed for chest pain. 05/03/18   Sherren Mocha, MD  polyethylene glycol (MIRALAX / GLYCOLAX) 17 g packet Take 17 g by mouth daily as needed (constipation).    [provider]  pravastatin (PRAVACHOL) 40 MG tablet TAKE 1 TABLET BY MOUTH EVERY DAY IN THE EVENING 07/02/19   Sherren Mocha, MD  sertraline (ZOLOFT) 50 MG tablet Take 1 1/2 tablet by mouth daily (75 mg total)    [provider]  tamsulosin (FLOMAX) 0.4 MG CAPS capsule TAKE 1 CAPSULE (0.4 MG TOTAL) BY MOUTH DAILY AFTER SUPPER. 06/25/19   Biagio Borg, MD    Allergies    Atenolol, Rosuvastatin, Simvastatin, Ultram [tramadol], Zofran [ondansetron hcl], Adhesive [tape], Codeine, and Ezetimibe-simvastatin  Review of Systems   Review of  Systems  Unable to perform ROS: Dementia    Physical Exam Updated Vital Signs BP (!) 154/79   Pulse (!) 57   Temp 98 F (36.7 C) (Oral)   Resp (!) 21   SpO2 98%   Physical Exam Vitals and nursing note reviewed.  Constitutional:      General: He is not in acute distress. HENT:     Head: Normocephalic and atraumatic.     Nose: Nose normal.     Mouth/Throat:     Mouth: Mucous membranes are moist.  Eyes:     General: No scleral icterus. Neck:     Comments: Faint JVD present. Cardiovascular:     Rate and Rhythm: Normal rate and regular rhythm.     Pulses: Normal pulses.     Heart sounds: Normal heart sounds.  Pulmonary:     Effort: Pulmonary effort is normal. No respiratory distress.     Breath sounds: No wheezing.  Abdominal:     Palpations: Abdomen is soft.     Tenderness: There is no abdominal tenderness. There is no guarding or rebound.  Musculoskeletal:     Cervical back: Normal range of motion and neck supple. No tenderness.     Right lower leg: Edema present.     Left lower leg: Edema present.     Comments: Symmetric lower extremity edema 1+ to the midshin  No bony tenderness over joints or long bones of the upper and lower extremities.     No neck or back midline tenderness, step-off, deformity, or bruising. Able to turn head left and right 45 degrees without difficulty.  Full range of motion of upper and lower extremity joints shown after palpation was conducted; with 5/5 symmetrical strength in upper and lower extremities. No chest wall tenderness, no facial or cranial tenderness.   Patient has intact sensation  grossly in lower and upper extremities. Intact patellar and ankle reflexes. Patient able to ambulate without difficulty.  Radial and DP pulses palpated BL.   Skin:    General: Skin is warm and dry.     Capillary Refill: Capillary refill takes less than 2 seconds.     Comments: No bruising or rashes or abscesses. Feet are unremarkable.  Neurological:      Mental Status: He is alert. Mental status is at baseline.     Comments: Oriented to self  Speaking quietly. Speech is clear.  Strength 5/5 in upper/lower extremities  Sensation intact in upper/lower extremities   Negative Romberg. No pronator drift.  CN I not tested  CN II grossly intact visual fields bilaterally. Did not visualize posterior eye.   CN III, IV, VI PERRLA and EOMs intact bilaterally  CN V Intact sensation to sharp and light touch to the face  CN VII facial movements symmetric  CN VIII not tested  CN IX, X no uvula deviation, symmetric rise of soft palate  CN XI 5/5 SCM and trapezius strength bilaterally  CN XII Midline tongue protrusion, symmetric L/R movements   Psychiatric:        Mood and Affect: Mood normal.        Behavior: Behavior normal.     ED Results / Procedures / Treatments   Labs (all labs ordered are listed, but only abnormal results are displayed) Labs Reviewed  COMPREHENSIVE METABOLIC PANEL - Abnormal; Notable for the following components:      Result Value   Glucose, Bld 101 (*)    All other components within normal limits  CBC - Abnormal; Notable for the following components:   RBC 4.04 (*)    MCV 105.4 (*)    All other components within normal limits  TSH  LACTIC ACID, PLASMA  BRAIN NATRIURETIC PEPTIDE  URINALYSIS, ROUTINE W REFLEX MICROSCOPIC  LACTIC ACID, PLASMA  CBG MONITORING, ED    EKG EKG Interpretation  Date/Time:  Monday May 05 2020 13:16:14 EST Ventricular Rate:  59 PR Interval:    QRS Duration: 100 QT Interval:  445 QTC Calculation: 441 R Axis:   -27 Text Interpretation: Sinus or ectopic atrial rhythm Short PR interval Borderline left axis deviation Abnormal R-wave progression, early transition Baseline wander in lead(s) V1 Confirmed by Aletta Edouard [phone]) on 05/05/2020 1:28:19 PM   Radiology CT HEAD WO CONTRAST  Result Date: 05/05/2020 CLINICAL DATA:  Altered mental status. Syncopal episode with  subsequent unresponsiveness. Confusion. EXAM: CT HEAD WITHOUT CONTRAST TECHNIQUE: Contiguous axial images were obtained from the base of the skull through the vertex without intravenous contrast. COMPARISON:  11/17/2019 FINDINGS: Brain: Age related atrophy. Chronic small-vessel ischemic changes of the cerebral hemispheric white matter. No sign of acute infarction, mass lesion, hemorrhage, hydrocephalus or extra-axial collection. Vascular: There is atherosclerotic calcification of the major vessels at the base of the brain. Skull: Negative Sinuses/Orbits: Clear/normal Other: None IMPRESSION: No acute finding by CT. Age related atrophy and chronic small-vessel ischemic changes of the white matter. Electronically Signed   By: Nelson Chimes M.D.   On: 05/05/2020 14:05   DG Chest Portable 1 View  Result Date: 05/05/2020 CLINICAL DATA:  Syncope EXAM: PORTABLE CHEST 1 VIEW COMPARISON:  11/17/2019 FINDINGS: Status post median sternotomy and CABG. Both lungs are clear. The visualized skeletal structures are unremarkable. IMPRESSION: No acute abnormality of the lungs in AP portable projection. Electronically Signed   By: Eddie Candle M.D.   On: 05/05/2020 13:46  Procedures Procedures   Medications Ordered in ED Medications - No data to display  ED Course  I have reviewed the triage vital signs and the nursing notes.  Pertinent labs & imaging results that were available during my care of the patient were reviewed by me and considered in my medical decision making (see chart for details).  Patient is well-appearing 87 year old male with dementia presenting today with episode of not responding to questions.  Physical exam is unremarkable he is only oriented to self.  Neuro exam is reassuring and trauma exam is benign.  CT head unremarkable.  Plain film of chest is without infiltrate or abnormality.  EKG nonischemic.  CMP without abnormality.  CBC without leukocytosis mild stable anemia.  TSH within normal  limits and lactic x1 within normal limits.  Clinical Course as of 05/05/20 1534  Mon May 05, 2662  3162 87 year old male with history of dementia here from his facility after having an unresponsive episode before lunch.  Patient himself denies any complaints but is not oriented.  Son is here who says he is close to baseline.  He seen fluctuations in behavior before.  Getting some screening labs CT head chest x-ray urinalysis.  Possibly can return back to facility if no acute findings [MB]    Clinical Course User Index [MB] Hayden Rasmussen, MD   MDM Rules/Calculators/A&P                          UA pending at this time as well as BNP. Suspect patient will be reasonable for discharge and follow-up with primary care doctor on outpatient basis.  Patient care transferred to [facility] at time of shift change 3:34 PM.   Final Clinical Impression(s) / ED Diagnoses Final diagnoses:  Dementia without behavioral disturbance, unspecified dementia type ([place])  Altered mental status, unspecified altered mental status type    Rx / DC Orders ED Discharge Orders    None       Tedd Sias, [place] 05/05/20 1534    Hayden Rasmussen, MD 05/05/20 1753

## 2020-05-05 NOTE — Discharge Instructions (Signed)
Please follow-up with your primary care doctor.  Please also follow-up with your geriatrician.  Please continue taking her medications as prescribed.

## 2020-05-05 NOTE — ED Triage Notes (Signed)
Pt was about to eat lunch, had syncopal  Episode and was unresponsive for 20 mins. GCS 3. Without intervention pt became more alert while in the ambulance; however pt still confused. LSN 1200 by family.

## 2020-05-05 NOTE — ED Notes (Signed)
[facility] for [facility]

## 2020-05-05 NOTE — ED Provider Notes (Signed)
Care assumed from [facility], PA-C at shift change with Urine pending.   In brief, this patient is a 87 y.o. M with PMH/o Dementia, CAD, CABG, brought in from [place] home who presents for evaluation of episode of decreased alertness.  Per EMS, patient was in the cafeteria and seemed less alert.  He was having difficulty responding to verbal stimuli.  Patient denies any complaints.  He is alert and oriented to himself.  Son reports that patient is at his baseline. Please see note from previous provider for full history/physical exam.    Physical Exam  BP (!) 156/86 (BP Location: Right Arm)   Pulse 61   Temp 98.1 F (36.7 C) (Oral)   Resp 15   Ht 6' (1.829 m)   Wt 83.9 kg   SpO2 95%   BMI 25.09 kg/m   Physical Exam   Alert and oriented.  No acute distress.  Benign abdominal exam.  ED Course/Procedures   Clinical Course as of 05/05/20 1651  Mon May 05, 7469  [id number] 87 year old male with history of dementia here from his facility after having an unresponsive episode before lunch.  Patient himself denies any complaints but is not oriented.  Son is here who says he is close to baseline.  He seen fluctuations in behavior before.  Getting some screening labs CT head chest x-ray urinalysis.  Possibly can return back to facility if no acute findings [MB]    Clinical Course User Index [MB] Hayden Rasmussen, MD    Procedures  Results for orders placed or performed during the hospital encounter of 05/05/20 (from the past 24 hour(s))  Comprehensive metabolic panel     Status: Abnormal   Collection Time: 05/05/20  1:35 PM  Result Value Ref Range   Sodium 135 135 - 145 mmol/L   Potassium 4.6 3.5 - 5.1 mmol/L   Chloride 102 98 - 111 mmol/L   CO2 27 22 - 32 mmol/L   Glucose, Bld 101 (H) 70 - 99 mg/dL   BUN 17 8 - 23 mg/dL   Creatinine, Ser 1.14 0.61 - 1.24 mg/dL   Calcium 9.6 8.9 - 10.3 mg/dL   Total Protein 6.6 6.5 - 8.1 g/dL   Albumin 3.7 3.5 - 5.0 g/dL   AST 32 15  - 41 U/L   ALT 22 0 - 44 U/L   Alkaline Phosphatase 64 38 - 126 U/L   Total Bilirubin 0.9 0.3 - 1.2 mg/dL   GFR, Estimated >60 >60 mL/min   Anion gap 6 5 - 15  CBC     Status: Abnormal   Collection Time: 05/05/20  1:35 PM  Result Value Ref Range   WBC 9.6 4.0 - 10.5 K/uL   RBC 4.04 (L) 4.22 - 5.81 MIL/uL   Hemoglobin 13.7 13.0 - 17.0 g/dL   HCT 42.6 39.0 - 52.0 %   MCV 105.4 (H) 80.0 - 100.0 fL   MCH 33.9 26.0 - 34.0 pg   MCHC 32.2 30.0 - 36.0 g/dL   RDW 13.0 11.5 - 15.5 %   Platelets 174 150 - 400 K/uL   nRBC 0.0 0.0 - 0.2 %  Brain natriuretic peptide     Status: None   Collection Time: 05/05/20  1:35 PM  Result Value Ref Range   B Natriuretic Peptide 41.9 0.0 - 100.0 pg/mL  Urinalysis, Routine w reflex microscopic     Status: None   Collection Time: 05/05/20  1:35 PM  Result Value Ref Range  Color, Urine YELLOW YELLOW   APPearance CLEAR CLEAR   Specific Gravity, Urine 1.010 1.005 - 1.030   pH 7.0 5.0 - 8.0   Glucose, UA NEGATIVE NEGATIVE mg/dL   Hgb urine dipstick NEGATIVE NEGATIVE   Bilirubin Urine NEGATIVE NEGATIVE   Ketones, ur NEGATIVE NEGATIVE mg/dL   Protein, ur NEGATIVE NEGATIVE mg/dL   Nitrite NEGATIVE NEGATIVE   Leukocytes,Ua NEGATIVE NEGATIVE  TSH     Status: None   Collection Time: 05/05/20  1:35 PM  Result Value Ref Range   TSH 3.196 0.350 - 4.500 uIU/mL  Lactic acid, plasma     Status: None   Collection Time: 05/05/20  1:36 PM  Result Value Ref Range   Lactic Acid, Venous 1.5 0.5 - 1.9 mmol/L  CBG monitoring, ED     Status: None   Collection Time: 05/05/20  1:44 PM  Result Value Ref Range   Glucose-Capillary 82 70 - 99 mg/dL     MDM   PLAN: Patient pending urine.  Anticipate discharge home.  MDM:  Lactic is normal.  BMP is normal.  CBD normal.  CBC shows no leukocytosis.  Hemoglobin stable.  CMP shows normal BUN and creatinine.  UA negative for any infectious etiology.  CT head negative.  Chest x-ray unremarkable.  I discussed with both  patient and son who is at bedside.  Son confirms to me that patient is at his baseline.  Patient is alert, denies any complaints at this time.  Patient stable for discharge per previous providers instructions. At this time, patient exhibits no emergent life-threatening condition that require further evaluation in ED. Patient and son had ample opportunity for questions and discussion. All patient's questions were answered with full understanding. Strict return precautions discussed. Son expresses understanding and agreement to plan.    1. Dementia without behavioral disturbance, unspecified dementia type ([place])   2. Altered mental status, unspecified altered mental status type    Portions of this note were generated with [employer] dictation software. Dictation errors may occur despite best attempts at proofreading.    Volanda Napoleon, PA-C 05/05/20 1651    Lucrezia Starch, MD 05/06/20 [phone]

## 2020-05-26 DIAGNOSIS — Z20828 Contact with and (suspected) exposure to other viral communicable diseases: Secondary | ICD-10-CM | POA: Diagnosis not present

## 2020-05-29 DIAGNOSIS — Z20828 Contact with and (suspected) exposure to other viral communicable diseases: Secondary | ICD-10-CM | POA: Diagnosis not present

## 2020-06-03 DIAGNOSIS — Z111 Encounter for screening for respiratory tuberculosis: Secondary | ICD-10-CM | POA: Diagnosis not present

## 2020-06-03 DIAGNOSIS — N390 Urinary tract infection, site not specified: Secondary | ICD-10-CM | POA: Diagnosis not present

## 2020-06-04 DIAGNOSIS — R279 Unspecified lack of coordination: Secondary | ICD-10-CM | POA: Diagnosis not present

## 2020-06-04 DIAGNOSIS — M6281 Muscle weakness (generalized): Secondary | ICD-10-CM | POA: Diagnosis not present

## 2020-06-04 DIAGNOSIS — R262 Difficulty in walking, not elsewhere classified: Secondary | ICD-10-CM | POA: Diagnosis not present

## 2020-06-05 DIAGNOSIS — M6281 Muscle weakness (generalized): Secondary | ICD-10-CM | POA: Diagnosis not present

## 2020-06-05 DIAGNOSIS — R41841 Cognitive communication deficit: Secondary | ICD-10-CM | POA: Diagnosis not present

## 2020-06-05 DIAGNOSIS — N390 Urinary tract infection, site not specified: Secondary | ICD-10-CM | POA: Diagnosis not present

## 2020-06-05 DIAGNOSIS — Z20828 Contact with and (suspected) exposure to other viral communicable diseases: Secondary | ICD-10-CM | POA: Diagnosis not present

## 2020-06-05 DIAGNOSIS — R488 Other symbolic dysfunctions: Secondary | ICD-10-CM | POA: Diagnosis not present

## 2020-06-05 DIAGNOSIS — R296 Repeated falls: Secondary | ICD-10-CM | POA: Diagnosis not present

## 2020-06-06 DIAGNOSIS — R279 Unspecified lack of coordination: Secondary | ICD-10-CM | POA: Diagnosis not present

## 2020-06-06 DIAGNOSIS — M6281 Muscle weakness (generalized): Secondary | ICD-10-CM | POA: Diagnosis not present

## 2020-06-06 DIAGNOSIS — R262 Difficulty in walking, not elsewhere classified: Secondary | ICD-10-CM | POA: Diagnosis not present

## 2020-06-09 DIAGNOSIS — M6281 Muscle weakness (generalized): Secondary | ICD-10-CM | POA: Diagnosis not present

## 2020-06-09 DIAGNOSIS — R279 Unspecified lack of coordination: Secondary | ICD-10-CM | POA: Diagnosis not present

## 2020-06-09 DIAGNOSIS — Z20828 Contact with and (suspected) exposure to other viral communicable diseases: Secondary | ICD-10-CM | POA: Diagnosis not present

## 2020-06-09 DIAGNOSIS — R262 Difficulty in walking, not elsewhere classified: Secondary | ICD-10-CM | POA: Diagnosis not present

## 2020-06-10 DIAGNOSIS — R279 Unspecified lack of coordination: Secondary | ICD-10-CM | POA: Diagnosis not present

## 2020-06-10 DIAGNOSIS — R296 Repeated falls: Secondary | ICD-10-CM | POA: Diagnosis not present

## 2020-06-10 DIAGNOSIS — M6281 Muscle weakness (generalized): Secondary | ICD-10-CM | POA: Diagnosis not present

## 2020-06-10 DIAGNOSIS — R41841 Cognitive communication deficit: Secondary | ICD-10-CM | POA: Diagnosis not present

## 2020-06-10 DIAGNOSIS — R488 Other symbolic dysfunctions: Secondary | ICD-10-CM | POA: Diagnosis not present

## 2020-06-10 DIAGNOSIS — R262 Difficulty in walking, not elsewhere classified: Secondary | ICD-10-CM | POA: Diagnosis not present

## 2020-06-12 DIAGNOSIS — R262 Difficulty in walking, not elsewhere classified: Secondary | ICD-10-CM | POA: Diagnosis not present

## 2020-06-12 DIAGNOSIS — R488 Other symbolic dysfunctions: Secondary | ICD-10-CM | POA: Diagnosis not present

## 2020-06-12 DIAGNOSIS — Z20828 Contact with and (suspected) exposure to other viral communicable diseases: Secondary | ICD-10-CM | POA: Diagnosis not present

## 2020-06-12 DIAGNOSIS — M6281 Muscle weakness (generalized): Secondary | ICD-10-CM | POA: Diagnosis not present

## 2020-06-12 DIAGNOSIS — R296 Repeated falls: Secondary | ICD-10-CM | POA: Diagnosis not present

## 2020-06-12 DIAGNOSIS — R279 Unspecified lack of coordination: Secondary | ICD-10-CM | POA: Diagnosis not present

## 2020-06-12 DIAGNOSIS — R41841 Cognitive communication deficit: Secondary | ICD-10-CM | POA: Diagnosis not present

## 2020-06-13 DIAGNOSIS — R488 Other symbolic dysfunctions: Secondary | ICD-10-CM | POA: Diagnosis not present

## 2020-06-13 DIAGNOSIS — R41841 Cognitive communication deficit: Secondary | ICD-10-CM | POA: Diagnosis not present

## 2020-06-16 DIAGNOSIS — R262 Difficulty in walking, not elsewhere classified: Secondary | ICD-10-CM | POA: Diagnosis not present

## 2020-06-16 DIAGNOSIS — M6281 Muscle weakness (generalized): Secondary | ICD-10-CM | POA: Diagnosis not present

## 2020-06-16 DIAGNOSIS — Z20828 Contact with and (suspected) exposure to other viral communicable diseases: Secondary | ICD-10-CM | POA: Diagnosis not present

## 2020-06-16 DIAGNOSIS — R41841 Cognitive communication deficit: Secondary | ICD-10-CM | POA: Diagnosis not present

## 2020-06-16 DIAGNOSIS — R488 Other symbolic dysfunctions: Secondary | ICD-10-CM | POA: Diagnosis not present

## 2020-06-16 DIAGNOSIS — R279 Unspecified lack of coordination: Secondary | ICD-10-CM | POA: Diagnosis not present

## 2020-06-17 DIAGNOSIS — R488 Other symbolic dysfunctions: Secondary | ICD-10-CM | POA: Diagnosis not present

## 2020-06-17 DIAGNOSIS — M6281 Muscle weakness (generalized): Secondary | ICD-10-CM | POA: Diagnosis not present

## 2020-06-17 DIAGNOSIS — R41841 Cognitive communication deficit: Secondary | ICD-10-CM | POA: Diagnosis not present

## 2020-06-17 DIAGNOSIS — R296 Repeated falls: Secondary | ICD-10-CM | POA: Diagnosis not present

## 2020-06-18 DIAGNOSIS — R279 Unspecified lack of coordination: Secondary | ICD-10-CM | POA: Diagnosis not present

## 2020-06-18 DIAGNOSIS — R296 Repeated falls: Secondary | ICD-10-CM | POA: Diagnosis not present

## 2020-06-18 DIAGNOSIS — R262 Difficulty in walking, not elsewhere classified: Secondary | ICD-10-CM | POA: Diagnosis not present

## 2020-06-18 DIAGNOSIS — M6281 Muscle weakness (generalized): Secondary | ICD-10-CM | POA: Diagnosis not present

## 2020-06-19 DIAGNOSIS — R488 Other symbolic dysfunctions: Secondary | ICD-10-CM | POA: Diagnosis not present

## 2020-06-19 DIAGNOSIS — R296 Repeated falls: Secondary | ICD-10-CM | POA: Diagnosis not present

## 2020-06-19 DIAGNOSIS — R41841 Cognitive communication deficit: Secondary | ICD-10-CM | POA: Diagnosis not present

## 2020-06-19 DIAGNOSIS — M6281 Muscle weakness (generalized): Secondary | ICD-10-CM | POA: Diagnosis not present

## 2020-06-20 DIAGNOSIS — R279 Unspecified lack of coordination: Secondary | ICD-10-CM | POA: Diagnosis not present

## 2020-06-20 DIAGNOSIS — M6281 Muscle weakness (generalized): Secondary | ICD-10-CM | POA: Diagnosis not present

## 2020-06-20 DIAGNOSIS — R262 Difficulty in walking, not elsewhere classified: Secondary | ICD-10-CM | POA: Diagnosis not present

## 2020-06-23 DIAGNOSIS — M6281 Muscle weakness (generalized): Secondary | ICD-10-CM | POA: Diagnosis not present

## 2020-06-23 DIAGNOSIS — Z20828 Contact with and (suspected) exposure to other viral communicable diseases: Secondary | ICD-10-CM | POA: Diagnosis not present

## 2020-06-23 DIAGNOSIS — R296 Repeated falls: Secondary | ICD-10-CM | POA: Diagnosis not present

## 2020-06-24 DIAGNOSIS — M6281 Muscle weakness (generalized): Secondary | ICD-10-CM | POA: Diagnosis not present

## 2020-06-24 DIAGNOSIS — R41841 Cognitive communication deficit: Secondary | ICD-10-CM | POA: Diagnosis not present

## 2020-06-24 DIAGNOSIS — R279 Unspecified lack of coordination: Secondary | ICD-10-CM | POA: Diagnosis not present

## 2020-06-24 DIAGNOSIS — R488 Other symbolic dysfunctions: Secondary | ICD-10-CM | POA: Diagnosis not present

## 2020-06-24 DIAGNOSIS — R262 Difficulty in walking, not elsewhere classified: Secondary | ICD-10-CM | POA: Diagnosis not present

## 2020-06-24 DIAGNOSIS — R296 Repeated falls: Secondary | ICD-10-CM | POA: Diagnosis not present

## 2020-06-25 DIAGNOSIS — M6281 Muscle weakness (generalized): Secondary | ICD-10-CM | POA: Diagnosis not present

## 2020-06-25 DIAGNOSIS — R41841 Cognitive communication deficit: Secondary | ICD-10-CM | POA: Diagnosis not present

## 2020-06-25 DIAGNOSIS — R488 Other symbolic dysfunctions: Secondary | ICD-10-CM | POA: Diagnosis not present

## 2020-06-25 DIAGNOSIS — R296 Repeated falls: Secondary | ICD-10-CM | POA: Diagnosis not present

## 2020-06-26 DIAGNOSIS — R41841 Cognitive communication deficit: Secondary | ICD-10-CM | POA: Diagnosis not present

## 2020-06-26 DIAGNOSIS — R488 Other symbolic dysfunctions: Secondary | ICD-10-CM | POA: Diagnosis not present

## 2020-06-27 DIAGNOSIS — R279 Unspecified lack of coordination: Secondary | ICD-10-CM | POA: Diagnosis not present

## 2020-06-27 DIAGNOSIS — M6281 Muscle weakness (generalized): Secondary | ICD-10-CM | POA: Diagnosis not present

## 2020-06-27 DIAGNOSIS — R262 Difficulty in walking, not elsewhere classified: Secondary | ICD-10-CM | POA: Diagnosis not present

## 2020-06-28 DIAGNOSIS — R279 Unspecified lack of coordination: Secondary | ICD-10-CM | POA: Diagnosis not present

## 2020-06-28 DIAGNOSIS — R262 Difficulty in walking, not elsewhere classified: Secondary | ICD-10-CM | POA: Diagnosis not present

## 2020-06-28 DIAGNOSIS — M6281 Muscle weakness (generalized): Secondary | ICD-10-CM | POA: Diagnosis not present

## 2020-06-30 DIAGNOSIS — R262 Difficulty in walking, not elsewhere classified: Secondary | ICD-10-CM | POA: Diagnosis not present

## 2020-06-30 DIAGNOSIS — Z20828 Contact with and (suspected) exposure to other viral communicable diseases: Secondary | ICD-10-CM | POA: Diagnosis not present

## 2020-06-30 DIAGNOSIS — R296 Repeated falls: Secondary | ICD-10-CM | POA: Diagnosis not present

## 2020-06-30 DIAGNOSIS — M6281 Muscle weakness (generalized): Secondary | ICD-10-CM | POA: Diagnosis not present

## 2020-06-30 DIAGNOSIS — R279 Unspecified lack of coordination: Secondary | ICD-10-CM | POA: Diagnosis not present

## 2020-07-01 DIAGNOSIS — M6281 Muscle weakness (generalized): Secondary | ICD-10-CM | POA: Diagnosis not present

## 2020-07-01 DIAGNOSIS — R296 Repeated falls: Secondary | ICD-10-CM | POA: Diagnosis not present

## 2020-07-01 DIAGNOSIS — R41841 Cognitive communication deficit: Secondary | ICD-10-CM | POA: Diagnosis not present

## 2020-07-01 DIAGNOSIS — R262 Difficulty in walking, not elsewhere classified: Secondary | ICD-10-CM | POA: Diagnosis not present

## 2020-07-01 DIAGNOSIS — R279 Unspecified lack of coordination: Secondary | ICD-10-CM | POA: Diagnosis not present

## 2020-07-01 DIAGNOSIS — R488 Other symbolic dysfunctions: Secondary | ICD-10-CM | POA: Diagnosis not present

## 2020-07-01 NOTE — Progress Notes (Deleted)
Cardiology Office Note:    Date:  07/01/2020   ID:  Sean Lozano, DOB October 06, 1933, MRN [id number]  PCP:  Biagio Borg, MD   [place]  Cardiologist:  Sherren Mocha, MD *** Advanced Practice Provider:  No care team member to display Electrophysiologist:  None  {Press F2 to show EP APP, CHF, sleep or structural heart MD               :[id number]}  { Click here to update then REFRESH NOTE - MD (PCP) or APP (Team Member)  Change PCP Type for MD, Specialty for APP is either Cardiology or Clinical Cardiac Electrophysiology  :[id number]}   Referring MD: Biagio Borg, MD   Chief Complaint:  No chief complaint on file.    Patient Profile:    Sean Lozano is a 86 y.o. male with:   Coronary artery disease  ? S/p CABG in 07/15/90 ? S/p PCI to the S-OM and native LAD beyond the LIMA in 2010/07/15 ? S/p NSTEMI in 9/13 due to ISR in S-OM >> PCI: POBA to S-OM  Bradycardia ? Admitted 07/15/18 >> beta-blocker was DCd with improved HR  Carotid artery disease ? S/p L CEA in Jul 14, 2012 ? Followed by [facility] (Dr. Carlis Abbott)   Subdural hematoma; fell off ladder in 07-14-12  Hypertension   Hyperlipidemia   Diabetes mellitus   GERD   Prior CV studies: Echocardiogram 10/03/2018 EF 60-65, Gr 1 DD, normal RVSF, mild AV sclerosis, trivial TR, normal RVSP  Carotid US 08/07/19 Bilateral ICA 1-39  Carotid US 04/21/2018 L CEA patent; bilat ICA 1-39  Echo 12/05/17 Mild conc LVH, EF 65-70, no RWMA, Gr 1 DD, mild TR, PASP 21, trivial pericardial eff  Cardiac Catheterization9/10/13 Left mainstem: tandem lesions of 60, 70, and 90 leading to the av circ.  The AV circ shows a small branch and the OMs are occluded.  Left anterior descending (LAD): totally occluded proximally Left circumflex (LCx): see above Right coronary artery (RCA): total occlusion proximally SVG to the diagonal is occluded. SVG to the distal RCA is occluded. SVG to the two OM branches has been stented in three  locations. The ostial stent has 30% narrowing. The mid stent has 95% thrombotic in stent restenosis. The distal stent to the second branch is widely patent.  LIMA to LAD is patent. LAD stent (beyond graft) patent. EF 55-65%, there is no significant mitral regurgitation  PCI: POBA to S-OM1/OM2  History of Present Illness:    Sean Lozano was set up for a Holter in 07-15-2018 after an admission for bradycardia requiring his beta-blocker to be stopped.  He could not apply the monitor and it was never completed.  He lives at [facility].  His wife died in 07/15/2019.  He has had worsening issues with dementia.  He has fallen often and has fractured some cervical vertebrae in 11/2019.  he was last seen in 03/2020.  He returns for f/u.        Past Medical History:  Diagnosis Date  . 1st degree AV block   . Arthritis   . Asthma   . BARRETTS ESOPHAGUS 06/02/2007  . Benign prostatic hypertrophy   . Bladder neck obstruction 09/29/2010  . CAD (coronary artery disease)    a. CABG x 6 in 07/15/90. b. s/p DES x 2 to SVG -> OM1/OM2 in 09/2010. c. s/p DES to mLAD 10/2010. d. 12/2011 NSTEMI (off DAPT) s/p PTCA of ISR of DES in SVG-OM,  Recs for lifelong DAPT   . Carotid artery disease ([place])    Doppler, May, 2014, 6-23% R. ICA, 76-28% LICA, with recent syncope patient sent for consult with vascular surgery  . Carotid bruit    April, 2014  . Cervical disc disease   . Concussion July 09, 2012   Scalp  . Depression   . Diabetes mellitus    type II-diet  . Diverticulosis   . Echocardiogram    Echo 09/2018: EF 60-65, Gr 1 DD, mild AoV sclerosis, trivial TR  . Ejection fraction    EF 60%, echo, June, 2012  . Fall at home July 09, 2012  . GERD (gastroesophageal reflux disease)   . Hearing loss 2014  . Hx of CABG    1992  . Hyperlipidemia    intolerance to Crestor Zocor Vytorin. He tolerates Pravachol...low HDL  . Paresthesias    successful surgery by Dr. Earnie Larsson  . Prostatitis   . Rosacea   . Sinus  bradycardia    asymptomatic   . SKIN LESION 06/02/2007  . Squamous cell carcinoma of skin 08/22/2019   moderately differentiated on right buccal cheek(excision) margins free  . Subdural hematoma ([facility])    Subdural hematoma secondary to falling off a ladder  April, 2014,  medical therapy,  subdural did not expand while blood levels of ddual antiplatelet therapy decreased over one week.  Episode of chest pain in the hospital,, dual antiplatelet therapy restarted approximately one week after it was held  . Syncope    The patient lost consciousness and hit his head July 09 2012, not clear yet if he had true syncope    Current Medications: No outpatient medications have been marked as taking for the 07/02/20 encounter (Appointment) with Richardson Dopp T, PA-C.     Allergies:   Atenolol, Rosuvastatin, Simvastatin, Ultram [tramadol], Zofran [ondansetron hcl], Adhesive [tape], Codeine, and Ezetimibe-simvastatin   Social History   Tobacco Use  . Smoking status: Former Smoker    Types: Cigarettes    Start date: 01/03/1955  . Smokeless tobacco: Never Used  Vaping Use  . Vaping Use: Never used  Substance Use Topics  . Alcohol use: Not Currently    Comment: occ  . Drug use: No     Family Hx: The patient's family history includes Aneurysm in an other family member; Diabetes in his mother, sister, and son; Heart attack in his father; Heart disease in his father and mother; Hyperlipidemia in his son and another family member; Hypertension in his son and son; Kidney disease in his sister; Liver cancer in an other family member.  ROS   EKGs/Labs/Other Test Reviewed:    EKG:  EKG is *** ordered today.  The ekg ordered today demonstrates ***  Recent Labs: 03/31/2020: NT-Pro BNP 124 05/05/2020: ALT 22; B Natriuretic Peptide 41.9; BUN 17; Creatinine, Ser 1.14; Hemoglobin 13.7; Platelets 174; Potassium 4.6; Sodium 135; TSH 3.196   Recent Lipid Panel Lab Results  Component Value Date/Time   CHOL  144 12/25/2018 04:50 PM   TRIG 152.0 (H) 12/25/2018 04:50 PM   HDL 36.40 (L) 12/25/2018 04:50 PM   CHOLHDL 4 12/25/2018 04:50 PM   LDLCALC 77 12/25/2018 04:50 PM   LDLDIRECT 87.6 09/19/2008 03:46 PM      Risk Assessment/Calculations:   {Does this patient have ATRIAL FIBRILLATION?[phone]}  Physical Exam:    VS:  There were no vitals taken for this visit.    Wt Readings from Last 3 Encounters:  05/05/20 185 lb (  83.9 kg)  03/31/20 184 lb (83.5 kg)  01/30/20 176 lb (79.8 kg)     Physical Exam ***  ASSESSMENT & PLAN:    *** 1. Leg edema 2. Shortness of breath Etiology not entirely clear.  He does have bilateral leg edema.  This may improve with elevation.  History is somewhat difficult given his dementia.  He did lose a significant amount of weight recently.  Hypoalbuminemia may be playing a role.  He is no longer on amlodipine.  He may be somewhat short of breath at times.  He does not have rales on exam and he does not have symptoms of orthopnea.  Question if this may all be related to venous insufficiency.  I think compression stockings, elevation will help quite significantly.  I will obtain labs today to include CMET, CBC, TSH and BNP.  If his BNP is significantly elevated, I will obtain an echocardiogram and place him on regular furosemide.  Otherwise, I have given him a prescription for furosemide 20 mg to take once daily as needed for significant swelling.  Follow-up in 3 months.  He knows to return sooner if his swelling or shortness of breath worsen.  3. Coronary artery disease involving native coronary artery of native heart without angina pectoris Hx of CABG in 1992, stenting to the S-OM and LAD beyond the LIMA in 2012 and NSTEMI 2/2 stent restenosis in the S-OM which was tx with POBA.  He is doing well without anginal symptoms.  Continue aspirin, clopidogrel, pravastatin.  4. Essential hypertension The patient's blood pressure is controlled on his current regimen.   Continue current therapy.   5. Mixed hyperlipidemia LDL optimal on most recent lab work.  Continue current Rx.    6. Bradycardia Resolved after stopping beta-blocker.  He does have first-degree block on electrocardiogram.  None of his falls seem be related to syncope.  However, if he does have symptoms that sound concerning for syncope or near syncope in the future, we will need to place him on a monitor to rule out significant pauses.  7. Bilateral carotid artery stenosis Followed by vascular surgery. {Are you ordering a CV Procedure (e.g. stress test, cath, DCCV, TEE, etc)?   Press F2        :[id number]}    Dispo:  No follow-ups on file.   Medication Adjustments/Labs and Tests Ordered: Current medicines are reviewed at length with the patient today.  Concerns regarding medicines are outlined above.  Tests Ordered: No orders of the defined types were placed in this encounter.  Medication Changes: No orders of the defined types were placed in this encounter.   Signed, Richardson Dopp, PA-C  07/01/2020 10:48 PM    [facility] [place], [place], [place]  [address] Phone: [phone]; Fax: [phone]

## 2020-07-02 ENCOUNTER — Ambulatory Visit: Payer: Medicare PPO | Admitting: Physician Assistant

## 2020-07-02 DIAGNOSIS — R001 Bradycardia, unspecified: Secondary | ICD-10-CM

## 2020-07-02 DIAGNOSIS — R600 Localized edema: Secondary | ICD-10-CM

## 2020-07-02 DIAGNOSIS — I2510 Atherosclerotic heart disease of native coronary artery without angina pectoris: Secondary | ICD-10-CM

## 2020-07-02 DIAGNOSIS — I10 Essential (primary) hypertension: Secondary | ICD-10-CM

## 2020-07-07 DIAGNOSIS — Z20828 Contact with and (suspected) exposure to other viral communicable diseases: Secondary | ICD-10-CM | POA: Diagnosis not present

## 2020-07-08 DIAGNOSIS — R262 Difficulty in walking, not elsewhere classified: Secondary | ICD-10-CM | POA: Diagnosis not present

## 2020-07-08 DIAGNOSIS — M6281 Muscle weakness (generalized): Secondary | ICD-10-CM | POA: Diagnosis not present

## 2020-07-08 DIAGNOSIS — R296 Repeated falls: Secondary | ICD-10-CM | POA: Diagnosis not present

## 2020-07-08 DIAGNOSIS — R41841 Cognitive communication deficit: Secondary | ICD-10-CM | POA: Diagnosis not present

## 2020-07-08 DIAGNOSIS — R488 Other symbolic dysfunctions: Secondary | ICD-10-CM | POA: Diagnosis not present

## 2020-07-08 DIAGNOSIS — R279 Unspecified lack of coordination: Secondary | ICD-10-CM | POA: Diagnosis not present

## 2020-07-09 DIAGNOSIS — R279 Unspecified lack of coordination: Secondary | ICD-10-CM | POA: Diagnosis not present

## 2020-07-09 DIAGNOSIS — R262 Difficulty in walking, not elsewhere classified: Secondary | ICD-10-CM | POA: Diagnosis not present

## 2020-07-09 DIAGNOSIS — M6281 Muscle weakness (generalized): Secondary | ICD-10-CM | POA: Diagnosis not present

## 2020-07-09 DIAGNOSIS — R296 Repeated falls: Secondary | ICD-10-CM | POA: Diagnosis not present

## 2020-07-10 DIAGNOSIS — R262 Difficulty in walking, not elsewhere classified: Secondary | ICD-10-CM | POA: Diagnosis not present

## 2020-07-10 DIAGNOSIS — R279 Unspecified lack of coordination: Secondary | ICD-10-CM | POA: Diagnosis not present

## 2020-07-10 DIAGNOSIS — M6281 Muscle weakness (generalized): Secondary | ICD-10-CM | POA: Diagnosis not present

## 2020-07-10 DIAGNOSIS — R296 Repeated falls: Secondary | ICD-10-CM | POA: Diagnosis not present

## 2020-07-11 DIAGNOSIS — R279 Unspecified lack of coordination: Secondary | ICD-10-CM | POA: Diagnosis not present

## 2020-07-11 DIAGNOSIS — R262 Difficulty in walking, not elsewhere classified: Secondary | ICD-10-CM | POA: Diagnosis not present

## 2020-07-11 DIAGNOSIS — M6281 Muscle weakness (generalized): Secondary | ICD-10-CM | POA: Diagnosis not present

## 2020-07-11 DIAGNOSIS — R488 Other symbolic dysfunctions: Secondary | ICD-10-CM | POA: Diagnosis not present

## 2020-07-11 DIAGNOSIS — R296 Repeated falls: Secondary | ICD-10-CM | POA: Diagnosis not present

## 2020-07-11 DIAGNOSIS — R41841 Cognitive communication deficit: Secondary | ICD-10-CM | POA: Diagnosis not present

## 2020-07-14 DIAGNOSIS — R279 Unspecified lack of coordination: Secondary | ICD-10-CM | POA: Diagnosis not present

## 2020-07-14 DIAGNOSIS — R262 Difficulty in walking, not elsewhere classified: Secondary | ICD-10-CM | POA: Diagnosis not present

## 2020-07-14 DIAGNOSIS — M6281 Muscle weakness (generalized): Secondary | ICD-10-CM | POA: Diagnosis not present

## 2020-07-15 DIAGNOSIS — R296 Repeated falls: Secondary | ICD-10-CM | POA: Diagnosis not present

## 2020-07-15 DIAGNOSIS — R41841 Cognitive communication deficit: Secondary | ICD-10-CM | POA: Diagnosis not present

## 2020-07-15 DIAGNOSIS — M6281 Muscle weakness (generalized): Secondary | ICD-10-CM | POA: Diagnosis not present

## 2020-07-15 DIAGNOSIS — R488 Other symbolic dysfunctions: Secondary | ICD-10-CM | POA: Diagnosis not present

## 2020-07-16 DIAGNOSIS — R262 Difficulty in walking, not elsewhere classified: Secondary | ICD-10-CM | POA: Diagnosis not present

## 2020-07-16 DIAGNOSIS — R279 Unspecified lack of coordination: Secondary | ICD-10-CM | POA: Diagnosis not present

## 2020-07-16 DIAGNOSIS — M6281 Muscle weakness (generalized): Secondary | ICD-10-CM | POA: Diagnosis not present

## 2020-07-17 DIAGNOSIS — R488 Other symbolic dysfunctions: Secondary | ICD-10-CM | POA: Diagnosis not present

## 2020-07-17 DIAGNOSIS — R296 Repeated falls: Secondary | ICD-10-CM | POA: Diagnosis not present

## 2020-07-17 DIAGNOSIS — M6281 Muscle weakness (generalized): Secondary | ICD-10-CM | POA: Diagnosis not present

## 2020-07-17 DIAGNOSIS — R41841 Cognitive communication deficit: Secondary | ICD-10-CM | POA: Diagnosis not present

## 2020-07-18 DIAGNOSIS — R296 Repeated falls: Secondary | ICD-10-CM | POA: Diagnosis not present

## 2020-07-18 DIAGNOSIS — M6281 Muscle weakness (generalized): Secondary | ICD-10-CM | POA: Diagnosis not present

## 2020-07-18 DIAGNOSIS — R279 Unspecified lack of coordination: Secondary | ICD-10-CM | POA: Diagnosis not present

## 2020-07-18 DIAGNOSIS — R262 Difficulty in walking, not elsewhere classified: Secondary | ICD-10-CM | POA: Diagnosis not present

## 2020-07-21 DIAGNOSIS — Z20828 Contact with and (suspected) exposure to other viral communicable diseases: Secondary | ICD-10-CM | POA: Diagnosis not present

## 2020-07-21 DIAGNOSIS — R296 Repeated falls: Secondary | ICD-10-CM | POA: Diagnosis not present

## 2020-07-21 DIAGNOSIS — M6281 Muscle weakness (generalized): Secondary | ICD-10-CM | POA: Diagnosis not present

## 2020-07-22 DIAGNOSIS — R279 Unspecified lack of coordination: Secondary | ICD-10-CM | POA: Diagnosis not present

## 2020-07-22 DIAGNOSIS — R488 Other symbolic dysfunctions: Secondary | ICD-10-CM | POA: Diagnosis not present

## 2020-07-22 DIAGNOSIS — R262 Difficulty in walking, not elsewhere classified: Secondary | ICD-10-CM | POA: Diagnosis not present

## 2020-07-22 DIAGNOSIS — R41841 Cognitive communication deficit: Secondary | ICD-10-CM | POA: Diagnosis not present

## 2020-07-22 DIAGNOSIS — M6281 Muscle weakness (generalized): Secondary | ICD-10-CM | POA: Diagnosis not present

## 2020-07-23 DIAGNOSIS — R41841 Cognitive communication deficit: Secondary | ICD-10-CM | POA: Diagnosis not present

## 2020-07-23 DIAGNOSIS — R488 Other symbolic dysfunctions: Secondary | ICD-10-CM | POA: Diagnosis not present

## 2020-07-23 DIAGNOSIS — R262 Difficulty in walking, not elsewhere classified: Secondary | ICD-10-CM | POA: Diagnosis not present

## 2020-07-23 DIAGNOSIS — R279 Unspecified lack of coordination: Secondary | ICD-10-CM | POA: Diagnosis not present

## 2020-07-23 DIAGNOSIS — M6281 Muscle weakness (generalized): Secondary | ICD-10-CM | POA: Diagnosis not present

## 2020-07-24 DIAGNOSIS — R296 Repeated falls: Secondary | ICD-10-CM | POA: Diagnosis not present

## 2020-07-24 DIAGNOSIS — M6281 Muscle weakness (generalized): Secondary | ICD-10-CM | POA: Diagnosis not present

## 2020-07-25 DIAGNOSIS — M6281 Muscle weakness (generalized): Secondary | ICD-10-CM | POA: Diagnosis not present

## 2020-07-25 DIAGNOSIS — R279 Unspecified lack of coordination: Secondary | ICD-10-CM | POA: Diagnosis not present

## 2020-07-25 DIAGNOSIS — R262 Difficulty in walking, not elsewhere classified: Secondary | ICD-10-CM | POA: Diagnosis not present

## 2020-07-25 DIAGNOSIS — R296 Repeated falls: Secondary | ICD-10-CM | POA: Diagnosis not present

## 2020-07-28 DIAGNOSIS — R262 Difficulty in walking, not elsewhere classified: Secondary | ICD-10-CM | POA: Diagnosis not present

## 2020-07-28 DIAGNOSIS — R296 Repeated falls: Secondary | ICD-10-CM | POA: Diagnosis not present

## 2020-07-28 DIAGNOSIS — M6281 Muscle weakness (generalized): Secondary | ICD-10-CM | POA: Diagnosis not present

## 2020-07-28 DIAGNOSIS — Z20828 Contact with and (suspected) exposure to other viral communicable diseases: Secondary | ICD-10-CM | POA: Diagnosis not present

## 2020-07-28 DIAGNOSIS — R279 Unspecified lack of coordination: Secondary | ICD-10-CM | POA: Diagnosis not present

## 2020-07-29 DIAGNOSIS — M6281 Muscle weakness (generalized): Secondary | ICD-10-CM | POA: Diagnosis not present

## 2020-07-29 DIAGNOSIS — R279 Unspecified lack of coordination: Secondary | ICD-10-CM | POA: Diagnosis not present

## 2020-07-29 DIAGNOSIS — R262 Difficulty in walking, not elsewhere classified: Secondary | ICD-10-CM | POA: Diagnosis not present

## 2020-07-29 DIAGNOSIS — R296 Repeated falls: Secondary | ICD-10-CM | POA: Diagnosis not present

## 2020-07-30 ENCOUNTER — Other Ambulatory Visit: Payer: Self-pay

## 2020-07-30 ENCOUNTER — Encounter: Payer: Self-pay | Admitting: Internal Medicine

## 2020-07-30 ENCOUNTER — Ambulatory Visit (INDEPENDENT_AMBULATORY_CARE_PROVIDER_SITE_OTHER): Payer: Medicare PPO | Admitting: Internal Medicine

## 2020-07-30 VITALS — BP 112/70 | HR 62 | Ht 72.0 in | Wt 181.0 lb

## 2020-07-30 DIAGNOSIS — I10 Essential (primary) hypertension: Secondary | ICD-10-CM | POA: Diagnosis not present

## 2020-07-30 DIAGNOSIS — E1165 Type 2 diabetes mellitus with hyperglycemia: Secondary | ICD-10-CM

## 2020-07-30 DIAGNOSIS — R296 Repeated falls: Secondary | ICD-10-CM | POA: Diagnosis not present

## 2020-07-30 DIAGNOSIS — M6281 Muscle weakness (generalized): Secondary | ICD-10-CM | POA: Diagnosis not present

## 2020-07-30 DIAGNOSIS — Z0000 Encounter for general adult medical examination without abnormal findings: Secondary | ICD-10-CM | POA: Diagnosis not present

## 2020-07-30 NOTE — Patient Instructions (Signed)
Your A1c test was OK today  Please continue all other medications as before, and refills have been done if requested.  Please have the pharmacy call with any other refills you may need.  Please continue your efforts at being more active, low cholesterol diet, and weight control.  You are otherwise up to date with prevention measures today.  Please keep your appointments with your specialists as you may have planned  Please make an Appointment to return in 6 months, or sooner if needed

## 2020-07-30 NOTE — Progress Notes (Signed)
Patient ID: Sean Lozano, male   DOB: May 10, 1933, 87 y.o.   MRN: [id number]         Chief Complaint:: wellness exam and Follow-up  DM, htn       HPI:  Sean Lozano is a 87 y.o. male here for wellness exam; son states will work on yearly eye exam soon, o/w up to date with preventive referrals and immunizations                        Also has fallen several time with not using walker, tends to forget, now resides in memory care, currently working with PT after finishing ST and OT.  Now with ensure 1 can per day with recent wt loss.  Pt denies chest pain, increased sob or doe, wheezing, orthopnea, PND, increased LE swelling, palpitations, dizziness or syncope.   Pt denies polydipsia, polyuria  Wt Readings from Last 3 Encounters:  07/30/20 181 lb (82.1 kg)  05/05/20 185 lb (83.9 kg)  03/31/20 184 lb (83.5 kg)   BP Readings from Last 3 Encounters:  07/30/20 112/70  05/05/20 (!) 156/86  03/31/20 (!) 102/58   Immunization History  Administered Date(s) Administered  . Fluad Quad(high Dose 65+) 12/25/2018  . Influenza, High Dose Seasonal PF 05/10/2014, 05/30/2015, 12/02/2016  . Influenza,inj,Quad PF,6+ Mos 04/19/2013  . Influenza-Unspecified 05/13/2014, 12/02/2016, 01/10/2018, 12/24/2019  . Moderna Sars-Covid-2 Vaccination 04/30/2019, 05/28/2019, 02/07/2020  . Pneumococcal Conjugate-13 11/20/2013  . Pneumococcal Polysaccharide-23 04/15/2006  . Td 09/19/2008  . Tdap 12/25/2018  . Zoster 12/03/2013   Health Maintenance Due  Topic Date Due  . COVID-19 Vaccine (4 - Booster for Moderna series) 08/07/2020      Past Medical History:  Diagnosis Date  . 1st degree AV block   . Arthritis   . Asthma   . BARRETTS ESOPHAGUS 06/02/2007  . Benign prostatic hypertrophy   . Bladder neck obstruction 09/29/2010  . CAD (coronary artery disease)    a. CABG x 6 in 1992. b. s/p DES x 2 to SVG -> OM1/OM2 in 09/2010. c. s/p DES to mLAD 10/2010. d. 12/2011 NSTEMI (off DAPT) s/p PTCA of ISR of DES in  SVG-OM, Recs for lifelong DAPT   . Carotid artery disease ([place])    Doppler, May, 2014, 0-26% R. ICA, 37-85% LICA, with recent syncope patient sent for consult with vascular surgery  . Carotid bruit    April, 2014  . Cervical disc disease   . Concussion July 09, 2012   Scalp  . Depression   . Diabetes mellitus    type II-diet  . Diverticulosis   . Echocardiogram    Echo 09/2018: EF 60-65, Gr 1 DD, mild AoV sclerosis, trivial TR  . Ejection fraction    EF 60%, echo, June, 2012  . Fall at home July 09, 2012  . GERD (gastroesophageal reflux disease)   . Hearing loss 2014  . Hx of CABG    1992  . Hyperlipidemia    intolerance to Crestor Zocor Vytorin. He tolerates Pravachol...low HDL  . Paresthesias    successful surgery by Dr. Earnie Larsson  . Prostatitis   . Rosacea   . Sinus bradycardia    asymptomatic   . SKIN LESION 06/02/2007  . Squamous cell carcinoma of skin 08/22/2019   moderately differentiated on right buccal cheek(excision) margins free  . Subdural hematoma (HCC)    Subdural hematoma secondary to falling off a ladder  April, 2014,  medical therapy,  subdural did not expand while blood levels of ddual antiplatelet therapy decreased over one week.  Episode of chest pain in the hospital,, dual antiplatelet therapy restarted approximately one week after it was held  . Syncope    The patient lost consciousness and hit his head July 09 2012, not clear yet if he had true syncope   Past Surgical History:  Procedure Laterality Date  . APPENDECTOMY    . Barretts Esophagus   20/20/09  . Bladder neck obstruction  09/29/10  . C-spine disc surgery  03/2006  . C4-5 surgery  01/2009  . CARDIAC CATHETERIZATION  July 2012   stent placed  X's  4  . COCCYX FRACTURE SURGERY  1997  . CORONARY ARTERY BYPASS GRAFT  1992  . ENDARTERECTOMY Left 09/07/2012   Procedure: ENDARTERECTOMY CAROTID with patch angioplasty;  Surgeon: Mal Misty, MD;  Location: [facility];  Service: Vascular;   Laterality: Left;  . LEFT HEART CATHETERIZATION WITH CORONARY/GRAFT ANGIOGRAM N/A 12/21/2011   Procedure: LEFT HEART CATHETERIZATION WITH Beatrix Fetters;  Surgeon: Hillary Bow, MD;  Location[facility];  Service: Cardiovascular;  Laterality: N/A;  . TONSILLECTOMY      reports that he has quit smoking. His smoking use included cigarettes. He started smoking about 65 years ago. He has never used smokeless tobacco. He reports previous alcohol use. He reports that he does not use drugs. family history includes Aneurysm in an other family member; Diabetes in his mother, sister, and son; Heart attack in his father; Heart disease in his father and mother; Hyperlipidemia in his son and another family member; Hypertension in his son and son; Kidney disease in his sister; Liver cancer in an other family member. Allergies  Allergen Reactions  . Atenolol Other (See Comments)    depression  . Rosuvastatin Other (See Comments)    Severe hip pain  . Simvastatin Other (See Comments)    Severe hip pain  . Ultram [Tramadol] Nausea And Vomiting  . Zofran [Ondansetron Hcl] Nausea Only and Other (See Comments)    Severe nausea and constipation  . Adhesive [Tape] Other (See Comments)    Breakdown skin  . Codeine Other (See Comments)    Crazy feeling  . Ezetimibe-Simvastatin Other (See Comments)    Severe hip pain   Current Outpatient Medications on File Prior to Visit  Medication Sig Dispense Refill  . aspirin 81 MG chewable tablet Chew 81 mg by mouth at bedtime.    . Calcium Carb-Cholecalciferol (CALCIUM CARBONATE-VITAMIN D3) 600-400 MG-UNIT TABS Take 1 tablet by mouth daily.    . clopidogrel (PLAVIX) 75 MG tablet TAKE 1 TABLET BY MOUTH EVERY DAY 90 tablet 3  . Ensure Plus (ENSURE PLUS) LIQD Take 1 Can by mouth 2 (two) times daily between meals.    . Multiple Vitamins-Minerals (CERTAVITE SENIOR) TABS Take 1 tablet by mouth daily.    . nitroGLYCERIN (NITROSTAT) 0.4 MG SL tablet Place 1  tablet (0.4 mg total) under the tongue every 5 (five) minutes as needed for chest pain. 75 tablet 2  . polyethylene glycol (MIRALAX / GLYCOLAX) 17 g packet Take 17 g by mouth daily as needed (constipation).    . pravastatin (PRAVACHOL) 40 MG tablet TAKE 1 TABLET BY MOUTH EVERY DAY IN THE EVENING 90 tablet 2  . sertraline (ZOLOFT) 50 MG tablet 50 mg 2 (two) times daily. Take 1 1/2 tablet by mouth daily (75 mg total)    . tamsulosin (FLOMAX) 0.4 MG CAPS capsule TAKE 1  CAPSULE (0.4 MG TOTAL) BY MOUTH DAILY AFTER SUPPER. 90 capsule 1  . acetaminophen (TYLENOL) 500 MG tablet Take 500 mg by mouth every 6 (six) hours as needed (pain or temp greater than 100.5). (Patient not taking: Reported on 07/30/2020)    . furosemide (LASIX) 20 MG tablet Take 1 tablet (20 mg total) by mouth as needed for fluid or edema (significant edema). (Patient not taking: Reported on 07/30/2020) 30 tablet 1  . Menthol, Topical Analgesic, (BIOFREEZE) 4 % GEL Apply 1 application topically 4 (four) times daily as needed (knee pain). (Patient not taking: Reported on 07/30/2020)     No current facility-administered medications on file prior to visit.        ROS:  All others reviewed and negative.  Objective        PE:  BP 112/70 (BP Location: Left Arm, Patient Position: Sitting, Cuff Size: Large)   Pulse 62   Ht 6' (1.829 m)   Wt 181 lb (82.1 kg)   SpO2 95%   BMI 24.55 kg/m                 Constitutional: Pt appears in NAD               HENT: Head: NCAT.                Right Ear: External ear normal.                 Left Ear: External ear normal.                Eyes: . Pupils are equal, round, and reactive to light. Conjunctivae and EOM are normal               Nose: without d/c or deformity               Neck: Neck supple. Gross normal ROM               Cardiovascular: Normal rate and regular rhythm.                 Pulmonary/Chest: Effort normal and breath sounds without rales or wheezing.                Abd:  Soft, NT,  ND, + BS, no organomegaly               Neurological: Pt is alert. At baseline orientation, motor grossly intact               Skin: Skin is warm. No rashes, no other new lesions, LE edema - none               Psychiatric: Pt behavior is normal without agitation   Micro: none  Cardiac tracings I have personally interpreted today:  none  Pertinent Radiological findings (summarize): none   Lab Results  Component Value Date   WBC 9.6 05/05/2020   HGB 13.7 05/05/2020   HCT 42.6 05/05/2020   PLT 174 05/05/2020   GLUCOSE 101 (H) 05/05/2020   CHOL 144 12/25/2018   TRIG 152.0 (H) 12/25/2018   HDL 36.40 (L) 12/25/2018   LDLDIRECT 87.6 09/19/2008   LDLCALC 77 12/25/2018   ALT 22 05/05/2020   AST 32 05/05/2020   NA 135 05/05/2020   K 4.6 05/05/2020   CL 102 05/05/2020   CREATININE 1.14 05/05/2020   BUN 17 05/05/2020   CO2 27 05/05/2020   TSH 3.196 05/05/2020   PSA  1.47 11/20/2013   INR 1.04 12/02/2017   HGBA1C 6.7 (A) 06/25/2019   MICROALBUR 1.8 12/25/2018   Assessment/Plan:  ELDON ZIETLOW is a 87 y.o. White or Caucasian [1] male with  has a past medical history of 1st degree AV block, Arthritis, Asthma, BARRETTS ESOPHAGUS (06/02/2007), Benign prostatic hypertrophy, Bladder neck obstruction (09/29/2010), CAD (coronary artery disease), Carotid artery disease ([place]), Carotid bruit, Cervical disc disease, Concussion (July 09, 2012), Depression, Diabetes mellitus, Diverticulosis, Echocardiogram, Ejection fraction, Fall at home (July 09, 2012), GERD (gastroesophageal reflux disease), Hearing loss (2014), CABG, Hyperlipidemia, Paresthesias, Prostatitis, Rosacea, Sinus bradycardia, SKIN LESION (06/02/2007), Squamous cell carcinoma of skin (08/22/2019), Subdural hematoma ([place]), and Syncope.  Preventative health care Age and sex appropriate education and counseling updated with regular exercise and diet Referrals for preventative services - none needed Immunizations addressed - none  needed Smoking counseling  - none needed Evidence for depression or other mood disorder - none significant Most recent labs reviewed. I have personally reviewed and have noted: 1) the patient's medical and social history 2) The patient's current medications and supplements 3) The patient's height, weight, and BMI have been recorded in the chart   Essential hypertension BP Readings from Last 3 Encounters:  07/30/20 112/70  05/05/20 (!) 156/86  03/31/20 (!) 102/58   Stable, pt to continue medical treatment - lasix   Diabetes ([place]) Lab Results  Component Value Date   HGBA1C 6.7 (A) 06/25/2019   Stable, pt to continue current medical treatment  - diet   Followup: Return in about 6 months (around 01/29/2021).  Cathlean Cower, MD 08/03/2020 6:02 PM [facility];;

## 2020-07-31 DIAGNOSIS — R262 Difficulty in walking, not elsewhere classified: Secondary | ICD-10-CM | POA: Diagnosis not present

## 2020-07-31 DIAGNOSIS — R279 Unspecified lack of coordination: Secondary | ICD-10-CM | POA: Diagnosis not present

## 2020-07-31 DIAGNOSIS — M6281 Muscle weakness (generalized): Secondary | ICD-10-CM | POA: Diagnosis not present

## 2020-08-03 ENCOUNTER — Encounter: Payer: Self-pay | Admitting: Internal Medicine

## 2020-08-03 DIAGNOSIS — I10 Essential (primary) hypertension: Secondary | ICD-10-CM | POA: Diagnosis not present

## 2020-08-03 DIAGNOSIS — R58 Hemorrhage, not elsewhere classified: Secondary | ICD-10-CM | POA: Diagnosis not present

## 2020-08-03 DIAGNOSIS — S0990XA Unspecified injury of head, initial encounter: Secondary | ICD-10-CM | POA: Diagnosis not present

## 2020-08-03 DIAGNOSIS — H547 Unspecified visual loss: Secondary | ICD-10-CM | POA: Diagnosis not present

## 2020-08-03 DIAGNOSIS — R000 Tachycardia, unspecified: Secondary | ICD-10-CM | POA: Diagnosis not present

## 2020-08-03 NOTE — Assessment & Plan Note (Signed)

## 2020-08-03 NOTE — Assessment & Plan Note (Signed)
Lab Results  Component Value Date   HGBA1C 6.7 (A) 06/25/2019   Stable, pt to continue current medical treatment  - diet

## 2020-08-03 NOTE — Assessment & Plan Note (Signed)
BP Readings from Last 3 Encounters:  07/30/20 112/70  05/05/20 (!) 156/86  03/31/20 (!) 102/58   Stable, pt to continue medical treatment - lasix

## 2020-08-04 ENCOUNTER — Inpatient Hospital Stay (HOSPITAL_COMMUNITY)
Admission: EM | Admit: 2020-08-04 | Discharge: 2020-08-06 | DRG: 084 | Disposition: A | Discharge status: Skilled Nursing Facility | Payer: Medicare PPO | Source: Skilled Nursing Facility | Attending: General Surgery | Admitting: General Surgery

## 2020-08-04 ENCOUNTER — Encounter (HOSPITAL_COMMUNITY): Payer: Self-pay

## 2020-08-04 ENCOUNTER — Emergency Department (HOSPITAL_COMMUNITY): Payer: Medicare PPO

## 2020-08-04 ENCOUNTER — Other Ambulatory Visit: Payer: Self-pay

## 2020-08-04 ENCOUNTER — Emergency Department (HOSPITAL_COMMUNITY)
Admission: EM | Admit: 2020-08-04 | Discharge: 2020-08-04 | Disposition: A | Discharge status: Skilled Nursing Facility | Payer: Medicare PPO | Source: Home / Self Care | Attending: Emergency Medicine | Admitting: Emergency Medicine

## 2020-08-04 DIAGNOSIS — S300XXA Contusion of lower back and pelvis, initial encounter: Secondary | ICD-10-CM

## 2020-08-04 DIAGNOSIS — S322XXA Fracture of coccyx, initial encounter for closed fracture: Secondary | ICD-10-CM

## 2020-08-04 DIAGNOSIS — S065X9A Traumatic subdural hemorrhage with loss of consciousness of unspecified duration, initial encounter: Secondary | ICD-10-CM | POA: Diagnosis present

## 2020-08-04 DIAGNOSIS — S0101XA Laceration without foreign body of scalp, initial encounter: Secondary | ICD-10-CM

## 2020-08-04 DIAGNOSIS — W19XXXA Unspecified fall, initial encounter: Principal | ICD-10-CM

## 2020-08-04 DIAGNOSIS — S32040A Wedge compression fracture of fourth lumbar vertebra, initial encounter for closed fracture: Secondary | ICD-10-CM

## 2020-08-04 DIAGNOSIS — S0191XA Laceration without foreign body of unspecified part of head, initial encounter: Secondary | ICD-10-CM | POA: Insufficient documentation

## 2020-08-04 DIAGNOSIS — Z7982 Long term (current) use of aspirin: Secondary | ICD-10-CM

## 2020-08-04 DIAGNOSIS — I708 Atherosclerosis of other arteries: Secondary | ICD-10-CM | POA: Diagnosis not present

## 2020-08-04 DIAGNOSIS — S0990XA Unspecified injury of head, initial encounter: Secondary | ICD-10-CM

## 2020-08-04 DIAGNOSIS — Z7902 Long term (current) use of antithrombotics/antiplatelets: Secondary | ICD-10-CM | POA: Diagnosis not present

## 2020-08-04 DIAGNOSIS — K219 Gastro-esophageal reflux disease without esophagitis: Secondary | ICD-10-CM | POA: Diagnosis present

## 2020-08-04 DIAGNOSIS — Z955 Presence of coronary angioplasty implant and graft: Secondary | ICD-10-CM | POA: Diagnosis not present

## 2020-08-04 DIAGNOSIS — S32049A Unspecified fracture of fourth lumbar vertebra, initial encounter for closed fracture: Secondary | ICD-10-CM | POA: Diagnosis not present

## 2020-08-04 DIAGNOSIS — Z20822 Contact with and (suspected) exposure to covid-19: Secondary | ICD-10-CM | POA: Diagnosis present

## 2020-08-04 DIAGNOSIS — Z885 Allergy status to narcotic agent status: Secondary | ICD-10-CM

## 2020-08-04 DIAGNOSIS — E785 Hyperlipidemia, unspecified: Secondary | ICD-10-CM | POA: Diagnosis present

## 2020-08-04 DIAGNOSIS — W1830XA Fall on same level, unspecified, initial encounter: Secondary | ICD-10-CM | POA: Diagnosis present

## 2020-08-04 DIAGNOSIS — I10 Essential (primary) hypertension: Secondary | ICD-10-CM | POA: Diagnosis present

## 2020-08-04 DIAGNOSIS — Z79899 Other long term (current) drug therapy: Secondary | ICD-10-CM | POA: Diagnosis not present

## 2020-08-04 DIAGNOSIS — N400 Enlarged prostate without lower urinary tract symptoms: Secondary | ICD-10-CM | POA: Diagnosis present

## 2020-08-04 DIAGNOSIS — Z66 Do not resuscitate: Secondary | ICD-10-CM | POA: Diagnosis present

## 2020-08-04 DIAGNOSIS — Z7189 Other specified counseling: Secondary | ICD-10-CM | POA: Diagnosis not present

## 2020-08-04 DIAGNOSIS — R600 Localized edema: Secondary | ICD-10-CM | POA: Diagnosis present

## 2020-08-04 DIAGNOSIS — F05 Delirium due to known physiological condition: Secondary | ICD-10-CM | POA: Diagnosis present

## 2020-08-04 DIAGNOSIS — R451 Restlessness and agitation: Secondary | ICD-10-CM | POA: Diagnosis not present

## 2020-08-04 DIAGNOSIS — M2578 Osteophyte, vertebrae: Secondary | ICD-10-CM | POA: Diagnosis not present

## 2020-08-04 DIAGNOSIS — M25552 Pain in left hip: Secondary | ICD-10-CM | POA: Insufficient documentation

## 2020-08-04 DIAGNOSIS — R279 Unspecified lack of coordination: Secondary | ICD-10-CM | POA: Diagnosis not present

## 2020-08-04 DIAGNOSIS — M25551 Pain in right hip: Secondary | ICD-10-CM | POA: Insufficient documentation

## 2020-08-04 DIAGNOSIS — I6782 Cerebral ischemia: Secondary | ICD-10-CM | POA: Diagnosis not present

## 2020-08-04 DIAGNOSIS — W01198A Fall on same level from slipping, tripping and stumbling with subsequent striking against other object, initial encounter: Secondary | ICD-10-CM | POA: Insufficient documentation

## 2020-08-04 DIAGNOSIS — Z515 Encounter for palliative care: Secondary | ICD-10-CM | POA: Diagnosis not present

## 2020-08-04 DIAGNOSIS — R402421 Glasgow coma scale score 9-12, in the field [EMT or ambulance]: Secondary | ICD-10-CM | POA: Diagnosis not present

## 2020-08-04 DIAGNOSIS — I959 Hypotension, unspecified: Secondary | ICD-10-CM | POA: Insufficient documentation

## 2020-08-04 DIAGNOSIS — I2510 Atherosclerotic heart disease of native coronary artery without angina pectoris: Secondary | ICD-10-CM | POA: Diagnosis present

## 2020-08-04 DIAGNOSIS — Z743 Need for continuous supervision: Secondary | ICD-10-CM | POA: Diagnosis not present

## 2020-08-04 DIAGNOSIS — G9389 Other specified disorders of brain: Secondary | ICD-10-CM | POA: Diagnosis not present

## 2020-08-04 DIAGNOSIS — R52 Pain, unspecified: Secondary | ICD-10-CM

## 2020-08-04 DIAGNOSIS — R58 Hemorrhage, not elsewhere classified: Secondary | ICD-10-CM | POA: Diagnosis not present

## 2020-08-04 DIAGNOSIS — F0390 Unspecified dementia without behavioral disturbance: Secondary | ICD-10-CM | POA: Diagnosis present

## 2020-08-04 DIAGNOSIS — R296 Repeated falls: Secondary | ICD-10-CM | POA: Diagnosis present

## 2020-08-04 DIAGNOSIS — I672 Cerebral atherosclerosis: Secondary | ICD-10-CM | POA: Diagnosis not present

## 2020-08-04 DIAGNOSIS — S161XXA Strain of muscle, fascia and tendon at neck level, initial encounter: Secondary | ICD-10-CM

## 2020-08-04 DIAGNOSIS — M4802 Spinal stenosis, cervical region: Secondary | ICD-10-CM | POA: Diagnosis not present

## 2020-08-04 DIAGNOSIS — Z888 Allergy status to other drugs, medicaments and biological substances status: Secondary | ICD-10-CM

## 2020-08-04 DIAGNOSIS — Z9181 History of falling: Secondary | ICD-10-CM | POA: Diagnosis not present

## 2020-08-04 DIAGNOSIS — S065XAA Traumatic subdural hemorrhage with loss of consciousness status unknown, initial encounter: Secondary | ICD-10-CM | POA: Diagnosis present

## 2020-08-04 DIAGNOSIS — K8020 Calculus of gallbladder without cholecystitis without obstruction: Secondary | ICD-10-CM | POA: Diagnosis not present

## 2020-08-04 DIAGNOSIS — S065X0A Traumatic subdural hemorrhage without loss of consciousness, initial encounter: Secondary | ICD-10-CM | POA: Diagnosis not present

## 2020-08-04 DIAGNOSIS — R0902 Hypoxemia: Secondary | ICD-10-CM | POA: Diagnosis not present

## 2020-08-04 DIAGNOSIS — R222 Localized swelling, mass and lump, trunk: Secondary | ICD-10-CM | POA: Diagnosis not present

## 2020-08-04 DIAGNOSIS — K429 Umbilical hernia without obstruction or gangrene: Secondary | ICD-10-CM | POA: Diagnosis present

## 2020-08-04 DIAGNOSIS — R55 Syncope and collapse: Secondary | ICD-10-CM | POA: Diagnosis not present

## 2020-08-04 DIAGNOSIS — G459 Transient cerebral ischemic attack, unspecified: Secondary | ICD-10-CM | POA: Diagnosis not present

## 2020-08-04 DIAGNOSIS — M47812 Spondylosis without myelopathy or radiculopathy, cervical region: Secondary | ICD-10-CM | POA: Diagnosis not present

## 2020-08-04 DIAGNOSIS — I6200 Nontraumatic subdural hemorrhage, unspecified: Secondary | ICD-10-CM | POA: Diagnosis not present

## 2020-08-04 DIAGNOSIS — Z789 Other specified health status: Secondary | ICD-10-CM | POA: Diagnosis not present

## 2020-08-04 HISTORY — DX: Hyperlipidemia, unspecified: E78.5

## 2020-08-04 HISTORY — DX: Atherosclerotic heart disease of native coronary artery without angina pectoris: I25.10

## 2020-08-04 HISTORY — DX: Gastro-esophageal reflux disease without esophagitis: K21.9

## 2020-08-04 HISTORY — DX: Unspecified dementia without behavioral disturbance: F03.90

## 2020-08-04 HISTORY — DX: Benign prostatic hyperplasia without lower urinary tract symptoms: N40.0

## 2020-08-04 HISTORY — DX: Repeated falls: R29.6

## 2020-08-04 HISTORY — DX: Essential (primary) hypertension: I10

## 2020-08-04 HISTORY — DX: Unspecified osteoarthritis, unspecified site: M19.90

## 2020-08-04 HISTORY — DX: Unspecified dementia, unspecified severity, without behavioral disturbance, psychotic disturbance, mood disturbance, and anxiety: F03.90

## 2020-08-04 LAB — CBC
HCT: 36.4 % — ABNORMAL LOW (ref 39.0–52.0)
Hemoglobin: 11.5 g/dL — ABNORMAL LOW (ref 13.0–17.0)
MCH: 34 pg (ref 26.0–34.0)
MCHC: 31.6 g/dL (ref 30.0–36.0)
MCV: 107.7 fL — ABNORMAL HIGH (ref 80.0–100.0)
Platelets: 153 10*3/uL (ref 150–400)
RBC: 3.38 MIL/uL — ABNORMAL LOW (ref 4.22–5.81)
RDW: 13.2 % (ref 11.5–15.5)
WBC: 10.9 10*3/uL — ABNORMAL HIGH (ref 4.0–10.5)
nRBC: 0 % (ref 0.0–0.2)

## 2020-08-04 LAB — CBC WITH DIFFERENTIAL/PLATELET
Abs Immature Granulocytes: 0.02 10*3/uL (ref 0.00–0.07)
Basophils Absolute: 0.1 10*3/uL (ref 0.0–0.1)
Basophils Relative: 1 %
Eosinophils Absolute: 0.3 10*3/uL (ref 0.0–0.5)
Eosinophils Relative: 2 %
HCT: 38.9 % — ABNORMAL LOW (ref 39.0–52.0)
Hemoglobin: 12.9 g/dL — ABNORMAL LOW (ref 13.0–17.0)
Immature Granulocytes: 0 %
Lymphocytes Relative: 28 %
Lymphs Abs: 3 10*3/uL (ref 0.7–4.0)
MCH: 33.7 pg (ref 26.0–34.0)
MCHC: 33.2 g/dL (ref 30.0–36.0)
MCV: 101.6 fL — ABNORMAL HIGH (ref 80.0–100.0)
Monocytes Absolute: 1 10*3/uL (ref 0.1–1.0)
Monocytes Relative: 9 %
Neutro Abs: 6.6 10*3/uL (ref 1.7–7.7)
Neutrophils Relative %: 60 %
Platelets: 163 10*3/uL (ref 150–400)
RBC: 3.83 MIL/uL — ABNORMAL LOW (ref 4.22–5.81)
RDW: 13.2 % (ref 11.5–15.5)
WBC: 10.9 10*3/uL — ABNORMAL HIGH (ref 4.0–10.5)
nRBC: 0 % (ref 0.0–0.2)

## 2020-08-04 LAB — RESP PANEL BY RT-PCR (FLU A&B, COVID) ARPGX2
Influenza A by PCR: NEGATIVE
Influenza B by PCR: NEGATIVE
SARS Coronavirus 2 by RT PCR: NEGATIVE

## 2020-08-04 LAB — COMPREHENSIVE METABOLIC PANEL
ALT: 20 U/L (ref 0–44)
AST: 33 U/L (ref 15–41)
Albumin: 3.2 g/dL — ABNORMAL LOW (ref 3.5–5.0)
Alkaline Phosphatase: 45 U/L (ref 38–126)
Anion gap: 6 (ref 5–15)
BUN: 18 mg/dL (ref 8–23)
CO2: 22 mmol/L (ref 22–32)
Calcium: 8.5 mg/dL — ABNORMAL LOW (ref 8.9–10.3)
Chloride: 107 mmol/L (ref 98–111)
Creatinine, Ser: 1.13 mg/dL (ref 0.61–1.24)
GFR, Estimated: >60 mL/min (ref >60)
Glucose, Bld: 122 mg/dL — ABNORMAL HIGH (ref 70–99)
Potassium: 4.1 mmol/L (ref 3.5–5.1)
Sodium: 135 mmol/L (ref 135–145)
Total Bilirubin: 1.2 mg/dL (ref 0.3–1.2)
Total Protein: 5.9 g/dL — ABNORMAL LOW (ref 6.5–8.1)

## 2020-08-04 LAB — BASIC METABOLIC PANEL
Anion gap: 7 (ref 5–15)
BUN: 17 mg/dL (ref 8–23)
CO2: 25 mmol/L (ref 22–32)
Calcium: 9 mg/dL (ref 8.9–10.3)
Chloride: 102 mmol/L (ref 98–111)
Creatinine, Ser: 1.11 mg/dL (ref 0.61–1.24)
GFR, Estimated: >60 mL/min (ref >60)
Glucose, Bld: 108 mg/dL — ABNORMAL HIGH (ref 70–99)
Potassium: 3.6 mmol/L (ref 3.5–5.1)
Sodium: 134 mmol/L — ABNORMAL LOW (ref 135–145)

## 2020-08-04 MED ORDER — ONDANSETRON 4 MG PO TBDP: 4.0000 mg | ORAL_TABLET | Freq: Four times a day (QID) | ORAL | Status: DC | PRN

## 2020-08-04 MED ORDER — FUROSEMIDE 20 MG PO TABS: 20.0000 mg | ORAL_TABLET | Freq: Every day | ORAL | Status: DC | PRN

## 2020-08-04 MED ORDER — LIDOCAINE HCL 2 % IJ SOLN
5.0000 mL | Freq: Once | INTRAMUSCULAR | Status: AC
  Administered 2020-08-04: 100 mg

## 2020-08-04 MED ORDER — HALOPERIDOL LACTATE 5 MG/ML IJ SOLN
5.0000 mg | Freq: Four times a day (QID) | INTRAMUSCULAR | Status: DC | PRN
  Administered 2020-08-04 – 2020-08-06 (×4): 5 mg via INTRAVENOUS
  Filled 2020-08-04 (×4): qty 1

## 2020-08-04 MED ORDER — ONDANSETRON HCL 4 MG/2ML IJ SOLN: 4.0000 mg | Freq: Four times a day (QID) | INTRAMUSCULAR | Status: DC | PRN

## 2020-08-04 MED ORDER — SERTRALINE HCL 100 MG PO TABS
100.0000 mg | ORAL_TABLET | Freq: Every day | ORAL | Status: DC
  Administered 2020-08-04 – 2020-08-06 (×3): 100 mg via ORAL
  Filled 2020-08-04 (×3): qty 1

## 2020-08-04 MED ORDER — DOCUSATE SODIUM 100 MG PO CAPS
100.0000 mg | ORAL_CAPSULE | Freq: Two times a day (BID) | ORAL | Status: DC
  Administered 2020-08-04: 100 mg via ORAL
  Filled 2020-08-04 (×4): qty 1

## 2020-08-04 MED ORDER — POLYETHYLENE GLYCOL 3350 17 G PO PACK: 17.0000 g | PACK | Freq: Every day | ORAL | Status: DC | PRN

## 2020-08-04 MED ORDER — IOHEXOL 300 MG/ML  SOLN
100.0000 mL | Freq: Once | INTRAMUSCULAR | Status: AC | PRN
  Administered 2020-08-04: 100 mL via INTRAVENOUS

## 2020-08-04 MED ORDER — ACETAMINOPHEN 500 MG PO TABS
1000.0000 mg | ORAL_TABLET | Freq: Four times a day (QID) | ORAL | Status: DC
  Administered 2020-08-04 – 2020-08-06 (×5): 1000 mg via ORAL
  Filled 2020-08-04 (×6): qty 2

## 2020-08-04 MED ORDER — OXYCODONE HCL 20 MG/ML PO CONC
2.5000 mg | ORAL | Status: DC | PRN
  Administered 2020-08-05 – 2020-08-06 (×3): 5 mg via ORAL
  Filled 2020-08-04 (×3): qty 1

## 2020-08-04 MED ORDER — MORPHINE SULFATE (PF) 2 MG/ML IV SOLN
1.0000 mg | INTRAVENOUS | Status: DC | PRN
  Administered 2020-08-04: 2 mg via INTRAVENOUS
  Filled 2020-08-04: qty 1

## 2020-08-04 MED ORDER — SODIUM CHLORIDE 0.9 % IV SOLN: INTRAVENOUS | Status: DC

## 2020-08-04 MED ORDER — LIDOCAINE-EPINEPHRINE (PF) 2 %-1:200000 IJ SOLN
INTRAMUSCULAR | Status: AC
  Filled 2020-08-04: qty 20

## 2020-08-04 MED ORDER — METHOCARBAMOL 500 MG PO TABS
1000.0000 mg | ORAL_TABLET | Freq: Three times a day (TID) | ORAL | Status: DC
  Administered 2020-08-04 – 2020-08-05 (×3): 1000 mg via ORAL
  Filled 2020-08-04 (×3): qty 2

## 2020-08-04 MED ORDER — SODIUM CHLORIDE 0.9 % IV BOLUS
500.0000 mL | Freq: Once | INTRAVENOUS | Status: AC
  Administered 2020-08-04: 500 mL via INTRAVENOUS

## 2020-08-04 MED ORDER — LEVETIRACETAM IN NACL 500 MG/100ML IV SOLN
500.0000 mg | Freq: Two times a day (BID) | INTRAVENOUS | Status: DC
  Administered 2020-08-04 – 2020-08-06 (×5): 500 mg via INTRAVENOUS
  Filled 2020-08-04 (×6): qty 100

## 2020-08-04 MED ORDER — RISPERIDONE 0.5 MG PO TABS
0.2500 mg | ORAL_TABLET | Freq: Every day | ORAL | Status: DC
  Administered 2020-08-04 – 2020-08-06 (×2): 0.25 mg via ORAL
  Filled 2020-08-04 (×3): qty 1

## 2020-08-04 MED ORDER — TAMSULOSIN HCL 0.4 MG PO CAPS
0.4000 mg | ORAL_CAPSULE | Freq: Every day | ORAL | Status: DC
  Administered 2020-08-04: 0.4 mg via ORAL
  Filled 2020-08-04: qty 1

## 2020-08-04 NOTE — Progress Notes (Signed)
Orthopedic Tech Progress Note Patient Details:  Sean Lozano 1933-07-12 [id number] Level 2 trauma Patient ID: Sean Lozano, male   DOB: 10-May-1933, 86 y.o.   MRN: [id number]   [name] 08/04/2020, 8:02 AM

## 2020-08-04 NOTE — Discharge Instructions (Addendum)
Local wound care with bacitracin and dressing change twice daily.  Staples are to be removed in 1 week.  Please follow-up with primary care provider for this.  Return to the emergency department for severe headache, changes in mental status, seizures/convulsions, or other new and concerning symptoms.

## 2020-08-04 NOTE — H&P (Signed)
TRAUMA H&P  08/04/2020, 1:25 PM   Chief Complaint: Level 2 trauma activation for GLF on plavix  The patient is an 87 y.o. male.   HPI: 69M s/p GLF striking his head, came to ED underwent imaging and had staples placed into a scalp laceration. Went back to memory care facility where he had another GLF. Known dementia, h/o frequent falls, uses a walker, most falls are related to not using walker. Cardiac patient s/p 6vCABG and stenting, most recently in 2013/4, last visit 01/2020. Recs to d/c plavix by "geriatric specialist" and plans to d/w cardiologist next week at scheduled appt. Has been living in the facility since 04/01/2022wife died January 12, 2020.   Past Medical History:  Diagnosis Date  . BPH (benign prostatic hyperplasia)   . CAD (coronary artery disease)   . Dementia (HCC)   . Frequent falls   . GERD (gastroesophageal reflux disease)   . Hyperlipemia   . Hypertension   . Osteoarthritis     Past Surgical History:  Procedure Laterality Date  . CORONARY ANGIOPLASTY WITH STENT PLACEMENT    . LEG SURGERY     for a fracture    No pertinent family history.  Social History:  reports that he has never smoked. He has never used smokeless tobacco. He reports previous alcohol use. He reports that he does not use drugs.   Allergies:  Allergies  Allergen Reactions  . Atenolol Other (See Comments)    Listed on MAR  . Codeine Other (See Comments)    Listed on MAR  . Ondansetron Other (See Comments)    Listed on MAR  . Rosuvastatin Other (See Comments)    Listed on MAR  . Simvastatin Other (See Comments)    Listed on MAR  . Tramadol Other (See Comments)    Listed on MAR  . Zetia [Ezetimibe] Other (See Comments)    Listed on MAR    Medications: reviewed  Results for orders placed or performed during the hospital encounter of 08/04/20 (from the past 48 hour(s))  Comprehensive metabolic panel     Status: Abnormal   Collection Time: 08/04/20  8:00 AM  Result Value Ref Range    Sodium 135 135 - 145 mmol/L   Potassium 4.1 3.5 - 5.1 mmol/L   Chloride 107 98 - 111 mmol/L   CO2 22 22 - 32 mmol/L   Glucose, Bld 122 (H) 70 - 99 mg/dL    Comment: Glucose reference range applies only to samples taken after fasting for at least 8 hours.   BUN 18 8 - 23 mg/dL   Creatinine, Ser 7.59 0.61 - 1.24 mg/dL   Calcium 8.5 (L) 8.9 - 10.3 mg/dL   Total Protein 5.9 (L) 6.5 - 8.1 g/dL   Albumin 3.2 (L) 3.5 - 5.0 g/dL   AST 33 15 - 41 U/L   ALT 20 0 - 44 U/L   Alkaline Phosphatase 45 38 - 126 U/L   Total Bilirubin 1.2 0.3 - 1.2 mg/dL   GFR, Estimated >16 >38 mL/min    Comment: (NOTE) Calculated using the CKD-EPI Creatinine Equation (2021)    Anion gap 6 5 - 15    Comment: Performed at [facility], [address] [place], [place] [address]  CBC     Status: Abnormal   Collection Time: 08/04/20  8:00 AM  Result Value Ref Range   WBC 10.9 (H) 4.0 - 10.5 K/uL   RBC 3.38 (L) 4.22 - 5.81 MIL/uL  Hemoglobin 11.5 (L) 13.0 - 17.0 g/dL   HCT 36.4 (L) 39.0 - 52.0 %   MCV 107.7 (H) 80.0 - 100.0 fL   MCH 34.0 26.0 - 34.0 pg   MCHC 31.6 30.0 - 36.0 g/dL   RDW 13.2 11.5 - 15.5 %   Platelets 153 150 - 400 K/uL   nRBC 0.0 0.0 - 0.2 %    Comment: Performed at [facility] [address] [place], [place] [address]  Resp Panel by RT-PCR (Flu A&B, Covid) Nasopharyngeal Swab     Status: None   Collection Time: 08/04/20 11:34 AM   Specimen: Nasopharyngeal Swab; Nasopharyngeal(NP) swabs in vial transport medium  Result Value Ref Range   SARS Coronavirus 2 by RT PCR NEGATIVE NEGATIVE    Comment: (NOTE) SARS-CoV-2 target nucleic acids are NOT DETECTED.  The SARS-CoV-2 RNA is generally detectable in upper respiratory specimens during the acute phase of infection. The lowest concentration of SARS-CoV-2 viral copies this assay can detect is 138 copies/mL. A negative result does not preclude SARS-Cov-2 infection and should not be used as the sole basis for treatment or other  patient management decisions. A negative result may occur with  improper specimen collection/handling, submission of specimen other than nasopharyngeal swab, presence of viral mutation(s) within the areas targeted by this assay, and inadequate number of viral copies(<138 copies/mL). A negative result must be combined with clinical observations, patient history, and epidemiological information. The expected result is Negative.  Fact Sheet for Patients:  EntrepreneurPulse.com.au  Fact Sheet for Healthcare Providers:  IncredibleEmployment.be  This test is no t yet approved or cleared by the [place] FDA and  has been authorized for detection and/or diagnosis of SARS-CoV-2 by FDA under an Emergency Use Authorization (EUA). This EUA will remain  in effect (meaning this test can be used) for the duration of the COVID-19 declaration under Section 564(b)(1) of the Act, 21 U.S.C.section 360bbb-3(b)(1), unless the authorization is terminated  or revoked sooner.       Influenza A by PCR NEGATIVE NEGATIVE   Influenza B by PCR NEGATIVE NEGATIVE    Comment: (NOTE) The Xpert Xpress SARS-CoV-2/FLU/RSV plus assay is intended as an aid in the diagnosis of influenza from Nasopharyngeal swab specimens and should not be used as a sole basis for treatment. Nasal washings and aspirates are unacceptable for Xpert Xpress SARS-CoV-2/FLU/RSV testing.  Fact Sheet for Patients: EntrepreneurPulse.com.au  Fact Sheet for Healthcare Providers: IncredibleEmployment.be  This test is not yet approved or cleared by the [place] FDA and has been authorized for detection and/or diagnosis of SARS-CoV-2 by FDA under an Emergency Use Authorization (EUA). This EUA will remain in effect (meaning this test can be used) for the duration of the COVID-19 declaration under Section 564(b)(1) of the Act, 21 U.S.C. section 360bbb-3(b)(1), unless  the authorization is terminated or revoked.  Performed at [facility], [place] [address], [place],  [address]     CT Head Wo Contrast  Result Date: 08/04/2020 CLINICAL DATA:  Fall on anticoagulation. EXAM: CT HEAD WITHOUT CONTRAST TECHNIQUE: Contiguous axial images were obtained from the base of the skull through the vertex without intravenous contrast. COMPARISON:  Head CT from earlier today. FINDINGS: Brain: New small right frontal parafalcine subdural hematoma measuring 3 mm thickness superiorly. New small subdural hematoma layering along the left tentorium. No evidence of parenchymal hemorrhage. No mass lesion, mass effect, or midline shift. No CT evidence of acute infarction. Generalized cerebral volume loss. Nonspecific moderate subcortical  and periventricular white matter hypodensity, most in keeping with chronic small vessel ischemic change. Cerebral ventricle sizes are stable and concordant with the degree of cerebral volume loss. Vascular: No acute abnormality. Skull: No evidence of calvarial fracture. Scalp staples noted at the posterior left vertex. Sinuses/Orbits: The visualized paranasal sinuses are essentially clear. Other:  The mastoid air cells are unopacified. IMPRESSION: 1. New small right frontal parafalcine subdural hematoma. New small subdural hematoma layering along the left tentorium. No significant mass effect. No midline shift. 2. Generalized cerebral volume loss and moderate chronic small vessel ischemic changes in the cerebral white matter. Critical Value/emergent results were called by telephone at the time of interpretation on 08/04/2020 at 8:31 am to provider [facility] , who verbally acknowledged these results. Electronically Signed   By: Ilona Sorrel M.D.   On: 08/04/2020 08:33   CT Cervical Spine Wo Contrast  Result Date: 08/04/2020 CLINICAL DATA:  Recurrent fall on anticoagulation. EXAM: CT CERVICAL SPINE WITHOUT CONTRAST TECHNIQUE: Multidetector CT  imaging of the cervical spine was performed without intravenous contrast. Multiplanar CT image reconstructions were also generated. COMPARISON:  Cervical spine CT from earlier today. FINDINGS: Alignment: Straightening of the cervical spine. No facet subluxation. Dens is well positioned between the lateral masses of C1. Skull base and vertebrae: No acute fracture. No primary bone lesion or focal pathologic process. Soft tissues and spinal canal: No prevertebral edema. No visible canal hematoma. Disc levels: Status post ACDF C4-6 with no hardware fracture or loosening. Bony fusion across the C3-4, C4-5 and C5-6 disc spaces. Marked degenerative disc disease throughout the cervical spine. Multilevel moderate effacement of the anterior thecal sac by posterior disc osteophyte complex, most prominent C6-7. Advanced bilateral facet arthropathy. Moderate degenerative foraminal stenosis bilaterally at C3-4 and C4-5, on the left at C5-6 and C6-7 and on the right at C7-T1. Upper chest: No acute abnormality. Other: Visualized mastoid air cells appear clear. No discrete thyroid nodules. No pathologically enlarged cervical nodes. IMPRESSION: 1. No cervical spine fracture or subluxation. 2. Status post ACDF C4-6 with no hardware complication. Bony fusion across the C3-4, C4-5 and C5-6 disc spaces. 3. Marked degenerative changes throughout the cervical spine as detailed. Electronically Signed   By: Ilona Sorrel M.D.   On: 08/04/2020 08:40   CT ABDOMEN PELVIS W CONTRAST  Result Date: 08/04/2020 CLINICAL DATA:  87 year old male status post 2 falls in the past 24 hours. On blood thinners. Dementia. EXAM: CT ABDOMEN AND PELVIS WITH CONTRAST TECHNIQUE: Multidetector CT imaging of the abdomen and pelvis was performed using the standard protocol following bolus administration of intravenous contrast. CONTRAST:  134mL OMNIPAQUE IOHEXOL 300 MG/ML  SOLN COMPARISON:  No prior abdominal or pelvis imaging. FINDINGS: Lower chest: Evidence of  prior CABG. No cardiomegaly or pericardial effusion. Patchy bilateral lung base opacity most resembles atelectasis and/or scarring. Hepatobiliary: Cholelithiasis. Otherwise negative gallbladder. Negative liver. Pancreas: Partially atrophied, negative. Spleen: Negative. Adrenals/Urinary Tract: Normal adrenal glands. Simple fluid density benign appearing left renal upper pole cyst. Symmetric renal enhancement and contrast excretion. No hydroureter. Although there is mild nonspecific inflammatory stranding at the right pelvic inlet along the course of the right ureter seen on series 3, image 65. The bladder dome and decompressed small bowel loops are also nearby. The bladder is moderately distended (280 mL) but otherwise unremarkable. Stomach/Bowel: Moderate retained stool in the rectosigmoid colon. Redundant sigmoid with diverticulosis. Redundant transverse colon. Diverticulosis in the right colon. No large bowel inflammation. Diminutive or absent appendix. Decompressed and negative terminal  ileum. No dilated small bowel. Fat containing umbilical hernia. Negative stomach and duodenum. No free air or free fluid. Vascular/Lymphatic: Extensive Aortoiliac calcified atherosclerosis. Major arterial structures remain patent. Patent portal venous system. No lymphadenopathy. Reproductive: Negative. Other: Nonspecific mild presacral stranding.  No pelvic free fluid. Musculoskeletal: Prior sternotomy. Flowing osteophytes in the lower thoracic spine resulting in multilevel ankylosis. Levoconvex lumbar scoliosis. Mild L4 superior endplate compression fracture with acute appearing cortical step-off on coronal image 75. Minimal loss of height. No retropulsion. No complicating features. Other lumbar levels appear intact. Sacrum, SI joints, pelvis and proximal femurs appear intact. Asymmetric left hip joint degeneration. Coccyx is comminuted, although this is age indeterminate. Furthermore there is indistinct soft tissue swelling and  stranding along the posteroinferior gluteus muscles, posterior to the right ischium (series 3, image 93). And there is possibly a focus of intramuscular venous hemorrhage on series 3, image 98. No other superficial soft tissue injury identified. IMPRESSION: 1. Mild L4 superior endplate compression fracture appears acute with no retropulsion or complicating features. 2. Intramuscular hematoma with possible active venous bleeding along the inferior right gluteus, posterior to the right ischium. Nearby fracture of the coccyx, age indeterminate but probably acute. 3. Small area of nonspecific fat/mesenteric stranding at the right pelvic inlet. Difficult to exclude mesenteric contusion here, although the adjacent right ureter and small bowel appear to remain normal. 4. No other acute traumatic injury identified in the abdomen or pelvis. 5. Cholelithiasis. Diverticulosis of the colon. Aortic Atherosclerosis (ICD10-I70.0). Fat containing umbilical hernia. Electronically Signed   By: Genevie Ann M.D.   On: 08/04/2020 11:16    ROS 10 point review of systems is unable to be obtained secondary to his dementia, except minimal things such as pain in his head and his lower back.  Blood pressure (!) 114/95, pulse 66, temperature (!) 97.5 F (36.4 C), temperature source Oral, resp. rate 14, height 6' (1.829 m), weight 82.6 kg, SpO2 96 %.  Secondary Survey:  GCS: E(4)//V(5)//M(6) Constitutional: well-developed, well-nourished elderly white male in NAD Skull: normocephalic, multiple lacerations to middle parietal head with staples in place as well as one in the lower left occiput, staples also present.  Eyes: pupils equal, round, reactive to light, 65mm b/l, moist conjunctiva Face/ENT: midface stable without deformity, normal dentition, external inspection of ears and nose normal, hearing intact Oropharynx: normal oropharyngeal mucosa, no blood Neck: no thyromegaly, trachea midline, no midline cervical tenderness to  palpation. Chest: breath sounds equal bilaterally, normal respiratory effort, no midline or lateral chest wall tenderness to palpation/deformity Abdomen: soft, NT, no bruising, no hepatosplenomegaly, +BS, ND Pelvis: stable GU: normal male genitalia Back: no wounds, some tenderness over lumbar spine, no T/L spine stepoffs Rectal: deferred Extremities: 2+  radial and pedal pulses bilaterally, motor and sensation intact to bilateral UE and LE, +1-+2 peripheral edema in LE MSK: unable to assess gait/station, no clubbing/cyanosis of fingers/toes, some limited ROM of all four extremities due to advanced age and inability to move much in bed Skin: warm, dry, no rashes.  There is small ecchymosis noted on his right gluteus.  Mildly tender. Neuro: CN 2-12 are grossly intact.  Sensation normal throughout.  Has some aphasia so difficulty communicating. Psych: very demented, unable to thoroughly assess.   Assessment/Plan: GLF R SDH - NSGY c/s, Dr. Annette Stable, keppra x7d for sz ppx L4 endplate fx - NSGY c/s, Dr. Annette Stable, therapies after neurosgy recs R gluteal hematoma with extrav - trend hgb q8h CAD - hold plavix for 2 weeks, but  sees cardiologist next week and d/w son about the need to discuss cessation of plavix.   Dementia - resume home meds, sun downs significantly per son. Prn haldol ordered as well.  Palliative care consult to discuss [place] BLE edema - son states it is not related to his heart, resume home lasix dose. FEN - CLD DVT - SCDs, hold chemical ppx  Dispo - Admit to inpatient--step-down  Family update: Son was present during our evaluation and we have a conversation with him outside the room regarding code status, etc.  Henreitta Cea 1:39 PM 08/04/2020 Please see Amion for pager and on call schedule

## 2020-08-04 NOTE — Consult Note (Signed)
Consultation Note Date: 08/04/2020   Patient Name: Sean Lozano  DOB: 12/27/1933  MRN: [id number]  Age / Sex: 87 y.o., male  PCP: Biagio Borg, MD Referring Physician: Md, Trauma, MD  Reason for Consultation: Establishing goals of care  HPI/Patient Profile: 87 y.o. male  with past medical history of dementia and CAD presented to the [facility] ED on 08/04/2020 from [facility] after a fall on Plavix.  This is the second fall patient has sustained in a 24-hour timeframe.  Patient originally presented to the ED last night after a fall with injury that required 5 staples to the back of his head.  CT at that time was unremarkable and he was discharged back to [facility].  Unfortunately he sustained another fall with laceration to base of skull and was brought back to the ED. Imaging this time revealed a new small right frontal parafalcine subdural hematoma and a new small subdural hematoma layering along the left tentorium. There was no significant mass effect and no midline shift. His CT abdomen/pelvis showed what looks like an L4 compression fracture. Patient was admitted on 08/04/2020 for observation. Neurosurgery recommends holding Palvix for 2 weeks.    Clinical Assessment and Goals of Care: I have reviewed medical records including EPIC notes, labs, and imaging. Received report from primary RN -no acute concerns.  RN reports when patient arrived to the unit he was disoriented, combative, agitated, and not redirectable.  RN received orders for a sitter and restraints as well as Haldol.  RN states patient was given Haldol which improved symptoms.  Went to visit patient at bedside - no family/visitors present. Patient was lying in bed awake, alert, disoriented, and able to participate in minimal conversation.  He is not able to make complex medical decisions.  Signs and non-verbal gestures of pain/ discomfort  noted.  Patient states he is having pain "everywhere."  No respiratory distress, increased work of breathing, or secretions noted.   Requested RN administer PRN dose of morphine.  Met with HCPOA/son/Al via phone to discuss diagnosis, prognosis, GOC, EOL wishes, disposition, and options.  I introduced Palliative Medicine as specialized medical care for people living with serious illness. It focuses on providing relief from the symptoms and stress of a serious illness. The goal is to improve quality of life for both the patient and the family.  We discussed a brief life review of the patient as well as functional and nutritional status.  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  His wife unfortunately passed away in 01/07/20; they have 2 sons.  1 son lives in [place] and the other son Al lives close by and has been taking care of the patient's healthcare for over 3 years.  Patient moved into [facility] independent living 3 years ago, moved to their assisted living in June 2021, and eventually was moved into their memory care unit in March 2022.  Al recognizes Mr. Youman gradual decline over the last 3 years but  states the decline has been "fast" since the patient's wife passed.  Prior to hospitalization the patient was able to ambulate with a walker; however, as his dementia has progressed he forgets to use his walker which subsequently results in frequent falls.  I will also states the patient has been sundowning regularly and becoming angry and combative.  Al states the patient has not had much of an appetite and has not been eating well since his wife passed.  Patient drove for the last time June 2021 and got lost.  [facility] assisted Al in finding a geriatric consultant who has been helping manage the patient's dementia symptoms and medical care.  We discussed patient's current illness and what it means in the larger context of patient's on-going  co-morbidities.  Al has a clear understanding of the patient's current medical situation.  He also understands that dementia is a progressive, non-curable disease underlying the patient's current acute medical conditions. Natural disease trajectory and expectations at EOL were discussed. I attempted to elicit values and goals of care important to the patient. The difference between aggressive medical intervention and comfort care was considered in light of the patient's goals of care.  Al states he does not see a reason to "medically do a lot."  Al states he wants the patient to be happy and the patient has been happy at memory care.  We discussed the patient is at high risk for rehospitalization due to his progressive dementia and increased risk for falls with injury.  We discussed the possible need for invasive procedures such as surgery -I would not want to pursue invasive interventions but rather at that time transition to full comfort care with hospice.  At this time I would like patient to continue to receive gentle medical interventions and he is okay with patient returning to the hospital if needed for evaluation.    Hospice and Palliative Care services outpatient were explained and offered.  Provided education and counseling at length on the philosophy and benefits of hospice care. Discussed that it offers a holistic approach to care in the setting of end-stage illness, and is about supporting the patient where they are allowing nature to take it's course. Discussed the hospice team includes RNs, physicians, social workers, and chaplains. They can provide personal care, support for the family, and help keep patient out of the hospital. Al tells me his geriatric consultant had also talked about an outpatient palliative care referral and he is unsure if that referral had been placed -he is going to check on that tonight and we will touch base tomorrow.  If his geriatric consultant has not placed referral  offered to place referral for patient before he is discharged -Al was agreeable.  Al is open for the patient to receive hospice services when he feels the time is appropriate.  We did discuss that with the patient's chronic, progressive diseases, we as the medical team can see a time coming down the road where the patient's diseases will reach end stages and comfort care would be appropriate at that time; discussed how it is important to prepare for that time. Al expressed understanding.  Advance directives, concepts specific to code status, and rehospitalization were considered and discussed.  Al tells me he is the patient's healthcare power of attorney -requested he provide copy of documents. Encouraged Al to consider DNR/DNI status understanding evidenced based poor outcomes in similar hospitalized patient, as the cause of arrest is likely associated with advanced chronic/terminal illness  rather than an easily reversible acute cardio-pulmonary event. I explained that DNR/DNI does not change the medical plan and it only comes into effect after a person has arrested (died).  It is a protective measure to keep [place] from harming the patient in their last moments of life. Al was agreeable to DNR/DNI with understanding that patient would not receive CPR, defibrillation, ACLS medications, or intubation.  Al tells me he has had discussions with patient in the past and patient would not want his life prolonged by heroic measures or artificial means.  Discussed with patient/family the importance of continued conversation with each other and the medical providers regarding overall plan of care and treatment options, ensuring decisions are within the context of the patient's values and GOCs.    Questions and concerns were addressed. The patient/family was encouraged to call with questions and/or concerns. PMT card was provided.   Primary Decision Maker HCPOA/son/Al Daughtridge - need to obtain copy of HCPOA     SUMMARY OF RECOMMENDATIONS:  Continue gentle medical interventions without invasive procedures  Initiated DNR/DNI -durable DNR form completed and placed in shadow chart; copy was made and will be scanned into the [facility]ynca/ACP tab  Plan is for patient to return to [name] with outpatient palliative care.  Son works with a Naval architect who may have already placed outpatient palliative care referral -he is going to follow-up on this tonight. PMT will follow up with son tomorrow 4/26  Son does not wish for patient to have his life prolonged by heroic measures or artificial means; if patient declines he is open to further [place] discussions for transition to comfort measures  Continue Haldol for agitation as well as morphine and Roxicodone for pain  Please obtain copy of HCPOA and placed in shadow chart if able  PMT will continue to follow and support holistically  Code Status/Advance Care Planning:  DNR  Palliative Prophylaxis:   Aspiration, Bowel Regimen, Delirium Protocol, Frequent Pain Assessment, Oral Care and Turn Reposition  Additional Recommendations (Limitations, Scope, Preferences):  Full Scope Treatment, No Artificial Feeding and No Tracheostomy  Psycho-social/Spiritual:   Desire for further Chaplaincy support:no Created space and opportunity for patient and family to express thoughts and feelings regarding patient's current medical situation.   Emotional support and therapeutic listening provided.  Prognosis:  Poor in the setting of advanced age, dementia, recurrent hospitalizations, and multiple comorbidities  Discharge Planning: [facility] for rehab with Palliative care service follow-up      Primary Diagnoses: Present on Admission: . SDH (subdural hematoma) (HCC)   I have reviewed the medical record, interviewed the patient and family, and examined the patient. The following aspects are pertinent.  Past Medical History:   Diagnosis Date  . BPH (benign prostatic hyperplasia)   . CAD (coronary artery disease)   . Dementia ([place])   . Frequent falls   . GERD (gastroesophageal reflux disease)   . Hyperlipemia   . Hypertension   . Osteoarthritis    Social History   Socioeconomic History  . Marital status: Married    Spouse name: Not on file  . Number of children: Not on file  . Years of education: Not on file  . Highest education level: Not on file  Occupational History  . Not on file  Tobacco Use  . Smoking status: Never Smoker  . Smokeless tobacco: Never Used  Substance and Sexual Activity  . Alcohol use: Not Currently  . Drug use: Never  . Sexual  activity: Not on file  Other Topics Concern  . Not on file  Social History Narrative  . Not on file   Social Determinants of Health   Financial Resource Strain: Not on file  Food Insecurity: Not on file  Transportation Needs: Not on file  Physical Activity: Not on file  Stress: Not on file  Social Connections: Not on file   No family history on file. Scheduled Meds: . acetaminophen  1,000 mg Oral Q6H  . docusate sodium  100 mg Oral BID  . methocarbamol  1,000 mg Oral Q8H  . risperiDONE  0.25 mg Oral Q1400  . sertraline  100 mg Oral Daily  . tamsulosin  0.4 mg Oral QHS   Continuous Infusions: . sodium chloride 100 mL/hr at 08/04/20 1249  . levETIRAcetam Stopped (08/04/20 1430)   PRN Meds:.furosemide, haloperidol lactate, morphine injection, ondansetron **OR** ondansetron (ZOFRAN) IV, oxyCODONE, polyethylene glycol Medications Prior to Admission:  Prior to Admission medications   Medication Sig Start Date End Date Taking? Authorizing Provider  acetaminophen (TYLENOL) 500 MG tablet Take 1,000 mg by mouth every 6 (six) hours as needed (temp greater than 100.5).   Yes [provider]  aspirin 81 MG chewable tablet Chew 81 mg by mouth at bedtime.   Yes [provider]  Calcium Carbonate-Vitamin D3 (CALCIUM + VITAMIN D3)  600-400 MG-UNIT TABS Take 1 tablet by mouth daily.   Yes [provider]  clopidogrel (PLAVIX) 75 MG tablet Take 75 mg by mouth at bedtime.   Yes [provider]  feeding supplement (ENSURE ENLIVE / ENSURE PLUS) LIQD Take 237 mLs by mouth every evening. Chocolate   Yes [provider]  furosemide (LASIX) 20 MG tablet Take 20 mg by mouth daily as needed for fluid or edema.   Yes [provider]  loperamide (IMODIUM A-D) 2 MG tablet Take 2-4 mg by mouth See admin instructions. Take 2 tablets by mouth after first loose stool, then 1 tablet by mouth after each subsequently loose stool (MAX 4 tabs in 24/hrs)   Yes [provider]  Multiple Vitamins-Minerals (CERTAVITE SENIOR) TABS Take 1 tablet by mouth daily.   Yes [provider]  nitroGLYCERIN (NITROSTAT) 0.4 MG SL tablet Place 0.4 mg under the tongue every 5 (five) minutes as needed for chest pain.   Yes [provider]  ondansetron (ZOFRAN) 4 MG tablet Take 4 mg by mouth every 8 (eight) hours as needed for nausea or vomiting.   Yes [provider]  polyethylene glycol (MIRALAX / GLYCOLAX) 17 g packet Take 17 g by mouth daily as needed for mild constipation.   Yes [provider]  pravastatin (PRAVACHOL) 40 MG tablet Take 40 mg by mouth every evening.   Yes [provider]  risperiDONE (RISPERDAL) 0.25 MG tablet Take 0.25 mg by mouth daily at 2 PM.   Yes [provider]  risperiDONE (RISPERDAL) 0.5 MG tablet Take 0.25 mg by mouth every 6 (six) hours as needed (anxiety (may give an hour before or after scheduled dose)).   Yes [provider]  sertraline (ZOLOFT) 100 MG tablet Take 100 mg by mouth daily.   Yes [provider]  tamsulosin (FLOMAX) 0.4 MG CAPS capsule Take 0.4 mg by mouth at bedtime.   Yes [provider]   Allergies  Allergen Reactions  . Atenolol Other (See Comments)    Listed on MAR  . Codeine Other (See  Comments)    Listed on MAR  .  Ondansetron Other (See Comments)    Listed on MAR  . Rosuvastatin Other (See Comments)    Listed on MAR  . Simvastatin Other (See Comments)    Listed on MAR  . Tramadol Other (See Comments)    Listed on MAR  . Zetia [Ezetimibe] Other (See Comments)    Listed on MAR   Review of Systems  Unable to perform ROS: Dementia    Physical Exam Vitals and nursing note reviewed.  Constitutional:      General: He is not in acute distress.    Appearance: He is ill-appearing.  Pulmonary:     Effort: No respiratory distress.  Skin:    General: Skin is warm and dry.     Findings: Laceration present.  Neurological:     Mental Status: He is alert. He is disoriented and confused.     Motor: Weakness present.  Psychiatric:        Mood and Affect: Mood is anxious.        Behavior: Behavior is cooperative.        Cognition and Memory: Cognition is impaired. Memory is impaired.        Judgment: Judgment is impulsive.     Vital Signs: BP (!) 114/95 (BP Location: Right Arm)   Pulse 66   Temp (!) 97.5 F (36.4 C) (Oral)   Resp 14   Ht 6' (1.829 m)   Wt 82.6 kg   SpO2 96%   BMI 24.68 kg/m  Pain Scale: Faces   Pain Score: 0-No pain   SpO2: SpO2: 96 % O2 Device:SpO2: 96 % O2 Flow Rate: .   IO: Intake/output summary:   Intake/Output Summary (Last 24 hours) at 08/04/2020 1618 Last data filed at 08/04/2020 1134 Gross per 24 hour  Intake 1000 ml  Output 300 ml  Net 700 ml    LBM:   Baseline Weight: Weight: 82.6 kg Most recent weight: Weight: 82.6 kg     Palliative Assessment/Data: PPS 40%     Time In: 1630 Time Out: 1746 Time Total: 76 minutes Greater than 50%  of this time was spent counseling and coordinating care related to the above assessment and plan.  Signed by: Lin Landsman, NP   Please contact Palliative Medicine Team phone at [phone] for questions and concerns.  For individual provider: See Amion  *Portions of this note  are a verbal dictation therefore any spelling and/or grammatical errors are due to the "[employer]" system interpretation.

## 2020-08-04 NOTE — Progress Notes (Signed)
PT Cancellation Note  Patient Details Name: Sean Lozano MRN: [id number] DOB: 07-Nov-1933   Cancelled Treatment:    Reason Eval/Treat Not Completed: Medical issues which prohibited therapy - awaiting trauma consult, note still active bleeding to hip hematoma with low hgb. PT to continue to follow acutely.  Stacie Glaze, PT DPT Acute Rehabilitation Services Pager [phone]  Office [phone]    Louis Matte 08/04/2020, 1:30 PM

## 2020-08-04 NOTE — ED Notes (Signed)
Report given to Nurse at [facility] memory care facility. Pt's son to transport patient back to facility. Cleaned patient's head wound and applied dressing to head. Placed patient in paper scrubs and slippers. Pt able to stand with standby assistance.

## 2020-08-04 NOTE — ED Notes (Signed)
Patient is resting comfortably. 

## 2020-08-04 NOTE — ED Provider Notes (Signed)
[facility] EMERGENCY DEPARTMENT Provider Note   CSN: [id number] Arrival date & time: 08/04/20  [id number]     History No chief complaint on file.   Sean Lozano is a 87 y.o. male. Level 5 caveat due to dementia. HPI Patient presents after repeat fall.  Reportedly found on the floor this morning.  Had been seen in the ER last night after another fall.  Per EMS had 12 staples placed on the back of his head but appears to only have 5 staples.  New laceration on right posterior scalp.  Patient initially without complaint.  Reportedly had pressure of 90/60 for EMS.  Upon arrival blood pressure 130/64.  No complaints.  With moving extremities particular legs only states that it is pain in the back of his head.  Came in as a level 2 trauma since patient is on Plavix. Patient also has notes under different MRN Sean Lozano, Sean Lozano [id number]    Past Medical History:  Diagnosis Date  . BPH (benign prostatic hyperplasia)   . CAD (coronary artery disease)   . Dementia ([place])   . Frequent falls   . GERD (gastroesophageal reflux disease)   . Hyperlipemia   . Hypertension   . Osteoarthritis     There are no problems to display for this patient.   Past Surgical History:  Procedure Laterality Date  . CORONARY ANGIOPLASTY WITH STENT PLACEMENT         No family history on file.     Home Medications Prior to Admission medications   Medication Sig Start Date End Date Taking? Authorizing Provider  acetaminophen (TYLENOL) 500 MG tablet Take 1,000 mg by mouth every 6 (six) hours as needed (temp greater than 100.5).   Yes [provider]  aspirin 81 MG chewable tablet Chew 81 mg by mouth at bedtime.   Yes [provider]  Calcium Carbonate-Vitamin D3 (CALCIUM + VITAMIN D3) 600-400 MG-UNIT TABS Take 1 tablet by mouth daily.   Yes [provider]  clopidogrel (PLAVIX) 75 MG tablet Take 75 mg by mouth at bedtime.   Yes [provider]   feeding supplement (ENSURE ENLIVE / ENSURE PLUS) LIQD Take 237 mLs by mouth every evening. Chocolate   Yes [provider]  furosemide (LASIX) 20 MG tablet Take 20 mg by mouth daily as needed for fluid or edema.   Yes [provider]  loperamide (IMODIUM A-D) 2 MG tablet Take 2-4 mg by mouth See admin instructions. Take 2 tablets by mouth after first loose stool, then 1 tablet by mouth after each subsequently loose stool (MAX 4 tabs in 24/hrs)   Yes [provider]  Multiple Vitamins-Minerals (CERTAVITE SENIOR) TABS Take 1 tablet by mouth daily.   Yes [provider]  nitroGLYCERIN (NITROSTAT) 0.4 MG SL tablet Place 0.4 mg under the tongue every 5 (five) minutes as needed for chest pain.   Yes [provider]  ondansetron (ZOFRAN) 4 MG tablet Take 4 mg by mouth every 8 (eight) hours as needed for nausea or vomiting.   Yes [provider]  polyethylene glycol (MIRALAX / GLYCOLAX) 17 g packet Take 17 g by mouth daily as needed for mild constipation.   Yes [provider]  pravastatin (PRAVACHOL) 40 MG tablet Take 40 mg by mouth every evening.   Yes [provider]  risperiDONE (RISPERDAL) 0.25 MG tablet Take 0.25 mg by mouth daily at 2 PM.   Yes [provider]  risperiDONE (RISPERDAL)  0.5 MG tablet Take 0.25 mg by mouth every 6 (six) hours as needed (anxiety (may give an hour before or after scheduled dose)).   Yes [provider]  sertraline (ZOLOFT) 100 MG tablet Take 100 mg by mouth daily.   Yes [provider]  tamsulosin (FLOMAX) 0.4 MG CAPS capsule Take 0.4 mg by mouth at bedtime.   Yes [provider]    Allergies    Atenolol, Codeine, Ondansetron, Rosuvastatin, Simvastatin, Tramadol, and Zetia [ezetimibe]  Review of Systems   Review of Systems  Unable to perform ROS: Dementia    Physical Exam Updated Vital Signs BP (!) 118/53   Pulse 62   Temp (!) 97.5 F (36.4 C) (Oral)    Resp 12   Ht 6' (1.829 m)   Wt 82.6 kg   SpO2 96%   BMI 24.68 kg/m   Physical Exam Vitals and nursing note reviewed.  HENT:     Head:     Comments: Vertical laceration on occipital area.  5 staples.  There is a horizontal laceration near the base of the skull on the right side.  Approximately 3 cm long.  No active bleeding.    Mouth/Throat:     Mouth: Mucous membranes are moist.  Eyes:     Pupils: Pupils are equal, round, and reactive to light.  Neck:     Comments: No midline tenderness. Cardiovascular:     Rate and Rhythm: Normal rate.  Pulmonary:     Breath sounds: No wheezing or rhonchi.  Abdominal:     Tenderness: There is no abdominal tenderness.  Musculoskeletal:        General: No tenderness.     Cervical back: Neck supple.     Comments: Patient initially had no lumbar tenderness.  On reexam did have tenderness in the lumbar area.  Also some bruising and fullness of right buttock area.  Skin:    General: Skin is warm.     Capillary Refill: Capillary refill takes less than 2 seconds.  Neurological:     Mental Status: He is alert.     Cranial Nerves: Cranial nerve deficit:       Comments: Awake and pleasant with some dementia.  At reported baseline.     ED Results / Procedures / Treatments   Labs (all labs ordered are listed, but only abnormal results are displayed) Labs Reviewed  COMPREHENSIVE METABOLIC PANEL - Abnormal; Notable for the following components:      Result Value   Glucose, Bld 122 (*)    Calcium 8.5 (*)    Total Protein 5.9 (*)    Albumin 3.2 (*)    All other components within normal limits  CBC - Abnormal; Notable for the following components:   WBC 10.9 (*)    RBC 3.38 (*)    Hemoglobin 11.5 (*)    HCT 36.4 (*)    MCV 107.7 (*)    All other components within normal limits    EKG None  Radiology CT Head Wo Contrast  Result Date: 08/04/2020 CLINICAL DATA:  Fall on anticoagulation. EXAM: CT HEAD WITHOUT CONTRAST TECHNIQUE: Contiguous  axial images were obtained from the base of the skull through the vertex without intravenous contrast. COMPARISON:  Head CT from earlier today. FINDINGS: Brain: New small right frontal parafalcine subdural hematoma measuring 3 mm thickness superiorly. New small subdural hematoma layering along the left tentorium. No evidence of parenchymal hemorrhage. No mass lesion, mass effect, or midline shift. No CT evidence  of acute infarction. Generalized cerebral volume loss. Nonspecific moderate subcortical and periventricular white matter hypodensity, most in keeping with chronic small vessel ischemic change. Cerebral ventricle sizes are stable and concordant with the degree of cerebral volume loss. Vascular: No acute abnormality. Skull: No evidence of calvarial fracture. Scalp staples noted at the posterior left vertex. Sinuses/Orbits: The visualized paranasal sinuses are essentially clear. Other:  The mastoid air cells are unopacified. IMPRESSION: 1. New small right frontal parafalcine subdural hematoma. New small subdural hematoma layering along the left tentorium. No significant mass effect. No midline shift. 2. Generalized cerebral volume loss and moderate chronic small vessel ischemic changes in the cerebral white matter. Critical Value/emergent results were called by telephone at the time of interpretation on 08/04/2020 at 8:31 am to provider [facility] , who verbally acknowledged these results. Electronically Signed   By: Ilona Sorrel M.D.   On: 08/04/2020 08:33   CT Cervical Spine Wo Contrast  Result Date: 08/04/2020 CLINICAL DATA:  Recurrent fall on anticoagulation. EXAM: CT CERVICAL SPINE WITHOUT CONTRAST TECHNIQUE: Multidetector CT imaging of the cervical spine was performed without intravenous contrast. Multiplanar CT image reconstructions were also generated. COMPARISON:  Cervical spine CT from earlier today. FINDINGS: Alignment: Straightening of the cervical spine. No facet subluxation. Dens is well  positioned between the lateral masses of C1. Skull base and vertebrae: No acute fracture. No primary bone lesion or focal pathologic process. Soft tissues and spinal canal: No prevertebral edema. No visible canal hematoma. Disc levels: Status post ACDF C4-6 with no hardware fracture or loosening. Bony fusion across the C3-4, C4-5 and C5-6 disc spaces. Marked degenerative disc disease throughout the cervical spine. Multilevel moderate effacement of the anterior thecal sac by posterior disc osteophyte complex, most prominent C6-7. Advanced bilateral facet arthropathy. Moderate degenerative foraminal stenosis bilaterally at C3-4 and C4-5, on the left at C5-6 and C6-7 and on the right at C7-T1. Upper chest: No acute abnormality. Other: Visualized mastoid air cells appear clear. No discrete thyroid nodules. No pathologically enlarged cervical nodes. IMPRESSION: 1. No cervical spine fracture or subluxation. 2. Status post ACDF C4-6 with no hardware complication. Bony fusion across the C3-4, C4-5 and C5-6 disc spaces. 3. Marked degenerative changes throughout the cervical spine as detailed. Electronically Signed   By: Ilona Sorrel M.D.   On: 08/04/2020 08:40   CT ABDOMEN PELVIS W CONTRAST  Result Date: 08/04/2020 CLINICAL DATA:  87 year old male status post 2 falls in the past 24 hours. On blood thinners. Dementia. EXAM: CT ABDOMEN AND PELVIS WITH CONTRAST TECHNIQUE: Multidetector CT imaging of the abdomen and pelvis was performed using the standard protocol following bolus administration of intravenous contrast. CONTRAST:  160mL OMNIPAQUE IOHEXOL 300 MG/ML  SOLN COMPARISON:  No prior abdominal or pelvis imaging. FINDINGS: Lower chest: Evidence of prior CABG. No cardiomegaly or pericardial effusion. Patchy bilateral lung base opacity most resembles atelectasis and/or scarring. Hepatobiliary: Cholelithiasis. Otherwise negative gallbladder. Negative liver. Pancreas: Partially atrophied, negative. Spleen: Negative.  Adrenals/Urinary Tract: Normal adrenal glands. Simple fluid density benign appearing left renal upper pole cyst. Symmetric renal enhancement and contrast excretion. No hydroureter. Although there is mild nonspecific inflammatory stranding at the right pelvic inlet along the course of the right ureter seen on series 3, image 65. The bladder dome and decompressed small bowel loops are also nearby. The bladder is moderately distended (280 mL) but otherwise unremarkable. Stomach/Bowel: Moderate retained stool in the rectosigmoid colon. Redundant sigmoid with diverticulosis. Redundant transverse colon. Diverticulosis in the right colon. No large  bowel inflammation. Diminutive or absent appendix. Decompressed and negative terminal ileum. No dilated small bowel. Fat containing umbilical hernia. Negative stomach and duodenum. No free air or free fluid. Vascular/Lymphatic: Extensive Aortoiliac calcified atherosclerosis. Major arterial structures remain patent. Patent portal venous system. No lymphadenopathy. Reproductive: Negative. Other: Nonspecific mild presacral stranding.  No pelvic free fluid. Musculoskeletal: Prior sternotomy. Flowing osteophytes in the lower thoracic spine resulting in multilevel ankylosis. Levoconvex lumbar scoliosis. Mild L4 superior endplate compression fracture with acute appearing cortical step-off on coronal image 75. Minimal loss of height. No retropulsion. No complicating features. Other lumbar levels appear intact. Sacrum, SI joints, pelvis and proximal femurs appear intact. Asymmetric left hip joint degeneration. Coccyx is comminuted, although this is age indeterminate. Furthermore there is indistinct soft tissue swelling and stranding along the posteroinferior gluteus muscles, posterior to the right ischium (series 3, image 93). And there is possibly a focus of intramuscular venous hemorrhage on series 3, image 98. No other superficial soft tissue injury identified. IMPRESSION: 1. Mild L4  superior endplate compression fracture appears acute with no retropulsion or complicating features. 2. Intramuscular hematoma with possible active venous bleeding along the inferior right gluteus, posterior to the right ischium. Nearby fracture of the coccyx, age indeterminate but probably acute. 3. Small area of nonspecific fat/mesenteric stranding at the right pelvic inlet. Difficult to exclude mesenteric contusion here, although the adjacent right ureter and small bowel appear to remain normal. 4. No other acute traumatic injury identified in the abdomen or pelvis. 5. Cholelithiasis. Diverticulosis of the colon. Aortic Atherosclerosis (ICD10-I70.0). Fat containing umbilical hernia. Electronically Signed   By: Genevie Ann M.D.   On: 08/04/2020 11:16    Procedures .Marland KitchenLaceration Repair  Date/Time: 08/04/2020 11:31 AM Performed by: Davonna Belling, MD Authorized by: Davonna Belling, MD   Consent:    Consent obtained:  Verbal   Consent given by:  Patient (son)   Risks, benefits, and alternatives were discussed: yes     Risks discussed:  Infection, pain, retained foreign body, poor cosmetic result, need for additional repair, nerve damage and poor wound healing   Alternatives discussed:  Delayed treatment and no treatment Anesthesia:    Anesthesia method:  None Laceration details:    Location:  Scalp   Scalp location:  Occipital   Length (cm):  3 Pre-procedure details:    Preparation:  Imaging obtained to evaluate for foreign bodies Exploration:    Limited defect created (wound extended): no     Imaging outcome: foreign body not noted     Wound exploration: wound explored through full range of motion     Contaminated: no   Treatment:    Area cleansed with:  Saline   Amount of cleaning:  Standard Skin repair:    Repair method:  Staples   Number of staples:  3 Approximation:    Approximation:  Close Repair type:    Repair type:  Simple Post-procedure details:    Dressing:  Sterile  dressing   Procedure completion:  Tolerated well, no immediate complications     Medications Ordered in ED Medications  lidocaine (XYLOCAINE) 2 % (with pres) injection 100 mg (100 mg Infiltration Given by Other 08/04/20 0853)  sodium chloride 0.9 % bolus 500 mL (0 mLs Intravenous Stopped 08/04/20 1114)  iohexol (OMNIPAQUE) 300 MG/ML solution 100 mL (100 mLs Intravenous Contrast Given 08/04/20 1046)    ED Course  I have reviewed the triage vital signs and the nursing notes.  Pertinent labs & imaging results that were available  during my care of the patient were reviewed by me and considered in my medical decision making (see chart for details).    MDM Rules/Calculators/A&P                          Patient presents with recurrent fall.  Fell last night and then again this morning found on floor.  Initial CT last night did not show hematoma, however 1 done this morning showed subdural hematoma.It is on the falx and tentorium.  Discussed with Dr. Trenton Gammon from neurosurgery.  Recommended holding Plavix for 2 weeks but did not need further intervention at this time.  Did not need reimaging.  However hemoglobin has dropped.  Had an episode of hypotension last night and again today.  Per the patient's son who is now present this is not uncommon for him.  They have been adjusting his medicines because of it.  However hemoglobin done and decreased wearing some other occult bleeding although it easily could have been left on the floor from the head wound.  CT scan done and showed lumbar compression fracture at L4.  Also coccyx fracture.  However also hematoma with some active venous bleeding in the right buttock.  With decreasing hemoglobin hematoma and anticoagulation I feels the patient benefit from mission to the hospital.  Discussed with Dr. Grandville Silos from trauma surgery.  CRITICAL CARE Performed by: Davonna Belling Total critical care time: 30 minutes Critical care time was exclusive of separately  billable procedures and treating other patients. Critical care was necessary to treat or prevent imminent or life-threatening deterioration. Critical care was time spent personally by me on the following activities: development of treatment plan with patient and/or surrogate as well as nursing, discussions with consultants, evaluation of patient's response to treatment, examination of patient, obtaining history from patient or surrogate, ordering and performing treatments and interventions, ordering and review of laboratory studies, ordering and review of radiographic studies, pulse oximetry and re-evaluation of patient's condition.  Final Clinical Impression(s) / ED Diagnoses Final diagnoses:  Fall, initial encounter  Laceration of scalp, initial encounter  Subdural hematoma ([place])  Traumatic hematoma of buttock, initial encounter  Compression fracture of L4 vertebra, initial encounter ([place])  Closed fracture of coccyx, initial encounter [facility])    Rx / [place] Orders ED Discharge Orders    None       Davonna Belling, MD 08/04/20 1132

## 2020-08-04 NOTE — ED Notes (Signed)
Trauma at bedside.

## 2020-08-04 NOTE — ED Triage Notes (Addendum)
Pt brought to ED via EMS from [facility] for fall on Plavix. Per EMS, pt fell last night around 11PM and had 5 staples placed. Pt fell again this morning at unknown time and was found in the floor by staff. Laceration at base of skull, 3 cm approx. Hx dementia. Pt alert on arrival to ED, answering questions. C-collar in place.  EMS v/s: 92/60 BP initially, 500 mL NS given; BP increased to 130/64 70 pulse 94% room air 160 CBG

## 2020-08-04 NOTE — ED Notes (Signed)
C-collar removed by EDP.   

## 2020-08-04 NOTE — ED Triage Notes (Signed)
Patient arrives from [facility] with [facility], staff reported that they had just checked on him and he was in bed, when they came back, patient was on the floor with blood around his head, large laceration to back of head, bleeding controlled, patient on Plavix, per facility, patient is at baseline mental status.

## 2020-08-04 NOTE — Consult Note (Signed)
Providing Compassionate, Quality Care - Together   Reason for Consult: SDH, L4 compression fracture Referring Physician: Saverio Danker, PA  Sean Lozano is an 87 y.o. male.  HPI: Patient is an 87 year old male with a history significant for dementia, frequent falls, CAD (on Plavix), hyperlipidemia, hypertension, and osteoarthritis. He has a history of a multilevel cervical fusion. The patient resides at a memory care facility. He suffered a fall late last night, where he suffered a scalp laceration. His CT imaging of his head and neck were negative. His scalp laceration was repaired and he returned to his skilled nursing facility. Upon return to the facility, he fell again and was brought back to the emergency department. Imaging this time revealed a new small right frontal parafalcine subdural hematoma and a new small subdural hematoma layering along the left tentorium. There was no significant mass effect and no midline shift. His CT abdomen/pelvis showed what looks like an L4 compression fracture. Neurosurgery was consulted for further evaluation and recommendation.  Past Medical History:  Diagnosis Date  . BPH (benign prostatic hyperplasia)   . CAD (coronary artery disease)   . Dementia ([place])   . Frequent falls   . GERD (gastroesophageal reflux disease)   . Hyperlipemia   . Hypertension   . Osteoarthritis     Past Surgical History:  Procedure Laterality Date  . CORONARY ANGIOPLASTY WITH STENT PLACEMENT    . LEG SURGERY     for a fracture    No family history on file.  Social History:  reports that he has never smoked. He has never used smokeless tobacco. He reports previous alcohol use. He reports that he does not use drugs.  Allergies:  Allergies  Allergen Reactions  . Atenolol Other (See Comments)    Listed on MAR  . Codeine Other (See Comments)    Listed on MAR  . Ondansetron Other (See Comments)    Listed on MAR  . Rosuvastatin Other (See Comments)    Listed  on MAR  . Simvastatin Other (See Comments)    Listed on MAR  . Tramadol Other (See Comments)    Listed on MAR  . Zetia [Ezetimibe] Other (See Comments)    Listed on MAR    Medications: I have reviewed the patient's current medications.  Results for orders placed or performed during the hospital encounter of 08/04/20 (from the past 48 hour(s))  Comprehensive metabolic panel     Status: Abnormal   Collection Time: 08/04/20  8:00 AM  Result Value Ref Range   Sodium 135 135 - 145 mmol/L   Potassium 4.1 3.5 - 5.1 mmol/L   Chloride 107 98 - 111 mmol/L   CO2 22 22 - 32 mmol/L   Glucose, Bld 122 (H) 70 - 99 mg/dL    Comment: Glucose reference range applies only to samples taken after fasting for at least 8 hours.   BUN 18 8 - 23 mg/dL   Creatinine, Ser 1.13 0.61 - 1.24 mg/dL   Calcium 8.5 (L) 8.9 - 10.3 mg/dL   Total Protein 5.9 (L) 6.5 - 8.1 g/dL   Albumin 3.2 (L) 3.5 - 5.0 g/dL   AST 33 15 - 41 U/L   ALT 20 0 - 44 U/L   Alkaline Phosphatase 45 38 - 126 U/L   Total Bilirubin 1.2 0.3 - 1.2 mg/dL   GFR, Estimated >60 >60 mL/min    Comment: (NOTE) Calculated using the CKD-EPI Creatinine Equation (2021)    Anion  gap 6 5 - 15    Comment: Performed at [facility], [place] [address] [place], [place] [address]  CBC     Status: Abnormal   Collection Time: 08/04/20  8:00 AM  Result Value Ref Range   WBC 10.9 (H) 4.0 - 10.5 K/uL   RBC 3.38 (L) 4.22 - 5.81 MIL/uL   Hemoglobin 11.5 (L) 13.0 - 17.0 g/dL   HCT 36.4 (L) 39.0 - 52.0 %   MCV 107.7 (H) 80.0 - 100.0 fL   MCH 34.0 26.0 - 34.0 pg   MCHC 31.6 30.0 - 36.0 g/dL   RDW 13.2 11.5 - 15.5 %   Platelets 153 150 - 400 K/uL   nRBC 0.0 0.0 - 0.2 %    Comment: Performed at [facility], [place] [address], [place], [place] [address]  Resp Panel by RT-PCR (Flu A&B, Covid) Nasopharyngeal Swab     Status: None   Collection Time: 08/04/20 11:34 AM   Specimen: Nasopharyngeal Swab; Nasopharyngeal(NP) swabs in vial transport medium   Result Value Ref Range   SARS Coronavirus 2 by RT PCR NEGATIVE NEGATIVE    Comment: (NOTE) SARS-CoV-2 target nucleic acids are NOT DETECTED.  The SARS-CoV-2 RNA is generally detectable in upper respiratory specimens during the acute phase of infection. The lowest concentration of SARS-CoV-2 viral copies this assay can detect is 138 copies/mL. A negative result does not preclude SARS-Cov-2 infection and should not be used as the sole basis for treatment or other patient management decisions. A negative result may occur with  improper specimen collection/handling, submission of specimen other than nasopharyngeal swab, presence of viral mutation(s) within the areas targeted by this assay, and inadequate number of viral copies(<138 copies/mL). A negative result must be combined with clinical observations, patient history, and epidemiological information. The expected result is Negative.  Fact Sheet for Patients:  EntrepreneurPulse.com.au  Fact Sheet for Healthcare Providers:  IncredibleEmployment.be  This test is no t yet approved or cleared by the [place] FDA and  has been authorized for detection and/or diagnosis of SARS-CoV-2 by FDA under an Emergency Use Authorization (EUA). This EUA will remain  in effect (meaning this test can be used) for the duration of the COVID-19 declaration under Section 564(b)(1) of the Act, 21 U.S.C.section 360bbb-3(b)(1), unless the authorization is terminated  or revoked sooner.       Influenza A by PCR NEGATIVE NEGATIVE   Influenza B by PCR NEGATIVE NEGATIVE    Comment: (NOTE) The Xpert Xpress SARS-CoV-2/FLU/RSV plus assay is intended as an aid in the diagnosis of influenza from Nasopharyngeal swab specimens and should not be used as a sole basis for treatment. Nasal washings and aspirates are unacceptable for Xpert Xpress SARS-CoV-2/FLU/RSV testing.  Fact Sheet for  Patients: EntrepreneurPulse.com.au  Fact Sheet for Healthcare Providers: IncredibleEmployment.be  This test is not yet approved or cleared by the [place] FDA and has been authorized for detection and/or diagnosis of SARS-CoV-2 by FDA under an Emergency Use Authorization (EUA). This EUA will remain in effect (meaning this test can be used) for the duration of the COVID-19 declaration under Section 564(b)(1) of the Act, 21 U.S.C. section 360bbb-3(b)(1), unless the authorization is terminated or revoked.  Performed at [facility], [place] [address], [place],  [address]     CT Head Wo Contrast  Result Date: 08/04/2020 CLINICAL DATA:  Fall on anticoagulation. EXAM: CT HEAD WITHOUT CONTRAST TECHNIQUE: Contiguous axial images were obtained from the base of the  skull through the vertex without intravenous contrast. COMPARISON:  Head CT from earlier today. FINDINGS: Brain: New small right frontal parafalcine subdural hematoma measuring 3 mm thickness superiorly. New small subdural hematoma layering along the left tentorium. No evidence of parenchymal hemorrhage. No mass lesion, mass effect, or midline shift. No CT evidence of acute infarction. Generalized cerebral volume loss. Nonspecific moderate subcortical and periventricular white matter hypodensity, most in keeping with chronic small vessel ischemic change. Cerebral ventricle sizes are stable and concordant with the degree of cerebral volume loss. Vascular: No acute abnormality. Skull: No evidence of calvarial fracture. Scalp staples noted at the posterior left vertex. Sinuses/Orbits: The visualized paranasal sinuses are essentially clear. Other:  The mastoid air cells are unopacified. IMPRESSION: 1. New small right frontal parafalcine subdural hematoma. New small subdural hematoma layering along the left tentorium. No significant mass effect. No midline shift. 2. Generalized cerebral volume loss  and moderate chronic small vessel ischemic changes in the cerebral white matter. Critical Value/emergent results were called by telephone at the time of interpretation on 08/04/2020 at 8:31 am to provider [facility] , who verbally acknowledged these results. Electronically Signed   By: Ilona Sorrel M.D.   On: 08/04/2020 08:33   CT Cervical Spine Wo Contrast  Result Date: 08/04/2020 CLINICAL DATA:  Recurrent fall on anticoagulation. EXAM: CT CERVICAL SPINE WITHOUT CONTRAST TECHNIQUE: Multidetector CT imaging of the cervical spine was performed without intravenous contrast. Multiplanar CT image reconstructions were also generated. COMPARISON:  Cervical spine CT from earlier today. FINDINGS: Alignment: Straightening of the cervical spine. No facet subluxation. Dens is well positioned between the lateral masses of C1. Skull base and vertebrae: No acute fracture. No primary bone lesion or focal pathologic process. Soft tissues and spinal canal: No prevertebral edema. No visible canal hematoma. Disc levels: Status post ACDF C4-6 with no hardware fracture or loosening. Bony fusion across the C3-4, C4-5 and C5-6 disc spaces. Marked degenerative disc disease throughout the cervical spine. Multilevel moderate effacement of the anterior thecal sac by posterior disc osteophyte complex, most prominent C6-7. Advanced bilateral facet arthropathy. Moderate degenerative foraminal stenosis bilaterally at C3-4 and C4-5, on the left at C5-6 and C6-7 and on the right at C7-T1. Upper chest: No acute abnormality. Other: Visualized mastoid air cells appear clear. No discrete thyroid nodules. No pathologically enlarged cervical nodes. IMPRESSION: 1. No cervical spine fracture or subluxation. 2. Status post ACDF C4-6 with no hardware complication. Bony fusion across the C3-4, C4-5 and C5-6 disc spaces. 3. Marked degenerative changes throughout the cervical spine as detailed. Electronically Signed   By: Ilona Sorrel M.D.   On:  08/04/2020 08:40   CT ABDOMEN PELVIS W CONTRAST  Result Date: 08/04/2020 CLINICAL DATA:  87 year old male status post 2 falls in the past 24 hours. On blood thinners. Dementia. EXAM: CT ABDOMEN AND PELVIS WITH CONTRAST TECHNIQUE: Multidetector CT imaging of the abdomen and pelvis was performed using the standard protocol following bolus administration of intravenous contrast. CONTRAST:  149mL OMNIPAQUE IOHEXOL 300 MG/ML  SOLN COMPARISON:  No prior abdominal or pelvis imaging. FINDINGS: Lower chest: Evidence of prior CABG. No cardiomegaly or pericardial effusion. Patchy bilateral lung base opacity most resembles atelectasis and/or scarring. Hepatobiliary: Cholelithiasis. Otherwise negative gallbladder. Negative liver. Pancreas: Partially atrophied, negative. Spleen: Negative. Adrenals/Urinary Tract: Normal adrenal glands. Simple fluid density benign appearing left renal upper pole cyst. Symmetric renal enhancement and contrast excretion. No hydroureter. Although there is mild nonspecific inflammatory stranding at the right pelvic inlet along the course  of the right ureter seen on series 3, image 65. The bladder dome and decompressed small bowel loops are also nearby. The bladder is moderately distended (280 mL) but otherwise unremarkable. Stomach/Bowel: Moderate retained stool in the rectosigmoid colon. Redundant sigmoid with diverticulosis. Redundant transverse colon. Diverticulosis in the right colon. No large bowel inflammation. Diminutive or absent appendix. Decompressed and negative terminal ileum. No dilated small bowel. Fat containing umbilical hernia. Negative stomach and duodenum. No free air or free fluid. Vascular/Lymphatic: Extensive Aortoiliac calcified atherosclerosis. Major arterial structures remain patent. Patent portal venous system. No lymphadenopathy. Reproductive: Negative. Other: Nonspecific mild presacral stranding.  No pelvic free fluid. Musculoskeletal: Prior sternotomy. Flowing  osteophytes in the lower thoracic spine resulting in multilevel ankylosis. Levoconvex lumbar scoliosis. Mild L4 superior endplate compression fracture with acute appearing cortical step-off on coronal image 75. Minimal loss of height. No retropulsion. No complicating features. Other lumbar levels appear intact. Sacrum, SI joints, pelvis and proximal femurs appear intact. Asymmetric left hip joint degeneration. Coccyx is comminuted, although this is age indeterminate. Furthermore there is indistinct soft tissue swelling and stranding along the posteroinferior gluteus muscles, posterior to the right ischium (series 3, image 93). And there is possibly a focus of intramuscular venous hemorrhage on series 3, image 98. No other superficial soft tissue injury identified. IMPRESSION: 1. Mild L4 superior endplate compression fracture appears acute with no retropulsion or complicating features. 2. Intramuscular hematoma with possible active venous bleeding along the inferior right gluteus, posterior to the right ischium. Nearby fracture of the coccyx, age indeterminate but probably acute. 3. Small area of nonspecific fat/mesenteric stranding at the right pelvic inlet. Difficult to exclude mesenteric contusion here, although the adjacent right ureter and small bowel appear to remain normal. 4. No other acute traumatic injury identified in the abdomen or pelvis. 5. Cholelithiasis. Diverticulosis of the colon. Aortic Atherosclerosis (ICD10-I70.0). Fat containing umbilical hernia. Electronically Signed   By: Genevie Ann M.D.   On: 08/04/2020 11:16    Review of Systems  Unable to perform ROS: Dementia  Patient denies pain when asked.  Blood pressure (!) 114/95, pulse 66, temperature (!) 97.5 F (36.4 C), temperature source Oral, resp. rate 14, height 6' (1.829 m), weight 82.6 kg, SpO2 96 %. Estimated body mass index is 24.68 kg/m as calculated from the following:   Height as of this encounter: 6' (1.829 m).   Weight as of  this encounter: 82.6 kg.  Physical Exam Constitutional:      Appearance: He is normal weight.  HENT:     Head: Normocephalic.     Comments: Repaired laceration    Nose: Nose normal.     Mouth/Throat:     Mouth: Mucous membranes are moist.     Pharynx: Oropharynx is clear.  Eyes:     Conjunctiva/sclera: Conjunctivae normal.     Pupils: Pupils are equal, round, and reactive to light.  Cardiovascular:     Rate and Rhythm: Normal rate and regular rhythm.     Pulses: Normal pulses.  Pulmonary:     Effort: Pulmonary effort is normal. No respiratory distress.  Abdominal:     General: Abdomen is flat.     Palpations: Abdomen is soft.  Musculoskeletal:        General: Normal range of motion.     Cervical back: Normal range of motion and neck supple.  Skin:    General: Skin is warm and dry.     Capillary Refill: Capillary refill takes less than 2 seconds.  Neurological:     Mental Status: He is alert. Mental status is at baseline. He is disoriented.  Psychiatric:        Attention and Perception: He is inattentive.        Behavior: Behavior is agitated.        Cognition and Memory: Memory is impaired.     Assessment/Plan: Patient suffered two falls within 12 hours, sustaining small right frontal parafalcine subdural hematoma and a new small subdural hematoma layering along the left tentorium. His L4 vertebra appears to be more degenerative in nature than an acute compression fracture. If patient complains of low back pain, a lumbosacral corset can be ordered. Recommend holding Plavix for at least 2 weeks. There is no needs to reimage unless there is a neurological decline.   Viona Gilmore, DNP, AGNP-C Nurse Practitioner  [facility] [place] [address] [place] [address], [place], [place] [address] P: [phone]    F: [phone]  08/04/2020, 4:18 PM

## 2020-08-04 NOTE — ED Notes (Signed)
Patients son remains at beside. Patient sleeping at this time. Easily aroused by voice, responsive.

## 2020-08-04 NOTE — Progress Notes (Addendum)
Brief Palliative Medicine Progress Note:  PMT consult received and chart reviewed. [name] was completed with HCPOA/son/[name] 08/04/20. Full PMT Consult note to follow.  SUMMARY OF RECOMMENDATIONS:  Continue gentle medical interventions without invasive procedures  Initiated DNR/DNI -durable DNR form completed and placed in shadow chart; copy was made and will be scanned into the [facility]/ACP tab  Plan is for patient to return to [name] with outpatient palliative care.  Son works with a Naval architect who may have already placed outpatient palliative care referral -he is going to follow-up on this tonight. PMT will follow up with son tomorrow 4/26  Son does not wish for heroic measures, if patient declines he is open to further [name] discussions for transition to comfort measures  Please obtain copy of HCPOA and placed in shadow chart if able  PMT will continue to follow and support holistically   Thank you for allowing PMT to assist in the care of this patient.  [name] Palliative Medicine Team Team Phone: [phone] NO CHARGE

## 2020-08-04 NOTE — ED Provider Notes (Signed)
[facility] EMERGENCY DEPARTMENT Provider Note   CSN: [id number] Arrival date & time: 08/04/20  0010     History Chief Complaint  Patient presents with  . Level 2  . Fall    Sean Lozano is a 87 y.o. male.  Patient is an 87 year old male with past medical history of dementia coronary artery disease with CABG.  Patient brought from his extended care facility for evaluation of fall.  Patient was checked on and seemed well, then 1 hour later was found on the floor bleeding from his head.  It appears as though he struck his head on the nightstand when he fell.  Patient has little additional history secondary to dementia.  He does take Plavix and arrived here as a level 2 trauma.  The history is provided by the patient.  Fall This is a new problem. The current episode started less than 1 hour ago. The problem occurs constantly. The problem has not changed since onset.Nothing aggravates the symptoms. Nothing relieves the symptoms. He has tried nothing for the symptoms.       No past medical history on file.  There are no problems to display for this patient.        No family history on file.     Home Medications Prior to Admission medications   Not on File    Allergies    Patient has no allergy information on record.  Review of Systems   Review of Systems  All other systems reviewed and are negative.   Physical Exam Updated Vital Signs BP 138/82   Pulse 70   Temp 98.3 F (36.8 C) (Axillary)   Resp 15   Ht 5\' 11"  (1.803 m)   Wt 95.3 kg   SpO2 94%   BMI 29.29 kg/m   Physical Exam Vitals and nursing note reviewed.  Constitutional:      General: He is not in acute distress.    Appearance: He is well-developed. He is not diaphoretic.  HENT:     Head: Normocephalic.  Eyes:     Extraocular Movements: Extraocular movements intact.     Pupils: Pupils are equal, round, and reactive to light.  Cardiovascular:     Rate and Rhythm: Normal  rate and regular rhythm.     Heart sounds: No murmur heard. No friction rub.  Pulmonary:     Effort: Pulmonary effort is normal. No respiratory distress.     Breath sounds: Normal breath sounds. No wheezing or rales.  Abdominal:     General: Bowel sounds are normal. There is no distension.     Palpations: Abdomen is soft.     Tenderness: There is no abdominal tenderness.  Musculoskeletal:        General: Normal range of motion.     Cervical back: Normal range of motion and neck supple.     Comments: Bilateral hips are nontender.  He has good range of motion with no discomfort.  DP pulses are easily palpable and motor and sensation intact throughout the feet.  Skin:    General: Skin is warm and dry.  Neurological:     General: No focal deficit present.     Mental Status: He is alert and oriented to person, place, and time.     Cranial Nerves: No cranial nerve deficit.     Sensory: No sensory deficit.     Motor: No weakness.     Coordination: Coordination normal.     ED Results /  Procedures / Treatments   Labs (all labs ordered are listed, but only abnormal results are displayed) Labs Reviewed  BASIC METABOLIC PANEL  CBC WITH DIFFERENTIAL/PLATELET  PROTIME-INR    EKG None  Radiology No results found.  Procedures Procedures   Medications Ordered in ED Medications - No data to display  ED Course  I have reviewed the triage vital signs and the nursing notes.  Pertinent labs & imaging results that were available during my care of the patient were reviewed by me and considered in my medical decision making (see chart for details).    MDM Rules/Calculators/A&P  Patient is an 87 year old male brought by EMS after a fall at his extended care facility.  Patient apparently attempted to get out of bed, fell, then struck his head on the nightstand.  He had a laceration to the top of his head with significant bleeding.  Has dementia and does not add much additional history,  but denies pain other than the pain to the top of his head.  He arrived here with a dressing in place which continued to bleed.  The dressing was removed and patient was found to have a 3.5 cm laceration to the top of the head with arterial bleeding from a small artery.  Patient was given lidocaine with epinephrine, then staples were placed with good hemostasis.  Patient then sent for CT scan of the head and cervical spine, both of which were unremarkable.    Patient had a brief episode of hypotension, the etiology of which I am uncertain.  His abdomen is benign and I see no other overt signs of trauma.  He was given 500 cc of normal saline and his blood pressures are again in the normal range.  Laboratory studies are unremarkable and patient seems appropriate for discharge at this time.  To return as needed for any problems.  Staples are to be removed in 1 week.  Final Clinical Impression(s) / ED Diagnoses Final diagnoses:  None    Rx / DC Orders ED Discharge Orders    None       Veryl Speak, MD 08/04/20 [phone]

## 2020-08-04 NOTE — ED Notes (Signed)
Patient transported to CT 

## 2020-08-05 DIAGNOSIS — S300XXA Contusion of lower back and pelvis, initial encounter: Secondary | ICD-10-CM | POA: Diagnosis not present

## 2020-08-05 DIAGNOSIS — S065X9A Traumatic subdural hemorrhage with loss of consciousness of unspecified duration, initial encounter: Principal | ICD-10-CM

## 2020-08-05 DIAGNOSIS — W19XXXA Unspecified fall, initial encounter: Secondary | ICD-10-CM | POA: Diagnosis not present

## 2020-08-05 DIAGNOSIS — S322XXA Fracture of coccyx, initial encounter for closed fracture: Secondary | ICD-10-CM | POA: Diagnosis not present

## 2020-08-05 DIAGNOSIS — Z66 Do not resuscitate: Secondary | ICD-10-CM | POA: Diagnosis not present

## 2020-08-05 DIAGNOSIS — Z789 Other specified health status: Secondary | ICD-10-CM

## 2020-08-05 DIAGNOSIS — S32040A Wedge compression fracture of fourth lumbar vertebra, initial encounter for closed fracture: Secondary | ICD-10-CM | POA: Diagnosis not present

## 2020-08-05 DIAGNOSIS — Z515 Encounter for palliative care: Secondary | ICD-10-CM | POA: Diagnosis not present

## 2020-08-05 DIAGNOSIS — S0101XA Laceration without foreign body of scalp, initial encounter: Secondary | ICD-10-CM | POA: Diagnosis not present

## 2020-08-05 DIAGNOSIS — Z7189 Other specified counseling: Secondary | ICD-10-CM | POA: Diagnosis not present

## 2020-08-05 LAB — CBC
HCT: 31.6 % — ABNORMAL LOW (ref 39.0–52.0)
Hemoglobin: 10.5 g/dL — ABNORMAL LOW (ref 13.0–17.0)
MCH: 33.5 pg (ref 26.0–34.0)
MCHC: 33.2 g/dL (ref 30.0–36.0)
MCV: 101 fL — ABNORMAL HIGH (ref 80.0–100.0)
Platelets: 141 10*3/uL — ABNORMAL LOW (ref 150–400)
RBC: 3.13 MIL/uL — ABNORMAL LOW (ref 4.22–5.81)
RDW: 13 % (ref 11.5–15.5)
WBC: 9.3 10*3/uL (ref 4.0–10.5)
nRBC: 0 % (ref 0.0–0.2)

## 2020-08-05 LAB — BASIC METABOLIC PANEL
Anion gap: 10 (ref 5–15)
BUN: 13 mg/dL (ref 8–23)
CO2: 20 mmol/L — ABNORMAL LOW (ref 22–32)
Calcium: 8.2 mg/dL — ABNORMAL LOW (ref 8.9–10.3)
Chloride: 105 mmol/L (ref 98–111)
Creatinine, Ser: 0.94 mg/dL (ref 0.61–1.24)
GFR, Estimated: >60 mL/min (ref >60)
Glucose, Bld: 96 mg/dL (ref 70–99)
Potassium: 3.6 mmol/L (ref 3.5–5.1)
Sodium: 135 mmol/L (ref 135–145)

## 2020-08-05 MED ORDER — METHOCARBAMOL 500 MG PO TABS
500.0000 mg | ORAL_TABLET | Freq: Three times a day (TID) | ORAL | Status: DC | PRN
Start: 2020-08-05 — End: 2020-08-07

## 2020-08-05 MED ORDER — MORPHINE SULFATE (PF) 2 MG/ML IV SOLN
1.0000 mg | INTRAVENOUS | Status: DC | PRN
  Administered 2020-08-06: 1 mg via INTRAVENOUS
  Filled 2020-08-05: qty 1

## 2020-08-05 NOTE — TOC Initial Note (Signed)
Transition of Care [facility]) - Initial/Assessment Note    Patient Details  Name: Sean Lozano MRN: [id number] Date of Birth: February 29, 1934  Transition of Care [facility]) CM/SW Contact:    Ella Bodo, RN Phone Number: 08/05/2020, 2:21pm Clinical Narrative: Patient is an 87 year old male with a history significant for dementia, frequent falls, CAD (on Plavix), hyperlipidemia, hypertension, and osteoarthritis.  Presenting after two ground level falls at memory care. Imaging reveals a new small right frontal parafalcine subdural hematoma and a new small subdural hematoma layering along the left tentorium, CT abdomen reveals possible L4 compression fx as well as hematoma R glut and ischium.  Prior to admission, patient resided at [facility].  Palliative care met with patient's son, Kreston Ahrendt, today; he desires patient to return to memory care unit with outpatient palliative care referral.  PT/OT/ST recommending skilled nursing facility placement, as he is requiring 2 person assist with all ADLs.  Uncertain if ALF is able to provide the assistance that patient will require; I have left message with Carloyn Jaeger, Mudlogger of memory care unit at [facility].  I have updated son; he is open to skilled nursing facility if this is what patient needs.  Await return call from memory care unit director; will provide updates as available.               Expected Discharge Plan: Skilled Nursing Facility Barriers to Discharge: Continued Medical Work up        Expected Discharge Plan and Services Expected Discharge Plan: [place]   Discharge Planning Services: CM Consult   Living arrangements for the past 2 months: [place] ([place], memory care) Expected Discharge Date: 08/05/20                                    Prior Living Arrangements/Services Living arrangements for the past 2 months: [facility],  memory care) Lives with:: Facility Resident Patient language and need for interpreter reviewed:: Yes        Need for Family Participation in Patient Care: Yes (Comment) Care giver support system in place?: Yes (comment)   Criminal Activity/Legal Involvement Pertinent to Current Situation/Hospitalization: No - Comment as needed  Activities of Daily Living      Permission Sought/Granted                  Emotional Assessment Appearance:: Appears stated age   Affect (typically observed): Unable to Assess Orientation: : Oriented to Self      Admission diagnosis:  Subdural hematoma ([place]) [S06.5X9A] SDH (subdural hematoma) ([place]) [S01.0X3A] Fall, initial encounter [W19.XXXA] Traumatic hematoma of buttock, initial encounter [S30.0XXA] Closed fracture of coccyx, initial encounter ([place]) [S32.2XXA] Laceration of scalp, initial encounter [S01.01XA] Compression fracture of L4 vertebra, initial encounter [facility]) [T55.732K] Patient Active Problem List   Diagnosis Date Noted  . SDH (subdural hematoma) ([place]) 08/04/2020   PCP:  Biagio Borg, MD Pharmacy:  No Pharmacies Listed    Social Determinants of Health (SDOH) Interventions    Readmission Risk Interventions No flowsheet data found.  Reinaldo Raddle, RN, BSN  Trauma/Neuro ICU Case Manager [phone]

## 2020-08-05 NOTE — Progress Notes (Addendum)
Subjective: Patient didn't sleep all night according to the RN.  Night RN gave Haldol at [phone] this am and he was completely somnolent for me.  I sternal rubbed him and barely got some moans out of him.  ROS: unable due to above  Objective: Vital signs in last 24 hours: Temp:  [97.8 F (36.6 C)-98.9 F (37.2 C)] 98.3 F (36.8 C) (04/26 0750) Pulse Rate:  [60-85] 82 (04/26 0750) Resp:  [12-21] 16 (04/26 0750) BP: (106-138)/(51-95) 127/75 (04/26 0750) SpO2:  [91 %-100 %] 100 % (04/26 0750) Weight:  [82.6 kg] 82.6 kg (04/25 1005) Last BM Date:  (PTA)  Intake/Output from previous day: 04/25 0701 - 04/26 0700 In: [place] [P.O.:120; I.V.:1620; IV Piggyback:100] Out: 1100 [Urine:1100] Intake/Output this shift: No intake/output data recorded.  PE: Gen: somnolent, laying in bed in NAD HEENT: lacs to back of head are stable with staples Heart: regular Lungs: CTAB Abd: soft, NT, ND Psych: unable  Lab Results:  Recent Labs    08/04/20 0800 08/05/20 0339  WBC 10.9* 9.3  HGB 11.5* 10.5*  HCT 36.4* 31.6*  PLT 153 141*   BMET Recent Labs    08/04/20 0800 08/05/20 0339  NA 135 135  K 4.1 3.6  CL 107 105  CO2 22 20*  GLUCOSE 122* 96  BUN 18 13  CREATININE 1.13 0.94  CALCIUM 8.5* 8.2*   PT/INR No results for input(s): LABPROT, INR in the last 72 hours. CMP     Component Value Date/Time   NA 135 08/05/2020 0339   K 3.6 08/05/2020 0339   CL 105 08/05/2020 0339   CO2 20 (L) 08/05/2020 0339   GLUCOSE 96 08/05/2020 0339   BUN 13 08/05/2020 0339   CREATININE 0.94 08/05/2020 0339   CALCIUM 8.2 (L) 08/05/2020 0339   PROT 5.9 (L) 08/04/2020 0800   ALBUMIN 3.2 (L) 08/04/2020 0800   AST 33 08/04/2020 0800   ALT 20 08/04/2020 0800   ALKPHOS 45 08/04/2020 0800   BILITOT 1.2 08/04/2020 0800   GFRNONAA >60 08/05/2020 0339   Lipase  No results found for: LIPASE     Studies/Results: CT Head Wo Contrast  Result Date: 08/04/2020 CLINICAL DATA:  Fall on  anticoagulation. EXAM: CT HEAD WITHOUT CONTRAST TECHNIQUE: Contiguous axial images were obtained from the base of the skull through the vertex without intravenous contrast. COMPARISON:  Head CT from earlier today. FINDINGS: Brain: New small right frontal parafalcine subdural hematoma measuring 3 mm thickness superiorly. New small subdural hematoma layering along the left tentorium. No evidence of parenchymal hemorrhage. No mass lesion, mass effect, or midline shift. No CT evidence of acute infarction. Generalized cerebral volume loss. Nonspecific moderate subcortical and periventricular white matter hypodensity, most in keeping with chronic small vessel ischemic change. Cerebral ventricle sizes are stable and concordant with the degree of cerebral volume loss. Vascular: No acute abnormality. Skull: No evidence of calvarial fracture. Scalp staples noted at the posterior left vertex. Sinuses/Orbits: The visualized paranasal sinuses are essentially clear. Other:  The mastoid air cells are unopacified. IMPRESSION: 1. New small right frontal parafalcine subdural hematoma. New small subdural hematoma layering along the left tentorium. No significant mass effect. No midline shift. 2. Generalized cerebral volume loss and moderate chronic small vessel ischemic changes in the cerebral white matter. Critical Value/emergent results were called by telephone at the time of interpretation on 08/04/2020 at 8:31 am to provider [facility] , who verbally acknowledged these results. Electronically Signed  By: Ilona Sorrel M.D.   On: 08/04/2020 08:33   CT Cervical Spine Wo Contrast  Result Date: 08/04/2020 CLINICAL DATA:  Recurrent fall on anticoagulation. EXAM: CT CERVICAL SPINE WITHOUT CONTRAST TECHNIQUE: Multidetector CT imaging of the cervical spine was performed without intravenous contrast. Multiplanar CT image reconstructions were also generated. COMPARISON:  Cervical spine CT from earlier today. FINDINGS: Alignment:  Straightening of the cervical spine. No facet subluxation. Dens is well positioned between the lateral masses of C1. Skull base and vertebrae: No acute fracture. No primary bone lesion or focal pathologic process. Soft tissues and spinal canal: No prevertebral edema. No visible canal hematoma. Disc levels: Status post ACDF C4-6 with no hardware fracture or loosening. Bony fusion across the C3-4, C4-5 and C5-6 disc spaces. Marked degenerative disc disease throughout the cervical spine. Multilevel moderate effacement of the anterior thecal sac by posterior disc osteophyte complex, most prominent C6-7. Advanced bilateral facet arthropathy. Moderate degenerative foraminal stenosis bilaterally at C3-4 and C4-5, on the left at C5-6 and C6-7 and on the right at C7-T1. Upper chest: No acute abnormality. Other: Visualized mastoid air cells appear clear. No discrete thyroid nodules. No pathologically enlarged cervical nodes. IMPRESSION: 1. No cervical spine fracture or subluxation. 2. Status post ACDF C4-6 with no hardware complication. Bony fusion across the C3-4, C4-5 and C5-6 disc spaces. 3. Marked degenerative changes throughout the cervical spine as detailed. Electronically Signed   By: Ilona Sorrel M.D.   On: 08/04/2020 08:40   CT ABDOMEN PELVIS W CONTRAST  Result Date: 08/04/2020 CLINICAL DATA:  87 year old male status post 2 falls in the past 24 hours. On blood thinners. Dementia. EXAM: CT ABDOMEN AND PELVIS WITH CONTRAST TECHNIQUE: Multidetector CT imaging of the abdomen and pelvis was performed using the standard protocol following bolus administration of intravenous contrast. CONTRAST:  160mL OMNIPAQUE IOHEXOL 300 MG/ML  SOLN COMPARISON:  No prior abdominal or pelvis imaging. FINDINGS: Lower chest: Evidence of prior CABG. No cardiomegaly or pericardial effusion. Patchy bilateral lung base opacity most resembles atelectasis and/or scarring. Hepatobiliary: Cholelithiasis. Otherwise negative gallbladder. Negative  liver. Pancreas: Partially atrophied, negative. Spleen: Negative. Adrenals/Urinary Tract: Normal adrenal glands. Simple fluid density benign appearing left renal upper pole cyst. Symmetric renal enhancement and contrast excretion. No hydroureter. Although there is mild nonspecific inflammatory stranding at the right pelvic inlet along the course of the right ureter seen on series 3, image 65. The bladder dome and decompressed small bowel loops are also nearby. The bladder is moderately distended (280 mL) but otherwise unremarkable. Stomach/Bowel: Moderate retained stool in the rectosigmoid colon. Redundant sigmoid with diverticulosis. Redundant transverse colon. Diverticulosis in the right colon. No large bowel inflammation. Diminutive or absent appendix. Decompressed and negative terminal ileum. No dilated small bowel. Fat containing umbilical hernia. Negative stomach and duodenum. No free air or free fluid. Vascular/Lymphatic: Extensive Aortoiliac calcified atherosclerosis. Major arterial structures remain patent. Patent portal venous system. No lymphadenopathy. Reproductive: Negative. Other: Nonspecific mild presacral stranding.  No pelvic free fluid. Musculoskeletal: Prior sternotomy. Flowing osteophytes in the lower thoracic spine resulting in multilevel ankylosis. Levoconvex lumbar scoliosis. Mild L4 superior endplate compression fracture with acute appearing cortical step-off on coronal image 75. Minimal loss of height. No retropulsion. No complicating features. Other lumbar levels appear intact. Sacrum, SI joints, pelvis and proximal femurs appear intact. Asymmetric left hip joint degeneration. Coccyx is comminuted, although this is age indeterminate. Furthermore there is indistinct soft tissue swelling and stranding along the posteroinferior gluteus muscles, posterior to the right ischium (series  3, image 93). And there is possibly a focus of intramuscular venous hemorrhage on series 3, image 98. No other  superficial soft tissue injury identified. IMPRESSION: 1. Mild L4 superior endplate compression fracture appears acute with no retropulsion or complicating features. 2. Intramuscular hematoma with possible active venous bleeding along the inferior right gluteus, posterior to the right ischium. Nearby fracture of the coccyx, age indeterminate but probably acute. 3. Small area of nonspecific fat/mesenteric stranding at the right pelvic inlet. Difficult to exclude mesenteric contusion here, although the adjacent right ureter and small bowel appear to remain normal. 4. No other acute traumatic injury identified in the abdomen or pelvis. 5. Cholelithiasis. Diverticulosis of the colon. Aortic Atherosclerosis (ICD10-I70.0). Fat containing umbilical hernia. Electronically Signed   By: Genevie Ann M.D.   On: 08/04/2020 11:16    Anti-infectives: Anti-infectives (From admission, onward)   None       Assessment/Plan GLF R SDH - NSGY c/s, Dr. Annette Stable, keppra x7d for sz ppx, no follow up scan needed unless clinical status changes Scalp lacerations - staples placed by EDP, remove in 10 days L4 endplate fx - NSGY c/s, Dr. Annette Stable, may be more chronic than acute.  Lumbosacral corset prn if he complains of pain. R gluteal hematoma with extrav - hgb 10.5 from 11.5 yesterday CAD - hold plavix for 2 weeks, but sees cardiologist next week and d/w son about the need to discuss cessation of plavix.   Dementia - resume home meds, sun downs significantly per son. Prn haldol ordered as well.  Palliative care consult to discuss [place] BLE edema - son states it is not related to his heart, resume home lasix dose. FEN - CLD, SLP for swallow.  Can have whatever diet he passes for DVT - SCDs, hold chemical ppx  Dispo - progressive, therapies, palliative care consult, ?dispo to SNF from memory care unit   LOS: 1 day    Henreitta Cea , [facility] 08/05/2020, 9:20 AM Please see Amion for pager number during day  hours 7:00am-4:30pm or 7:00am -11:30am on weekends

## 2020-08-05 NOTE — Evaluation (Signed)
Occupational Therapy Evaluation Patient Details Name: Sean Lozano MRN: [id number] DOB: 1933-08-15 Today's Date: 08/05/2020    History of Present Illness Patient is an 87 year old male with a history significant for dementia, frequent falls, CAD (on Plavix), hyperlipidemia, hypertension, and osteoarthritis.  Presenting after two ground level falls at memory care. Imaging reveals a new small right frontal parafalcine subdural hematoma and a new small subdural hematoma layering along the left tentorium, CT abdomen reveals possible L4 compression fx as well as hematoma R glut and ischium.   Clinical Impression   Patient admitted for above and limited by problem list below, including pain, decreased activity tolerance, impaired balance, impaired cognition.  He is from [place] and unable to report PLOF, but per chart review using RW and I anticipate pt needing some assist with ADLs. Currently requires max-total assist for ADLs and max assist +2 for sit to stand transfers.  He is able to follow 1 step commands with increased time but inconsistent, disoriented and requires constant re-direction throughout session. He will benefit from further OT services while admitted and after dc at SNF level to optimize return to PLOF.  Will follow.     Follow Up Recommendations  SNF;Supervision/Assistance - 24 hour    Equipment Recommendations  Other (comment) (TBD at next venue of care)    Recommendations for Other Services       Precautions / Restrictions Precautions Precautions: Fall Restrictions Weight Bearing Restrictions: No      Mobility Bed Mobility Overal bed mobility: Needs Assistance Bed Mobility: Supine to Sit;Sit to Supine     Supine to sit: Min assist;+2 for safety/equipment Sit to supine: Max assist;+2 for safety/equipment;+2 for physical assistance   General bed mobility comments: transitioned to EOB with min assist for trunk and scooting forward (pt initating movement of LEs  but roling thearpist and reaching for UE support to ascend), max assist +2 to return supine    Transfers Overall transfer level: Needs assistance Equipment used: 2 person hand held assist Transfers: Sit to/from Stand Sit to Stand: Max assist;+2 safety/equipment;+2 physical assistance         General transfer comment: to power up and steady, increased time and effort required    Balance Overall balance assessment: Needs assistance Sitting-balance support: No upper extremity supported;Feet supported Sitting balance-Leahy Scale: Fair Sitting balance - Comments: min guard for safety   Standing balance support: Bilateral upper extremity supported;During functional activity Standing balance-Leahy Scale: Poor Standing balance comment: relies on external support                           ADL either performed or assessed with clinical judgement   ADL Overall ADL's : Needs assistance/impaired     Grooming: Minimal assistance;Sitting;Wash/dry face Grooming Details (indicate cue type and reason): requires HOH support for initation, but able to complete with increased time Upper Body Bathing: Sitting;Maximal assistance   Lower Body Bathing: Sit to/from stand;Maximal assistance   Upper Body Dressing : Maximal assistance;Sitting   Lower Body Dressing: Total assistance;Sit to/from Health and safety inspector Details (indicate cue type and reason): deferred         Functional mobility during ADLs: Maximal assistance;+2 for physical assistance;Cueing for safety;Cueing for sequencing General ADL Comments: pt limited by cognition, pain and activity tolerance     Vision   Additional Comments: further assessment required     Perception     Praxis  Pertinent Vitals/Pain Pain Assessment: Faces Faces Pain Scale: Hurts little more Pain Location: "all over" Pain Descriptors / Indicators: Discomfort Pain Intervention(s): Limited activity within patient's  tolerance;Monitored during session;Repositioned;RN gave pain meds during session     Hand Dominance     Extremity/Trunk Assessment Upper Extremity Assessment Upper Extremity Assessment: Generalized weakness   Lower Extremity Assessment Lower Extremity Assessment: Defer to PT evaluation       Communication     Cognition Arousal/Alertness: Awake/alert Behavior During Therapy: Restless;Agitated Overall Cognitive Status: History of cognitive impairments - at baseline                                 General Comments: pt with hx of dementia, requires constant reorientation.  Follows simple 1 step commands with increased time, but not consistently   General Comments       Exercises     Shoulder Instructions      Home Living Family/patient expects to be discharged to:: Other (Comment)                                 Additional Comments: Memory Care, [facility]      Prior Functioning/Environment Level of [place]: Needs assistance  Gait / Transfers Assistance Needed: using RW per chart ADL's / Homemaking Assistance Needed: anticipate needing some assist   Comments: pt with hx of dementia and recent falls, unable to provide PLOF but antiicpate some assistance required for ADLs        OT Problem List: Decreased strength;Decreased activity tolerance;Impaired balance (sitting and/or standing);Decreased cognition;Decreased safety awareness;Pain;Decreased knowledge of precautions;Decreased knowledge of use of DME or AE      OT Treatment/Interventions: Self-care/ADL training;DME and/or AE instruction;Therapeutic activities;Cognitive remediation/compensation;Patient/family education;Balance training;Therapeutic exercise    OT Goals(Current goals can be found in the care plan section) Acute Rehab OT Goals Patient Stated Goal: none stated OT Goal Formulation: Patient unable to participate in goal setting Time For Goal Achievement:  08/21/20 Potential to Achieve Goals: Good  OT Frequency: Min 2X/week   Barriers to D/C:            Co-evaluation PT/OT/SLP Co-Evaluation/Treatment: Yes Reason for Co-Treatment: Necessary to address cognition/behavior during functional activity;For patient/therapist safety;To address functional/ADL transfers   OT goals addressed during session: ADL's and self-care      AM-PAC OT "6 Clicks" Daily Activity     Outcome Measure Help from another person eating meals?: A Little Help from another person taking care of personal grooming?: A Little Help from another person toileting, which includes using toliet, bedpan, or urinal?: Total Help from another person bathing (including washing, rinsing, drying)?: A Lot Help from another person to put on and taking off regular upper body clothing?: A Lot Help from another person to put on and taking off regular lower body clothing?: A Lot 6 Click Score: 13   End of Session Equipment Utilized During Treatment: Gait belt Nurse Communication: Mobility status;Precautions  Activity Tolerance: Patient tolerated treatment well Patient left: in bed;with call bell/phone within reach;with bed alarm set;Other (comment) (SLP in room, SLP will don mitts)  OT Visit Diagnosis: Other abnormalities of gait and mobility (R26.89);Muscle weakness (generalized) (M62.81);History of falling (Z91.81);Other symptoms and signs involving cognitive function                Time: 1031-1055 OT Time Calculation (min): 24 min Charges:  OT  General Charges $OT Visit: 1 Visit OT Evaluation $OT Eval Moderate Complexity: 1 Mod  Jolaine Artist, OT Acute Rehabilitation Services Pager [phone] Office [phone]   Delight Stare 08/05/2020, 12:40 PM

## 2020-08-05 NOTE — Evaluation (Signed)
Clinical/Bedside Swallow Evaluation Patient Details  Name: Sean Lozano MRN: [id number] Date of Birth: 03/16/1934  Today's Date: 08/05/2020 Time: SLP Start Time (ACUTE ONLY): 21 SLP Stop Time (ACUTE ONLY): 1105 SLP Time Calculation (min) (ACUTE ONLY): 15 min  Past Medical History:  Past Medical History:  Diagnosis Date  . BPH (benign prostatic hyperplasia)   . CAD (coronary artery disease)   . Dementia ([place])   . Frequent falls   . GERD (gastroesophageal reflux disease)   . Hyperlipemia   . Hypertension   . Osteoarthritis    Past Surgical History:  Past Surgical History:  Procedure Laterality Date  . CORONARY ANGIOPLASTY WITH STENT PLACEMENT    . LEG SURGERY     for a fracture   HPI:  Sean Lozano  with past medical history of dementia and CAD presented to the [facility] ED on 08/04/2020 from [facility] after a fall on Plavix.  This is the second fall patient has sustained in a 24-hour timeframe.  Patient originally presented to the ED last night after a fall with injury that required 5 staples to the back of his head.  CT at that time was unremarkable and he was discharged back to [facility].  Unfortunately he sustained another fall with laceration to base of skull and was brought back to the ED. Imaging this time revealed a new small right frontal parafalcine subdural hematoma and a new small subdural hematoma layering along the left tentorium. There was no significant mass effect and no midline shift. His CT abdomen/pelvis showed what looks like an L4 compression fracture. Patient was admitted on 08/04/2020 for observation   Assessment / Plan / Recommendation Clinical Impression  Pt seen for clinical swallow evaluation. Pts eyes remained closed during interaction due to impaired cognition with advanced dementia.  Pt able to verbalize some, though he remains heavily confused. Attempted oral care, pt resistive despite cueing. Pt accepted thin liquids via cup and straw and  puree textures without overt s/sx of aspiration. Trialed small piece of regular texture. Pt blew bolus from oral cavity, with no attempts at mastication. Recommend dysphagia 1 (puree) and thin liquids with medicines crushed in puree and full supervision. Hold POs if mentation is poor. SLP to follow up for diet tolerance and advancement as able. Speech Language Evaluation orders received, due to advanced dementia and plans for likely outpatient hospice care per palliative notes, no cognitive linguistic intervention indicated. SLP Visit Diagnosis: Dysphagia, oral phase (R13.11);Dysphagia, unspecified (R13.10)    Aspiration Risk  Mild aspiration risk;Moderate aspiration risk    Diet Recommendation   Dysphagia 1 (puree) thin liquids   Medication Administration: Crushed with puree    Other  Recommendations Oral Care Recommendations: Oral care BID   Follow up Recommendations Skilled Nursing facility      Frequency and Duration min 2x/week  1 week       Prognosis Prognosis for Safe Diet Advancement: Fair Barriers to Reach Goals: Severity of deficits;Cognitive deficits      Swallow Study   General Date of Onset: 08/04/20 HPI: Sean Lozano  with past medical history of dementia and CAD presented to the [facility] ED on 08/04/2020 from [facility] after a fall on Plavix.  This is the second fall patient has sustained in a 24-hour timeframe.  Patient originally presented to the ED last night after a fall with injury that required 5 staples to the back of his head.  CT at that time was unremarkable and  he was discharged back to [facility].  Unfortunately he sustained another fall with laceration to base of skull and was brought back to the ED. Imaging this time revealed a new small right frontal parafalcine subdural hematoma and a new small subdural hematoma layering along the left tentorium. There was no significant mass effect and no midline shift. His CT abdomen/pelvis showed what looks like  an L4 compression fracture. Patient was admitted on 08/04/2020 for observation Type of Study: Bedside Swallow Evaluation Previous Swallow Assessment: none on file Diet Prior to this Study: Thin liquids Temperature Spikes Noted: No Respiratory Status: Room air History of Recent Intubation: No Behavior/Cognition: Confused;Distractible Oral Cavity Assessment: Dry Oral Care Completed by SLP:  (pt not cooperative to oral care despite cueing) Vision: Impaired for self-feeding Self-Feeding Abilities: Total assist Patient Positioning: Upright in bed Baseline Vocal Quality: Normal Volitional Cough: Cognitively unable to elicit Volitional Swallow: Unable to elicit    Oral/Motor/Sensory Function Overall Oral Motor/Sensory Function: Generalized oral weakness   Ice Chips Ice chips: Not tested   Thin Liquid Thin Liquid: Impaired Presentation: Cup;Straw Oral Phase Functional Implications: Prolonged oral transit Pharyngeal  Phase Impairments: Suspected delayed Swallow;Multiple swallows    Nectar Thick Nectar Thick Liquid: Not tested   Honey Thick     Puree Puree: Within functional limits Presentation: Spoon   Solid     Solid: Impaired Presentation: Spoon Oral Phase Impairments: Reduced lingual movement/coordination;Poor awareness of bolus;Other (comment) (expectoration of bolus)      Sean Lozano. MA, CCC-SLP Acute Rehabilitation Services   08/05/2020,11:20 AM

## 2020-08-05 NOTE — Progress Notes (Signed)
Patient sedated with Haldol earlier this morning.  Remains somnolent but will arouse.  Answer some simple questions.  Follows commands bilaterally but drifts asleep.  Patient with end-stage dementia status post multiple falls.  Patient with small interhemispheric subdural hematoma which is unlikely to worsen.  Given his functional status I do not believe that additional imaging is of value as we would not do anything differently with any new information.  I think the patient situation has gotten to the point where hospice may need to be involved.  I see nothing neurosurgically that I can offer.

## 2020-08-05 NOTE — Progress Notes (Signed)
OT Cancellation Note  Patient Details Name: Sean Lozano MRN: [id number] DOB: 07-21-1933   Cancelled Treatment:    Reason Eval/Treat Not Completed: Fatigue/lethargy limiting ability to participate- per RN patient received Haldol at 6am.  OT to check back later today as able.   Jolaine Artist, OT Acute Rehabilitation Services Pager [phone] Office [phone]   Delight Stare 08/05/2020, 8:53 AM

## 2020-08-05 NOTE — Progress Notes (Signed)
SLP Cancellation Note  Patient Details Name: ERASTO SLEIGHT MRN: [id number] DOB: 1933-11-11   Cancelled treatment:       Reason Eval/Treat Not Completed: Patient's level of consciousness; will follow up as appropriate   Hayden Rasmussen MA, CCC-SLP Acute Rehabilitation Services   08/05/2020, 9:08 AM

## 2020-08-05 NOTE — Evaluation (Signed)
Physical Therapy Evaluation Patient Details Name: Sean Lozano MRN: [id number] DOB: Apr 20, 1933 Today's Date: 08/05/2020   History of Present Illness  Patient is an 87 year old male with a history significant for dementia, frequent falls, CAD (on Plavix), hyperlipidemia, hypertension, and osteoarthritis.  Presenting after two ground level falls at memory care. Imaging reveals a new small right frontal parafalcine subdural hematoma and a new small subdural hematoma layering along the left tentorium, CT abdomen reveals possible L4 compression fx as well as hematoma R glut and ischium.  Clinical Impression  Pt admitted with above diagnosis. Pt sleeping upon PT/OT arrival and agreeable with initial waking but became more agitated the more alert he became. Did not understand where he is or why we are helping him and why he needs help. Relays hurting "all over", RN gave pain meds before session. Pt came to EOB with min HHA and increased time with vc's. Sit to stand with max A +2 with pt unable to lean away from support of bed or wt shift to step feet. Pt is from memory care at [facility] and was ambulatory. He currently needs SNF level care.  Pt currently with functional limitations due to the deficits listed below (see PT Problem List). Pt will benefit from skilled PT to increase their independence and safety with mobility to allow discharge to the venue listed below.       Follow Up Recommendations SNF;Supervision/Assistance - 24 hour    Equipment Recommendations  None recommended by PT    Recommendations for Other Services       Precautions / Restrictions Precautions Precautions: Fall Restrictions Weight Bearing Restrictions: No      Mobility  Bed Mobility Overal bed mobility: Needs Assistance Bed Mobility: Supine to Sit;Sit to Supine     Supine to sit: Min assist;+2 for safety/equipment Sit to supine: Max assist;+2 for safety/equipment;+2 for physical assistance   General bed  mobility comments: transitioned to EOB with min assist for trunk and scooting forward, used HHA and stabilization at knee during elevation of trunk. Pt required increased time and vc's. max assist +2 to return supine    Transfers Overall transfer level: Needs assistance Equipment used: 2 person hand held assist Transfers: Sit to/from Stand Sit to Stand: Max assist;+2 safety/equipment;+2 physical assistance         General transfer comment: to power up and steady, increased time and effort required. Pt unable to bring wt fwd away from bed and unable to maintain standing or wt shift to be able to step feet  Ambulation/Gait             General Gait Details: unable  Stairs            Wheelchair Mobility    Modified Rankin (Stroke Patients Only)       Balance Overall balance assessment: Needs assistance Sitting-balance support: No upper extremity supported;Feet supported Sitting balance-Leahy Scale: Fair Sitting balance - Comments: min guard for safety   Standing balance support: Bilateral upper extremity supported;During functional activity Standing balance-Leahy Scale: Poor Standing balance comment: relies on external support                             Pertinent Vitals/Pain Pain Assessment: Faces Faces Pain Scale: Hurts little more Pain Location: "all over" Pain Descriptors / Indicators: Discomfort Pain Intervention(s): Limited activity within patient's tolerance;Monitored during session;Premedicated before session    Home Living Family/patient expects to be discharged  to:: Other (Comment)                 Additional Comments: Memory Care, [facility]    Prior Function Level of Independence: Needs assistance   Gait / Transfers Assistance Needed: using RW per chart  ADL's / Homemaking Assistance Needed: anticipate needing some assist  Comments: pt with hx of dementia and recent falls, unable to provide PLOF but antiicpate some  assistance required for ADLs     Hand Dominance        Extremity/Trunk Assessment   Upper Extremity Assessment Upper Extremity Assessment: Defer to OT evaluation    Lower Extremity Assessment Lower Extremity Assessment: Generalized weakness    Cervical / Trunk Assessment Cervical / Trunk Assessment: Kyphotic  Communication   Communication: No difficulties  Cognition Arousal/Alertness: Awake/alert;Lethargic Behavior During Therapy: Restless;Agitated Overall Cognitive Status: History of cognitive impairments - at baseline                                 General Comments: pt lethargic initially, more agitated as he became more alert. Did not know where he was or why we were helping him despite frequent orientation.      General Comments      Exercises     Assessment/Plan    PT Assessment Patient needs continued PT services  PT Problem List Decreased strength;Decreased activity tolerance;Decreased balance;Decreased mobility;Decreased cognition;Pain;Decreased knowledge of use of DME;Decreased safety awareness;Decreased knowledge of precautions       PT Treatment Interventions DME instruction;Gait training;Functional mobility training;Therapeutic activities;Therapeutic exercise;Balance training;Neuromuscular re-education;Cognitive remediation;Patient/family education    PT Goals (Current goals can be found in the Care Plan section)  Acute Rehab PT Goals Patient Stated Goal: none stated PT Goal Formulation: Patient unable to participate in goal setting Time For Goal Achievement: 08/19/20 Potential to Achieve Goals: Fair    Frequency Min 3X/week   Barriers to discharge        Co-evaluation PT/OT/SLP Co-Evaluation/Treatment: Yes Reason for Co-Treatment: Complexity of the patient's impairments (multi-system involvement);Necessary to address cognition/behavior during functional activity;For patient/therapist safety PT goals addressed during session:  Mobility/safety with mobility;Balance OT goals addressed during session: ADL's and self-care       AM-PAC PT "6 Clicks" Mobility  Outcome Measure Help needed turning from your back to your side while in a flat bed without using bedrails?: A Little Help needed moving from lying on your back to sitting on the side of a flat bed without using bedrails?: A Little Help needed moving to and from a bed to a chair (including a wheelchair)?: A Lot Help needed standing up from a chair using your arms (e.g., wheelchair or bedside chair)?: A Lot Help needed to walk in hospital room?: Total Help needed climbing 3-5 steps with a railing? : Total 6 Click Score: 12    End of Session Equipment Utilized During Treatment: Gait belt Activity Tolerance: Patient tolerated treatment well Patient left: in bed;with bed alarm set;with call bell/phone within reach;with restraints reapplied (mitts) Nurse Communication: Mobility status PT Visit Diagnosis: Unsteadiness on feet (R26.81);Muscle weakness (generalized) (M62.81);History of falling (Z91.81);Difficulty in walking, not elsewhere classified (R26.2);Pain Pain - part of body:  (generalized)    Time: 3557-3220 PT Time Calculation (min) (ACUTE ONLY): 25 min   Charges:   PT Evaluation $PT Eval Moderate Complexity: 1 Mod          [place]  Pager [phone]  Office [place] 08/05/2020, 1:50 PM

## 2020-08-05 NOTE — TOC CAGE-AID Note (Signed)
Transition of Care [name]) - CAGE-AID Screening   Patient Details  Name: Sean Lozano MRN: [id number] Date of Birth: 1934/03/10  [facility]) CM/SW Contact:    Clovis Cao, RN Phone Number: [phone] 08/05/2020, 1:23 PM   Clinical Narrative: Pt denies alcohol or drug use.   CAGE-AID Screening:    Have You Ever Felt You Ought to Cut Down on Your Drinking or Drug Use?: No Have People Annoyed You By Critizing Your Drinking Or Drug Use?: No Have You Felt Bad Or Guilty About Your Drinking Or Drug Use?: No Have You Ever Had a Drink or Used Drugs First Thing In The Morning to Steady Your Nerves or to Get Rid of a Hangover?: No CAGE-AID Score: 0  Substance Abuse Education Offered: No

## 2020-08-05 NOTE — Progress Notes (Signed)
Daily Progress Note   Patient Name: Sean Lozano       Date: 08/05/2020 DOB: Jul 04, 1933  Age: 87 y.o. MRN#: [id number] Attending Physician: Particia Jasper, MD Primary Care Physician: Biagio Borg, MD Admit Date: 08/04/2020  Reason for Consultation/Follow-up: Establishing goals of care  Subjective: Chart review performed.  Received report from primary RN -no acute concerns.  RN reports patient has not had much change since yesterday -he remains confused, unable to follow commands, poor oral intake, and requiring mitts due to restlessness/agitation.  Went to visit patient at bedside -son/Sean Lozano was present.  Patient was lying in bed asleep -I did not attempt to wake him.  He remained asleep and did not wake for the entirety of my visit and discussion with son. No signs or non-verbal gestures of pain or discomfort noted. No respiratory distress, increased work of breathing, or secretions noted.   Therapeutic listening and emotional support provided to son; provided updates and continued [place] conversations.  Continued discussions regarding outpatient palliative care versus hospice.  Provided education and counseling at length on the philosophy and benefits of hospice care. Discussed that it offers a holistic approach to care in the setting of end-stage illness and is about supporting the patient where they are allowing nature to take it's course. Discussed the hospice team includes RNs, physicians, social workers, and chaplains. They can provide personal care, support for the family, and help keep patient out of the hospital.  After discussion today Sean Lozano does feel that patient would be hospice appropriate as his goal is to keep the patient comfortable and he recognizes that patient has been increasingly  agitated/combative due to his progressive dementia. Sean Lozano understands that patient would not receive physical therapy under hospice care and he is okay with this as he understands patient may not find much benefit from rehabilitation.  Briefly discussed finances in context of ALF and hospice - Sean Lozano tells me that patient's ALF is covered by long-term care insurance; discussed this may allow Medicare to cover hospice services.  Reviewed that if it does not Medicare would cover hospice services and facility services may be an out of pocket expense.  Sean Lozano expresses understanding and requests more information -we will have TOC assist.  We also discussed that patient at this time is requiring 24/7 supervision and assistance -Sean Lozano  states he is already been looking into a private duty caregiver resources at night for patient and is in the process of having this set up.  Pending finances and resources the primary goal is to have patient discharged back to [facility] with hospice; if this is financially not feasible the plan would be for discharge back to [facility] with outpatient palliative care.  If [facility] not accept patient back under their care as his needs have increased, he may require placement at another facility.  Sean Lozano denies any DME needs at this time when patient discharges.  All also expresses that he has been in contact with his brother/patient's other son and they are both in agreement on current plan for patient.  Introduced, reviewed, and completed MOST form as outlined under Recommendation section below.  All questions and concerns addressed. Encouraged to call with questions and/or concerns. PMT card provided.  Called and discussed patient and updated goals of care with TOC.  Length of Stay: 1  Current Medications: Scheduled Meds:  . acetaminophen  1,000 mg Oral Q6H  . docusate sodium  100 mg Oral BID  . risperiDONE  0.25 mg Oral Q1400  . sertraline  100 mg Oral Daily  . tamsulosin   0.4 mg Oral QHS    Continuous Infusions: . sodium chloride 100 mL/hr at 08/05/20 0320  . levETIRAcetam 500 mg (08/05/20 0825)    PRN Meds: furosemide, haloperidol lactate, methocarbamol, morphine injection, ondansetron **OR** ondansetron (ZOFRAN) IV, oxyCODONE, polyethylene glycol  Physical Exam Vitals and nursing note reviewed.  Constitutional:      General: He is not in acute distress.    Appearance: He is ill-appearing.  Pulmonary:     Effort: No respiratory distress.  Skin:    General: Skin is warm and dry.     Findings: Laceration present.  Neurological:     Mental Status: He is lethargic.     Motor: Weakness present.             Vital Signs: BP 109/62 (BP Location: Right Arm)   Pulse (!) 50   Temp 97.6 F (36.4 C)   Resp 18   Ht 6' (1.829 m)   Wt 82.6 kg   SpO2 100%   BMI 24.68 kg/m  SpO2: SpO2: 100 % O2 Device: O2 Device: Room Air O2 Flow Rate:    Intake/output summary:   Intake/Output Summary (Last 24 hours) at 08/05/2020 1243 Last data filed at 08/05/2020 0600 Gross per 24 hour  Intake 840 ml  Output 800 ml  Net 40 ml   LBM: Last BM Date:  (PTA) Baseline Weight: Weight: 82.6 kg Most recent weight: Weight: 82.6 kg       Palliative Assessment/Data: PPS 30%.    Flowsheet Rows   Flowsheet Row Most Recent Value  Intake Tab   Referral Department Hospitalist  Unit at Time of Referral ER  Palliative Care Primary Diagnosis Neurology  Date Notified 08/04/20  Palliative Care Type New Palliative care  Reason for referral Clarify Goals of Care  Date of Admission 08/04/20  Date first seen by Palliative Care 08/04/20  # of days Palliative referral response time 0 Day(s)  # of days IP prior to Palliative referral 0  Clinical Assessment   Psychosocial & Spiritual Assessment   Palliative Care Outcomes   Patient/Family meeting held? Yes  Who was at the meeting? son  Palliative Care Outcomes Clarified goals of care, Counseled regarding hospice,  Provided psychosocial or spiritual support, Changed  to focus on comfort, Changed CPR status, Completed durable DNR, Linked to palliative care logitudinal support  Patient/Family wishes: Interventions discontinued/not started  Mechanical Ventilation, Tube feedings/TPN, Hemodialysis, Trach, PEG      Patient Active Problem List   Diagnosis Date Noted  . SDH (subdural hematoma) ([place]) 08/04/2020    Palliative Care Assessment & Plan   Patient Profile: 87 y.o. male  with past medical history of dementia and CAD presented to the [facility] ED on 08/04/2020 from [facility] after a fall on Plavix.  This is the second fall patient has sustained in a 24-hour timeframe.  Patient originally presented to the ED last night after a fall with injury that required 5 staples to the back of his head.  CT at that time was unremarkable and he was discharged back to [facility].  Unfortunately he sustained another fall with laceration to base of skull and was brought back to the ED. Imaging this time revealed a new small right frontal parafalcine subdural hematomaand a new small subdural hematoma layering along the left tentorium.There was no significant mass effectand no midline shift.His CT abdomen/pelvis showed what looks like an L4 compression fracture. Patient was admitted on 08/04/2020 for observation. Neurosurgery recommends holding Palvix for 2 weeks.    Assessment: Frequent falls Advanced dementia Scalp lacerations Lumbar compression fracture Coccyx fracture  Recommendations/Plan: Continue current gentle medical interventions without invasive procedures Continue DNR/DNI as previously documented Family's goal now is for patient to return [facility] with hospice services.  Son is working on private duty caregivers at night as patient now requires 24/7 supervision/assistance.  TOC looking into if [facility] can accept patient back now that his needs have increased.  If they  cannot accept him back he may need placement at another facility Son does not wish for patient to have his life prolonged by heroic measures or artificial means; if patient declines he is open to further [place] discussions for transition to comfort measures MOST form completed as follows: DNR, Comfort Measures (Do not intubate), Determine use or limitation of antibiotics when infection occurs, No IVF, No Feeding tube. Original placed in shadow chart. Copy of HCPOA and MOST form was made and will be scanned into Vynca/ACP tab PMT will continue to follow and support holistically  Goals of Care and Additional Recommendations: Limitations on Scope of Treatment: Avoid Hospitalization, Minimize Medications, Full Scope Treatment, No Artificial Feeding, No Surgical Procedures and No Tracheostomy  Code Status:    Code Status Orders  (From admission, onward)         Start     Ordered   08/04/20 1725  Do not attempt resuscitation (DNR)  Continuous       Question Answer Comment  In the event of cardiac or respiratory ARREST Do not call a "code blue"   In the event of cardiac or respiratory ARREST Do not perform Intubation, CPR, defibrillation or ACLS   In the event of cardiac or respiratory ARREST Use medication by any route, position, wound care, and other measures to relive pain and suffering. May use oxygen, suction and manual treatment of airway obstruction as needed for comfort.      08/04/20 1725        Code Status History    Date Active Date Inactive Code Status Order ID Comments User Context   08/04/2020 1206 08/04/2020 1725 Full Code [id number]  Jesusita Oka, MD ED   Advance Care Planning Activity  Prognosis:  < 6 months  Discharge Planning: [place] with Hospice  Care plan was discussed with primary RN, TOC, patient's son/Sean Lozano, attending  Thank you for allowing the Palliative Medicine Team to assist in the care of this patient.   Total Time  70 minutes  Prolonged Time Billed  yes       Greater than 50%  of this time was spent counseling and coordinating care related to the above assessment and plan.  Lin Landsman, NP  Please contact Palliative Medicine Team phone at [phone] for questions and concerns.   *Portions of this note are a verbal dictation therefore any spelling and/or grammatical errors are due to the "[place] [employer]" system interpretation.

## 2020-08-06 DIAGNOSIS — N400 Enlarged prostate without lower urinary tract symptoms: Secondary | ICD-10-CM | POA: Diagnosis present

## 2020-08-06 DIAGNOSIS — Z66 Do not resuscitate: Secondary | ICD-10-CM | POA: Diagnosis present

## 2020-08-06 DIAGNOSIS — Z885 Allergy status to narcotic agent status: Secondary | ICD-10-CM | POA: Diagnosis not present

## 2020-08-06 DIAGNOSIS — Z888 Allergy status to other drugs, medicaments and biological substances status: Secondary | ICD-10-CM | POA: Diagnosis not present

## 2020-08-06 DIAGNOSIS — R600 Localized edema: Secondary | ICD-10-CM | POA: Diagnosis present

## 2020-08-06 DIAGNOSIS — W19XXXA Unspecified fall, initial encounter: Secondary | ICD-10-CM

## 2020-08-06 DIAGNOSIS — Z20822 Contact with and (suspected) exposure to covid-19: Secondary | ICD-10-CM | POA: Diagnosis present

## 2020-08-06 DIAGNOSIS — Z955 Presence of coronary angioplasty implant and graft: Secondary | ICD-10-CM | POA: Diagnosis not present

## 2020-08-06 DIAGNOSIS — K429 Umbilical hernia without obstruction or gangrene: Secondary | ICD-10-CM | POA: Diagnosis present

## 2020-08-06 DIAGNOSIS — Z7982 Long term (current) use of aspirin: Secondary | ICD-10-CM | POA: Diagnosis not present

## 2020-08-06 DIAGNOSIS — S300XXA Contusion of lower back and pelvis, initial encounter: Secondary | ICD-10-CM | POA: Diagnosis present

## 2020-08-06 DIAGNOSIS — Z79899 Other long term (current) drug therapy: Secondary | ICD-10-CM | POA: Diagnosis not present

## 2020-08-06 DIAGNOSIS — S065X9A Traumatic subdural hemorrhage with loss of consciousness of unspecified duration, initial encounter: Secondary | ICD-10-CM | POA: Diagnosis present

## 2020-08-06 DIAGNOSIS — K219 Gastro-esophageal reflux disease without esophagitis: Secondary | ICD-10-CM | POA: Diagnosis present

## 2020-08-06 DIAGNOSIS — Z7902 Long term (current) use of antithrombotics/antiplatelets: Secondary | ICD-10-CM | POA: Diagnosis not present

## 2020-08-06 DIAGNOSIS — E785 Hyperlipidemia, unspecified: Secondary | ICD-10-CM | POA: Diagnosis present

## 2020-08-06 DIAGNOSIS — S322XXA Fracture of coccyx, initial encounter for closed fracture: Secondary | ICD-10-CM | POA: Diagnosis present

## 2020-08-06 DIAGNOSIS — F0390 Unspecified dementia without behavioral disturbance: Secondary | ICD-10-CM | POA: Diagnosis present

## 2020-08-06 DIAGNOSIS — Z515 Encounter for palliative care: Secondary | ICD-10-CM | POA: Diagnosis not present

## 2020-08-06 DIAGNOSIS — S0101XA Laceration without foreign body of scalp, initial encounter: Secondary | ICD-10-CM | POA: Diagnosis present

## 2020-08-06 DIAGNOSIS — F05 Delirium due to known physiological condition: Secondary | ICD-10-CM | POA: Diagnosis present

## 2020-08-06 DIAGNOSIS — W1830XA Fall on same level, unspecified, initial encounter: Secondary | ICD-10-CM | POA: Diagnosis present

## 2020-08-06 DIAGNOSIS — I10 Essential (primary) hypertension: Secondary | ICD-10-CM | POA: Diagnosis present

## 2020-08-06 DIAGNOSIS — R296 Repeated falls: Secondary | ICD-10-CM | POA: Diagnosis present

## 2020-08-06 DIAGNOSIS — I2510 Atherosclerotic heart disease of native coronary artery without angina pectoris: Secondary | ICD-10-CM | POA: Diagnosis present

## 2020-08-06 LAB — CBC
HCT: 31.3 % — ABNORMAL LOW (ref 39.0–52.0)
Hemoglobin: 10.7 g/dL — ABNORMAL LOW (ref 13.0–17.0)
MCH: 34.3 pg — ABNORMAL HIGH (ref 26.0–34.0)
MCHC: 34.2 g/dL (ref 30.0–36.0)
MCV: 100.3 fL — ABNORMAL HIGH (ref 80.0–100.0)
Platelets: 158 10*3/uL (ref 150–400)
RBC: 3.12 MIL/uL — ABNORMAL LOW (ref 4.22–5.81)
RDW: 13 % (ref 11.5–15.5)
WBC: 9.8 10*3/uL (ref 4.0–10.5)
nRBC: 0 % (ref 0.0–0.2)

## 2020-08-06 MED ORDER — ACETAMINOPHEN 500 MG PO TABS: 1000.0000 mg | ORAL_TABLET | Freq: Four times a day (QID) | ORAL | 0 refills | Status: AC | PRN

## 2020-08-06 MED ORDER — LEVETIRACETAM 500 MG PO TABS: 500.0000 mg | ORAL_TABLET | Freq: Two times a day (BID) | ORAL | 0 refills | Status: AC

## 2020-08-06 MED ORDER — TRAMADOL HCL 50 MG PO TABS: 50.0000 mg | ORAL_TABLET | Freq: Four times a day (QID) | ORAL | 0 refills | Status: AC | PRN

## 2020-08-06 MED ORDER — TRAMADOL HCL 50 MG PO TABS: 50.0000 mg | ORAL_TABLET | Freq: Four times a day (QID) | ORAL | Status: DC | PRN

## 2020-08-06 NOTE — Care Management Obs Status (Signed)
MEDICARE OBSERVATION STATUS NOTIFICATION   Patient Details  Name: Sean Lozano MRN: [id number] Date of Birth: Aug 23, 1933   Medicare Observation Status Notification Given:  Yes    Ella Bodo, RN 08/06/2020, 3:04 PM

## 2020-08-06 NOTE — Progress Notes (Signed)
  Speech Language Pathology Treatment: Dysphagia  Patient Details Name: Sean Lozano MRN: [id number] DOB: Aug 28, 1933 Today's Date: 08/06/2020 Time: 1751-0258 SLP Time Calculation (min) (ACUTE ONLY): 9 min  Assessment / Plan / Recommendation Clinical Impression  Pt seen for ongoing dysphagia management.  Pt was asleep on SLP arrival but was able to rouse.  Pt tolerated thin liquid, puree, soft solid, and regular solid.  There were multiple swallows with liquid by cup and straw. Pt exhibited good oral clearance of solids, but did require cuing to bite regular solid cracker.  Pt took meds crushed in puree without difficulty. Recommend advancing to mechanical soft diet with thin liquids.    HPI HPI: 87 y.o. male  with past medical history of dementia and CAD presented to the [facility] ED on 08/04/2020 from [facility] after a fall on Plavix.  This is the second fall patient has sustained in a 24-hour timeframe.  Patient originally presented to the ED last night after a fall with injury that required 5 staples to the back of his head.  CT at that time was unremarkable and he was discharged back to [facility].  Unfortunately he sustained another fall with laceration to base of skull and was brought back to the ED. Imaging this time revealed a new small right frontal parafalcine subdural hematoma and a new small subdural hematoma layering along the left tentorium. There was no significant mass effect and no midline shift. His CT abdomen/pelvis showed what looks like an L4 compression fracture. Patient was admitted on 08/04/2020 for observation      SLP Plan  Continue with current plan of care       Recommendations  Diet recommendations: Dysphagia 3 (mechanical soft);Thin liquid Liquids provided via: Cup Medication Administration: Crushed with puree Supervision: Trained caregiver to feed patient Compensations: Slow rate;Small sips/bites;Minimize environmental distractions (make sure pt is  awake/alert) Postural Changes and/or Swallow Maneuvers: Seated upright 90 degrees                Oral Care Recommendations: Oral care BID;Staff/trained caregiver to provide oral care Follow up Recommendations: Skilled Nursing facility SLP Visit Diagnosis: Dysphagia, oral phase (R13.11);Dysphagia, unspecified (R13.10) Plan: Continue with current plan of care       [place], [place], [place] Office: [phone] 08/06/2020, 12:03 PM

## 2020-08-06 NOTE — Care Management CC44 (Signed)
Condition Code 44 Documentation Completed  Patient Details  Name: Sean Lozano MRN: [id number] Date of Birth: Jan 20, 1934   Condition Code 44 given:  Yes Patient signature on Condition Code 44 notice:  Yes Documentation of 2 MD's agreement:  Yes Code 44 added to claim:  Yes    Ella Bodo, RN 08/06/2020, 3:04 PM

## 2020-08-06 NOTE — Progress Notes (Signed)
Brief Palliative Medicine Progress Note:  Chart reviewed. Received updates from [place] will accept patient back at their facility with hospice services - patient can discharge today. Goals are clear at this time - PMT will sign off. If there are any imminent needs please call the service directly.  Thank you for allowing PMT to assist in the care of this patient.  Acy Orsak M. [facility] Palliative Medicine Team Team Phone: [phone] NO CHARGE

## 2020-08-06 NOTE — Discharge Summary (Signed)
[facility] Discharge Summary   Patient ID: Sean Lozano MRN: [id number] DOB/AGE: [birth year] 87 y.o.  Admit date: 08/04/2020 Discharge date: 08/06/2020  Discharge Diagnosis Patient Active Problem List   Diagnosis Date Noted  . SDH (subdural hematoma) ([place]) 08/04/2020  recurrent falls   Consultants Neurosurgery Palliative care   Imaging: CT Head W/O 08/04/20 -  IMPRESSION: 1. New small right frontal parafalcine subdural hematoma. New small subdural hematoma layering along the left tentorium. No significant mass effect. No midline shift. 2. Generalized cerebral volume loss and moderate chronic small vessel ischemic changes in the cerebral white matter.  CT ABD/PELVIS 08/04/20  IMPRESSION: 1. Mild L4 superior endplate compression fracture appears acute with no retropulsion or complicating features. 2. Intramuscular hematoma with possible active venous bleeding along the inferior right gluteus, posterior to the right ischium. Nearby fracture of the coccyx, age indeterminate but probably acute. 3. Small area of nonspecific fat/mesenteric stranding at the right pelvic inlet. Difficult to exclude mesenteric contusion here, although the adjacent right ureter and small bowel appear to remain normal. 4. No other acute traumatic injury identified in the abdomen or pelvis. 5. Cholelithiasis. Diverticulosis of the colon. Aortic Atherosclerosis (ICD10-I70.0). Fat containing umbilical hernia.  Procedures None  HPI: 87M s/p ground level fall striking his head, came to ED underwent imaging and had staples placed into a scalp laceration. Went back to memory care facility where he had another ground level fall. Known dementia, h/o frequent falls, uses a walker, most falls are related to not using walker. Cardiac patient s/p 6vCABG and stenting, most recently in 2013/4, last visit 01/2020. Recs to d/c plavix by "geriatric specialist" and plans to d/w cardiologist next week at  scheduled appt. Has been living in the facility since 06-25-20, wife died 2019-12-27.   Hospital Course:  Workup showed above very small SDH along with L4 fracture and neurosurgery was consulted. They recommended admission for observation but did not recommend repeat head imaging unless the patient showed a clinical change.  Neurosurgery felt his L4 vertebra was consistent with degenerative changes rather than acute fracture and recommended a lumbar corset IF the patient had pain for comfort. They recommended holding plavix for at least 2 weeks. Patient was admitted for observation. His dementia medications were resumed.  Palliative care consulted for goals of care. Patient was made DNR/DNI and family chose [facility]. On 08/06/20 the patients vitals were stable and neurologic exam were stable. His narcotics were discontinued, with the exception of PRN Tramadol, due to his somnolence. On 08/06/20 the patient was medically stable for discharge back to the memory care center with hospice services [facility]).   He will need staple removal from his scalp on 08/14/2020. He will need to continue Keppra for seizure prophylaxis to complete a 7 day course.  This patient was not personally evaluated by me on the day of discharge, the above information was obtained entirely from chart review. Allergies as of 08/06/2020      Reactions   Atenolol Other (See Comments)   Listed on MAR   Codeine Other (See Comments)   Listed on MAR   Ondansetron Other (See Comments)   Listed on MAR   Rosuvastatin Other (See Comments)   Listed on MAR   Simvastatin Other (See Comments)   Listed on MAR   Tramadol Other (See Comments)   Listed on MAR   Zetia [ezetimibe] Other (See Comments)   Listed  on [facility]      Medication List    STOP taking these medications   aspirin 81 MG chewable tablet   clopidogrel 75 MG tablet Commonly known as: PLAVIX    risperiDONE 0.25 MG tablet Commonly known as: RISPERDAL   risperiDONE 0.5 MG tablet Commonly known as: RISPERDAL   sertraline 100 MG tablet Commonly known as: ZOLOFT     TAKE these medications   acetaminophen 500 MG tablet Commonly known as: TYLENOL Take 2 tablets (1,000 mg total) by mouth every 6 (six) hours as needed for mild pain or moderate pain. What changed: reasons to take this   Calcium + Vitamin D3 600-400 MG-UNIT Tabs Generic drug: Calcium Carbonate-Vitamin D3 Take 1 tablet by mouth daily.   CertaVite Senior Tabs Take 1 tablet by mouth daily.   feeding supplement Liqd Take 237 mLs by mouth every evening. Chocolate   furosemide 20 MG tablet Commonly known as: LASIX Take 20 mg by mouth daily as needed for fluid or edema.   levETIRAcetam 500 MG tablet Commonly known as: Keppra Take 1 tablet (500 mg total) by mouth 2 (two) times daily for 5 days.   loperamide 2 MG tablet Commonly known as: IMODIUM A-D Take 2-4 mg by mouth See admin instructions. Take 2 tablets by mouth after first loose stool, then 1 tablet by mouth after each subsequently loose stool (MAX 4 tabs in 24/hrs)   nitroGLYCERIN 0.4 MG SL tablet Commonly known as: NITROSTAT Place 0.4 mg under the tongue every 5 (five) minutes as needed for chest pain.   ondansetron 4 MG tablet Commonly known as: ZOFRAN Take 4 mg by mouth every 8 (eight) hours as needed for nausea or vomiting.   polyethylene glycol 17 g packet Commonly known as: MIRALAX / GLYCOLAX Take 17 g by mouth daily as needed for mild constipation.   pravastatin 40 MG tablet Commonly known as: PRAVACHOL Take 40 mg by mouth every evening.   tamsulosin 0.4 MG Caps capsule Commonly known as: FLOMAX Take 0.4 mg by mouth at bedtime.   traMADol 50 MG tablet Commonly known as: ULTRAM Take 1 tablet (50 mg total) by mouth every 6 (six) hours as needed for up to 5 days for moderate pain.         Signed: Obie Dredge, [facility] 08/06/2020, 2:58 PM

## 2020-08-06 NOTE — Progress Notes (Signed)
Pt safely discharged. Son Axle Parfait called and Hospice called.

## 2020-08-06 NOTE — TOC Transition Note (Signed)
Transition of Care [facility]) - CM/SW Discharge Note   Patient Details  Name: Sean Lozano MRN: [id number] Date of Birth: January 13, 1934  [facility] of [facility] CM/SW Contact:  Ella Bodo, RN Phone Number: 08/06/2020, 3:09 PM   Clinical Narrative:  Damaris Schooner with Carloyn Jaeger, director of [place] at [facility]. Per director, patient is able to dc back to facility as prior to admission.  They are recommending Hospice follow up at the facility through [facility].  Spoke with son Al, at bedside; he is agreeable to dc back to facility, and to Hospice f/u with [facility].  Spoke with Trixie Dredge with [facility], and made referral.  She states pt can be discharged back to facility, and Hospice nurse will likely see tomorrow for assessment.  She plans to contact patient's son this afternoon.   Will fax discharge summary to Memory Care director at [phone].  PA has entered dc order and Rx on chart.  Will arrange transport to facility by non-emergent ambulance.  Son asks that he be contacted when patient is picked up.  Updated patient's bedside nurse.         Final next level of care: Memory Care Barriers to Discharge: Barriers Resolved   Patient Goals and CMS Choice   CMS Medicare.gov Compare Post Acute Care list provided to:: Patient Represenative (must comment) (son) Choice offered to / list presented to : Adult Children Soundra Pilon, son)  Discharge Placement  [place]                     Discharge Plan and Services   Discharge Planning Services: CM Consult Post Acute Care Choice: Hospice                    HH Arranged: RN,Social Work,Nurse's Aide [facility] Agency: Hospice and [place] (Now Manufacturing engineer) Date HH Agency Contacted: 08/06/20 Time [place]: [phone] Representative spoke with at [place]: [place] Determinants of Health ([place]) Interventions     Readmission Risk  Interventions No flowsheet data found.  Reinaldo Raddle, RN, BSN  Trauma/Neuro ICU Case Manager [phone]

## 2020-08-06 NOTE — Progress Notes (Signed)
Report called in to [facility] and given to [employer]. All questions and concerns addressed.

## 2020-08-06 NOTE — Progress Notes (Signed)
Manufacturing engineer [facility]) Hospital Liaison: RN note    Notified by Transition of Care Manger Ellan Lambert, RN of patient/family request for [facility] services at [facility] after discharge. Chart and patient information under review by [facility] physician. Hospice eligibility pending currently.    Writer spoke with son, Al,  to initiate education related to hospice philosophy, services and team approach to care. Al verbalized understanding of information given. Per discussion, plan is for discharge to home by PTAR.   Please send signed and completed DNR form home with patient/family. Patient will need prescriptions for discharge comfort medications.     [facility] aware of the above. Please notify [facility] when patient is ready to leave the unit at discharge. (Call [phone] or [phone] after 5pm.)   A Please do not hesitate to call with questions.    Thank you,   Farrel Gordon, RN, [place] (listed on [facility] under [place])    [phone]

## 2020-08-06 NOTE — Progress Notes (Signed)
Subjective: Patient received Haldol overnight as well as morphine and oxy this am.  Unclear that he is having much pain.  He is somnolent for me right now  ROS: unable due to above  Objective: Vital signs in last 24 hours: Temp:  [97.6 F (36.4 C)-99.8 F (37.7 C)] 98.2 F (36.8 C) (04/27 0743) Pulse Rate:  [50-100] 59 (04/27 0745) Resp:  [18-20] 18 (04/27 0743) BP: (93-154)/(59-87) 93/59 (04/27 0745) SpO2:  [96 %-100 %] 97 % (04/27 0743) Last BM Date:  (PTA)  Intake/Output from previous day: 04/26 0701 - 04/27 0700 In: 40 [P.O.:40] Out: 1000 [Urine:1000] Intake/Output this shift: No intake/output data recorded.  PE: Gen: somnolent, laying in bed in NAD HEENT: lacs to back of head are stable with staples Heart: regular Lungs: CTAB Abd: soft, NT, ND, reducible umbilical hernia Psych: unable  Lab Results:  Recent Labs    08/04/20 0800 08/05/20 0339  WBC 10.9* 9.3  HGB 11.5* 10.5*  HCT 36.4* 31.6*  PLT 153 141*   BMET Recent Labs    08/04/20 0800 08/05/20 0339  NA 135 135  K 4.1 3.6  CL 107 105  CO2 22 20*  GLUCOSE 122* 96  BUN 18 13  CREATININE 1.13 0.94  CALCIUM 8.5* 8.2*   PT/INR No results for input(s): LABPROT, INR in the last 72 hours. CMP     Component Value Date/Time   NA 135 08/05/2020 0339   K 3.6 08/05/2020 0339   CL 105 08/05/2020 0339   CO2 20 (L) 08/05/2020 0339   GLUCOSE 96 08/05/2020 0339   BUN 13 08/05/2020 0339   CREATININE 0.94 08/05/2020 0339   CALCIUM 8.2 (L) 08/05/2020 0339   PROT 5.9 (L) 08/04/2020 0800   ALBUMIN 3.2 (L) 08/04/2020 0800   AST 33 08/04/2020 0800   ALT 20 08/04/2020 0800   ALKPHOS 45 08/04/2020 0800   BILITOT 1.2 08/04/2020 0800   GFRNONAA >60 08/05/2020 0339   Lipase  No results found for: LIPASE     Studies/Results: CT Head Wo Contrast  Result Date: 08/04/2020 CLINICAL DATA:  Fall on anticoagulation. EXAM: CT HEAD WITHOUT CONTRAST TECHNIQUE: Contiguous axial images were obtained from  the base of the skull through the vertex without intravenous contrast. COMPARISON:  Head CT from earlier today. FINDINGS: Brain: New small right frontal parafalcine subdural hematoma measuring 3 mm thickness superiorly. New small subdural hematoma layering along the left tentorium. No evidence of parenchymal hemorrhage. No mass lesion, mass effect, or midline shift. No CT evidence of acute infarction. Generalized cerebral volume loss. Nonspecific moderate subcortical and periventricular white matter hypodensity, most in keeping with chronic small vessel ischemic change. Cerebral ventricle sizes are stable and concordant with the degree of cerebral volume loss. Vascular: No acute abnormality. Skull: No evidence of calvarial fracture. Scalp staples noted at the posterior left vertex. Sinuses/Orbits: The visualized paranasal sinuses are essentially clear. Other:  The mastoid air cells are unopacified. IMPRESSION: 1. New small right frontal parafalcine subdural hematoma. New small subdural hematoma layering along the left tentorium. No significant mass effect. No midline shift. 2. Generalized cerebral volume loss and moderate chronic small vessel ischemic changes in the cerebral white matter. Critical Value/emergent results were called by telephone at the time of interpretation on 08/04/2020 at 8:31 am to provider [facility] , who verbally acknowledged these results. Electronically Signed   By: Ilona Sorrel M.D.   On: 08/04/2020 08:33   CT Cervical Spine Wo Contrast  Result Date: 08/04/2020 CLINICAL DATA:  Recurrent fall on anticoagulation. EXAM: CT CERVICAL SPINE WITHOUT CONTRAST TECHNIQUE: Multidetector CT imaging of the cervical spine was performed without intravenous contrast. Multiplanar CT image reconstructions were also generated. COMPARISON:  Cervical spine CT from earlier today. FINDINGS: Alignment: Straightening of the cervical spine. No facet subluxation. Dens is well positioned between the lateral  masses of C1. Skull base and vertebrae: No acute fracture. No primary bone lesion or focal pathologic process. Soft tissues and spinal canal: No prevertebral edema. No visible canal hematoma. Disc levels: Status post ACDF C4-6 with no hardware fracture or loosening. Bony fusion across the C3-4, C4-5 and C5-6 disc spaces. Marked degenerative disc disease throughout the cervical spine. Multilevel moderate effacement of the anterior thecal sac by posterior disc osteophyte complex, most prominent C6-7. Advanced bilateral facet arthropathy. Moderate degenerative foraminal stenosis bilaterally at C3-4 and C4-5, on the left at C5-6 and C6-7 and on the right at C7-T1. Upper chest: No acute abnormality. Other: Visualized mastoid air cells appear clear. No discrete thyroid nodules. No pathologically enlarged cervical nodes. IMPRESSION: 1. No cervical spine fracture or subluxation. 2. Status post ACDF C4-6 with no hardware complication. Bony fusion across the C3-4, C4-5 and C5-6 disc spaces. 3. Marked degenerative changes throughout the cervical spine as detailed. Electronically Signed   By: Ilona Sorrel M.D.   On: 08/04/2020 08:40   CT ABDOMEN PELVIS W CONTRAST  Result Date: 08/04/2020 CLINICAL DATA:  87 year old male status post 2 falls in the past 24 hours. On blood thinners. Dementia. EXAM: CT ABDOMEN AND PELVIS WITH CONTRAST TECHNIQUE: Multidetector CT imaging of the abdomen and pelvis was performed using the standard protocol following bolus administration of intravenous contrast. CONTRAST:  17mL OMNIPAQUE IOHEXOL 300 MG/ML  SOLN COMPARISON:  No prior abdominal or pelvis imaging. FINDINGS: Lower chest: Evidence of prior CABG. No cardiomegaly or pericardial effusion. Patchy bilateral lung base opacity most resembles atelectasis and/or scarring. Hepatobiliary: Cholelithiasis. Otherwise negative gallbladder. Negative liver. Pancreas: Partially atrophied, negative. Spleen: Negative. Adrenals/Urinary Tract: Normal  adrenal glands. Simple fluid density benign appearing left renal upper pole cyst. Symmetric renal enhancement and contrast excretion. No hydroureter. Although there is mild nonspecific inflammatory stranding at the right pelvic inlet along the course of the right ureter seen on series 3, image 65. The bladder dome and decompressed small bowel loops are also nearby. The bladder is moderately distended (280 mL) but otherwise unremarkable. Stomach/Bowel: Moderate retained stool in the rectosigmoid colon. Redundant sigmoid with diverticulosis. Redundant transverse colon. Diverticulosis in the right colon. No large bowel inflammation. Diminutive or absent appendix. Decompressed and negative terminal ileum. No dilated small bowel. Fat containing umbilical hernia. Negative stomach and duodenum. No free air or free fluid. Vascular/Lymphatic: Extensive Aortoiliac calcified atherosclerosis. Major arterial structures remain patent. Patent portal venous system. No lymphadenopathy. Reproductive: Negative. Other: Nonspecific mild presacral stranding.  No pelvic free fluid. Musculoskeletal: Prior sternotomy. Flowing osteophytes in the lower thoracic spine resulting in multilevel ankylosis. Levoconvex lumbar scoliosis. Mild L4 superior endplate compression fracture with acute appearing cortical step-off on coronal image 75. Minimal loss of height. No retropulsion. No complicating features. Other lumbar levels appear intact. Sacrum, SI joints, pelvis and proximal femurs appear intact. Asymmetric left hip joint degeneration. Coccyx is comminuted, although this is age indeterminate. Furthermore there is indistinct soft tissue swelling and stranding along the posteroinferior gluteus muscles, posterior to the right ischium (series 3, image 93). And there is possibly a focus of intramuscular venous hemorrhage on series 3, image 98.  No other superficial soft tissue injury identified. IMPRESSION: 1. Mild L4 superior endplate compression  fracture appears acute with no retropulsion or complicating features. 2. Intramuscular hematoma with possible active venous bleeding along the inferior right gluteus, posterior to the right ischium. Nearby fracture of the coccyx, age indeterminate but probably acute. 3. Small area of nonspecific fat/mesenteric stranding at the right pelvic inlet. Difficult to exclude mesenteric contusion here, although the adjacent right ureter and small bowel appear to remain normal. 4. No other acute traumatic injury identified in the abdomen or pelvis. 5. Cholelithiasis. Diverticulosis of the colon. Aortic Atherosclerosis (ICD10-I70.0). Fat containing umbilical hernia. Electronically Signed   By: Genevie Ann M.D.   On: 08/04/2020 11:16    Anti-infectives: Anti-infectives (From admission, onward)   None       Assessment/Plan GLF R SDH - NSGY c/s, Dr. Annette Stable, keppra x7d for sz ppx, no follow up scan needed unless clinical status changes Scalp lacerations - staples placed by EDP, remove in 10 days L4 endplate fx - NSGY c/s, Dr. Annette Stable, may be more chronic than acute.  Lumbosacral corset prn if he complains of pain. R gluteal hematoma with extrav - hgb 10.5, cbc pending CAD - hold plavix for 2 weeks, but sees cardiologist next week and d/w son about the need to discuss cessation of plavix.   Dementia - resume home meds, sun downs significantly per son. Prn haldol ordered as well.  DC morphine and oxy, tramadol ordered if needed to help with sedation.  DNR/DNI to be DC to facility with hospice involvement. BLE edema - son states it is not related to his heart, resume home lasix dose. FEN - D1, IVFs as he isn't eating much DVT - SCDs, hold chemical ppx  Dispo - awaiting placement decision, medically stable when bed available   LOS: 1 day    Henreitta Cea , [facility] Surgery 08/06/2020, 8:02 AM Please see Amion for pager number during day hours 7:00am-4:30pm or 7:00am -11:30am on weekends

## 2020-08-07 ENCOUNTER — Encounter: Payer: Self-pay | Admitting: Internal Medicine

## 2020-08-11 ENCOUNTER — Ambulatory Visit: Payer: Medicare PPO | Admitting: Neurology

## 2020-08-11 DIAGNOSIS — Z20828 Contact with and (suspected) exposure to other viral communicable diseases: Secondary | ICD-10-CM | POA: Diagnosis not present

## 2020-08-13 ENCOUNTER — Ambulatory Visit: Payer: Medicare PPO | Admitting: Physician Assistant

## 2020-08-18 DIAGNOSIS — Z20828 Contact with and (suspected) exposure to other viral communicable diseases: Secondary | ICD-10-CM | POA: Diagnosis not present

## 2020-08-25 DIAGNOSIS — Z20828 Contact with and (suspected) exposure to other viral communicable diseases: Secondary | ICD-10-CM | POA: Diagnosis not present

## 2020-09-01 DIAGNOSIS — Z20828 Contact with and (suspected) exposure to other viral communicable diseases: Secondary | ICD-10-CM | POA: Diagnosis not present

## 2020-09-08 DIAGNOSIS — Z20828 Contact with and (suspected) exposure to other viral communicable diseases: Secondary | ICD-10-CM | POA: Diagnosis not present

## 2020-09-09 ENCOUNTER — Ambulatory Visit: Payer: Medicare PPO | Admitting: Vascular Surgery

## 2020-09-09 ENCOUNTER — Encounter (HOSPITAL_COMMUNITY): Payer: Medicare PPO

## 2020-09-15 DIAGNOSIS — Z20828 Contact with and (suspected) exposure to other viral communicable diseases: Secondary | ICD-10-CM | POA: Diagnosis not present

## 2020-09-18 DIAGNOSIS — Z20828 Contact with and (suspected) exposure to other viral communicable diseases: Secondary | ICD-10-CM | POA: Diagnosis not present

## 2020-09-22 DIAGNOSIS — Z20828 Contact with and (suspected) exposure to other viral communicable diseases: Secondary | ICD-10-CM | POA: Diagnosis not present

## 2020-09-25 DIAGNOSIS — Z20828 Contact with and (suspected) exposure to other viral communicable diseases: Secondary | ICD-10-CM | POA: Diagnosis not present

## 2020-09-29 DIAGNOSIS — Z20828 Contact with and (suspected) exposure to other viral communicable diseases: Secondary | ICD-10-CM | POA: Diagnosis not present

## 2020-10-02 DIAGNOSIS — Z20828 Contact with and (suspected) exposure to other viral communicable diseases: Secondary | ICD-10-CM | POA: Diagnosis not present

## 2020-10-06 DIAGNOSIS — Z20828 Contact with and (suspected) exposure to other viral communicable diseases: Secondary | ICD-10-CM | POA: Diagnosis not present

## 2020-10-09 DIAGNOSIS — Z20828 Contact with and (suspected) exposure to other viral communicable diseases: Secondary | ICD-10-CM | POA: Diagnosis not present

## 2020-10-14 DIAGNOSIS — Z20828 Contact with and (suspected) exposure to other viral communicable diseases: Secondary | ICD-10-CM | POA: Diagnosis not present

## 2020-10-16 DIAGNOSIS — Z20828 Contact with and (suspected) exposure to other viral communicable diseases: Secondary | ICD-10-CM | POA: Diagnosis not present

## 2020-10-20 DIAGNOSIS — Z20828 Contact with and (suspected) exposure to other viral communicable diseases: Secondary | ICD-10-CM | POA: Diagnosis not present

## 2020-10-25 ENCOUNTER — Emergency Department (HOSPITAL_COMMUNITY)

## 2020-10-25 ENCOUNTER — Emergency Department (HOSPITAL_COMMUNITY)
Admission: EM | Admit: 2020-10-25 | Discharge: 2020-10-25 | Disposition: A | Discharge status: Skilled Nursing Facility | Attending: Emergency Medicine | Admitting: Emergency Medicine

## 2020-10-25 ENCOUNTER — Other Ambulatory Visit: Payer: Self-pay

## 2020-10-25 DIAGNOSIS — Z7902 Long term (current) use of antithrombotics/antiplatelets: Secondary | ICD-10-CM | POA: Diagnosis not present

## 2020-10-25 DIAGNOSIS — W050XXA Fall from non-moving wheelchair, initial encounter: Secondary | ICD-10-CM | POA: Insufficient documentation

## 2020-10-25 DIAGNOSIS — I5032 Chronic diastolic (congestive) heart failure: Secondary | ICD-10-CM | POA: Diagnosis not present

## 2020-10-25 DIAGNOSIS — Y92129 Unspecified place in nursing home as the place of occurrence of the external cause: Secondary | ICD-10-CM | POA: Insufficient documentation

## 2020-10-25 DIAGNOSIS — Z955 Presence of coronary angioplasty implant and graft: Secondary | ICD-10-CM | POA: Insufficient documentation

## 2020-10-25 DIAGNOSIS — S0990XA Unspecified injury of head, initial encounter: Secondary | ICD-10-CM

## 2020-10-25 DIAGNOSIS — Z7982 Long term (current) use of aspirin: Secondary | ICD-10-CM | POA: Diagnosis not present

## 2020-10-25 DIAGNOSIS — Z79899 Other long term (current) drug therapy: Secondary | ICD-10-CM | POA: Insufficient documentation

## 2020-10-25 DIAGNOSIS — S0181XA Laceration without foreign body of other part of head, initial encounter: Secondary | ICD-10-CM | POA: Diagnosis not present

## 2020-10-25 DIAGNOSIS — I10 Essential (primary) hypertension: Secondary | ICD-10-CM | POA: Diagnosis not present

## 2020-10-25 DIAGNOSIS — E785 Hyperlipidemia, unspecified: Secondary | ICD-10-CM | POA: Insufficient documentation

## 2020-10-25 DIAGNOSIS — E1169 Type 2 diabetes mellitus with other specified complication: Secondary | ICD-10-CM | POA: Diagnosis not present

## 2020-10-25 DIAGNOSIS — I2510 Atherosclerotic heart disease of native coronary artery without angina pectoris: Secondary | ICD-10-CM | POA: Diagnosis not present

## 2020-10-25 DIAGNOSIS — R58 Hemorrhage, not elsewhere classified: Secondary | ICD-10-CM | POA: Diagnosis not present

## 2020-10-25 DIAGNOSIS — F0390 Unspecified dementia without behavioral disturbance: Secondary | ICD-10-CM | POA: Diagnosis not present

## 2020-10-25 DIAGNOSIS — W19XXXA Unspecified fall, initial encounter: Secondary | ICD-10-CM | POA: Diagnosis not present

## 2020-10-25 DIAGNOSIS — I110 Hypertensive heart disease with heart failure: Secondary | ICD-10-CM | POA: Diagnosis not present

## 2020-10-25 DIAGNOSIS — J45909 Unspecified asthma, uncomplicated: Secondary | ICD-10-CM | POA: Insufficient documentation

## 2020-10-25 DIAGNOSIS — Z85828 Personal history of other malignant neoplasm of skin: Secondary | ICD-10-CM | POA: Diagnosis not present

## 2020-10-25 DIAGNOSIS — Z951 Presence of aortocoronary bypass graft: Secondary | ICD-10-CM | POA: Insufficient documentation

## 2020-10-25 NOTE — ED Notes (Signed)
Patient transported to CT 

## 2020-10-25 NOTE — Discharge Instructions (Addendum)
As discussed, today's evaluation has been somewhat reassuring.  The head CT and neck CT did not show bleeding, nor broken bones.  Your wound may continue to ooze slowly, but if it has increased amounts of bleeding do not hesitate to return here for additional evaluation.  Otherwise, please keep pressure applied.

## 2020-10-25 NOTE — ED Triage Notes (Signed)
Pt BIB [facility] from [facility] d/t fall from w/c. Sitter at facility reports pt fell straight forward onto face. Arrived to facility in c-collar with a lac to his Lt elbow & face. EMS unaware if he is on thinners d/t papers work inconsistent with showing what he takes. Son arrives shortly after arrival with DNR paperwork, he is hospice pt. baseline is confusion, "stable" (per EMS). 158/86, 64 bpm, 124 CBG, 97% RA.

## 2020-10-25 NOTE — ED Notes (Signed)
Pt had poor weight bearing ability. This RN, plus tech & his son assisted pt into son's car. He did very minimal on his own.

## 2020-10-25 NOTE — ED Notes (Signed)
Wound care was performed, EDP Vanita Panda informed.

## 2020-10-25 NOTE — Progress Notes (Signed)
Manufacturing engineer [facility]) [facility] Liaison Note  [name] is a current [facility] patient with a terminal diagnosis of Alzheimer's dementia with behavioral disturbance. He is a resident of [place]. [facility] will follow along for disposition.   Please don't hesitate to call with any hospice related questions or concerns.   Sean Garner, RN, BSN, [facility] [phone]

## 2020-10-25 NOTE — ED Notes (Signed)
Pt's lower left leg wound, left elbow abrasion & forehead wound had ABT ointment applied & were all covered with nonstick pad & gauze wrapped, per EDP order.

## 2020-10-25 NOTE — ED Provider Notes (Signed)
[facility] EMERGENCY DEPARTMENT Provider Note   CSN: [id number] Arrival date & time: 10/25/20  4650     History Chief Complaint  Patient presents with   Sean Lozano is a 87 y.o. male.  HPI Patient presents from nursing facility after a fall.  He is initially alone, but his son joins him soon after the initial exam.  Seemingly the patient was accompanied by an aide, and when the aide had a distracting event the patient had a fall from his wheelchair.  He is fell striking his head.  No reported change in interactivity.  Patient has advanced dementia, is enrolled in hospice care, level 5 caveat.  According the patient's son he is interacting in a typical manner.    Past Medical History:  Diagnosis Date   1st degree AV block    Arthritis    Asthma    BARRETTS ESOPHAGUS 06/02/2007   Benign prostatic hypertrophy    Bladder neck obstruction 09/29/2010   BPH (benign prostatic hyperplasia)    CAD (coronary artery disease)    a. CABG x 6 in 1992. b. s/p DES x 2 to SVG -> OM1/OM2 in 09/2010. c. s/p DES to mLAD 10/2010. d. 12/2011 NSTEMI (off DAPT) s/p PTCA of ISR of DES in SVG-OM, Recs for lifelong DAPT    CAD (coronary artery disease)    Carotid artery disease ([place])    Doppler, May, 2014, 3-54% R. ICA, 65-68% LICA, with recent syncope patient sent for consult with vascular surgery   Carotid bruit    April, 2014   Cervical disc disease    Concussion July 09, 2012   Scalp   Dementia ([place])    Depression    Diabetes mellitus    type II-diet   Diverticulosis    Echocardiogram    Echo 09/2018: EF 60-65, Gr 1 DD, mild AoV sclerosis, trivial TR   Ejection fraction    EF 60%, echo, June, 2012   Fall at home July 09, 2012   Frequent falls    GERD (gastroesophageal reflux disease)    Hearing loss 2014   Hx of CABG    1992   Hyperlipemia    Hyperlipidemia    intolerance to Crestor Zocor Vytorin. He tolerates Pravachol...low HDL   Hypertension     Osteoarthritis    Paresthesias    successful surgery by Dr. Earnie Larsson   Prostatitis    Rosacea    Sinus bradycardia    asymptomatic    SKIN LESION 06/02/2007   Squamous cell carcinoma of skin 08/22/2019   moderately differentiated on right buccal cheek(excision) margins free   Subdural hematoma (HCC)    Subdural hematoma secondary to falling off a ladder  April, 2014,  medical therapy,  subdural did not expand while blood levels of ddual antiplatelet therapy decreased over one week.  Episode of chest pain in the hospital,, dual antiplatelet therapy restarted approximately one week after it was held   Syncope    The patient lost consciousness and hit his head July 09 2012, not clear yet if he had true syncope    Patient Active Problem List   Diagnosis Date Noted   SDH (subdural hematoma) ([place]) 08/04/2020   Oth nondisp fx of seventh cervical vertebra, init ([place]) 01/01/2020   Bradycardia 06/14/2018   Fatigue 06/14/2018   Osteoarthritis of left knee 01/28/2018   Chest pain 12/02/2017   Atypical chest pain 12/02/2017   Epigastric pain  Essential hypertension    Fall    Weakness due to acute stroke ([place]) 10/20/2017   Stroke ([place]) 10/20/2017   Elevated MCV 06/08/2017   Back pain 06/08/2017   Bilateral leg pain 12/01/2016   Knee pain 09/19/2013   Occlusion and stenosis of carotid artery without mention of cerebral infarction 08/22/2012   Carotid artery disease ([place])    Syncope    Chronic diastolic congestive heart failure, NYHA class 1/ECHO 2010 07/10/2012   SDH (subdural hematoma) ([place]) 07/09/2012   Erectile dysfunction 04/01/2011   Ejection fraction    CAD (coronary artery disease)    Hyperlipidemia    Diabetes ([place])    Hx of CABG    Preventative health care 09/29/2010   Dyspnea 09/29/2010   Bladder neck obstruction 09/29/2010   Depression 06/04/2007   Asthma 06/04/2007   GERD 06/04/2007   BENIGN PROSTATIC HYPERTROPHY 06/04/2007   BARRETTS ESOPHAGUS 06/02/2007    Rosacea 06/02/2007   Skin lesion 06/02/2007   FATIGUE 06/02/2007    Past Surgical History:  Procedure Laterality Date   APPENDECTOMY     Barretts Esophagus   20/20/09   Bladder neck obstruction  09/29/10   C-spine disc surgery  03/2006   C4-5 surgery  01/2009   CARDIAC CATHETERIZATION  July 2012   stent placed  X's  4   COCCYX [place]   ENDARTERECTOMY Left 09/07/2012   Procedure: ENDARTERECTOMY CAROTID with patch angioplasty;  Surgeon: Mal Misty, MD;  Location: [facility] OR;  Service: Vascular;  Laterality: Left;   LEFT HEART CATHETERIZATION WITH CORONARY/GRAFT ANGIOGRAM N/A 12/21/2011   Procedure: LEFT HEART CATHETERIZATION WITH Beatrix Fetters;  Surgeon: Hillary Bow, MD;  Location: [facility] CATH LAB;  Service: Cardiovascular;  Laterality: N/A;   LEG SURGERY     for a fracture   TONSILLECTOMY         Family History  Problem Relation Age of Onset   Heart disease Father        mother   Heart attack Father    Diabetes Sister        mother   Kidney disease Sister    Hypertension Son    Hyperlipidemia Son    Hypertension Son    Diabetes Mother    Heart disease Mother    Diabetes Son    Aneurysm Other    Hyperlipidemia Other    Liver cancer Other        grandfather    Social History   Tobacco Use   Smoking status: Never   Smokeless tobacco: Never  Vaping Use   Vaping Use: Never used  Substance Use Topics   Alcohol use: Not Currently    Comment: occ   Drug use: Never    Home Medications Prior to Admission medications   Medication Sig Start Date End Date Taking? Authorizing Provider  acetaminophen (TYLENOL) 500 MG tablet Take 500 mg by mouth every 6 (six) hours as needed (pain or temp greater than 100.5). Patient not taking: Reported on 07/30/2020    [provider]  acetaminophen (TYLENOL) 500 MG tablet Take 2 tablets (1,000 mg total) by mouth every  6 (six) hours as needed for mild pain or moderate pain. 08/06/20   Jill Alexanders, PA-C  aspirin 81 MG chewable tablet Chew 81 mg by mouth at bedtime.    [provider]  Calcium Carb-Cholecalciferol (CALCIUM CARBONATE-VITAMIN D3)  600-400 MG-UNIT TABS Take 1 tablet by mouth daily.    [provider]  Calcium Carbonate-Vitamin D3 (CALCIUM + VITAMIN D3) 600-400 MG-UNIT TABS Take 1 tablet by mouth daily.    [provider]  clopidogrel (PLAVIX) 75 MG tablet TAKE 1 TABLET BY MOUTH EVERY DAY 05/28/19   Sherren Mocha, MD  Ensure Plus (ENSURE PLUS) LIQD Take 1 Can by mouth 2 (two) times daily between meals.    [provider]  feeding supplement (ENSURE ENLIVE / ENSURE PLUS) LIQD Take 237 mLs by mouth every evening. Chocolate    [provider]  furosemide (LASIX) 20 MG tablet Take 1 tablet (20 mg total) by mouth as needed for fluid or edema (significant edema). Patient not taking: Reported on 07/30/2020 03/31/20   Richardson Dopp T, PA-C  furosemide (LASIX) 20 MG tablet Take 20 mg by mouth daily as needed for fluid or edema.    [provider]  levETIRAcetam (KEPPRA) 500 MG tablet Take 1 tablet (500 mg total) by mouth 2 (two) times daily for 5 days. 08/06/20 08/11/20  Jill Alexanders, PA-C  loperamide (IMODIUM A-D) 2 MG tablet Take 2-4 mg by mouth See admin instructions. Take 2 tablets by mouth after first loose stool, then 1 tablet by mouth after each subsequently loose stool (MAX 4 tabs in 24/hrs)    [provider]  Menthol, Topical Analgesic, (BIOFREEZE) 4 % GEL Apply 1 application topically 4 (four) times daily as needed (knee pain). Patient not taking: Reported on 07/30/2020    [provider]  Multiple Vitamins-Minerals (CERTAVITE SENIOR) TABS Take 1 tablet by mouth daily.    [provider]  Multiple Vitamins-Minerals (CERTAVITE SENIOR) TABS Take 1 tablet by mouth daily.    [provider]  nitroGLYCERIN  (NITROSTAT) 0.4 MG SL tablet Place 1 tablet (0.4 mg total) under the tongue every 5 (five) minutes as needed for chest pain. 05/03/18   Sherren Mocha, MD  nitroGLYCERIN (NITROSTAT) 0.4 MG SL tablet Place 0.4 mg under the tongue every 5 (five) minutes as needed for chest pain.    [provider]  ondansetron (ZOFRAN) 4 MG tablet Take 4 mg by mouth every 8 (eight) hours as needed for nausea or vomiting.    [provider]  polyethylene glycol (MIRALAX / GLYCOLAX) 17 g packet Take 17 g by mouth daily as needed (constipation).    [provider]  polyethylene glycol (MIRALAX / GLYCOLAX) 17 g packet Take 17 g by mouth daily as needed for mild constipation.    [provider]  pravastatin (PRAVACHOL) 40 MG tablet TAKE 1 TABLET BY MOUTH EVERY DAY IN THE EVENING 07/02/19   Sherren Mocha, MD  pravastatin (PRAVACHOL) 40 MG tablet Take 40 mg by mouth every evening.    [provider]  sertraline (ZOLOFT) 50 MG tablet 50 mg 2 (two) times daily. Take 1 1/2 tablet by mouth daily (75 mg total)    [provider]  tamsulosin (FLOMAX) 0.4 MG CAPS capsule TAKE 1 CAPSULE (0.4 MG TOTAL) BY MOUTH DAILY AFTER SUPPER. 06/25/19   Biagio Borg, MD  tamsulosin (FLOMAX) 0.4 MG CAPS capsule Take 0.4 mg by mouth at bedtime.    [provider]    Allergies    Atenolol, Rosuvastatin, Simvastatin, Ultram [tramadol], Zofran [ondansetron hcl], Atenolol, Codeine, Ondansetron, Rosuvastatin, Simvastatin, Tramadol, Zetia [ezetimibe], Adhesive [tape], Codeine, and Ezetimibe-simvastatin  Review of Systems   Review of Systems  Unable to perform ROS: Dementia   Physical  Exam Updated Vital Signs BP 124/71   Pulse 78   Temp 97.8 F (36.6 C) (Oral)   Resp 20   SpO2 96%   Physical Exam Vitals and nursing note reviewed.  Constitutional:      General: He is not in acute distress.    Appearance: He is well-developed.  HENT:     Head: Normocephalic.   Eyes:      Conjunctiva/sclera: Conjunctivae normal.  Neck:   Cardiovascular:     Rate and Rhythm: Normal rate and regular rhythm.  Pulmonary:     Effort: Pulmonary effort is normal. No respiratory distress.     Breath sounds: No stridor.  Abdominal:     General: There is no distension.  Musculoskeletal:     Comments: No gross deformities.  Open wound to the left lateral calf.  Chronic according to his son.  Skin:    General: Skin is warm and dry.  Neurological:     Mental Status: He is alert.     Motor: Atrophy present.     Comments: Patient responds to stimuli, but is essentially withdrawn, noninteractive.  He does move all extremities spontaneously as well.  Face is symmetric, speech is very quiet, but clear.  Psychiatric:        Cognition and Memory: Cognition is impaired. Memory is impaired.    ED Results / Procedures / Treatments   Labs (all labs ordered are listed, but only abnormal results are displayed) Labs Reviewed - No data to display  EKG EKG Interpretation  Date/Time:  Saturday October 25 2020 07:50:50 EDT Ventricular Rate:  78 PR Interval:  276 QRS Duration: 111 QT Interval:  409 QTC Calculation: 466 R Axis:   243 Text Interpretation: Sinus rhythm Prolonged PR interval LAD, consider left anterior fascicular block Abnormal R-wave progression, early transition Artifact Abnormal ECG Confirmed by Carmin Muskrat [phone]) on 10/25/2020 7:53:23 AM  Radiology CT Head Wo Contrast  Result Date: 10/25/2020 CLINICAL DATA:  Poly trauma. Suspect head/C-spine injury. Fell from wheelchair. Fell onto face. EXAM: CT HEAD WITHOUT CONTRAST CT CERVICAL SPINE WITHOUT CONTRAST TECHNIQUE: Multidetector CT imaging of the head and cervical spine was performed following the standard protocol without intravenous contrast. Multiplanar CT image reconstructions of the cervical spine were also generated. COMPARISON:  11/17/2019 FINDINGS: CT HEAD FINDINGS Brain: There is moderate central and cortical  atrophy. Periventricular white matter changes are consistent with small vessel disease. There is no intra or extra-axial fluid collection or mass lesion. The basilar cisterns and ventricles have a normal appearance. There is no CT evidence for acute infarction or hemorrhage. Vascular: Extensive atherosclerotic calcification of the internal carotid arteries. No hyperdense vessels. Skull: Normal. Negative for fracture or focal lesion. Sinuses/Orbits: No acute finding. Other: Large frontal scalp hematoma not associated with underlying fracture. CT CERVICAL SPINE FINDINGS Alignment: Status post anterior fusion from C3 through C6. Loss of lordosis. Skull base and vertebrae: No acute fracture. Soft tissues and spinal canal: Atherosclerosis of the vertebral arteries. There is moderate stenosis secondary to large posterior osteophyte at C2-3, C3-4, and C6-7. Disc levels: Bilateral foraminal narrowing throughout the mid cervical spine secondary to large osteophytes. Upper chest: Negative. Other: None IMPRESSION: 1.  No evidence for acute intracranial abnormality. 2. Atrophy and small vessel disease. 3. Large and frontal scalp hematoma. 4. Postoperative changes and significant cervical stenosis secondary to degenerative changes. 5.  No evidence for acute cervical spine abnormality. Electronically Signed   By: Nolon Nations M.D.   On: 10/25/2020 09:49  CT Cervical Spine Wo Contrast  Result Date: 10/25/2020 CLINICAL DATA:  Poly trauma. Suspect head/C-spine injury. Fell from wheelchair. Fell onto face. EXAM: CT HEAD WITHOUT CONTRAST CT CERVICAL SPINE WITHOUT CONTRAST TECHNIQUE: Multidetector CT imaging of the head and cervical spine was performed following the standard protocol without intravenous contrast. Multiplanar CT image reconstructions of the cervical spine were also generated. COMPARISON:  11/17/2019 FINDINGS: CT HEAD FINDINGS Brain: There is moderate central and cortical atrophy. Periventricular white matter  changes are consistent with small vessel disease. There is no intra or extra-axial fluid collection or mass lesion. The basilar cisterns and ventricles have a normal appearance. There is no CT evidence for acute infarction or hemorrhage. Vascular: Extensive atherosclerotic calcification of the internal carotid arteries. No hyperdense vessels. Skull: Normal. Negative for fracture or focal lesion. Sinuses/Orbits: No acute finding. Other: Large frontal scalp hematoma not associated with underlying fracture. CT CERVICAL SPINE FINDINGS Alignment: Status post anterior fusion from C3 through C6. Loss of lordosis. Skull base and vertebrae: No acute fracture. Soft tissues and spinal canal: Atherosclerosis of the vertebral arteries. There is moderate stenosis secondary to large posterior osteophyte at C2-3, C3-4, and C6-7. Disc levels: Bilateral foraminal narrowing throughout the mid cervical spine secondary to large osteophytes. Upper chest: Negative. Other: None IMPRESSION: 1.  No evidence for acute intracranial abnormality. 2. Atrophy and small vessel disease. 3. Large and frontal scalp hematoma. 4. Postoperative changes and significant cervical stenosis secondary to degenerative changes. 5.  No evidence for acute cervical spine abnormality. Electronically Signed   By: Nolon Nations M.D.   On: 10/25/2020 09:49    Procedures Procedures   Medications Ordered in ED Medications - No data to display  ED Course  I have reviewed the triage vital signs and the nursing notes.  Pertinent labs & imaging results that were available during my care of the patient were reviewed by me and considered in my medical decision making (see chart for details).   Patient in no distress, no accompanied by his son.   11:14 AM After wounds have been cleaned, the forehead wound is more consistent with contusion with oozing from multiple small areas, no wound amenable to suture or Dermabond repair.  Pressure application does result  in improved hemostasis.  I reviewed the CT scan, discussed them with the patient's son.  No evidence for intracranial hemorrhage, no evidence for fracture of the skull, no neck. Patient is interacting in a typical manner, has vital signs that are unremarkable, and with head CTs without acute new findings, is appropriate to return to his facility, under continued hospice care. MDM Rules/Calculators/A&P MDM Number of Diagnoses or Management Options Fall, initial encounter: new, needed workup Injury of head, initial encounter: new, needed workup   Amount and/or Complexity of Data Reviewed Tests in the radiology section of CPT: ordered and reviewed Decide to obtain previous medical records or to obtain history from someone other than the patient: yes Obtain history from someone other than the patient: yes Review and summarize past medical records: yes Independent visualization of images, tracings, or specimens: yes  Risk of Complications, Morbidity, and/or Mortality Presenting problems: high Diagnostic procedures: high Management options: high  Critical Care Total time providing critical care: < 30 minutes  Patient Progress Patient progress: stable   Final Clinical Impression(s) / ED Diagnoses Final diagnoses:  Fall, initial encounter  Injury of head, initial encounter     Carmin Muskrat, MD 10/25/20 1116

## 2020-11-24 DIAGNOSIS — Z20828 Contact with and (suspected) exposure to other viral communicable diseases: Secondary | ICD-10-CM | POA: Diagnosis not present

## 2020-11-27 DIAGNOSIS — Z20828 Contact with and (suspected) exposure to other viral communicable diseases: Secondary | ICD-10-CM | POA: Diagnosis not present

## 2020-12-01 DIAGNOSIS — Z20828 Contact with and (suspected) exposure to other viral communicable diseases: Secondary | ICD-10-CM | POA: Diagnosis not present

## 2020-12-08 DIAGNOSIS — Z20828 Contact with and (suspected) exposure to other viral communicable diseases: Secondary | ICD-10-CM | POA: Diagnosis not present

## 2020-12-11 DIAGNOSIS — Z20828 Contact with and (suspected) exposure to other viral communicable diseases: Secondary | ICD-10-CM | POA: Diagnosis not present

## 2021-01-29 ENCOUNTER — Ambulatory Visit: Payer: Medicare PPO | Admitting: Internal Medicine

## 2021-02-10 DEATH — deceased

## 2021-12-14 IMAGING — CT CT CERVICAL SPINE W/O CM
3 of 4 series · 13 of 33 positions shown, 16 images · non-contrast
Comparison: Head CT 06/14/2018, CT of the cervical spine
07/09/2012.

CLINICAL DATA: Head trauma, minor. Additional provided: Mechanical
trip and fall today, abrasion to front of head, on blood thinners

EXAM:
CT HEAD WITHOUT CONTRAST
CT CERVICAL SPINE WITHOUT CONTRAST
TECHNIQUE: Multidetector CT imaging of the head and cervical spine was
performed following the standard protocol without intravenous
contrast. Multiplanar CT image reconstructions of the cervical spine
were also generated.

[Series 4: c_spine 2.0 st · axial · 0.38mm/px · z∈[-292,-144]mm · 5 of 112 slices shown, 7 images]
[im 19/112  soft-tissue]
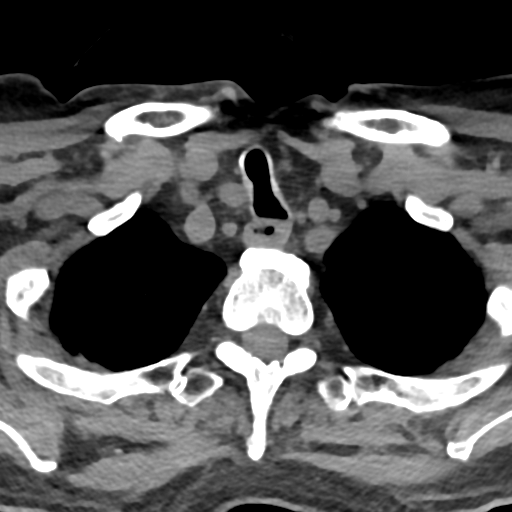
[im 19/112  bone]
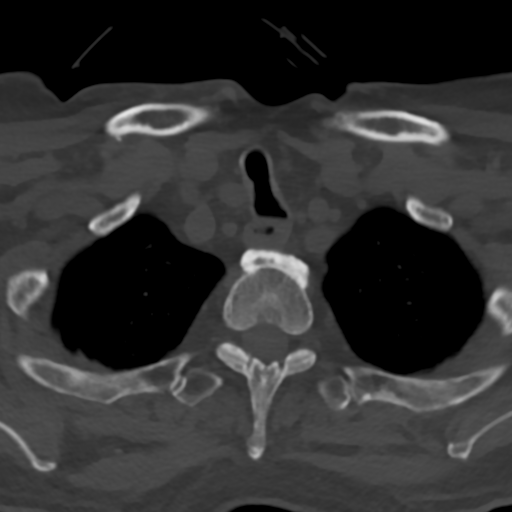
[im 38/112  bone]
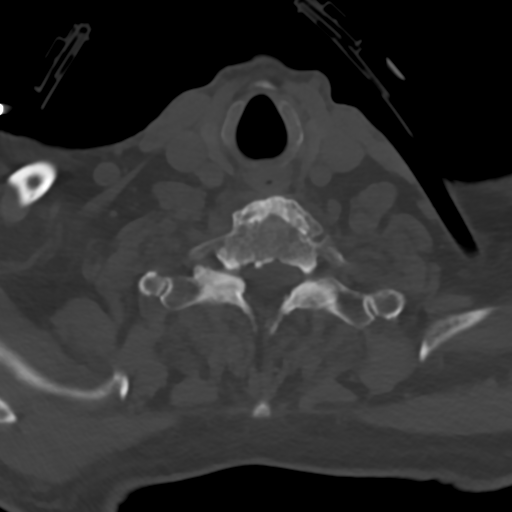
[im 56/112  bone]
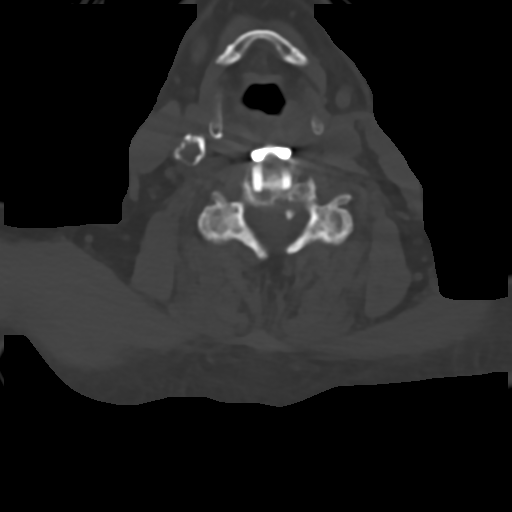
[im 75/112  bone]
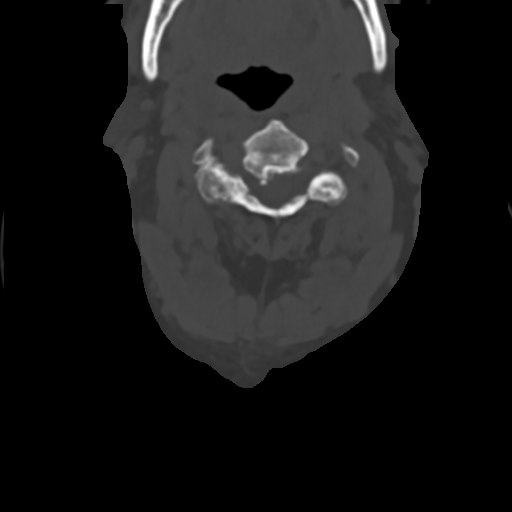
[im 93/112  soft-tissue]
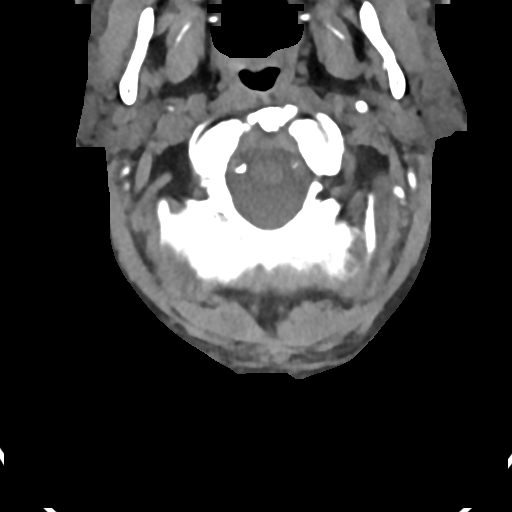
[im 93/112  bone]
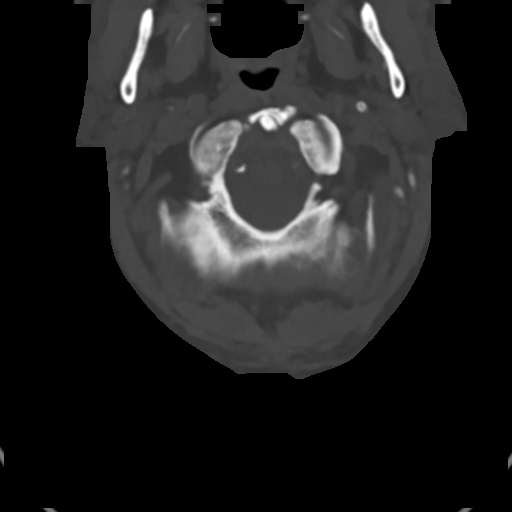

[Series 9: c_spine 2.0 sag bone · sagittal · 0.43mm/px · 5 of 77 slices shown, 6 images]
[im 26/77  bone]
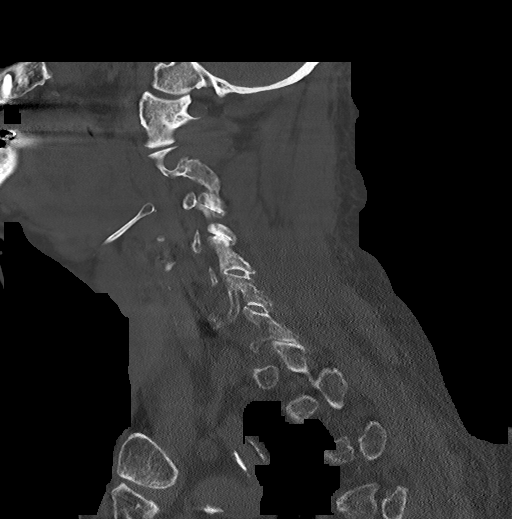
[im 32/77  bone]
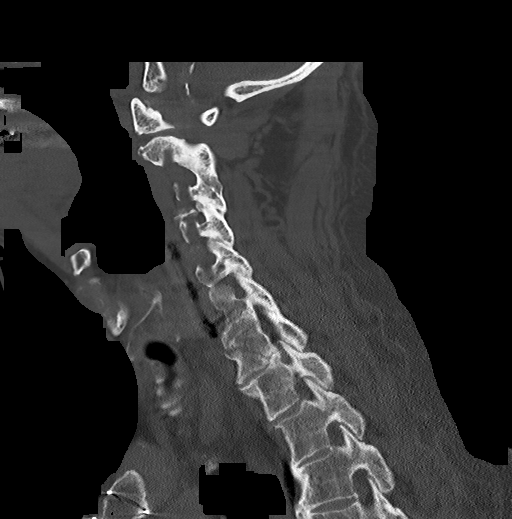
[im 39/77  soft-tissue]
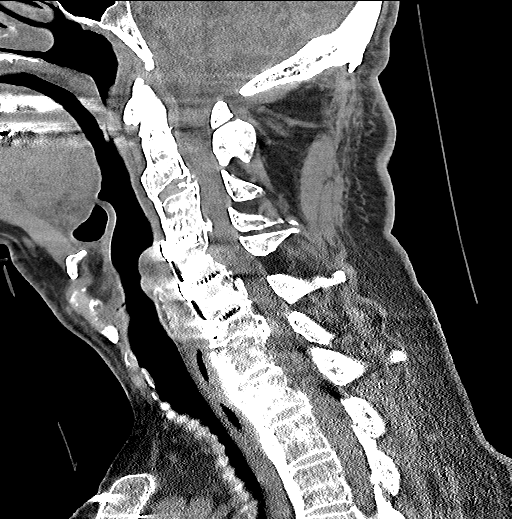
[im 39/77  bone]
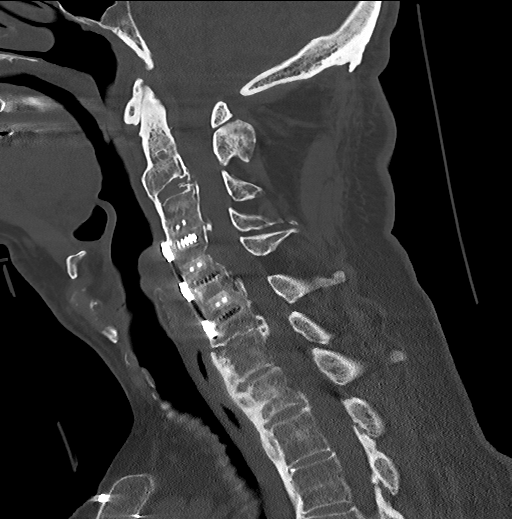
[im 45/77  bone]
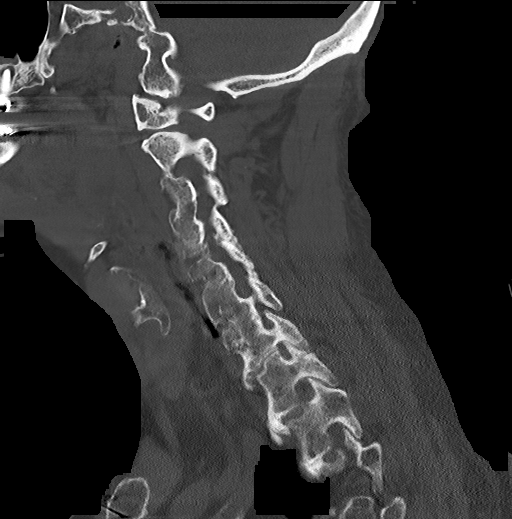
[im 51/77  bone]
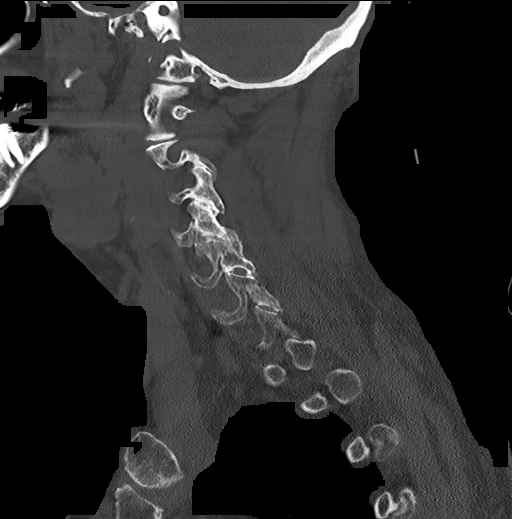

[Series 10: c_spine 2.0 cor bone · coronal · 0.36mm/px · 3 of 96 slices shown]
[im 20/96  bone]
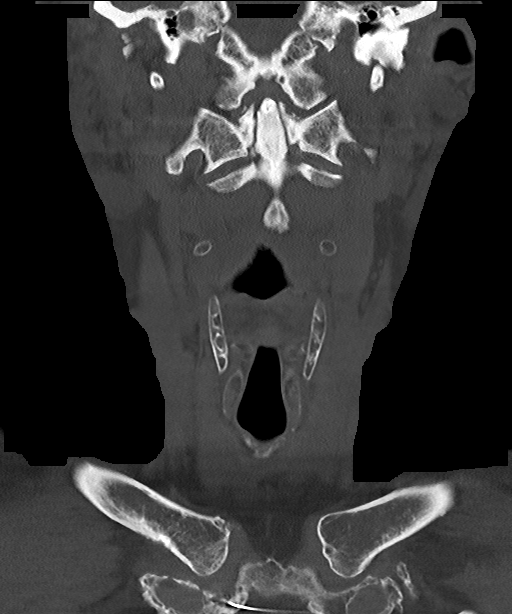
[im 39/96  bone]
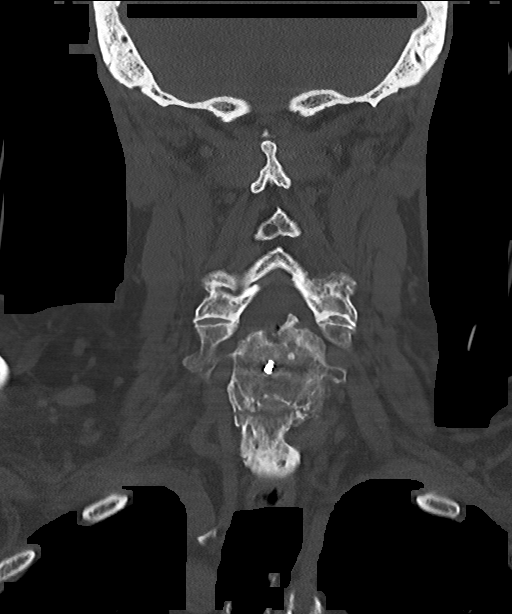
[im 58/96  bone]
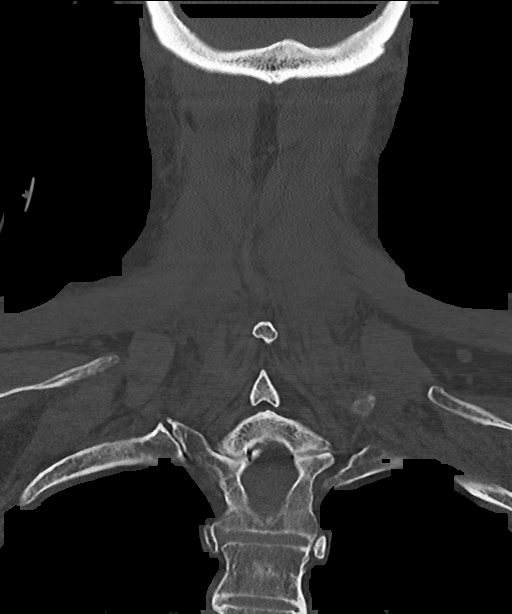

[13 of 33 positions shown; findings below may reference images not displayed]

FINDINGS: CT HEAD FINDINGS

Brain:

Stable, moderate generalized parenchymal atrophy.

Stable, moderate patchy hypoattenuation within the cerebral white
matter which is nonspecific, but consistent with chronic small
vessel ischemic disease.

There is no acute intracranial hemorrhage.

No demarcated cortical infarct.

No extra-axial fluid collection.

No evidence of intracranial mass.

No midline shift.

Vascular: No hyperdense vessel.  Atherosclerotic calcifications.

Skull: Normal. Negative for fracture or focal lesion.

Sinuses/Orbits: Visualized orbits show no acute finding. No
significant paranasal sinus disease or mastoid effusion at the
imaged levels.

Other: Sizable midline frontal scalp/forehead hematoma.

CT CERVICAL SPINE FINDINGS

Alignment: Straightening of the expected cervical lordosis. No
significant spondylolisthesis.

Skull base and vertebrae: The basion-dental and atlanto-dental
intervals are maintained. Sequela of prior C3-C6 ACDF. ACDF hardware
remains at the C4-C6 levels. No evidence of hardware compromise.
There is an acute fracture which extends through a C6-C7 ventral
enthesophyte and the anterior aspect of the C6-C7 disc space.
Nondisplaced fracture components also extend horizontally and
obliquely through the C7 vertebra (for instance as seen on series 9,
image 34) (series 10, images 30 8-41).

Soft tissues and spinal canal: No prevertebral fluid or swelling. No
visible canal hematoma.

Disc levels:

Sequela of prior C3-C6 ACDF. Additionally, there are fusing bridging
osteophytes/enthesophytes anteriorly and posteriorly at C2-C3.
Multilevel fusing ventral osteophytes at the C7 and visualized more
caudal levels. Additionally, there is multilevel fusion of the
posterior elements.

Multilevel vertebral osteophytosis/ossification of the posterior
longitudinal ligament. There is at least moderate bony spinal canal
stenosis at C3-C4 and C6-C7. Multilevel bony neural foraminal
narrowing.

Upper chest: No consolidation within the imaged lung apices. No
visible pneumothorax.

These results were called by telephone at the time of interpretation
on 11/15/2019 at [DATE] to provider RYAN KENNETH RENTERIA , who verbally
acknowledged these results.
IMPRESSION: CT head:

1. No evidence of acute intracranial abnormality.
2. Sizable midline frontal scalp/forehead hematoma.
3. Stable moderate generalized parenchymal atrophy and chronic small
vessel ischemic disease.

CT cervical spine:

1. Acute fracture traversing a ventral C6-C7 enthesophyte and at
least portions of the C6-C7 disc space. Nondisplaced acute fractures
also extend horizontally and obliquely through the C7 vertebra
toward the inferior endplate. Given the appearance of these
fractures, there is high suspicion for ligamentous injury and MRI of
the cervical spine is recommended for further evaluation.
2. Sequela of prior ACDF at the C3-C6 levels. ACDF hardware remains
at the C4-C6 levels. No evidence of acute hardware compromise.
3. Superimposed cervical spondylosis and ossification of the
posterior longitudinal ligament. There is at least moderate bony
spinal canal stenosis at C3-C4 and C6-C7.

## 2021-12-14 IMAGING — CT CT HEAD W/O CM
3 of 4 series · 13 of 47 positions shown, 15 images · non-contrast
Comparison: Head CT 06/14/2018, CT of the cervical spine
07/09/2012.

CLINICAL DATA: Head trauma, minor. Additional provided: Mechanical
trip and fall today, abrasion to front of head, on blood thinners

EXAM:
CT HEAD WITHOUT CONTRAST
CT CERVICAL SPINE WITHOUT CONTRAST
TECHNIQUE: Multidetector CT imaging of the head and cervical spine was
performed following the standard protocol without intravenous
contrast. Multiplanar CT image reconstructions of the cervical spine
were also generated.

[Series 3: head without · axial · non-contrast · 0.47mm/px · z∈[-103,+17]mm · 7 of 34 slices shown, 9 images]
[im 5/34  brain]
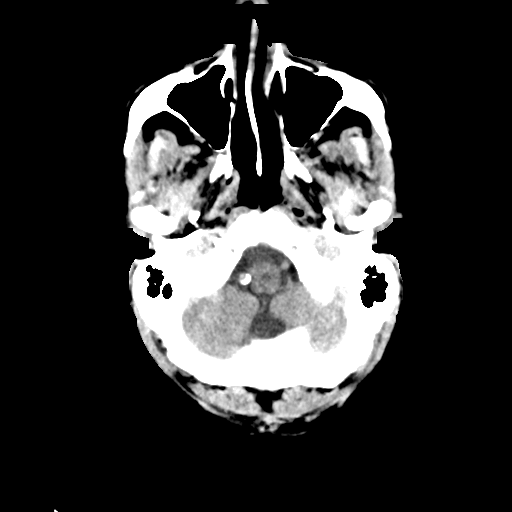
[im 5/34  bone]
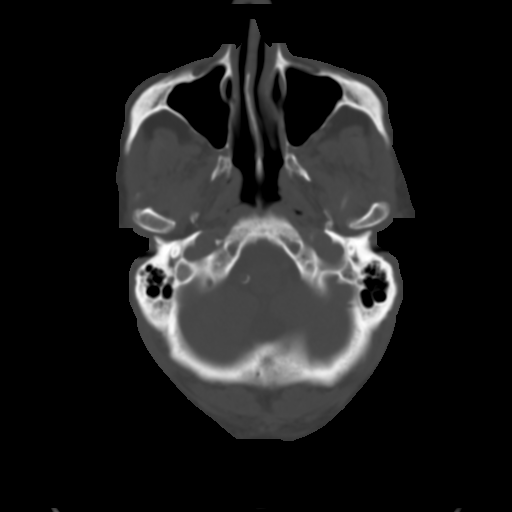
[im 9/34  brain]
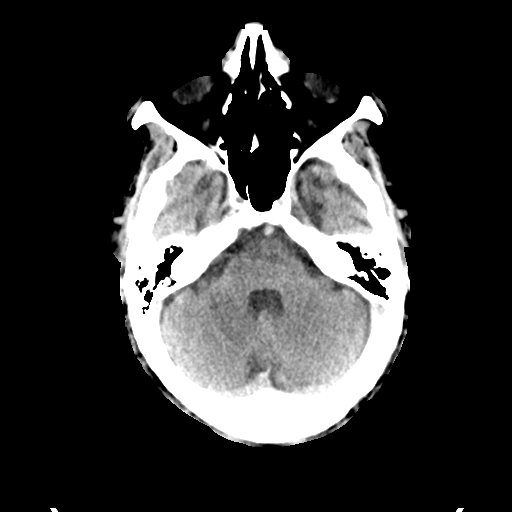
[im 13/34  brain]
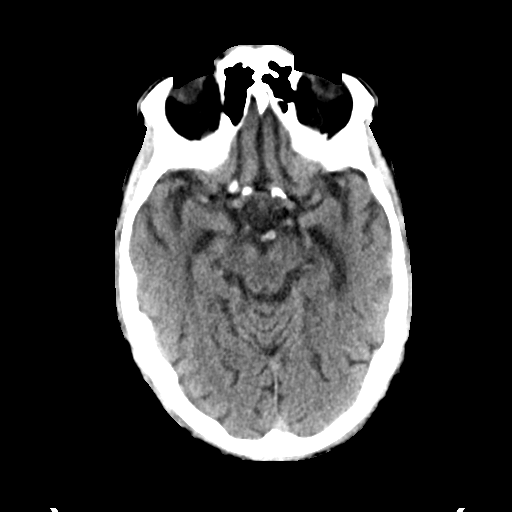
[im 17/34  brain]
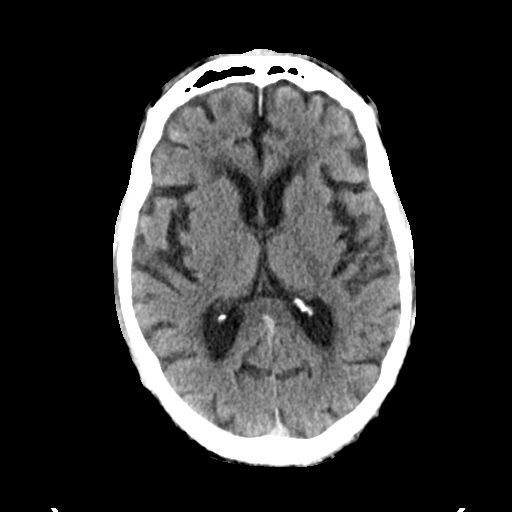
[im 21/34  brain]
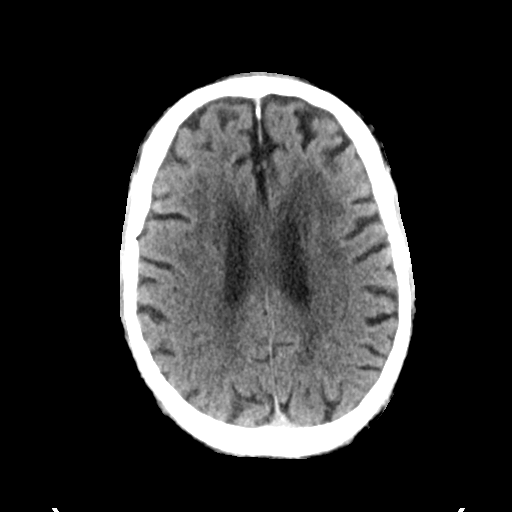
[im 21/34  bone]
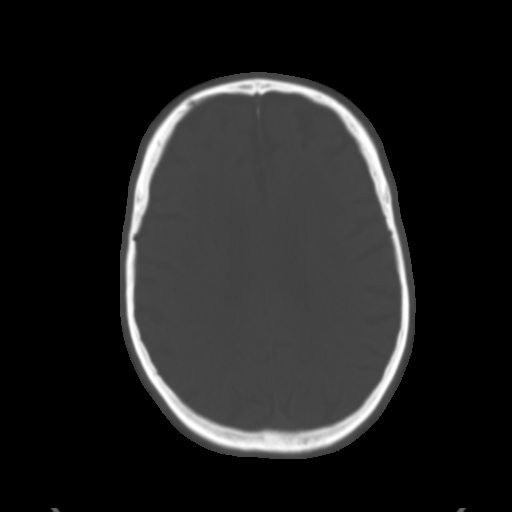
[im 25/34  brain]
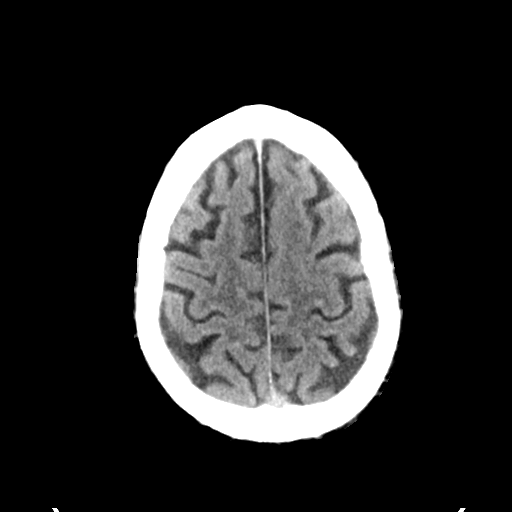
[im 29/34  brain]
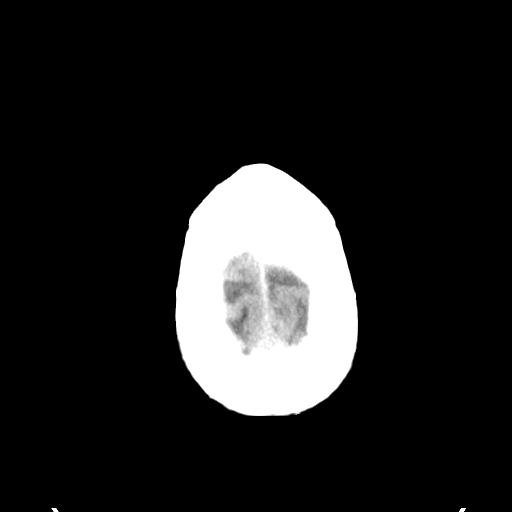

[Series 5: head without cor · coronal · non-contrast · 0.33mm/px · 3 of 77 slices shown]
[im 26/77  brain]
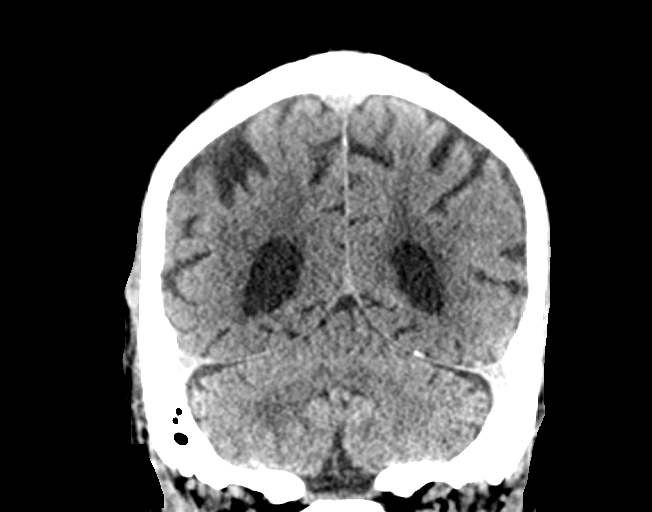
[im 34/77  brain]
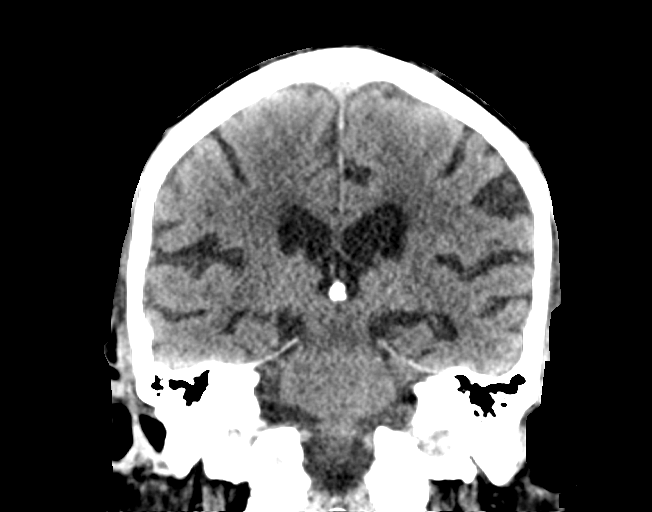
[im 43/77  brain]
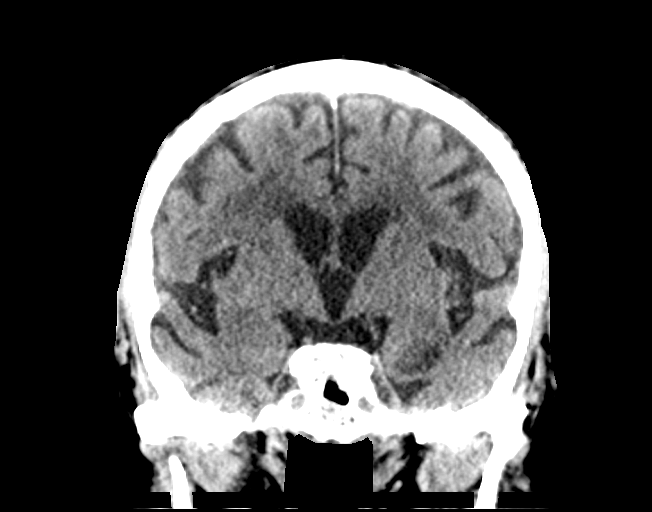

[Series 6: head without sag · sagittal · non-contrast · 0.33mm/px · 3 of 67 slices shown]
[im 23/67  brain]
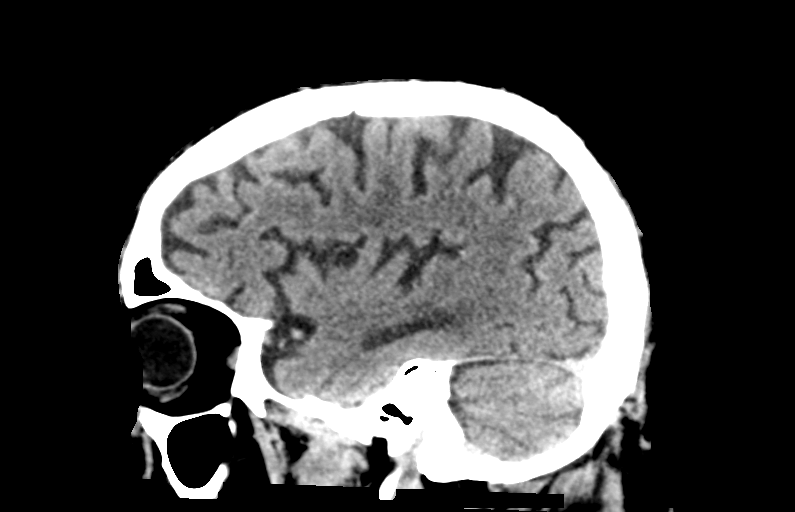
[im 34/67  brain]
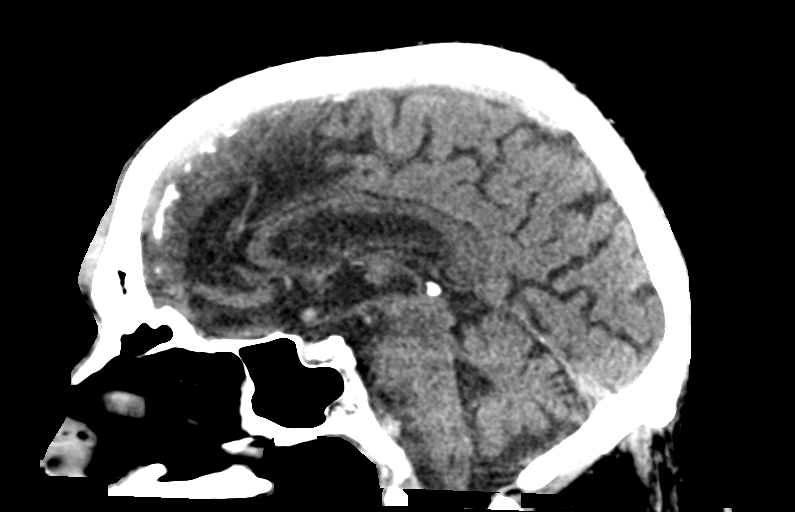
[im 45/67  brain]
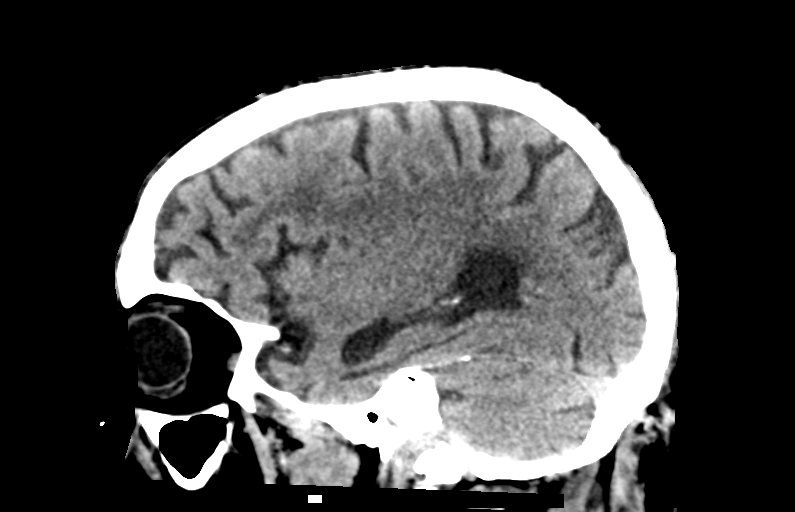

[13 of 47 positions shown; findings below may reference images not displayed]

FINDINGS: CT HEAD FINDINGS

Brain:

Stable, moderate generalized parenchymal atrophy.

Stable, moderate patchy hypoattenuation within the cerebral white
matter which is nonspecific, but consistent with chronic small
vessel ischemic disease.

There is no acute intracranial hemorrhage.

No demarcated cortical infarct.

No extra-axial fluid collection.

No evidence of intracranial mass.

No midline shift.

Vascular: No hyperdense vessel.  Atherosclerotic calcifications.

Skull: Normal. Negative for fracture or focal lesion.

Sinuses/Orbits: Visualized orbits show no acute finding. No
significant paranasal sinus disease or mastoid effusion at the
imaged levels.

Other: Sizable midline frontal scalp/forehead hematoma.

CT CERVICAL SPINE FINDINGS

Alignment: Straightening of the expected cervical lordosis. No
significant spondylolisthesis.

Skull base and vertebrae: The basion-dental and atlanto-dental
intervals are maintained. Sequela of prior C3-C6 ACDF. ACDF hardware
remains at the C4-C6 levels. No evidence of hardware compromise.
There is an acute fracture which extends through a C6-C7 ventral
enthesophyte and the anterior aspect of the C6-C7 disc space.
Nondisplaced fracture components also extend horizontally and
obliquely through the C7 vertebra (for instance as seen on series 9,
image 34) (series 10, images 30 [phone]).

Soft tissues and spinal canal: No prevertebral fluid or swelling. No
visible canal hematoma.

Disc levels:

Sequela of prior C3-C6 ACDF. Additionally, there are fusing bridging
osteophytes/enthesophytes anteriorly and posteriorly at C2-C3.
Multilevel fusing ventral osteophytes at the C7 and visualized more
caudal levels. Additionally, there is multilevel fusion of the
posterior elements.

Multilevel vertebral osteophytosis/ossification of the posterior
longitudinal ligament. There is at least moderate bony spinal canal
stenosis at C3-C4 and C6-C7. Multilevel bony neural foraminal
narrowing.

Upper chest: No consolidation within the imaged lung apices. No
visible pneumothorax.

These results were called by telephone at the time of interpretation
on 11/15/2019 at [DATE] to provider RYAN KENNETH RENTERIA , who verbally
acknowledged these results.
IMPRESSION: CT head:

1. No evidence of acute intracranial abnormality.
2. Sizable midline frontal scalp/forehead hematoma.
3. Stable moderate generalized parenchymal atrophy and chronic small
vessel ischemic disease.

CT cervical spine:

1. Acute fracture traversing a ventral C6-C7 enthesophyte and at
least portions of the C6-C7 disc space. Nondisplaced acute fractures
also extend horizontally and obliquely through the C7 vertebra
toward the inferior endplate. Given the appearance of these
fractures, there is high suspicion for ligamentous injury and MRI of
the cervical spine is recommended for further evaluation.
2. Sequela of prior ACDF at the C3-C6 levels. ACDF hardware remains
at the C4-C6 levels. No evidence of acute hardware compromise.
3. Superimposed cervical spondylosis and ossification of the
posterior longitudinal ligament. There is at least moderate bony
spinal canal stenosis at C3-C4 and C6-C7.
